# Patient Record
Sex: Female | Born: 1935 | Race: White | Hispanic: No | Marital: Married | Smoking: Never smoker
Health system: Southern US, Community
[De-identification: ages and names within clinical notes are randomized; demographics above are authoritative.]

## PROBLEM LIST (undated history)

## (undated) DIAGNOSIS — T7840XA Allergy, unspecified, initial encounter: Secondary | ICD-10-CM

## (undated) DIAGNOSIS — D649 Anemia, unspecified: Secondary | ICD-10-CM

## (undated) DIAGNOSIS — C50919 Malignant neoplasm of unspecified site of unspecified female breast: Secondary | ICD-10-CM

## (undated) DIAGNOSIS — Z923 Personal history of irradiation: Secondary | ICD-10-CM

## (undated) DIAGNOSIS — I10 Essential (primary) hypertension: Secondary | ICD-10-CM

## (undated) DIAGNOSIS — M8580 Other specified disorders of bone density and structure, unspecified site: Secondary | ICD-10-CM

## (undated) DIAGNOSIS — C801 Malignant (primary) neoplasm, unspecified: Secondary | ICD-10-CM

## (undated) DIAGNOSIS — K219 Gastro-esophageal reflux disease without esophagitis: Secondary | ICD-10-CM

## (undated) DIAGNOSIS — I5181 Takotsubo syndrome: Secondary | ICD-10-CM

## (undated) DIAGNOSIS — H3530 Unspecified macular degeneration: Secondary | ICD-10-CM

## (undated) DIAGNOSIS — E785 Hyperlipidemia, unspecified: Secondary | ICD-10-CM

## (undated) DIAGNOSIS — I219 Acute myocardial infarction, unspecified: Secondary | ICD-10-CM

## (undated) DIAGNOSIS — Z9289 Personal history of other medical treatment: Secondary | ICD-10-CM

## (undated) HISTORY — PX: APPENDECTOMY: SHX54

## (undated) HISTORY — DX: Allergy, unspecified, initial encounter: T78.40XA

## (undated) HISTORY — DX: Unspecified macular degeneration: H35.30

## (undated) HISTORY — DX: Essential (primary) hypertension: I10

## (undated) HISTORY — DX: Other specified disorders of bone density and structure, unspecified site: M85.80

## (undated) HISTORY — PX: CATARACT EXTRACTION: SUR2

## (undated) HISTORY — PX: EYE SURGERY: SHX253

## (undated) HISTORY — DX: Hyperlipidemia, unspecified: E78.5

## (undated) HISTORY — PX: RETINAL LASER PROCEDURE: SHX2339

## (undated) HISTORY — DX: Malignant (primary) neoplasm, unspecified: C80.1

## (undated) HISTORY — DX: Gastro-esophageal reflux disease without esophagitis: K21.9

---

## 1982-03-31 DIAGNOSIS — Z9289 Personal history of other medical treatment: Secondary | ICD-10-CM

## 1982-03-31 DIAGNOSIS — D649 Anemia, unspecified: Secondary | ICD-10-CM

## 1982-03-31 HISTORY — PX: ABDOMINAL HYSTERECTOMY: SHX81

## 1982-03-31 HISTORY — DX: Personal history of other medical treatment: Z92.89

## 1982-03-31 HISTORY — DX: Anemia, unspecified: D64.9

## 2000-03-04 ENCOUNTER — Encounter: Admission: RE | Admit: 2000-03-04 | Discharge: 2000-03-04 | Payer: Self-pay | Admitting: Family Medicine

## 2000-03-04 ENCOUNTER — Encounter: Payer: Self-pay | Admitting: Family Medicine

## 2001-03-05 ENCOUNTER — Encounter: Admission: RE | Admit: 2001-03-05 | Discharge: 2001-03-05 | Payer: Self-pay | Admitting: Family Medicine

## 2001-03-05 ENCOUNTER — Encounter: Payer: Self-pay | Admitting: Family Medicine

## 2002-03-07 ENCOUNTER — Encounter: Admission: RE | Admit: 2002-03-07 | Discharge: 2002-03-07 | Payer: Self-pay | Admitting: Family Medicine

## 2002-03-07 ENCOUNTER — Encounter: Payer: Self-pay | Admitting: Family Medicine

## 2003-03-10 ENCOUNTER — Encounter: Admission: RE | Admit: 2003-03-10 | Discharge: 2003-03-10 | Payer: Self-pay | Admitting: Family Medicine

## 2003-10-07 ENCOUNTER — Emergency Department (HOSPITAL_COMMUNITY): Admission: EM | Admit: 2003-10-07 | Discharge: 2003-10-07 | Payer: Self-pay | Admitting: Emergency Medicine

## 2003-10-09 ENCOUNTER — Emergency Department (HOSPITAL_COMMUNITY): Admission: EM | Admit: 2003-10-09 | Discharge: 2003-10-09 | Payer: Self-pay | Admitting: Emergency Medicine

## 2004-03-15 ENCOUNTER — Encounter: Admission: RE | Admit: 2004-03-15 | Discharge: 2004-03-15 | Payer: Self-pay | Admitting: Family Medicine

## 2004-03-27 ENCOUNTER — Encounter: Admission: RE | Admit: 2004-03-27 | Discharge: 2004-03-27 | Payer: Self-pay | Admitting: Family Medicine

## 2004-03-27 ENCOUNTER — Encounter (INDEPENDENT_AMBULATORY_CARE_PROVIDER_SITE_OTHER): Payer: Self-pay | Admitting: *Deleted

## 2004-03-31 HISTORY — PX: BREAST BIOPSY: SHX20

## 2004-04-02 ENCOUNTER — Encounter: Admission: RE | Admit: 2004-04-02 | Discharge: 2004-04-02 | Payer: Self-pay | Admitting: General Surgery

## 2004-04-17 ENCOUNTER — Encounter: Admission: RE | Admit: 2004-04-17 | Discharge: 2004-04-17 | Payer: Self-pay | Admitting: General Surgery

## 2004-04-19 ENCOUNTER — Ambulatory Visit (HOSPITAL_BASED_OUTPATIENT_CLINIC_OR_DEPARTMENT_OTHER): Admission: RE | Admit: 2004-04-19 | Discharge: 2004-04-19 | Payer: Self-pay | Admitting: General Surgery

## 2004-04-19 ENCOUNTER — Ambulatory Visit (HOSPITAL_COMMUNITY): Admission: RE | Admit: 2004-04-19 | Discharge: 2004-04-19 | Payer: Self-pay | Admitting: General Surgery

## 2004-04-19 ENCOUNTER — Encounter: Admission: RE | Admit: 2004-04-19 | Discharge: 2004-04-19 | Payer: Self-pay | Admitting: General Surgery

## 2004-04-19 ENCOUNTER — Encounter (INDEPENDENT_AMBULATORY_CARE_PROVIDER_SITE_OTHER): Payer: Self-pay | Admitting: Specialist

## 2004-04-19 HISTORY — PX: BREAST LUMPECTOMY: SHX2

## 2004-04-22 ENCOUNTER — Ambulatory Visit: Payer: Self-pay | Admitting: Oncology

## 2004-05-01 ENCOUNTER — Ambulatory Visit: Admission: RE | Admit: 2004-05-01 | Discharge: 2004-07-11 | Payer: Self-pay | Admitting: *Deleted

## 2004-06-25 ENCOUNTER — Ambulatory Visit: Payer: Self-pay | Admitting: Oncology

## 2004-07-19 ENCOUNTER — Other Ambulatory Visit: Admission: RE | Admit: 2004-07-19 | Discharge: 2004-07-19 | Payer: Self-pay | Admitting: Family Medicine

## 2004-07-24 ENCOUNTER — Ambulatory Visit: Admission: RE | Admit: 2004-07-24 | Discharge: 2004-07-24 | Payer: Self-pay | Admitting: *Deleted

## 2004-08-27 ENCOUNTER — Ambulatory Visit: Payer: Self-pay | Admitting: Oncology

## 2004-09-24 ENCOUNTER — Encounter: Admission: RE | Admit: 2004-09-24 | Discharge: 2004-09-24 | Payer: Self-pay | Admitting: Oncology

## 2004-12-30 ENCOUNTER — Ambulatory Visit: Payer: Self-pay | Admitting: Oncology

## 2005-03-31 HISTORY — PX: BREAST LUMPECTOMY: SHX2

## 2005-04-02 ENCOUNTER — Encounter: Admission: RE | Admit: 2005-04-02 | Discharge: 2005-04-02 | Payer: Self-pay | Admitting: Family Medicine

## 2005-04-02 ENCOUNTER — Encounter (INDEPENDENT_AMBULATORY_CARE_PROVIDER_SITE_OTHER): Payer: Self-pay | Admitting: *Deleted

## 2005-04-15 ENCOUNTER — Encounter: Admission: RE | Admit: 2005-04-15 | Discharge: 2005-04-15 | Payer: Self-pay | Admitting: General Surgery

## 2005-04-29 ENCOUNTER — Encounter: Admission: RE | Admit: 2005-04-29 | Discharge: 2005-04-29 | Payer: Self-pay | Admitting: Family Medicine

## 2005-04-29 ENCOUNTER — Encounter (INDEPENDENT_AMBULATORY_CARE_PROVIDER_SITE_OTHER): Payer: Self-pay | Admitting: Specialist

## 2005-04-29 ENCOUNTER — Ambulatory Visit (HOSPITAL_BASED_OUTPATIENT_CLINIC_OR_DEPARTMENT_OTHER): Admission: RE | Admit: 2005-04-29 | Discharge: 2005-04-29 | Payer: Self-pay | Admitting: General Surgery

## 2005-04-29 ENCOUNTER — Encounter (INDEPENDENT_AMBULATORY_CARE_PROVIDER_SITE_OTHER): Payer: Self-pay | Admitting: General Surgery

## 2005-04-29 HISTORY — PX: BREAST BIOPSY: SHX20

## 2005-05-13 ENCOUNTER — Ambulatory Visit: Admission: RE | Admit: 2005-05-13 | Discharge: 2005-08-01 | Payer: Self-pay | Admitting: *Deleted

## 2005-05-30 ENCOUNTER — Encounter: Admission: RE | Admit: 2005-05-30 | Discharge: 2005-05-30 | Payer: Self-pay | Admitting: *Deleted

## 2005-06-24 ENCOUNTER — Ambulatory Visit: Payer: Self-pay | Admitting: Oncology

## 2005-07-08 ENCOUNTER — Encounter: Admission: RE | Admit: 2005-07-08 | Discharge: 2005-07-08 | Payer: Self-pay | Admitting: Family Medicine

## 2005-08-24 ENCOUNTER — Ambulatory Visit: Payer: Self-pay | Admitting: Oncology

## 2005-12-19 ENCOUNTER — Ambulatory Visit: Payer: Self-pay | Admitting: Oncology

## 2005-12-23 LAB — CBC WITH DIFFERENTIAL/PLATELET
Basophils Absolute: 0 10*3/uL (ref 0.0–0.1)
Eosinophils Absolute: 0 10*3/uL (ref 0.0–0.5)
HCT: 36.7 % (ref 34.8–46.6)
HGB: 12.9 g/dL (ref 11.6–15.9)
MCH: 33.6 pg (ref 26.0–34.0)
MONO#: 0.3 10*3/uL (ref 0.1–0.9)
NEUT%: 71.9 % (ref 39.6–76.8)
WBC: 5.7 10*3/uL (ref 3.9–10.0)
lymph#: 1.2 10*3/uL (ref 0.9–3.3)

## 2005-12-23 LAB — COMPREHENSIVE METABOLIC PANEL
BUN: 11 mg/dL (ref 6–23)
CO2: 28 mEq/L (ref 19–32)
Calcium: 9.4 mg/dL (ref 8.4–10.5)
Chloride: 97 mEq/L (ref 96–112)
Creatinine, Ser: 0.68 mg/dL (ref 0.40–1.20)
Glucose, Bld: 127 mg/dL — ABNORMAL HIGH (ref 70–99)

## 2005-12-23 LAB — CANCER ANTIGEN 27.29: CA 27.29: 29 U/mL (ref 0–39)

## 2006-03-18 ENCOUNTER — Encounter: Admission: RE | Admit: 2006-03-18 | Discharge: 2006-03-18 | Payer: Self-pay | Admitting: Family Medicine

## 2006-06-30 ENCOUNTER — Ambulatory Visit (HOSPITAL_COMMUNITY): Admission: RE | Admit: 2006-06-30 | Discharge: 2006-06-30 | Payer: Self-pay | Admitting: Oncology

## 2006-07-31 ENCOUNTER — Ambulatory Visit: Payer: Self-pay | Admitting: Oncology

## 2006-08-04 LAB — COMPREHENSIVE METABOLIC PANEL
AST: 15 U/L (ref 0–37)
Albumin: 4.4 g/dL (ref 3.5–5.2)
BUN: 16 mg/dL (ref 6–23)
Calcium: 9.6 mg/dL (ref 8.4–10.5)
Chloride: 98 mEq/L (ref 96–112)
Potassium: 4.1 mEq/L (ref 3.5–5.3)
Sodium: 137 mEq/L (ref 135–145)
Total Protein: 7.3 g/dL (ref 6.0–8.3)

## 2006-08-04 LAB — CBC WITH DIFFERENTIAL/PLATELET
Basophils Absolute: 0 10*3/uL (ref 0.0–0.1)
EOS%: 0.9 % (ref 0.0–7.0)
Eosinophils Absolute: 0.1 10*3/uL (ref 0.0–0.5)
HGB: 13.2 g/dL (ref 11.6–15.9)
MCH: 34.1 pg — ABNORMAL HIGH (ref 26.0–34.0)
NEUT#: 3.5 10*3/uL (ref 1.5–6.5)
RBC: 3.87 10*6/uL (ref 3.70–5.32)
RDW: 12.6 % (ref 11.3–14.5)
lymph#: 1.5 10*3/uL (ref 0.9–3.3)

## 2007-03-29 ENCOUNTER — Encounter: Admission: RE | Admit: 2007-03-29 | Discharge: 2007-03-29 | Payer: Self-pay | Admitting: Family Medicine

## 2007-07-30 ENCOUNTER — Ambulatory Visit: Payer: Self-pay | Admitting: Oncology

## 2007-08-03 LAB — COMPREHENSIVE METABOLIC PANEL
AST: 17 U/L (ref 0–37)
BUN: 21 mg/dL (ref 6–23)
Calcium: 9.9 mg/dL (ref 8.4–10.5)
Chloride: 94 mEq/L — ABNORMAL LOW (ref 96–112)
Creatinine, Ser: 0.8 mg/dL (ref 0.40–1.20)

## 2007-08-03 LAB — CBC WITH DIFFERENTIAL/PLATELET
BASO%: 0.3 % (ref 0.0–2.0)
HCT: 40.3 % (ref 34.8–46.6)
LYMPH%: 13.3 % — ABNORMAL LOW (ref 14.0–48.0)
MCH: 33.3 pg (ref 26.0–34.0)
MCHC: 33.9 g/dL (ref 32.0–36.0)
MCV: 98.2 fL (ref 81.0–101.0)
MONO#: 0.6 10*3/uL (ref 0.1–0.9)
NEUT%: 81.4 % — ABNORMAL HIGH (ref 39.6–76.8)
Platelets: 339 10*3/uL (ref 145–400)
WBC: 13.4 10*3/uL — ABNORMAL HIGH (ref 3.9–10.0)

## 2008-03-29 ENCOUNTER — Encounter: Admission: RE | Admit: 2008-03-29 | Discharge: 2008-03-29 | Payer: Self-pay | Admitting: Oncology

## 2008-08-04 ENCOUNTER — Ambulatory Visit: Payer: Self-pay | Admitting: Oncology

## 2008-08-08 LAB — CBC WITH DIFFERENTIAL/PLATELET
EOS%: 0.8 % (ref 0.0–7.0)
MCH: 33.5 pg (ref 25.1–34.0)
MCV: 96.8 fL (ref 79.5–101.0)
MONO%: 6.1 % (ref 0.0–14.0)
NEUT#: 4.7 10*3/uL (ref 1.5–6.5)
RBC: 4.05 10*6/uL (ref 3.70–5.45)
RDW: 12.6 % (ref 11.2–14.5)
lymph#: 1.6 10*3/uL (ref 0.9–3.3)

## 2008-08-09 LAB — COMPREHENSIVE METABOLIC PANEL
ALT: 22 U/L (ref 0–35)
AST: 20 U/L (ref 0–37)
Albumin: 4.3 g/dL (ref 3.5–5.2)
Alkaline Phosphatase: 77 U/L (ref 39–117)
Potassium: 3.8 mEq/L (ref 3.5–5.3)
Sodium: 139 mEq/L (ref 135–145)
Total Protein: 6.9 g/dL (ref 6.0–8.3)

## 2009-04-02 ENCOUNTER — Encounter: Admission: RE | Admit: 2009-04-02 | Discharge: 2009-04-02 | Payer: Self-pay | Admitting: Oncology

## 2009-08-07 ENCOUNTER — Ambulatory Visit: Payer: Self-pay | Admitting: Oncology

## 2009-08-08 LAB — COMPREHENSIVE METABOLIC PANEL
ALT: 19 U/L (ref 0–35)
CO2: 25 mEq/L (ref 19–32)
Calcium: 9.6 mg/dL (ref 8.4–10.5)
Chloride: 96 mEq/L (ref 96–112)
Potassium: 3.5 mEq/L (ref 3.5–5.3)
Sodium: 136 mEq/L (ref 135–145)
Total Protein: 7.1 g/dL (ref 6.0–8.3)

## 2009-08-08 LAB — CBC WITH DIFFERENTIAL/PLATELET
BASO%: 0.4 % (ref 0.0–2.0)
MCHC: 34.4 g/dL (ref 31.5–36.0)
MONO#: 0.5 10*3/uL (ref 0.1–0.9)
RBC: 3.84 10*6/uL (ref 3.70–5.45)
WBC: 9.4 10*3/uL (ref 3.9–10.3)
lymph#: 1.8 10*3/uL (ref 0.9–3.3)

## 2009-08-08 LAB — CANCER ANTIGEN 27.29: CA 27.29: 29 U/mL (ref 0–39)

## 2009-10-22 ENCOUNTER — Emergency Department (HOSPITAL_COMMUNITY): Admission: EM | Admit: 2009-10-22 | Discharge: 2009-10-23 | Payer: Self-pay | Admitting: Emergency Medicine

## 2009-11-22 ENCOUNTER — Encounter: Admission: RE | Admit: 2009-11-22 | Discharge: 2009-11-22 | Payer: Self-pay | Admitting: Family Medicine

## 2010-04-08 ENCOUNTER — Encounter
Admission: RE | Admit: 2010-04-08 | Discharge: 2010-04-08 | Payer: No Typology Code available for payment source | Source: Home / Self Care | Attending: Family Medicine | Admitting: Family Medicine

## 2010-06-15 LAB — DIFFERENTIAL
Eosinophils Absolute: 0.1 10*3/uL (ref 0.0–0.7)
Lymphocytes Relative: 28 % (ref 12–46)
Lymphs Abs: 1.9 10*3/uL (ref 0.7–4.0)
Monocytes Relative: 7 % (ref 3–12)
Neutro Abs: 4.4 10*3/uL (ref 1.7–7.7)
Neutrophils Relative %: 64 % (ref 43–77)

## 2010-06-15 LAB — COMPREHENSIVE METABOLIC PANEL
ALT: 17 U/L (ref 0–35)
Calcium: 9.4 mg/dL (ref 8.4–10.5)
GFR calc Af Amer: >60 mL/min (ref >60)
Glucose, Bld: 99 mg/dL (ref 70–99)
Sodium: 138 mEq/L (ref 135–145)
Total Protein: 6.7 g/dL (ref 6.0–8.3)

## 2010-06-15 LAB — CBC
HCT: 35.5 % — ABNORMAL LOW (ref 36.0–46.0)
MCHC: 35 g/dL (ref 30.0–36.0)
RDW: 12.7 % (ref 11.5–15.5)

## 2010-06-15 LAB — MAGNESIUM: Magnesium: 1.8 mg/dL (ref 1.5–2.5)

## 2010-08-06 ENCOUNTER — Other Ambulatory Visit: Payer: Self-pay | Admitting: Oncology

## 2010-08-06 ENCOUNTER — Encounter (HOSPITAL_BASED_OUTPATIENT_CLINIC_OR_DEPARTMENT_OTHER): Payer: Medicare Other | Admitting: Oncology

## 2010-08-06 DIAGNOSIS — Z170 Estrogen receptor positive status [ER+]: Secondary | ICD-10-CM

## 2010-08-06 DIAGNOSIS — M949 Disorder of cartilage, unspecified: Secondary | ICD-10-CM

## 2010-08-06 DIAGNOSIS — C50219 Malignant neoplasm of upper-inner quadrant of unspecified female breast: Secondary | ICD-10-CM

## 2010-08-06 LAB — COMPREHENSIVE METABOLIC PANEL
ALT: 17 U/L (ref 0–35)
CO2: 32 mEq/L (ref 19–32)
Calcium: 10.1 mg/dL (ref 8.4–10.5)
Chloride: 98 mEq/L (ref 96–112)
Creatinine, Ser: 0.84 mg/dL (ref 0.40–1.20)
Glucose, Bld: 111 mg/dL — ABNORMAL HIGH (ref 70–99)
Total Protein: 7.1 g/dL (ref 6.0–8.3)

## 2010-08-06 LAB — CBC WITH DIFFERENTIAL/PLATELET
BASO%: 0.5 % (ref 0.0–2.0)
Eosinophils Absolute: 0 10*3/uL (ref 0.0–0.5)
HCT: 36.8 % (ref 34.8–46.6)
HGB: 12.5 g/dL (ref 11.6–15.9)
LYMPH%: 24.3 % (ref 14.0–49.7)
MCHC: 34 g/dL (ref 31.5–36.0)
MONO#: 0.4 10*3/uL (ref 0.1–0.9)
NEUT#: 4.4 10*3/uL (ref 1.5–6.5)
NEUT%: 67.7 % (ref 38.4–76.8)
Platelets: 249 10*3/uL (ref 145–400)
WBC: 6.5 10*3/uL (ref 3.9–10.3)
lymph#: 1.6 10*3/uL (ref 0.9–3.3)

## 2010-08-07 LAB — CANCER ANTIGEN 27.29: CA 27.29: 23 U/mL (ref 0–39)

## 2010-08-07 LAB — VITAMIN D 25 HYDROXY (VIT D DEFICIENCY, FRACTURES): Vit D, 25-Hydroxy: 54 ng/mL (ref 30–89)

## 2010-08-13 ENCOUNTER — Encounter (HOSPITAL_BASED_OUTPATIENT_CLINIC_OR_DEPARTMENT_OTHER): Payer: Medicare Other | Admitting: Oncology

## 2010-08-13 DIAGNOSIS — C50219 Malignant neoplasm of upper-inner quadrant of unspecified female breast: Secondary | ICD-10-CM

## 2010-08-13 DIAGNOSIS — Z170 Estrogen receptor positive status [ER+]: Secondary | ICD-10-CM

## 2010-08-13 DIAGNOSIS — D0590 Unspecified type of carcinoma in situ of unspecified breast: Secondary | ICD-10-CM

## 2010-08-16 NOTE — Op Note (Signed)
[name], [name]           ACCOUNT NO.:  [id number]   MEDICAL RECORD NO.:  [id number]          PATIENT TYPE:  AMB   LOCATION:  DSC                          FACILITY:  [facility]   PHYSICIAN:  Rose Phi. Young, M.D.   DATE OF BIRTH:  01-09-1935   DATE OF PROCEDURE:  04/29/2005  DATE OF DISCHARGE:                                 OPERATIVE REPORT   PREOPERATIVE DIAGNOSIS:  Atypical ductal hyperplasia of the right breast and  a previous history of left breast cancer.   POSTOPERATIVE DIAGNOSIS:  Atypical ductal hyperplasia of the right breast  and a previous history of left breast cancer.   OPERATION:  Right breast biopsy with needle localization and specimen  mammogram.   SURGEON:  Rose Phi. Maple Hudson, M.D.   ANESTHESIA:  MAC.   OPERATIVE PROCEDURE:  Prior to coming to the operating room, a localizing  wire had been placed in the upper outer quadrant of the right breast.  She  was then placed on the operating table with her arms extended on the arm  board and the right breast prepped and draped in the usual fashion.  A  curvilinear incision was outlined with the marking pencil and the area  thoroughly infiltrated with the local anesthetic mixture.  The incision was  made with the wire delivered into the incision and then the wire and  surrounding tissue were excised.  Specimen mammogram confirmed the removal  of the lesion.  Hemostasis was obtained with the cautery.  Subcuticular  closure of 4-0 Monocryl and Steri-Strips carried out.  Dressing applied.  The patient transferred to the recovery room in good condition.      Rose Phi. Maple Hudson, M.D.  Electronically Signed     PRY/MEDQ  D:  04/29/2005  T:  04/29/2005  Job:  [id number]

## 2010-08-16 NOTE — Op Note (Signed)
Melinda [name], Melinda [name]           ACCOUNT NO.:  [id number]   MEDICAL RECORD NO.:  [id number]          PATIENT TYPE:  AMB   LOCATION:  DSC                          FACILITY:  [facility]   PHYSICIAN:  Rose Phi. Maple Hudson, M.D.   DATE OF BIRTH:  1934/04/15   DATE OF PROCEDURE:  04/19/2004  DATE OF DISCHARGE:                                 OPERATIVE REPORT   PREOPERATIVE DIAGNOSIS:  Stage 1 carcinoma of the left breast.   POSTOPERATIVE DIAGNOSIS:  Stage 1 carcinoma of the left breast.   OPERATION:  1.  Blue dye injection.  2.  Left partial mastectomy with needle localization and specimen mammogram.  3.  Left axillary sentinel node biopsy.   SURGEON:  Rose Phi. Maple Hudson, M.D.   ANESTHESIA:  General.   OPERATIVE PROCEDURE:  Prior to coming to the operating room a localizing  wire was placed to identify the known malignancy throughout blocked position  of the left breast.  In addition, 1 mc of technetium sulfur colloid was  injected intradermally.   After suitable general anesthesia was induced, the patient was placed in a  supine position with the arms extended on the arm board.  Then 5 cc of a  mixture of 2 cc methylene blue and 3 cc of saline was injected in the  subareolar tissue, and the breast gently massaged for three minutes.  We  then prepped and draped the breast and the axilla.   A curved incision in the upper part of the breast, using the previously  placed wire as a guide, was then made.  A wide excision of the wire and  surrounding tissue was carried out.  Hemostasis was obtained with  electrocautery.  Specimen was oriented for the pathologist and then  submitted for a specimen mammogram, to be followed by evaluation for  margins.   While that was being done, a transverse left axillary incision was made,  with dissection through the subcutaneous tissue to the clavipectoral fascia.  Deep to the fascia was a blue and hot lymph node, which was very high  counts; which we removed  as the sentinel node.  There were no other blue,  hot or palpable nodes.  That node was also submitted to the pathologist for  evaluation for metastatic disease.   The specimen mammogram confirmed the removal of the lesion and touch prep on  the margins of the specimen were negative.  In addition, touch prep for the  sentinel node was also negative.   With that information, both incisions were injected with 0.25% Marcaine, and  then closed in two layers with 3-0 Vicryl and subcuticular 4-0 Monocryl and  Steri-Strips.  Dressings were then applied.   The patient transferred to the recovery room in satisfactory condition,  having tolerated the procedure well.      Pete   PRY/MEDQ  D:  04/19/2004  T:  04/20/2004  Job:  [id number]

## 2010-08-23 ENCOUNTER — Encounter (INDEPENDENT_AMBULATORY_CARE_PROVIDER_SITE_OTHER): Payer: Self-pay | Admitting: Surgery

## 2011-01-27 ENCOUNTER — Encounter (INDEPENDENT_AMBULATORY_CARE_PROVIDER_SITE_OTHER): Payer: Self-pay | Admitting: Surgery

## 2011-02-19 ENCOUNTER — Encounter (INDEPENDENT_AMBULATORY_CARE_PROVIDER_SITE_OTHER): Payer: Self-pay | Admitting: General Surgery

## 2011-02-19 DIAGNOSIS — Z853 Personal history of malignant neoplasm of breast: Secondary | ICD-10-CM | POA: Insufficient documentation

## 2011-02-27 ENCOUNTER — Ambulatory Visit (INDEPENDENT_AMBULATORY_CARE_PROVIDER_SITE_OTHER): Payer: Medicare Other | Admitting: Surgery

## 2011-02-27 ENCOUNTER — Encounter (INDEPENDENT_AMBULATORY_CARE_PROVIDER_SITE_OTHER): Payer: Self-pay | Admitting: Surgery

## 2011-02-27 VITALS — BP 128/74 | HR 100 | Temp 97.2°F | Resp 16 | Ht 62.0 in | Wt 192.2 lb

## 2011-02-27 DIAGNOSIS — Z853 Personal history of malignant neoplasm of breast: Secondary | ICD-10-CM

## 2011-02-27 NOTE — Patient Instructions (Signed)
Come back if you have any problems with your breasts. Continue annual mammograms

## 2011-02-27 NOTE — Progress Notes (Signed)
NAME: Melinda [name]       DOB: Feb 21, 1935           DATE: 02/27/2011       MRN: [id number]   Melinda [name] is a 76 y.o.Marland Kitchenfemale who presents for routine followup of her Bilateral breast cancers diagnosed in 2006  and treated with Lumpectomies, radiation and antiestrogen. She has no problems or concerns on either side.  PFSH: She has had no significant changes since the last visit here.  ROS: There have been no significant changes since the last visit here  EXAM: General: The patient is alert, oriented, generally healty appearing, NAD. Mood and affect are normal.  Breasts:  S/P bilateral lumpectomies with some loss of tissue at the lumpectomy sites, but no areas of suspicion  Lymphatics: She has no axillary or supraclavicular adenopathy on either side.  Extremities: Full ROM of the surgical side with no lymphedema noted.  Data Reviewed: Mammogram in Jan 2012 was negative  Impression: Doing well, with no evidence of recurrent cancer or new cancer  Plan: RTC PRN

## 2011-03-13 ENCOUNTER — Other Ambulatory Visit: Payer: Self-pay | Admitting: Family Medicine

## 2011-03-13 DIAGNOSIS — C50919 Malignant neoplasm of unspecified site of unspecified female breast: Secondary | ICD-10-CM

## 2011-03-13 DIAGNOSIS — Z9889 Other specified postprocedural states: Secondary | ICD-10-CM

## 2011-04-02 DIAGNOSIS — IMO0002 Reserved for concepts with insufficient information to code with codable children: Secondary | ICD-10-CM | POA: Diagnosis not present

## 2011-04-08 DIAGNOSIS — IMO0002 Reserved for concepts with insufficient information to code with codable children: Secondary | ICD-10-CM | POA: Diagnosis not present

## 2011-04-10 ENCOUNTER — Ambulatory Visit
Admission: RE | Admit: 2011-04-10 | Discharge: 2011-04-10 | Disposition: A | Discharge status: Home / Self Care | Payer: Medicare Other | Source: Ambulatory Visit | Attending: Family Medicine | Admitting: Family Medicine

## 2011-04-10 DIAGNOSIS — C50919 Malignant neoplasm of unspecified site of unspecified female breast: Secondary | ICD-10-CM

## 2011-04-10 DIAGNOSIS — Z853 Personal history of malignant neoplasm of breast: Secondary | ICD-10-CM | POA: Diagnosis not present

## 2011-04-10 DIAGNOSIS — Z9889 Other specified postprocedural states: Secondary | ICD-10-CM

## 2011-04-15 DIAGNOSIS — IMO0002 Reserved for concepts with insufficient information to code with codable children: Secondary | ICD-10-CM | POA: Diagnosis not present

## 2011-04-21 DIAGNOSIS — IMO0002 Reserved for concepts with insufficient information to code with codable children: Secondary | ICD-10-CM | POA: Diagnosis not present

## 2011-04-22 DIAGNOSIS — M25569 Pain in unspecified knee: Secondary | ICD-10-CM | POA: Diagnosis not present

## 2011-04-22 DIAGNOSIS — M1710 Unilateral primary osteoarthritis, unspecified knee: Secondary | ICD-10-CM | POA: Diagnosis not present

## 2011-04-28 DIAGNOSIS — IMO0002 Reserved for concepts with insufficient information to code with codable children: Secondary | ICD-10-CM | POA: Diagnosis not present

## 2011-04-30 DIAGNOSIS — H353290 Exudative age-related macular degeneration, unspecified eye, stage unspecified: Secondary | ICD-10-CM | POA: Diagnosis not present

## 2011-04-30 DIAGNOSIS — H35059 Retinal neovascularization, unspecified, unspecified eye: Secondary | ICD-10-CM | POA: Diagnosis not present

## 2011-05-20 DIAGNOSIS — J209 Acute bronchitis, unspecified: Secondary | ICD-10-CM | POA: Diagnosis not present

## 2011-06-05 DIAGNOSIS — H353290 Exudative age-related macular degeneration, unspecified eye, stage unspecified: Secondary | ICD-10-CM | POA: Diagnosis not present

## 2011-06-05 DIAGNOSIS — H35059 Retinal neovascularization, unspecified, unspecified eye: Secondary | ICD-10-CM | POA: Diagnosis not present

## 2011-07-08 DIAGNOSIS — H353290 Exudative age-related macular degeneration, unspecified eye, stage unspecified: Secondary | ICD-10-CM | POA: Diagnosis not present

## 2011-07-08 DIAGNOSIS — H35059 Retinal neovascularization, unspecified, unspecified eye: Secondary | ICD-10-CM | POA: Diagnosis not present

## 2011-08-13 DIAGNOSIS — H353290 Exudative age-related macular degeneration, unspecified eye, stage unspecified: Secondary | ICD-10-CM | POA: Diagnosis not present

## 2011-08-13 DIAGNOSIS — H35059 Retinal neovascularization, unspecified, unspecified eye: Secondary | ICD-10-CM | POA: Diagnosis not present

## 2011-08-22 DIAGNOSIS — M5430 Sciatica, unspecified side: Secondary | ICD-10-CM | POA: Diagnosis not present

## 2011-08-22 DIAGNOSIS — D2330 Other benign neoplasm of skin of unspecified part of face: Secondary | ICD-10-CM | POA: Diagnosis not present

## 2011-08-22 DIAGNOSIS — I10 Essential (primary) hypertension: Secondary | ICD-10-CM | POA: Diagnosis not present

## 2011-08-22 DIAGNOSIS — E7800 Pure hypercholesterolemia, unspecified: Secondary | ICD-10-CM | POA: Diagnosis not present

## 2011-09-17 DIAGNOSIS — H353290 Exudative age-related macular degeneration, unspecified eye, stage unspecified: Secondary | ICD-10-CM | POA: Diagnosis not present

## 2011-09-17 DIAGNOSIS — H35059 Retinal neovascularization, unspecified, unspecified eye: Secondary | ICD-10-CM | POA: Diagnosis not present

## 2011-10-06 DIAGNOSIS — H353190 Nonexudative age-related macular degeneration, unspecified eye, stage unspecified: Secondary | ICD-10-CM | POA: Diagnosis not present

## 2011-10-06 DIAGNOSIS — H35359 Cystoid macular degeneration, unspecified eye: Secondary | ICD-10-CM | POA: Diagnosis not present

## 2011-10-06 DIAGNOSIS — H35039 Hypertensive retinopathy, unspecified eye: Secondary | ICD-10-CM | POA: Diagnosis not present

## 2011-10-06 DIAGNOSIS — H4011X0 Primary open-angle glaucoma, stage unspecified: Secondary | ICD-10-CM | POA: Diagnosis not present

## 2011-10-06 DIAGNOSIS — H409 Unspecified glaucoma: Secondary | ICD-10-CM | POA: Diagnosis not present

## 2011-10-23 DIAGNOSIS — H353290 Exudative age-related macular degeneration, unspecified eye, stage unspecified: Secondary | ICD-10-CM | POA: Diagnosis not present

## 2011-10-23 DIAGNOSIS — H35059 Retinal neovascularization, unspecified, unspecified eye: Secondary | ICD-10-CM | POA: Diagnosis not present

## 2011-11-12 DIAGNOSIS — H01119 Allergic dermatitis of unspecified eye, unspecified eyelid: Secondary | ICD-10-CM | POA: Diagnosis not present

## 2011-11-26 DIAGNOSIS — H01119 Allergic dermatitis of unspecified eye, unspecified eyelid: Secondary | ICD-10-CM | POA: Diagnosis not present

## 2011-12-04 DIAGNOSIS — H353290 Exudative age-related macular degeneration, unspecified eye, stage unspecified: Secondary | ICD-10-CM | POA: Diagnosis not present

## 2011-12-04 DIAGNOSIS — H35059 Retinal neovascularization, unspecified, unspecified eye: Secondary | ICD-10-CM | POA: Diagnosis not present

## 2011-12-25 DIAGNOSIS — M1710 Unilateral primary osteoarthritis, unspecified knee: Secondary | ICD-10-CM | POA: Diagnosis not present

## 2011-12-25 DIAGNOSIS — M25559 Pain in unspecified hip: Secondary | ICD-10-CM | POA: Diagnosis not present

## 2011-12-25 DIAGNOSIS — M25569 Pain in unspecified knee: Secondary | ICD-10-CM | POA: Diagnosis not present

## 2012-01-14 DIAGNOSIS — H35059 Retinal neovascularization, unspecified, unspecified eye: Secondary | ICD-10-CM | POA: Diagnosis not present

## 2012-01-14 DIAGNOSIS — H353290 Exudative age-related macular degeneration, unspecified eye, stage unspecified: Secondary | ICD-10-CM | POA: Diagnosis not present

## 2012-01-15 DIAGNOSIS — Z23 Encounter for immunization: Secondary | ICD-10-CM | POA: Diagnosis not present

## 2012-02-10 ENCOUNTER — Other Ambulatory Visit: Payer: Self-pay

## 2012-02-10 DIAGNOSIS — D485 Neoplasm of uncertain behavior of skin: Secondary | ICD-10-CM | POA: Diagnosis not present

## 2012-02-10 DIAGNOSIS — D239 Other benign neoplasm of skin, unspecified: Secondary | ICD-10-CM | POA: Diagnosis not present

## 2012-02-10 DIAGNOSIS — D1801 Hemangioma of skin and subcutaneous tissue: Secondary | ICD-10-CM | POA: Diagnosis not present

## 2012-02-10 DIAGNOSIS — L719 Rosacea, unspecified: Secondary | ICD-10-CM | POA: Diagnosis not present

## 2012-02-10 DIAGNOSIS — D1739 Benign lipomatous neoplasm of skin and subcutaneous tissue of other sites: Secondary | ICD-10-CM | POA: Diagnosis not present

## 2012-02-10 DIAGNOSIS — L821 Other seborrheic keratosis: Secondary | ICD-10-CM | POA: Diagnosis not present

## 2012-02-10 DIAGNOSIS — B353 Tinea pedis: Secondary | ICD-10-CM | POA: Diagnosis not present

## 2012-02-10 DIAGNOSIS — B351 Tinea unguium: Secondary | ICD-10-CM | POA: Diagnosis not present

## 2012-02-18 DIAGNOSIS — H353290 Exudative age-related macular degeneration, unspecified eye, stage unspecified: Secondary | ICD-10-CM | POA: Diagnosis not present

## 2012-02-18 DIAGNOSIS — H3570 Unspecified separation of retinal layers: Secondary | ICD-10-CM | POA: Diagnosis not present

## 2012-02-18 DIAGNOSIS — H35059 Retinal neovascularization, unspecified, unspecified eye: Secondary | ICD-10-CM | POA: Diagnosis not present

## 2012-02-23 DIAGNOSIS — M949 Disorder of cartilage, unspecified: Secondary | ICD-10-CM | POA: Diagnosis not present

## 2012-02-23 DIAGNOSIS — J301 Allergic rhinitis due to pollen: Secondary | ICD-10-CM | POA: Diagnosis not present

## 2012-02-23 DIAGNOSIS — H409 Unspecified glaucoma: Secondary | ICD-10-CM | POA: Diagnosis not present

## 2012-02-23 DIAGNOSIS — Z0000 Encounter for general adult medical examination without abnormal findings: Secondary | ICD-10-CM | POA: Diagnosis not present

## 2012-02-23 DIAGNOSIS — Z853 Personal history of malignant neoplasm of breast: Secondary | ICD-10-CM | POA: Diagnosis not present

## 2012-02-23 DIAGNOSIS — E7800 Pure hypercholesterolemia, unspecified: Secondary | ICD-10-CM | POA: Diagnosis not present

## 2012-02-23 DIAGNOSIS — K219 Gastro-esophageal reflux disease without esophagitis: Secondary | ICD-10-CM | POA: Diagnosis not present

## 2012-02-23 DIAGNOSIS — I10 Essential (primary) hypertension: Secondary | ICD-10-CM | POA: Diagnosis not present

## 2012-02-23 DIAGNOSIS — M899 Disorder of bone, unspecified: Secondary | ICD-10-CM | POA: Diagnosis not present

## 2012-02-25 DIAGNOSIS — M949 Disorder of cartilage, unspecified: Secondary | ICD-10-CM | POA: Diagnosis not present

## 2012-02-25 DIAGNOSIS — M899 Disorder of bone, unspecified: Secondary | ICD-10-CM | POA: Diagnosis not present

## 2012-03-11 ENCOUNTER — Other Ambulatory Visit: Payer: Self-pay | Admitting: Family Medicine

## 2012-03-11 DIAGNOSIS — Z9889 Other specified postprocedural states: Secondary | ICD-10-CM

## 2012-03-11 DIAGNOSIS — Z853 Personal history of malignant neoplasm of breast: Secondary | ICD-10-CM

## 2012-03-30 DIAGNOSIS — H353290 Exudative age-related macular degeneration, unspecified eye, stage unspecified: Secondary | ICD-10-CM | POA: Diagnosis not present

## 2012-03-30 DIAGNOSIS — H35059 Retinal neovascularization, unspecified, unspecified eye: Secondary | ICD-10-CM | POA: Diagnosis not present

## 2012-04-12 ENCOUNTER — Ambulatory Visit
Admission: RE | Admit: 2012-04-12 | Discharge: 2012-04-12 | Disposition: A | Discharge status: Home / Self Care | Payer: Medicare Other | Source: Ambulatory Visit | Attending: Family Medicine | Admitting: Family Medicine

## 2012-04-12 DIAGNOSIS — Z9889 Other specified postprocedural states: Secondary | ICD-10-CM

## 2012-04-12 DIAGNOSIS — Z853 Personal history of malignant neoplasm of breast: Secondary | ICD-10-CM | POA: Diagnosis not present

## 2012-04-20 DIAGNOSIS — H43829 Vitreomacular adhesion, unspecified eye: Secondary | ICD-10-CM | POA: Diagnosis not present

## 2012-05-11 DIAGNOSIS — H35059 Retinal neovascularization, unspecified, unspecified eye: Secondary | ICD-10-CM | POA: Diagnosis not present

## 2012-05-11 DIAGNOSIS — H353290 Exudative age-related macular degeneration, unspecified eye, stage unspecified: Secondary | ICD-10-CM | POA: Diagnosis not present

## 2012-05-11 DIAGNOSIS — H3570 Unspecified separation of retinal layers: Secondary | ICD-10-CM | POA: Diagnosis not present

## 2012-06-22 DIAGNOSIS — H353290 Exudative age-related macular degeneration, unspecified eye, stage unspecified: Secondary | ICD-10-CM | POA: Diagnosis not present

## 2012-06-22 DIAGNOSIS — H3570 Unspecified separation of retinal layers: Secondary | ICD-10-CM | POA: Diagnosis not present

## 2012-06-22 DIAGNOSIS — H35059 Retinal neovascularization, unspecified, unspecified eye: Secondary | ICD-10-CM | POA: Diagnosis not present

## 2012-06-22 DIAGNOSIS — H35359 Cystoid macular degeneration, unspecified eye: Secondary | ICD-10-CM | POA: Diagnosis not present

## 2012-08-16 DIAGNOSIS — H35059 Retinal neovascularization, unspecified, unspecified eye: Secondary | ICD-10-CM | POA: Diagnosis not present

## 2012-08-16 DIAGNOSIS — H353290 Exudative age-related macular degeneration, unspecified eye, stage unspecified: Secondary | ICD-10-CM | POA: Diagnosis not present

## 2012-08-16 DIAGNOSIS — H35359 Cystoid macular degeneration, unspecified eye: Secondary | ICD-10-CM | POA: Diagnosis not present

## 2012-08-24 DIAGNOSIS — I10 Essential (primary) hypertension: Secondary | ICD-10-CM | POA: Diagnosis not present

## 2012-08-24 DIAGNOSIS — R079 Chest pain, unspecified: Secondary | ICD-10-CM | POA: Diagnosis not present

## 2012-08-24 DIAGNOSIS — E7800 Pure hypercholesterolemia, unspecified: Secondary | ICD-10-CM | POA: Diagnosis not present

## 2012-09-06 ENCOUNTER — Ambulatory Visit: Payer: Medicare Other | Admitting: Cardiology

## 2012-09-08 ENCOUNTER — Ambulatory Visit (INDEPENDENT_AMBULATORY_CARE_PROVIDER_SITE_OTHER): Payer: Medicare Other | Admitting: Cardiovascular Disease

## 2012-09-08 ENCOUNTER — Encounter: Payer: Self-pay | Admitting: Cardiovascular Disease

## 2012-09-08 ENCOUNTER — Encounter (HOSPITAL_COMMUNITY): Payer: Self-pay | Admitting: Cardiovascular Disease

## 2012-09-08 VITALS — BP 142/80 | HR 66 | Resp 16 | Ht 63.0 in | Wt 191.9 lb

## 2012-09-08 DIAGNOSIS — Z853 Personal history of malignant neoplasm of breast: Secondary | ICD-10-CM

## 2012-09-08 DIAGNOSIS — E785 Hyperlipidemia, unspecified: Secondary | ICD-10-CM

## 2012-09-08 DIAGNOSIS — R079 Chest pain, unspecified: Secondary | ICD-10-CM

## 2012-09-08 DIAGNOSIS — K219 Gastro-esophageal reflux disease without esophagitis: Secondary | ICD-10-CM

## 2012-09-08 DIAGNOSIS — I209 Angina pectoris, unspecified: Secondary | ICD-10-CM | POA: Diagnosis not present

## 2012-09-08 DIAGNOSIS — I10 Essential (primary) hypertension: Secondary | ICD-10-CM

## 2012-09-08 NOTE — Patient Instructions (Addendum)
Marland KitchenLexiscan Stress Electrocardiography Your caregiver has ordered a Lexiscan Stress Test with nuclear imaging. The purpose of this test is to evaluate the blood supply to your heart muscle. This procedure is referred to as a "Non-Invasive Stress Test." This is because other than having an IV started in your vein, nothing is inserted or "invades" your body. Cardiac stress tests are done to find areas of poor blood flow to the heart by determining the extent of coronary artery disease (CAD). Some patients exercise on a treadmill, which naturally increases the blood flow to your heart. However, almost half of the patients undergoing a treadmill stress test each year are unable to exercise adequately. This is due to various medical conditions. For these patients, a pharmacologic/chemical stress agent called Eugenie Birks is used on patients without a history of asthma. This medicine will mimic walking on a treadmill by temporarily increasing your coronary blood flow. The side effects of this medicine include:  Headache.  Dizziness.  Shortness of breath.  Flushing.  Chest pain/pressure.  Feeling sick to your stomach (nauseous).  Increased heart rate.  Abdominal discomfort.  Low blood pressure. BEFORE THE PROCEDURE   Avoid all forms of caffeine 24 hours before your test. This includes coffee, tea (even decaffeinated brands), caffeinated sodas, chocolate, cocoa and certain pain medications that contain caffeine.  Do not eat anything 6 hours before the test. In hospitalized patients, this means nothing by mouth after midnight.  Patients with diabetes that are not hospitalized (outpatients) should talk to their caregiver about how much, if any, insulin or oral diabetic medications they should take the day of the test. You should also talk about the type of light snack that you should eat the morning of the test.  Patients with diabetes that are hospitalized (inpatients) will follow the cardiologist's  written instructions that will be given to you.  Outpatients should bring a snack. Use the hospital vending machines so that you may eat right after the stress phase.  Wear comfortable shoes. There is at least an hour wait before the stress phase of nuclear scanning is done. The total procedure time is approximately 3 hours.  The following medications should be stopped 24 hours before your stress test: Beta-blockers and nitroglycerine (patches or paste should be removed 4 hours before the test).  Women, tell your caregiver if there is any possibility that you may be pregnant or if you are currently breastfeeding. PROCEDURE  There are two separate nuclear images taken using a nuclear camera. These images require a small dose of a radiotracer called an isotope. Most diagnostic nuclear medicine procedures result in low radiation exposure. Allergic reactions to the isotope may include slight redness going up the arm and pain during the injection. However, these reactions are extremely rare and usually mild. Eugenie Birks is given very rapidly over 10-15 seconds in the vein (intravenously) followed by a nuclear isotope. The medication causes the arteries in your heart to widen (dilate), and the nuclear isotope lights up those arteries so that the nuclear images are clear. Together, this shows whether the coronary blood flow is normal or abnormal. During this stress phase, you will be connected to an EKG machine. Your blood pressure and oxygen levels will be monitored by the cardiologist and cardiac nurse. This part usually lasts 5-10 minutes. For inpatients, the procedure is done in the patient's room. For outpatients, the procedure is done in the stress lab.  The "Resting Phase" is done before the Lexiscan injection and shows how your heart  functions at rest. The "Stress Phase" is done about 1 hour after the Lexiscan injection and determines how your heart functions under stress.  LEXISCAN STRESS TEST IS USEFUL  FOR DETERMINING:  The adequacy of blood flow to your heart during increased levels of activity in order to clear you for discharge home.  The extent of coronary artery blockage caused by CAD.  Your prognosis if you have suffered a heart attack.  The effectiveness of cardiac procedures done, such as an angioplasty, which can increase the circulation in your coronary arteries.  Cause(s) of chest pain/pressure. Finding out the results of your test  Ask when your test results will be ready. Make sure you get your test results. Document Released: 08/03/2008 Document Revised: 06/09/2011 Document Reviewed: 08/03/2008 [facility] Patient Information 2014 [place], [place].   Your physician recommends that you schedule a follow-up appointment in: AS NEEDED

## 2012-09-09 DIAGNOSIS — K219 Gastro-esophageal reflux disease without esophagitis: Secondary | ICD-10-CM | POA: Insufficient documentation

## 2012-09-09 DIAGNOSIS — R079 Chest pain, unspecified: Secondary | ICD-10-CM | POA: Insufficient documentation

## 2012-09-09 DIAGNOSIS — I10 Essential (primary) hypertension: Secondary | ICD-10-CM | POA: Insufficient documentation

## 2012-09-09 DIAGNOSIS — E7800 Pure hypercholesterolemia, unspecified: Secondary | ICD-10-CM | POA: Insufficient documentation

## 2012-09-09 NOTE — Assessment & Plan Note (Signed)
Her symptoms could then fully represent angina pectoris and at least one episode happened with greater than usual physical activity. On the other hand she has previously attributed similar symptoms to gastroesophageal reflux disease. She has intermediate coronary risk factor profile. I have recommended that she undergo a stress test. Unfortunately she states that her "bad knees" will not let her exercise treadmill. She'll be scheduled for a [employer]. If the study is normal I would focus my attention on possible gas esophageal source of her symptoms. If the study shows ischemia she should undergo coronary angiography, especially since she has had similar symptoms at night.

## 2012-09-09 NOTE — Assessment & Plan Note (Signed)
No history of chest radiation therapy.

## 2012-09-09 NOTE — Assessment & Plan Note (Addendum)
She is on a reasonable dose of a highly active statin. Very high HDL level should be protected. LDL and total cholesterol are also within the desirable range.

## 2012-09-09 NOTE — Progress Notes (Signed)
Patient ID: ARRIONNA SERENA, female   DOB: 04-08-1934, 78 y.o.   MRN: [id number]     Reason for office visit Chest pain  Mrs. Dastrup's husband is also in our patients. She does not have known cardiac disease in the past, but presents today for evaluation of chest pain. Her first episode occurred while she was walking up hill at a faster than usual pace, into place which she considers to be high-altitude ([place], [place]). The discomfort was described as mid retrosternal heaviness associated with discomfort in her lower jaw. She states that she had attributed similar complaints in the past to esophageal reflux. The symptoms subsided when she rested for about 5 minutes and did not recur when she started walking again at a slower pace. However a similar event occurred later in the middle of the night and woke her from sleep, again lasting about 5 minutes. Since that time no new occurrences.  She is self described as being very sedentary. She blames this on "bad knees". She has systemic hypertension and hyperlipidemia both of which are reportedly well controlled with pharmacological means. She does not have diabetes mellitus and has never smoked. There does not appear to be a strong family history of coronary artery disease except advanced ages.  Her electrocardiogram is normal.    Allergies  Allergen Reactions  . Nsaids Other (See Comments)    Extreme hives and difficulty breathing. "Has had to go to the ER."  . Salicylates Other (See Comments)    Extreme hives and difficulty breathing. "Has had to go to the ER."    Current Outpatient Prescriptions  Medication Sig Dispense Refill  . Acetaminophen (TYLENOL EXTRA STRENGTH PO) Take 2 tablets by mouth daily.      Marland Kitchen atorvastatin (LIPITOR) 20 MG tablet Take 20 mg by mouth daily.        . Brimonidine Tartrate (ALPHAGAN P OP) Apply to eye 2 (two) times daily.        . fexofenadine (ALLEGRA) 180 MG tablet Take 180 mg by mouth daily.         Marland Kitchen HYDROcodone-acetaminophen (NORCO) 5-325 MG per tablet Take 1 tablet by mouth daily.       Marland Kitchen latanoprost (XALATAN) 0.005 % ophthalmic solution Place 1 drop into both eyes daily.       . Multiple Vitamins-Minerals (ICAPS) TABS Take 1 tablet by mouth daily.      Marland Kitchen olmesartan-hydrochlorothiazide (BENICAR HCT) 20-12.5 MG per tablet Take 1 tablet by mouth daily.      Marland Kitchen omeprazole (PRILOSEC) 20 MG capsule Take 20 mg by mouth daily.         No current facility-administered medications for this visit.    Past Medical History  Diagnosis Date  . Hyperlipidemia   . Allergy   . Hypertension   . Cancer     bilateral breast  . Osteopenia   . GERD (gastroesophageal reflux disease)   . Macular degeneration     right eye    Past Surgical History  Procedure Laterality Date  . Breast lumpectomy  04/19/04    left with sentinel node  . Abdominal hysterectomy  1984  . Breast biopsy  04/29/05    right   . Cataract extraction      bilateral  . Retinal laser procedure      right    Family History  Problem Relation Age of Onset  . Dementia Mother   . Stroke Mother   . Heart disease Mother 59  .  Cancer Father     Prostate  . Heart disease Father 77  . Cancer Maternal Aunt     Breast  . Cancer Maternal Grandmother   . Heart attack Maternal Grandfather   . Heart attack Paternal Grandfather     History   Social History  . Marital Status: Married    Spouse Name: N/A    Number of Children: N/A  . Years of Education: N/A   Occupational History  . Not on file.   Social History Main Topics  . Smoking status: Former Games developer  . Smokeless tobacco: Never Used  . Alcohol Use: Yes     Comment: 2-3 drinks per night  . Drug Use: No  . Sexually Active: Not on file   Other Topics Concern  . Not on file   Social History Narrative  . No narrative on file    Review of systems: Except for the events described in the history of present illness the patient specifically denies any chest  pain at rest or with exertion, dyspnea at rest or with exertion, orthopnea, paroxysmal nocturnal dyspnea, syncope, palpitations, focal neurological deficits, intermittent claudication, lower extremity edema, unexplained weight gain, cough, hemoptysis or wheezing.  The patient also denies abdominal pain, nausea, vomiting, dysphagia, diarrhea, constipation, polyuria, polydipsia, dysuria, hematuria, frequency, urgency, abnormal bleeding or bruising, fever, chills, unexpected weight changes, mood swings, change in skin or hair texture, change in voice quality, auditory or visual problems, allergic reactions or rashes, new musculoskeletal complaints other than usual "aches and pains".   PHYSICAL EXAM BP 142/80  Pulse 66  Resp 16  Ht 5\' 3"  (1.6 m)  Wt 191 lb 14.4 oz (87.045 kg)  BMI 34 kg/m2  General: Alert, oriented x3, no distress Head: no evidence of trauma, PERRL, EOMI, no exophtalmos or lid lag, no myxedema, no xanthelasma; normal ears, nose and oropharynx Neck: normal jugular venous pulsations and no hepatojugular reflux; brisk carotid pulses without delay and no carotid bruits Chest: clear to auscultation, no signs of consolidation by percussion or palpation, normal fremitus, symmetrical and full respiratory excursions Cardiovascular: normal position and quality of the apical impulse, regular rhythm, normal first and second heart sounds, no murmurs, rubs or gallops Abdomen: no tenderness or distention, no masses by palpation, no abnormal pulsatility or arterial bruits, normal bowel sounds, no hepatosplenomegaly Extremities: no clubbing, cyanosis or edema; 2+ radial, ulnar and brachial pulses bilaterally; 2+ right femoral, posterior tibial and dorsalis pedis pulses; 2+ left femoral, posterior tibial and dorsalis pedis pulses; no subclavian or femoral bruits Neurological: grossly nonfocal   EKG: Normal sinus rhythm normal tracing  Lipid Panel  Per Dr. Jone Baseman notes total cholesterol 170,  triglycerides 91, HDL 76, LDL 76.  BMET    Component Value Date/Time   NA 138 08/06/2010 1554   K 3.1* 08/06/2010 1554   CL 98 08/06/2010 1554   CO2 32 08/06/2010 1554   GLUCOSE 111* 08/06/2010 1554   BUN 13 08/06/2010 1554   CREATININE 0.84 08/06/2010 1554   CALCIUM 10.1 08/06/2010 1554   GFRNONAA >60 10/23/2009 0002   GFRAA  Value: >60        The eGFR has been calculated using the MDRD equation. This calculation has not been validated in all clinical situations. eGFR's persistently <60 mL/min signify possible Chronic Kidney Disease. 10/23/2009 0002     ASSESSMENT AND PLAN Chest pain Her symptoms could then fully represent angina pectoris and at least one episode happened with greater than usual physical activity. On the  other hand she has previously attributed similar symptoms to gastroesophageal reflux disease. She has intermediate coronary risk factor profile. I have recommended that she undergo a stress test. Unfortunately she states that her "bad knees" will not let her exercise treadmill. She'll be scheduled for a [employer]. If the study is normal I would focus my attention on possible gas esophageal source of her symptoms. If the study shows ischemia she should undergo coronary angiography, especially since she has had similar symptoms at night.  Hyperlipidemia She is on a reasonable dose of a highly active statin. We will try to obtain her most recent lipid profile for review.  GERD (gastroesophageal reflux disease) This has historically been very well controlled on her current proton pump inhibitor regimen. Nevertheless, if her stress test is negative this is where I would focus further diagnostic evaluation.  hx: breast cancer, left, invasive ductal carcinoma, right, DCIS No history of chest radiation therapy.  Orders Placed This Encounter  Procedures  . Myocardial Perfusion Imaging  . EKG 12-Lead   Meds ordered this encounter  Medications  . olmesartan-hydrochlorothiazide  (BENICAR HCT) 20-12.5 MG per tablet    Sig: Take 1 tablet by mouth daily.  . Acetaminophen (TYLENOL EXTRA STRENGTH PO)    Sig: Take 2 tablets by mouth daily.  . Multiple Vitamins-Minerals (ICAPS) TABS    Sig: Take 1 tablet by mouth daily.    Junious Silk, MD, [facility] [phone] office [phone] pager

## 2012-09-09 NOTE — Assessment & Plan Note (Signed)
This has historically been very well controlled on her current proton pump inhibitor regimen. Nevertheless, if her stress test is negative this is where I would focus further diagnostic evaluation.

## 2012-09-13 ENCOUNTER — Encounter: Payer: Self-pay | Admitting: Cardiovascular Disease

## 2012-09-17 ENCOUNTER — Ambulatory Visit (HOSPITAL_COMMUNITY)
Admission: RE | Admit: 2012-09-17 | Discharge: 2012-09-17 | Disposition: A | Discharge status: Home / Self Care | Payer: Medicare Other | Source: Ambulatory Visit | Attending: Internal Medicine | Admitting: Internal Medicine

## 2012-09-17 DIAGNOSIS — R079 Chest pain, unspecified: Secondary | ICD-10-CM | POA: Insufficient documentation

## 2012-09-17 DIAGNOSIS — E669 Obesity, unspecified: Secondary | ICD-10-CM | POA: Insufficient documentation

## 2012-09-17 DIAGNOSIS — I209 Angina pectoris, unspecified: Secondary | ICD-10-CM

## 2012-09-17 DIAGNOSIS — R5383 Other fatigue: Secondary | ICD-10-CM | POA: Insufficient documentation

## 2012-09-17 DIAGNOSIS — R0602 Shortness of breath: Secondary | ICD-10-CM | POA: Insufficient documentation

## 2012-09-17 DIAGNOSIS — I10 Essential (primary) hypertension: Secondary | ICD-10-CM | POA: Insufficient documentation

## 2012-09-17 DIAGNOSIS — C50919 Malignant neoplasm of unspecified site of unspecified female breast: Secondary | ICD-10-CM | POA: Insufficient documentation

## 2012-09-17 DIAGNOSIS — R002 Palpitations: Secondary | ICD-10-CM | POA: Diagnosis not present

## 2012-09-17 DIAGNOSIS — R5381 Other malaise: Secondary | ICD-10-CM | POA: Insufficient documentation

## 2012-09-17 DIAGNOSIS — Z87891 Personal history of nicotine dependence: Secondary | ICD-10-CM | POA: Insufficient documentation

## 2012-09-17 DIAGNOSIS — R42 Dizziness and giddiness: Secondary | ICD-10-CM | POA: Insufficient documentation

## 2012-09-17 MED ORDER — REGADENOSON 0.4 MG/5ML IV SOLN
0.4000 mg | Freq: Once | INTRAVENOUS | Status: AC
  Administered 2012-09-17: 0.4 mg via INTRAVENOUS

## 2012-09-17 MED ORDER — TECHNETIUM TC 99M SESTAMIBI GENERIC - CARDIOLITE
30.0000 | Freq: Once | INTRAVENOUS | Status: AC | PRN
  Administered 2012-09-17: 30 via INTRAVENOUS

## 2012-09-17 MED ORDER — TECHNETIUM TC 99M SESTAMIBI GENERIC - CARDIOLITE
10.0000 | Freq: Once | INTRAVENOUS | Status: AC | PRN
  Administered 2012-09-17: 10 via INTRAVENOUS

## 2012-09-17 NOTE — Procedures (Addendum)
[facility] [address] [place] [address] [phone]  Cardiology Nuclear Med Study  Melinda [name] is a 78 y.o. female     MRN : [id number]     DOB: [birth year]  Procedure Date: 09/17/2012  Nuclear Med Background Indication for Stress Test:  Evaluation for Ischemia  History:  BIL BREAST CA Cardiac Risk Factors: History of Smoking, Hypertension, Lipids and Obesity  Symptoms:  Chest Pain, Dizziness, Fatigue, Light-Headedness, Palpitations and SOB   Nuclear Pre-Procedure Caffeine/Decaff Intake:  10:00pm NPO After: 8:00am   IV Site: R Forearm  IV 0.9% NS with Angio Cath:  22g  Chest Size (in):  N/A IV Started by: Emmit Pomfret, RN  Height: 5\' 3"  (1.6 m)  Cup Size: DD  BMI:  Body mass index is 33.84 kg/(m^2). Weight:  191 lb (86.637 kg)   Tech Comments:  N/A    Nuclear Med Study 1 or 2 day study: 1 day  Stress Test Type:  Lexiscan  Order Authorizing Provider:  Thurmon Fair, MD   Resting Radionuclide: Technetium 57m Sestamibi  Resting Radionuclide Dose: 10.8 mCi   Stress Radionuclide:  Technetium 53m Sestamibi  Stress Radionuclide Dose: 32.0 mCi           Stress Protocol Rest HR: 65 Stress HR: 96  Rest BP: 143/76 Stress BP: 159/69  Exercise Time (min): n/a METS: n/a          Dose of Adenosine (mg):  n/a Dose of Lexiscan: 0.4 mg  Dose of Atropine (mg): n/a Dose of Dobutamine: n/a mcg/kg/min (at max HR)  Stress Test Technologist: Ernestene Mention, CCT Nuclear Technologist: Koren Shiver, CNMT   Rest Procedure:  Myocardial perfusion imaging was performed at rest 45 minutes following the intravenous administration of Technetium 60m Sestamibi. Stress Procedure:  The patient received IV Lexiscan 0.4 mg over 15-seconds.  Technetium 24m Sestamibi injected at 30-seconds.  There were no significant changes with Lexiscan.  Quantitative spect images were obtained after a 45 minute delay.  Transient Ischemic  Dilatation (Normal <1.22):  1.05 Lung/Heart Ratio (Normal <0.45):  0.27 QGS EDV:  77 ml QGS ESV:  30 ml LV Ejection Fraction: 61%  Rest ECG: NSR - Normal EKG  Stress ECG: No significant change from baseline ECG  QPS Raw Data Images:  Normal; no motion artifact; normal heart/lung ratio. Stress Images:  Normal homogeneous uptake in all areas of the myocardium. Rest Images:  Normal homogeneous uptake in all areas of the myocardium. Subtraction (SDS):  No evidence of ischemia.  Impression Exercise Capacity:  Lexiscan with no exercise. BP Response:  Normal blood pressure response. Clinical Symptoms:  There is dyspnea. ECG Impression:  No significant ECG changes with Lexiscan. Comparison with Prior Nuclear Study: No previous nuclear study performed  Overall Impression:  Normal stress nuclear study.  LV Wall Motion:  NL LV Function; NL Wall Motion; EF 61%  Chrystie Nose, MD, [facility] Board Certified in Nuclear Cardiology Attending Cardiologist [facility]  Chrystie Nose, MD  09/17/2012 6:51 PM

## 2012-10-11 DIAGNOSIS — H409 Unspecified glaucoma: Secondary | ICD-10-CM | POA: Diagnosis not present

## 2012-10-11 DIAGNOSIS — H40059 Ocular hypertension, unspecified eye: Secondary | ICD-10-CM | POA: Diagnosis not present

## 2012-10-11 DIAGNOSIS — H4011X0 Primary open-angle glaucoma, stage unspecified: Secondary | ICD-10-CM | POA: Diagnosis not present

## 2012-10-13 DIAGNOSIS — M1710 Unilateral primary osteoarthritis, unspecified knee: Secondary | ICD-10-CM | POA: Diagnosis not present

## 2012-10-15 DIAGNOSIS — H4011X0 Primary open-angle glaucoma, stage unspecified: Secondary | ICD-10-CM | POA: Diagnosis not present

## 2012-10-15 DIAGNOSIS — H409 Unspecified glaucoma: Secondary | ICD-10-CM | POA: Diagnosis not present

## 2012-11-22 DIAGNOSIS — H35379 Puckering of macula, unspecified eye: Secondary | ICD-10-CM | POA: Diagnosis not present

## 2012-11-22 DIAGNOSIS — H35359 Cystoid macular degeneration, unspecified eye: Secondary | ICD-10-CM | POA: Diagnosis not present

## 2012-11-22 DIAGNOSIS — H35369 Drusen (degenerative) of macula, unspecified eye: Secondary | ICD-10-CM | POA: Diagnosis not present

## 2013-01-17 DIAGNOSIS — H409 Unspecified glaucoma: Secondary | ICD-10-CM | POA: Diagnosis not present

## 2013-01-17 DIAGNOSIS — H4011X0 Primary open-angle glaucoma, stage unspecified: Secondary | ICD-10-CM | POA: Diagnosis not present

## 2013-01-17 DIAGNOSIS — H40059 Ocular hypertension, unspecified eye: Secondary | ICD-10-CM | POA: Diagnosis not present

## 2013-01-31 DIAGNOSIS — H409 Unspecified glaucoma: Secondary | ICD-10-CM | POA: Diagnosis not present

## 2013-01-31 DIAGNOSIS — H4011X0 Primary open-angle glaucoma, stage unspecified: Secondary | ICD-10-CM | POA: Diagnosis not present

## 2013-03-03 DIAGNOSIS — H4011X0 Primary open-angle glaucoma, stage unspecified: Secondary | ICD-10-CM | POA: Diagnosis not present

## 2013-03-03 DIAGNOSIS — H409 Unspecified glaucoma: Secondary | ICD-10-CM | POA: Diagnosis not present

## 2013-03-08 DIAGNOSIS — M899 Disorder of bone, unspecified: Secondary | ICD-10-CM | POA: Diagnosis not present

## 2013-03-08 DIAGNOSIS — Z23 Encounter for immunization: Secondary | ICD-10-CM | POA: Diagnosis not present

## 2013-03-08 DIAGNOSIS — E7800 Pure hypercholesterolemia, unspecified: Secondary | ICD-10-CM | POA: Diagnosis not present

## 2013-03-08 DIAGNOSIS — Z0000 Encounter for general adult medical examination without abnormal findings: Secondary | ICD-10-CM | POA: Diagnosis not present

## 2013-03-08 DIAGNOSIS — K219 Gastro-esophageal reflux disease without esophagitis: Secondary | ICD-10-CM | POA: Diagnosis not present

## 2013-03-08 DIAGNOSIS — I10 Essential (primary) hypertension: Secondary | ICD-10-CM | POA: Diagnosis not present

## 2013-03-08 DIAGNOSIS — H409 Unspecified glaucoma: Secondary | ICD-10-CM | POA: Diagnosis not present

## 2013-03-08 DIAGNOSIS — J301 Allergic rhinitis due to pollen: Secondary | ICD-10-CM | POA: Diagnosis not present

## 2013-03-08 DIAGNOSIS — Z853 Personal history of malignant neoplasm of breast: Secondary | ICD-10-CM | POA: Diagnosis not present

## 2013-03-10 DIAGNOSIS — M25569 Pain in unspecified knee: Secondary | ICD-10-CM | POA: Diagnosis not present

## 2013-03-16 ENCOUNTER — Other Ambulatory Visit: Payer: Self-pay

## 2013-03-16 DIAGNOSIS — Z9889 Other specified postprocedural states: Secondary | ICD-10-CM

## 2013-03-16 DIAGNOSIS — Z1231 Encounter for screening mammogram for malignant neoplasm of breast: Secondary | ICD-10-CM

## 2013-03-16 DIAGNOSIS — Z853 Personal history of malignant neoplasm of breast: Secondary | ICD-10-CM

## 2013-04-20 ENCOUNTER — Ambulatory Visit
Admission: RE | Admit: 2013-04-20 | Discharge: 2013-04-20 | Disposition: A | Discharge status: Home / Self Care | Payer: Medicare Other | Source: Ambulatory Visit

## 2013-04-20 DIAGNOSIS — Z1231 Encounter for screening mammogram for malignant neoplasm of breast: Secondary | ICD-10-CM | POA: Diagnosis not present

## 2013-04-20 DIAGNOSIS — Z9889 Other specified postprocedural states: Secondary | ICD-10-CM

## 2013-04-20 DIAGNOSIS — Z853 Personal history of malignant neoplasm of breast: Secondary | ICD-10-CM

## 2013-05-23 DIAGNOSIS — H43829 Vitreomacular adhesion, unspecified eye: Secondary | ICD-10-CM | POA: Diagnosis not present

## 2013-05-23 DIAGNOSIS — H353190 Nonexudative age-related macular degeneration, unspecified eye, stage unspecified: Secondary | ICD-10-CM | POA: Diagnosis not present

## 2013-05-23 DIAGNOSIS — H43819 Vitreous degeneration, unspecified eye: Secondary | ICD-10-CM | POA: Diagnosis not present

## 2013-07-25 ENCOUNTER — Inpatient Hospital Stay (HOSPITAL_COMMUNITY)
Admission: EM | Admit: 2013-07-25 | Discharge: 2013-07-27 | DRG: 281 | Disposition: A | Discharge status: Home / Self Care | Payer: Medicare Other | Attending: Cardiology | Admitting: Cardiology

## 2013-07-25 ENCOUNTER — Encounter (HOSPITAL_COMMUNITY): Payer: Self-pay | Admitting: Emergency Medicine

## 2013-07-25 ENCOUNTER — Emergency Department (HOSPITAL_COMMUNITY): Payer: Medicare Other

## 2013-07-25 DIAGNOSIS — E876 Hypokalemia: Secondary | ICD-10-CM | POA: Diagnosis present

## 2013-07-25 DIAGNOSIS — H3530 Unspecified macular degeneration: Secondary | ICD-10-CM | POA: Diagnosis present

## 2013-07-25 DIAGNOSIS — E669 Obesity, unspecified: Secondary | ICD-10-CM | POA: Diagnosis present

## 2013-07-25 DIAGNOSIS — I5181 Takotsubo syndrome: Secondary | ICD-10-CM | POA: Diagnosis not present

## 2013-07-25 DIAGNOSIS — I10 Essential (primary) hypertension: Secondary | ICD-10-CM | POA: Diagnosis present

## 2013-07-25 DIAGNOSIS — I2584 Coronary atherosclerosis due to calcified coronary lesion: Secondary | ICD-10-CM | POA: Diagnosis present

## 2013-07-25 DIAGNOSIS — K219 Gastro-esophageal reflux disease without esophagitis: Secondary | ICD-10-CM | POA: Diagnosis present

## 2013-07-25 DIAGNOSIS — I214 Non-ST elevation (NSTEMI) myocardial infarction: Secondary | ICD-10-CM

## 2013-07-25 DIAGNOSIS — Z8249 Family history of ischemic heart disease and other diseases of the circulatory system: Secondary | ICD-10-CM

## 2013-07-25 DIAGNOSIS — I2510 Atherosclerotic heart disease of native coronary artery without angina pectoris: Secondary | ICD-10-CM | POA: Diagnosis present

## 2013-07-25 DIAGNOSIS — R079 Chest pain, unspecified: Secondary | ICD-10-CM | POA: Diagnosis not present

## 2013-07-25 DIAGNOSIS — Z87891 Personal history of nicotine dependence: Secondary | ICD-10-CM

## 2013-07-25 DIAGNOSIS — Z9849 Cataract extraction status, unspecified eye: Secondary | ICD-10-CM | POA: Diagnosis not present

## 2013-07-25 DIAGNOSIS — Z823 Family history of stroke: Secondary | ICD-10-CM | POA: Diagnosis not present

## 2013-07-25 DIAGNOSIS — R000 Tachycardia, unspecified: Secondary | ICD-10-CM | POA: Diagnosis present

## 2013-07-25 DIAGNOSIS — Z853 Personal history of malignant neoplasm of breast: Secondary | ICD-10-CM | POA: Diagnosis not present

## 2013-07-25 DIAGNOSIS — R0602 Shortness of breath: Secondary | ICD-10-CM | POA: Diagnosis not present

## 2013-07-25 DIAGNOSIS — E785 Hyperlipidemia, unspecified: Secondary | ICD-10-CM | POA: Diagnosis not present

## 2013-07-25 DIAGNOSIS — E871 Hypo-osmolality and hyponatremia: Secondary | ICD-10-CM | POA: Diagnosis not present

## 2013-07-25 DIAGNOSIS — J811 Chronic pulmonary edema: Secondary | ICD-10-CM | POA: Diagnosis not present

## 2013-07-25 DIAGNOSIS — E7800 Pure hypercholesterolemia, unspecified: Secondary | ICD-10-CM | POA: Diagnosis present

## 2013-07-25 HISTORY — DX: Anemia, unspecified: D64.9

## 2013-07-25 HISTORY — DX: Takotsubo syndrome: I51.81

## 2013-07-25 HISTORY — DX: Acute myocardial infarction, unspecified: I21.9

## 2013-07-25 HISTORY — DX: Personal history of other medical treatment: Z92.89

## 2013-07-25 LAB — CBC WITH DIFFERENTIAL/PLATELET
BASOS PCT: 0 % (ref 0–1)
Basophils Absolute: 0 10*3/uL (ref 0.0–0.1)
EOS PCT: 1 % (ref 0–5)
Eosinophils Absolute: 0.1 10*3/uL (ref 0.0–0.7)
HEMATOCRIT: 42.5 % (ref 36.0–46.0)
HEMOGLOBIN: 14.4 g/dL (ref 12.0–15.0)
Lymphocytes Relative: 16 % (ref 12–46)
Lymphs Abs: 1.8 10*3/uL (ref 0.7–4.0)
MCH: 33.3 pg (ref 26.0–34.0)
MCHC: 33.9 g/dL (ref 30.0–36.0)
MCV: 98.4 fL (ref 78.0–100.0)
MONO ABS: 0.6 10*3/uL (ref 0.1–1.0)
Monocytes Relative: 5 % (ref 3–12)
Neutro Abs: 9.2 10*3/uL — ABNORMAL HIGH (ref 1.7–7.7)
Neutrophils Relative %: 78 % — ABNORMAL HIGH (ref 43–77)
Platelets: 321 10*3/uL (ref 150–400)
RBC: 4.32 MIL/uL (ref 3.87–5.11)
RDW: 12.6 % (ref 11.5–15.5)
WBC: 11.7 10*3/uL — ABNORMAL HIGH (ref 4.0–10.5)

## 2013-07-25 LAB — COMPREHENSIVE METABOLIC PANEL
ALBUMIN: 3.7 g/dL (ref 3.5–5.2)
ALT: 18 U/L (ref 0–35)
AST: 25 U/L (ref 0–37)
Alkaline Phosphatase: 89 U/L (ref 39–117)
BUN: 10 mg/dL (ref 6–23)
CALCIUM: 9.4 mg/dL (ref 8.4–10.5)
CHLORIDE: 90 meq/L — AB (ref 96–112)
CO2: 22 mEq/L (ref 19–32)
CREATININE: 0.47 mg/dL — AB (ref 0.50–1.10)
GFR calc Af Amer: >90 mL/min (ref >90)
GFR calc non Af Amer: >90 mL/min (ref >90)
Glucose, Bld: 149 mg/dL — ABNORMAL HIGH (ref 70–99)
Potassium: 3.4 mEq/L — ABNORMAL LOW (ref 3.7–5.3)
Sodium: 130 mEq/L — ABNORMAL LOW (ref 137–147)
Total Bilirubin: 0.4 mg/dL (ref 0.3–1.2)
Total Protein: 7.6 g/dL (ref 6.0–8.3)

## 2013-07-25 LAB — HEPARIN LEVEL (UNFRACTIONATED): HEPARIN UNFRACTIONATED: 0.3 [IU]/mL (ref 0.30–0.70)

## 2013-07-25 LAB — TROPONIN I
TROPONIN I: 1.3 ng/mL — AB (ref <0.30)
Troponin I: 2.44 ng/mL (ref <0.30)

## 2013-07-25 MED ORDER — FUROSEMIDE 10 MG/ML IJ SOLN
40.0000 mg | Freq: Once | INTRAMUSCULAR | Status: AC
  Administered 2013-07-25: 40 mg via INTRAVENOUS

## 2013-07-25 MED ORDER — FUROSEMIDE 10 MG/ML IJ SOLN
INTRAMUSCULAR | Status: AC
  Administered 2013-07-25: 40 mg via INTRAVENOUS
  Filled 2013-07-25: qty 4

## 2013-07-25 MED ORDER — SODIUM CHLORIDE 0.9 % IV SOLN
250.0000 mL | INTRAVENOUS | Status: DC | PRN
Start: 2013-07-25 — End: 2013-07-26

## 2013-07-25 MED ORDER — SODIUM CHLORIDE 0.9 % IJ SOLN
3.0000 mL | INTRAMUSCULAR | Status: DC | PRN
  Administered 2013-07-25: 3 mL via INTRAVENOUS

## 2013-07-25 MED ORDER — HEPARIN (PORCINE) IN NACL 100-0.45 UNIT/ML-% IJ SOLN
1100.0000 [IU]/h | INTRAMUSCULAR | Status: DC
  Administered 2013-07-25: 1000 [IU]/h via INTRAVENOUS
  Filled 2013-07-25 (×2): qty 250

## 2013-07-25 MED ORDER — NITROGLYCERIN IN D5W 200-5 MCG/ML-% IV SOLN
2.0000 ug/min | Freq: Once | INTRAVENOUS | Status: AC
  Administered 2013-07-25: 5 ug/min via INTRAVENOUS
  Filled 2013-07-25: qty 250

## 2013-07-25 MED ORDER — SODIUM CHLORIDE 0.9 % IV SOLN: INTRAVENOUS | Status: DC

## 2013-07-25 MED ORDER — ATORVASTATIN CALCIUM 20 MG PO TABS
20.0000 mg | ORAL_TABLET | Freq: Every day | ORAL | Status: DC
  Administered 2013-07-25 – 2013-07-26 (×2): 20 mg via ORAL
  Filled 2013-07-25 (×3): qty 1

## 2013-07-25 MED ORDER — HEPARIN BOLUS VIA INFUSION
4000.0000 [IU] | Freq: Once | INTRAVENOUS | Status: AC
  Administered 2013-07-25: 4000 [IU] via INTRAVENOUS
  Filled 2013-07-25: qty 4000

## 2013-07-25 MED ORDER — NITROGLYCERIN IN D5W 200-5 MCG/ML-% IV SOLN
2.0000 ug/min | INTRAVENOUS | Status: DC
  Administered 2013-07-25: 21:00:00 10 ug/min via INTRAVENOUS
  Administered 2013-07-26: 5 ug/min via INTRAVENOUS

## 2013-07-25 MED ORDER — METOPROLOL TARTRATE 12.5 MG HALF TABLET
12.5000 mg | ORAL_TABLET | Freq: Two times a day (BID) | ORAL | Status: DC
  Administered 2013-07-25: 12.5 mg via ORAL
  Filled 2013-07-25 (×3): qty 1

## 2013-07-25 MED ORDER — NITROGLYCERIN 0.4 MG SL SUBL: 0.4000 mg | SUBLINGUAL_TABLET | Freq: Once | SUBLINGUAL | Status: DC

## 2013-07-25 MED ORDER — GUAIFENESIN-DM 100-10 MG/5ML PO SYRP
15.0000 mL | ORAL_SOLUTION | ORAL | Status: DC | PRN
  Administered 2013-07-25: 15 mL via ORAL
  Filled 2013-07-25: qty 15

## 2013-07-25 MED ORDER — POTASSIUM CHLORIDE CRYS ER 20 MEQ PO TBCR
40.0000 meq | EXTENDED_RELEASE_TABLET | Freq: Once | ORAL | Status: AC
  Administered 2013-07-25: 23:00:00 40 meq via ORAL
  Filled 2013-07-25: qty 2

## 2013-07-25 MED ORDER — SODIUM CHLORIDE 0.9 % IJ SOLN
3.0000 mL | Freq: Two times a day (BID) | INTRAMUSCULAR | Status: DC
  Administered 2013-07-25: 23:00:00 3 mL via INTRAVENOUS

## 2013-07-25 MED ORDER — ALPRAZOLAM 0.25 MG PO TABS
0.2500 mg | ORAL_TABLET | Freq: Three times a day (TID) | ORAL | Status: DC | PRN
  Administered 2013-07-25 – 2013-07-26 (×2): 0.25 mg via ORAL
  Filled 2013-07-25 (×3): qty 1

## 2013-07-25 NOTE — Significant Event (Addendum)
Rapid Response Event Note  Overview: Time Called: 2250 Arrival Time: 2255 Event Type: Respiratory  Initial Focused Assessment: Called by [facility], RN with concern for pt's increasing SOB and cough, and tachycardia 130's and tachypnea 30's over the past hour.                                                  Upon arrival to room pt lying in bed, alert, very anxious,  looking sickly, pale,                                                           diaphoretic and c/o increasing SOB,  Has non productive, irritating cough which                                                 patient states started in ED.   At 2300 VS 175/111    130 ST   RR 32   O2 sats 97% on                                              4l [place].  Bilateral BS diminished in right base and with EW  Left  upper                                                     Lobe.  Admitted for NSTEMI, denies CP at this time but does c/o vague back pain                                                              which she is unable to describe or rate.  Receiving heparin at 1000units                                                                               and NTG gtt at 10 mcg/min.   Interventions:                            Per Dr Orpah Greek given Lasix  40 mg IV, foley catheter inserted, increased HOB  and repositioned, Increased NTG gtt to 20 mcg,, Robitussin                                                                                        given for cough, offered  Xanax but pt declined med.  Hand off to Almyra Free, Therapist, sports.  Will                                                      continue to follow pt   At 2340 pt agreed to take Xanax.  165/84   128ST   32RR  95% on 6 l [place]  Foley output 200 cc  Event Summary: Name of Physician Notified: Dr Loralie Champagne at 2305       Outcome: Stayed in room and stabalized     Melinda [name] Melinda [name]  0030 Hrs F/U   Called by [facility] RN that pt had a near syncopal  episode when she had a bowel movement.  BP 77/48 HR 89 SR    RR 28 with 96% O2 Sats on 6l [place].  ON arrival to room pt flat in bed, alert, much calmer, states breathing much improved.  NTG off and pt was given 200 cc NS with BP 20B systolic. Still c/o vague pain under bilateral breast radiating to mid back, rating it 5/10, constant and dull.    Dr Deatra Robinson made aware of pt status.  Will resume NTG when BP allows.   Hand off to Almyra Free, Therapist, sports.  Will continue to follow

## 2013-07-25 NOTE — Progress Notes (Signed)
Pt decided to take xanax because she is "just so miserable."  Looks less distressed.

## 2013-07-25 NOTE — ED Notes (Signed)
Cardiology at bedside.

## 2013-07-25 NOTE — ED Notes (Signed)
Patient transported to X-ray 

## 2013-07-25 NOTE — Progress Notes (Signed)
ANTICOAGULATION CONSULT NOTE - Initial Consult  Pharmacy Consult for heparin Indication: chest pain/ACS  Allergies  Allergen Reactions  . Nsaids Other (See Comments)    Extreme hives and difficulty breathing. "Has had to go to the ER."  . Salicylates Other (See Comments)    Extreme hives and difficulty breathing. "Has had to go to the ER."    Patient Measurements: Height: 5\' 3"  (160 cm) Weight: 192 lb (87.091 kg) IBW/kg (Calculated) : 52.4 Heparin Dosing Weight: 71 kg  Vital Signs: Temp: 98.3 F (36.8 C) (04/27 1541) Temp src: Oral (04/27 1541) BP: 120/78 mmHg (04/27 1615) Pulse Rate: 115 (04/27 1615)  Labs:  Recent Labs  07/25/13 1555  HGB 14.4  HCT 42.5  PLT 321  CREATININE 0.47*  TROPONINI 1.30*    Estimated Creatinine Clearance: 62.6 ml/min (by C-G formula based on Cr of 0.47).   Medical History: Past Medical History  Diagnosis Date  . Hyperlipidemia   . Allergy   . Hypertension   . Cancer     bilateral breast  . Osteopenia   . GERD (gastroesophageal reflux disease)   . Macular degeneration     right eye    Medications:  See home med list  Assessment: 79 year old woman with NSTEMI to start on IV heparin. Goal of Therapy:  Heparin level 0.3-0.7 Monitor platelets by anticoagulation protocol: Yes   Plan:  Give 4000 units bolus x 1 Start heparin infusion at 1000 units/hr Check anti-Xa level in 6 hours and daily while on heparin Continue to monitor H&H and platelets   Gearldine Bienenstock Dream Nodal 07/25/2013,5:02 PM

## 2013-07-25 NOTE — Progress Notes (Signed)
Pt arrived to unit per stretcher at 2100, denies CP but c/o vague discomfort in back and abd.  Amb to bathroom, back to bed, H&P questions answered w/ mild sob on 2L.  NS infusing at 75 cc/hr, turned down to 10 cc/hr, NTG at 10 mcg/min and Heparin 1000 units/hr.  Watery stool on BSC, states that is 3rd BM today, usually goes once/day. BP 136/86.  Tele shows ST 120 at rest, 140 w/ coughing. Lungs very diminished, faint wheezing on left, freq dry hacking cough.  Over next hour and half, pt became much more sob, coughing very frequently,  RR 40/min, restless, denying cp but c/o low back pain.  O2 sat on 4L 95%.  Dr. Aundra Dubin notified, orders received.  Too SOB to use bedpan/BSC, foley inserted by Gevena Barre RN, CYU returned.  IV lasix given.  Pt becoming much more anxious, did not tolerate lying down for foley well, BP 175/111.  Dr Aundra Dubin called again, more orders received.  Robitussin and KCl given, pt refused xanax.  BP down to 164/84 with increase of NTG to 20 mcg/min, SL NTG not given.  Pt calming some, RR 32/min, 95% on 6L per [place].  Foley 200 cc out.

## 2013-07-25 NOTE — ED Notes (Signed)
Diet tray ordered 

## 2013-07-25 NOTE — ED Notes (Signed)
Pt to ED via [facility] for evaluation of shortness of breath onset today after exercising- tightness in chest associated with symptoms.  Pt reports she has had pain like this before and was dx with reflux, pt took reflux medication today without relief.  EMS noted lungs to be diminished- neb treatment started en route.  Pt reports to be short of breath upon arrival to ED, speaking in full sentences without difficulty at present.

## 2013-07-25 NOTE — H&P (Addendum)
 Cardiologist: Croitoru  Melinda [name] is an 79 y.o. female.   Chief Complaint:  Chest pain HPI:   The patient is a 79-year-old female with a history of hyperlipidemia, hypertension, gastroesophageal reflux disease, macular degeneration, bilateral breast cancer.  The patient had a nuclear stress test 09/17/2012 was normal with an ejection fraction of 61%.   She presents today with chest pain.  Troponin is elevated.  She reports developing Cp while at [employer] before 1100hrs today.  The pain felt like a squeezing, band around her chest.  It radiated up to her jaw and she was "clammy" with SOB, dizziness.  She has an allergy to ASA.  She tried taking an antacid without relief.  The patient currently denies nausea, vomiting, fever, orthopnea, PND, congestion, abdominal pain, hematochezia, melena, lower extremity edema, claudication.     Past Medical History  Diagnosis Date  . Hyperlipidemia   . Allergy   . Hypertension   . Cancer     bilateral breast  . Osteopenia   . GERD (gastroesophageal reflux disease)   . Macular degeneration     right eye    Past Surgical History  Procedure Laterality Date  . Breast lumpectomy  04/19/04    left with sentinel node  . Abdominal hysterectomy  1984  . Breast biopsy  04/29/05    right   . Cataract extraction      bilateral  . Retinal laser procedure      right    Family History  Problem Relation Age of Onset  . Dementia Mother   . Stroke Mother   . Heart disease Mother 75  . Cancer Father     Prostate  . Heart disease Father 32  . Cancer Maternal Aunt     Breast  . Cancer Maternal Grandmother   . Heart attack Maternal Grandfather   . Heart attack Paternal Grandfather    Social History:  reports that she has quit smoking. She has never used smokeless tobacco. She reports that she drinks alcohol. She reports that she does not use illicit drugs.  Allergies:  Allergies  Allergen Reactions  . Nsaids Other (See Comments)   Extreme hives and difficulty breathing. "Has had to go to the ER."  . Salicylates Other (See Comments)    Extreme hives and difficulty breathing. "Has had to go to the ER."     (Not in a hospital admission)  Results for orders placed during the hospital encounter of 07/25/13 (from the past 48 hour(s))  CBC WITH DIFFERENTIAL     Status: Abnormal   Collection Time    07/25/13  3:55 PM      Result Value Ref Range   WBC 11.7 (*) 4.0 - 10.5 K/uL   RBC 4.32  3.87 - 5.11 MIL/uL   Hemoglobin 14.4  12.0 - 15.0 g/dL   HCT 42.5  36.0 - 46.0 %   MCV 98.4  78.0 - 100.0 fL   MCH 33.3  26.0 - 34.0 pg   MCHC 33.9  30.0 - 36.0 g/dL   RDW 12.6  11.5 - 15.5 %   Platelets 321  150 - 400 K/uL   Neutrophils Relative % 78 (*) 43 - 77 %   Neutro Abs 9.2 (*) 1.7 - 7.7 K/uL   Lymphocytes Relative 16  12 - 46 %   Lymphs Abs 1.8  0.7 - 4.0 K/uL   Monocytes Relative 5  3 - 12 %   Monocytes Absolute 0.6    0.1 - 1.0 K/uL   Eosinophils Relative 1  0 - 5 %   Eosinophils Absolute 0.1  0.0 - 0.7 K/uL   Basophils Relative 0  0 - 1 %   Basophils Absolute 0.0  0.0 - 0.1 K/uL  COMPREHENSIVE METABOLIC PANEL     Status: Abnormal   Collection Time    07/25/13  3:55 PM      Result Value Ref Range   Sodium 130 (*) 137 - 147 mEq/L   Potassium 3.4 (*) 3.7 - 5.3 mEq/L   Chloride 90 (*) 96 - 112 mEq/L   CO2 22  19 - 32 mEq/L   Glucose, Bld 149 (*) 70 - 99 mg/dL   BUN 10  6 - 23 mg/dL   Creatinine, Ser 0.47 (*) 0.50 - 1.10 mg/dL   Calcium 9.4  8.4 - 10.5 mg/dL   Total Protein 7.6  6.0 - 8.3 g/dL   Albumin 3.7  3.5 - 5.2 g/dL   AST 25  0 - 37 U/L   ALT 18  0 - 35 U/L   Alkaline Phosphatase 89  39 - 117 U/L   Total Bilirubin 0.4  0.3 - 1.2 mg/dL   GFR calc non Af Amer >90  >90 mL/min   GFR calc Af Amer >90  >90 mL/min   Comment: (NOTE)     The eGFR has been calculated using the CKD EPI equation.     This calculation has not been validated in all clinical situations.     eGFR's persistently <90 mL/min signify  possible Chronic Kidney     Disease.  TROPONIN I     Status: Abnormal   Collection Time    07/25/13  3:55 PM      Result Value Ref Range   Troponin I 1.30 (*) <0.30 ng/mL   Comment:            Due to the release kinetics of cTnI,     a negative result within the first hours     of the onset of symptoms does not rule out     myocardial infarction with certainty.     If myocardial infarction is still suspected,     repeat the test at appropriate intervals.     CRITICAL RESULT CALLED TO, READ BACK BY AND VERIFIED WITH:     Donnie Mesa 1639 07/25/13 D BRADLEY   No results found.  Review of Systems  Constitutional: Positive for diaphoresis ("Clammy"). Negative for fever.  Respiratory: Positive for cough and shortness of breath. Negative for sputum production.   Cardiovascular: Positive for chest pain (With radiation to her jaw). Negative for orthopnea, leg swelling and PND.  Gastrointestinal: Negative for nausea, vomiting, abdominal pain, blood in stool and melena.  Genitourinary: Negative for hematuria.  Neurological: Negative for dizziness.  All other systems reviewed and are negative.   Blood pressure 120/78, pulse 115, temperature 98.3 F (36.8 C), temperature source Oral, resp. rate 24, height 5' 3" (1.6 m), weight 192 lb (87.091 kg), SpO2 93.00%. Physical Exam  Nursing note and vitals reviewed. Constitutional: She is oriented to person, place, and time. She appears well-developed. No distress.  Obese  HENT:  Head: Normocephalic and atraumatic.  Mouth/Throat: Oropharynx is clear and moist. No oropharyngeal exudate.  Eyes: EOM are normal. Pupils are equal, round, and reactive to light. No scleral icterus.  Neck: Normal range of motion. Neck supple.  Cardiovascular: Regular rhythm, S1 normal and S2 normal.  Tachycardia present.  Pulses:      Radial pulses are 2+ on the right side, and 2+ on the left side.       Dorsalis pedis pulses are 2+ on the right side, and 2+ on the left  side.  No carotid bruit  Respiratory: Effort normal and breath sounds normal. She has no wheezes. She has no rales.  GI: Soft. Bowel sounds are normal. She exhibits no distension. There is no tenderness.  Musculoskeletal: She exhibits no edema.  Lymphadenopathy:    She has no cervical adenopathy.  Neurological: She is alert and oriented to person, place, and time. She exhibits normal muscle tone.  Skin: Skin is warm and dry.  Psychiatric: She has a normal mood and affect.     Assessment/Plan Principal Problem:   NSTEMI (non-ST elevated myocardial infarction) Active Problems:   Chest pain   Essential hypertension   Hyperlipidemia   hyponatremia  Plan:  NSTEMI.  EKG with lateral TWI.  The patient was started on IV heparin and NTG.  She has an allergy to ASA.  Continue statin.  Add lopressor 12.5mg BID.  Holding ACE/HCTZ before cath.  LHC tomorrow.    Bryan Hager 07/25/2013, 4:51 PM   I have examined the patient and reviewed assessment and plan and discussed with patient.  Agree with above as stated.  NSTEMI.  Discussed cath with the patient and family.  All questions answered.  Jayadeep S Varanasi  

## 2013-07-25 NOTE — ED Provider Notes (Signed)
CSN: [id number]     Arrival date & time 07/25/13  1528 History   First MD Initiated Contact with Patient 07/25/13 1538     Chief Complaint  Patient presents with  . Shortness of Breath     (Consider location/radiation/quality/duration/timing/severity/associated sxs/prior Treatment) Patient is a 79 y.o. female presenting with shortness of breath. The history is provided by the patient and the spouse. No language interpreter was used.  Shortness of Breath Severity:  Moderate Duration:  5 hours Associated symptoms: chest pain   Associated symptoms: no abdominal pain, no cough, no fever and no vomiting   Associated symptoms comment:  She started having chest pressure this morning while at an exercise class (seated exercise). She has a history of GERD and attributed her symptoms to this. She went home and has been using TUMs all afternoon. She became short of breath this afternoon prompting her visit to the ED for evaluation. No nausea or vomiting. She has been her usual state of health lately without recent illness, cough or fever. She reports having an evaluation by Dr. Sallyanne Kuster last year for chest discomfort, evaluated by stress testing in 6/14, which she reports as negative.    Past Medical History  Diagnosis Date  . Hyperlipidemia   . Allergy   . Hypertension   . Cancer     bilateral breast  . Osteopenia   . GERD (gastroesophageal reflux disease)   . Macular degeneration     right eye   Past Surgical History  Procedure Laterality Date  . Breast lumpectomy  04/19/04    left with sentinel node  . Abdominal hysterectomy  1984  . Breast biopsy  04/29/05    right   . Cataract extraction      bilateral  . Retinal laser procedure      right   Family History  Problem Relation Age of Onset  . Dementia Mother   . Stroke Mother   . Heart disease Mother 70  . Cancer Father     Prostate  . Heart disease Father 89  . Cancer Maternal Aunt     Breast  . Cancer Maternal Grandmother    . Heart attack Maternal Grandfather   . Heart attack Paternal Grandfather    History  Substance Use Topics  . Smoking status: Former Research scientist (life sciences)  . Smokeless tobacco: Never Used  . Alcohol Use: Yes     Comment: 2-3 drinks per night   OB History   Grav Para Term Preterm Abortions TAB SAB Ect Mult Living                 Review of Systems  Constitutional: Negative for fever.  Respiratory: Positive for shortness of breath. Negative for cough.   Cardiovascular: Positive for chest pain. Negative for palpitations and leg swelling.  Gastrointestinal: Negative for nausea, vomiting and abdominal pain.  Genitourinary: Negative.   Neurological: Negative.       Allergies  Nsaids and Salicylates  Home Medications   Prior to Admission medications   Medication Sig Start Date End Date Taking? Authorizing Provider  Acetaminophen (TYLENOL EXTRA STRENGTH PO) Take 2 tablets by mouth daily.   Yes Historical Provider, MD  atorvastatin (LIPITOR) 20 MG tablet Take 20 mg by mouth daily.     Yes Historical Provider, MD  Brimonidine Tartrate (ALPHAGAN P OP) Apply to eye 2 (two) times daily.     Yes Historical Provider, MD  fexofenadine (ALLEGRA) 180 MG tablet Take 180 mg by mouth daily.  Yes Historical Provider, MD  HYDROcodone-acetaminophen (NORCO) 5-325 MG per tablet Take 1 tablet by mouth daily.  01/09/11  Yes Historical Provider, MD  Multiple Vitamins-Minerals (ICAPS) TABS Take 1 tablet by mouth daily.   Yes Historical Provider, MD  olmesartan-hydrochlorothiazide (BENICAR HCT) 20-12.5 MG per tablet Take 1 tablet by mouth daily.   Yes Historical Provider, MD   BP 125/95  Pulse 122  Temp(Src) 98.3 F (36.8 C) (Oral)  Resp 24  Ht 5\' 3"  (1.6 m)  Wt 192 lb (87.091 kg)  BMI 34.02 kg/m2  SpO2 99% Physical Exam  Constitutional: She is oriented to person, place, and time. She appears well-developed and well-nourished. No distress.  HENT:  Head: Normocephalic.  Eyes: Conjunctivae are normal.  No  conjunctival pallor.  Neck: Normal range of motion. Neck supple.  Cardiovascular: Regular rhythm.  Tachycardia present.   Pulmonary/Chest: Effort normal and breath sounds normal.  Abdominal: Soft. Bowel sounds are normal. There is no tenderness. There is no rebound and no guarding.  Musculoskeletal: Normal range of motion. She exhibits no edema.  Neurological: She is alert and oriented to person, place, and time.  Skin: Skin is warm and dry. No rash noted.  Psychiatric: She has a normal mood and affect.    ED Course  Procedures (including critical care time) Labs Review Labs Reviewed  CBC WITH DIFFERENTIAL - Abnormal; Notable for the following:    WBC 11.7 (*)    Neutrophils Relative % 78 (*)    Neutro Abs 9.2 (*)    All other components within normal limits  COMPREHENSIVE METABOLIC PANEL - Abnormal; Notable for the following:    Sodium 130 (*)    Potassium 3.4 (*)    Chloride 90 (*)    Glucose, Bld 149 (*)    Creatinine, Ser 0.47 (*)    All other components within normal limits  TROPONIN I - Abnormal; Notable for the following:    Troponin I 1.30 (*)    All other components within normal limits   Results for orders placed during the hospital encounter of 07/25/13  CBC WITH DIFFERENTIAL      Result Value Ref Range   WBC 11.7 (*) 4.0 - 10.5 K/uL   RBC 4.32  3.87 - 5.11 MIL/uL   Hemoglobin 14.4  12.0 - 15.0 g/dL   HCT 42.5  36.0 - 46.0 %   MCV 98.4  78.0 - 100.0 fL   MCH 33.3  26.0 - 34.0 pg   MCHC 33.9  30.0 - 36.0 g/dL   RDW 12.6  11.5 - 15.5 %   Platelets 321  150 - 400 K/uL   Neutrophils Relative % 78 (*) 43 - 77 %   Neutro Abs 9.2 (*) 1.7 - 7.7 K/uL   Lymphocytes Relative 16  12 - 46 %   Lymphs Abs 1.8  0.7 - 4.0 K/uL   Monocytes Relative 5  3 - 12 %   Monocytes Absolute 0.6  0.1 - 1.0 K/uL   Eosinophils Relative 1  0 - 5 %   Eosinophils Absolute 0.1  0.0 - 0.7 K/uL   Basophils Relative 0  0 - 1 %   Basophils Absolute 0.0  0.0 - 0.1 K/uL  COMPREHENSIVE  METABOLIC PANEL      Result Value Ref Range   Sodium 130 (*) 137 - 147 mEq/L   Potassium 3.4 (*) 3.7 - 5.3 mEq/L   Chloride 90 (*) 96 - 112 mEq/L   CO2 22  19 -  32 mEq/L   Glucose, Bld 149 (*) 70 - 99 mg/dL   BUN 10  6 - 23 mg/dL   Creatinine, Ser 0.47 (*) 0.50 - 1.10 mg/dL   Calcium 9.4  8.4 - 10.5 mg/dL   Total Protein 7.6  6.0 - 8.3 g/dL   Albumin 3.7  3.5 - 5.2 g/dL   AST 25  0 - 37 U/L   ALT 18  0 - 35 U/L   Alkaline Phosphatase 89  39 - 117 U/L   Total Bilirubin 0.4  0.3 - 1.2 mg/dL   GFR calc non Af Amer >90  >90 mL/min   GFR calc Af Amer >90  >90 mL/min  TROPONIN I      Result Value Ref Range   Troponin I 1.30 (*) <0.30 ng/mL    Imaging Review No results found.   EKG Interpretation None      MDM   Final diagnoses:  None    1. Chest pain 2. Tachycardia 3. Elevated troponin  Discussed with Tennis Must for cardiology, who will see in ED. The patient appears comfortable, remains tachycardic on the monitor. Patient care discussed with Dr. Canary Brim. EKG reviewed and there is no determinate sign of ischemia.   Troponin elevated - cardiology consulted. Heparin and NTG ordered.     Dewaine Oats, PA-C 07/25/13 1657

## 2013-07-25 NOTE — ED Provider Notes (Addendum)
Medical screening examination/treatment/procedure(s) were conducted as a shared visit with non-physician practitioner(s) and myself.  I personally evaluated the patient during the encounter.  Pt with chest pain while exercising today then devloped shortness of breath.  Seh continues to have mild chest pain in the ED. Troponin elevated.  EKG with sinus tachy but no acute ST/T wave changes.  Nitro drip and heparin ordered.  Cardiology consulted and is planning to take patient for catheterization.     EKG Interpretation None      Date: 07/25/2013  Rate: 126  Rhythm: sinus tachycardia  QRS Axis: normal  Intervals: normal  ST/T Wave abnormalities: nonspecific T wave changes  Conduction Disutrbances:none  Narrative Interpretation:   Old EKG Reviewed: rate faster, nonspecific t wave changes appear new  CRITICAL CARE Performed by: Threasa Beards Total critical care time: 35 Critical care time was exclusive of separately billable procedures and treating other patients. Critical care was necessary to treat or prevent imminent or life-threatening deterioration. Critical care was time spent personally by me on the following activities: development of treatment plan with patient and/or surrogate as well as nursing, discussions with consultants, evaluation of patient's response to treatment, examination of patient, obtaining history from patient or surrogate, ordering and performing treatments and interventions, ordering and review of laboratory studies, ordering and review of radiographic studies, pulse oximetry and re-evaluation of patient's condition.  Threasa Beards, MD 07/25/13 [place], MD 07/25/13 [phone]

## 2013-07-26 ENCOUNTER — Encounter (HOSPITAL_COMMUNITY): Payer: Self-pay | Admitting: Interventional Cardiology

## 2013-07-26 ENCOUNTER — Encounter (HOSPITAL_COMMUNITY)
Admission: EM | Disposition: A | Discharge status: Home / Self Care | Payer: Self-pay | Source: Home / Self Care | Attending: Cardiology

## 2013-07-26 DIAGNOSIS — I5181 Takotsubo syndrome: Secondary | ICD-10-CM | POA: Diagnosis not present

## 2013-07-26 DIAGNOSIS — I2510 Atherosclerotic heart disease of native coronary artery without angina pectoris: Secondary | ICD-10-CM | POA: Diagnosis not present

## 2013-07-26 DIAGNOSIS — E871 Hypo-osmolality and hyponatremia: Secondary | ICD-10-CM | POA: Diagnosis not present

## 2013-07-26 DIAGNOSIS — E785 Hyperlipidemia, unspecified: Secondary | ICD-10-CM | POA: Diagnosis not present

## 2013-07-26 DIAGNOSIS — I214 Non-ST elevation (NSTEMI) myocardial infarction: Secondary | ICD-10-CM | POA: Diagnosis not present

## 2013-07-26 HISTORY — PX: LEFT HEART CATHETERIZATION WITH CORONARY ANGIOGRAM: SHX5451

## 2013-07-26 LAB — LIPID PANEL
Cholesterol: 159 mg/dL (ref 0–200)
HDL: 89 mg/dL (ref >39)
LDL CALC: 53 mg/dL (ref 0–99)
TRIGLYCERIDES: 86 mg/dL (ref <150)
Total CHOL/HDL Ratio: 1.8 RATIO
VLDL: 17 mg/dL (ref 0–40)

## 2013-07-26 LAB — CBC
HCT: 36.6 % (ref 36.0–46.0)
HEMOGLOBIN: 12.3 g/dL (ref 12.0–15.0)
MCH: 32.7 pg (ref 26.0–34.0)
MCHC: 33.6 g/dL (ref 30.0–36.0)
MCV: 97.3 fL (ref 78.0–100.0)
Platelets: 283 10*3/uL (ref 150–400)
RBC: 3.76 MIL/uL — ABNORMAL LOW (ref 3.87–5.11)
RDW: 12.7 % (ref 11.5–15.5)
WBC: 10.6 10*3/uL — ABNORMAL HIGH (ref 4.0–10.5)

## 2013-07-26 LAB — BASIC METABOLIC PANEL
BUN: 7 mg/dL (ref 6–23)
CALCIUM: 8.7 mg/dL (ref 8.4–10.5)
CO2: 26 meq/L (ref 19–32)
Chloride: 94 mEq/L — ABNORMAL LOW (ref 96–112)
Creatinine, Ser: 0.52 mg/dL (ref 0.50–1.10)
GFR calc Af Amer: >90 mL/min (ref >90)
Glucose, Bld: 125 mg/dL — ABNORMAL HIGH (ref 70–99)
Potassium: 3.6 mEq/L — ABNORMAL LOW (ref 3.7–5.3)
SODIUM: 133 meq/L — AB (ref 137–147)

## 2013-07-26 LAB — TROPONIN I
TROPONIN I: 1.83 ng/mL — AB (ref <0.30)
Troponin I: 2.15 ng/mL (ref <0.30)

## 2013-07-26 LAB — PROTIME-INR
INR: 0.99 (ref 0.00–1.49)
Prothrombin Time: 12.9 seconds (ref 11.6–15.2)

## 2013-07-26 LAB — HEPARIN LEVEL (UNFRACTIONATED): Heparin Unfractionated: 0.3 IU/mL (ref 0.30–0.70)

## 2013-07-26 LAB — MRSA PCR SCREENING: MRSA BY PCR: NEGATIVE

## 2013-07-26 LAB — HEMOGLOBIN A1C
Hgb A1c MFr Bld: 5.7 % — ABNORMAL HIGH (ref <5.7)
Mean Plasma Glucose: 117 mg/dL — ABNORMAL HIGH (ref <117)

## 2013-07-26 LAB — TSH: TSH: 4.72 u[IU]/mL — AB (ref 0.350–4.500)

## 2013-07-26 SURGERY — LEFT HEART CATHETERIZATION WITH CORONARY ANGIOGRAM
Anesthesia: LOCAL

## 2013-07-26 MED ORDER — HEPARIN (PORCINE) IN NACL 2-0.9 UNIT/ML-% IJ SOLN
INTRAMUSCULAR | Status: AC
  Filled 2013-07-26: qty 1000

## 2013-07-26 MED ORDER — SODIUM CHLORIDE 0.9 % IV SOLN
500.0000 mL | Freq: Once | INTRAVENOUS | Status: AC
  Administered 2013-07-26: 200 mL via INTRAVENOUS

## 2013-07-26 MED ORDER — HEPARIN (PORCINE) IN NACL 100-0.45 UNIT/ML-% IJ SOLN
1200.0000 [IU]/h | INTRAMUSCULAR | Status: DC
  Filled 2013-07-26: qty 250

## 2013-07-26 MED ORDER — HEPARIN SODIUM (PORCINE) 1000 UNIT/ML IJ SOLN
INTRAMUSCULAR | Status: AC
Start: 2013-07-26 — End: 2013-07-26
  Filled 2013-07-26: qty 1

## 2013-07-26 MED ORDER — METOPROLOL TARTRATE 25 MG PO TABS
25.0000 mg | ORAL_TABLET | Freq: Two times a day (BID) | ORAL | Status: DC
  Administered 2013-07-26 – 2013-07-27 (×3): 25 mg via ORAL
  Filled 2013-07-26 (×4): qty 1

## 2013-07-26 MED ORDER — HEART ATTACK BOUNCING BOOK
Freq: Once | Status: AC
  Administered 2013-07-26: 07:00:00
  Filled 2013-07-26: qty 1

## 2013-07-26 MED ORDER — ACETAMINOPHEN 325 MG PO TABS
650.0000 mg | ORAL_TABLET | Freq: Four times a day (QID) | ORAL | Status: DC | PRN
  Administered 2013-07-26: 650 mg via ORAL
  Filled 2013-07-26: qty 2

## 2013-07-26 MED ORDER — LIDOCAINE HCL (PF) 1 % IJ SOLN
INTRAMUSCULAR | Status: AC
  Filled 2013-07-26: qty 30

## 2013-07-26 MED ORDER — BIOTENE DRY MOUTH MT LIQD
15.0000 mL | Freq: Two times a day (BID) | OROMUCOSAL | Status: DC
  Administered 2013-07-26 (×2): 15 mL via OROMUCOSAL

## 2013-07-26 MED ORDER — NITROGLYCERIN 0.2 MG/ML ON CALL CATH LAB
INTRAVENOUS | Status: AC
Start: 2013-07-26 — End: 2013-07-26
  Filled 2013-07-26: qty 1

## 2013-07-26 MED ORDER — SODIUM CHLORIDE 0.9 % IV SOLN: INTRAVENOUS | Status: DC

## 2013-07-26 MED ORDER — VERAPAMIL HCL 2.5 MG/ML IV SOLN
INTRAVENOUS | Status: AC
Start: 2013-07-26 — End: 2013-07-26
  Filled 2013-07-26: qty 2

## 2013-07-26 MED ORDER — FENTANYL CITRATE 0.05 MG/ML IJ SOLN
INTRAMUSCULAR | Status: AC
  Filled 2013-07-26: qty 2

## 2013-07-26 MED ORDER — MIDAZOLAM HCL 2 MG/2ML IJ SOLN
INTRAMUSCULAR | Status: AC
  Filled 2013-07-26: qty 2

## 2013-07-26 MED ORDER — HYDROCODONE-ACETAMINOPHEN 5-325 MG PO TABS: 1.0000 | ORAL_TABLET | Freq: Every day | ORAL | Status: DC

## 2013-07-26 NOTE — Progress Notes (Signed)
ANTICOAGULATION CONSULT NOTE - Follow Up Consult  Pharmacy Consult for heparin Indication: NSTEMI  Labs:  Recent Labs  07/25/13 1555 07/25/13 1912 07/25/13 2309  HGB 14.4  --   --   HCT 42.5  --   --   PLT 321  --   --   HEPARINUNFRC  --   --  0.30  CREATININE 0.47*  --   --   TROPONINI 1.30* 2.44*  --     Assessment: 79yo female therapeutic on heparin with initial dosing for NSTEMI though at very low end of goal.  Goal of Therapy:  Heparin level 0.3-0.7 units/ml   Plan:  Will increase heparin gtt slightly to 1100 units/hr and check level with next troponin.  Wynona Neat, PharmD, BCPS  07/26/2013,12:46 AM

## 2013-07-26 NOTE — H&P (Signed)
Patient's chart reviewed and patient seen, interviewed and examined.  SHe has a history of HTN and dyslipidemia and had a nuclear stress test a year ago for atypical CP and jaw pain which was normal.  SHe developed CP at [employer] but went home so had CP for several hours prior to presenting to ER.  Currently CP free.  Cardiac enzymes elevated c/w NSTEMI and EKG with nonspecific ST abnormality.  Will admit to step down unit and start IV Heparin.  She is allergic to ASA with hives and difficulty breathing so will not give ASA.  Add low dose beta blocker and plan cardiac cath in am.

## 2013-07-26 NOTE — Progress Notes (Signed)
UR completed Kalyani Maeda K. Tayla Panozzo, RN, BSN, MSHL, CCM  07/26/2013 11:35 AM

## 2013-07-26 NOTE — CV Procedure (Signed)
       PROCEDURE:  Left heart catheterization with selective coronary angiography, left ventriculogram.  INDICATIONS:  NSTEMI  The risks, benefits, and details of the procedure were explained to the patient.  The patient verbalized understanding and wanted to proceed.  Informed written consent was obtained.  PROCEDURE TECHNIQUE:  After Xylocaine anesthesia a 31F slender sheath was placed in the right radial artery with a single anterior needle wall stick.  IV heparin was given. Right coronary angiography was done using a Judkins R4 guide catheter.  Left coronary angiography was done using a Judkins L3.5 guide catheter.  At this point, we thought intervention on the obtuse marginal may be indicated. Additional heparin was given.  Left ventriculography was done using a pigtail catheter.  After seeing the findings of the ventriculogram, we decided that intervention was not warranted. A TR band was used for hemostasis.   CONTRAST:  Total of 75 cc.  COMPLICATIONS:  None.    HEMODYNAMICS:  Aortic pressure was 105/56; LV pressure was 108/15; LVEDP 22.  There was no gradient between the left ventricle and aorta.    ANGIOGRAPHIC DATA:   The left main coronary artery is widely patent.  The left anterior descending artery is a large vessel which wraps around the apex. In the proximal vessel, there is a 30%, eccentric stenosis. There is mild calcification. There are several small diagonals which are patent..  The left circumflex artery is a large vessel. There is a medium size ramus which is patent. There are mild irregularities in the circumflex. The first significant obtuse marginal has an ostial 75% stenosis. The next obtuse marginal is patent.  The right coronary artery is a medium size vessel. The posterior lateral artery is large and widely patent. The posterior descending artery a small but patent.  There does not appear to be any significant atherosclerosis in the RCA system.  LEFT  VENTRICULOGRAM:  Left ventricular angiogram was done in the 30 RAO projection and revealed severely decreased left ventricular systolic function with an estimated ejection fraction of 25 %.  There is severe hypokinesis of the mid to distal anterior wall. There is severe hypokinesis of the mid to distal inferior wall. The base contracts vigorously. There is some contraction at the apex. LVEDP was 22 mmHg.  IMPRESSIONS:  1. Normal left main coronary artery. 2. Mild disease in the proximal left anterior descending artery. 3. Patent  left circumflex artery and its branches.. 75%, ostial stenosis of the OM1. The degree of LV dysfunction was well out of proportion to this stenosis. 4. Widely patent right coronary artery. 5. Severely decreased left ventricular systolic function in a pattern of Takatsubo cardiomyopathy.  LVEDP 22 mmHg.  Ejection fraction 25 %.  RECOMMENDATION:  Medical therapy for the left ventricular dysfunction. This appears to be a stress-induced cardiomyopathy. The wall motion abnormalities cannot be explained by her coronary disease. I suspect that after a few weeks of medical therapy, her LV function will be back to normal. Of note, her only stressful event was exercising yesterday. She had been sick about a week ago with a GI illness. She does not recall any stressful life events that happened.  Followup with Dr. Sallyanne Kuster.

## 2013-07-26 NOTE — Progress Notes (Signed)
ANTICOAGULATION CONSULT NOTE - Follow Up Consult  Pharmacy Consult for heparin Indication: NSTEMI  Labs:  Recent Labs  07/25/13 1555 07/25/13 1912 07/25/13 2309 07/26/13 0100 07/26/13 0718  HGB 14.4  --   --   --  12.3  HCT 42.5  --   --   --  36.6  PLT 321  --   --   --  283  LABPROT  --   --   --   --  12.9  INR  --   --   --   --  0.99  HEPARINUNFRC  --   --  0.30  --  0.30  CREATININE 0.47*  --   --   --  0.52  TROPONINI 1.30* 2.44*  --  2.15* 1.83*    Assessment: 79yo female on heparin infusion for NSTEMI. This morning the heparin level remains at 0.3, therapeutic though at low end of goal. Hgb 12.3, PLTC 283K. No bleeding noted.  Goal of Therapy:  Heparin level 0.3-0.7 units/ml   Plan:  Will increase heparin gtt slightly to 1200 units/hr.  Daily heparin level and CBC.   Nicole Cella, RPh Clinical Pharmacist Pager: [phone] 07/26/2013,8:31 AM

## 2013-07-26 NOTE — Care Management Note (Addendum)
  Page 2 of 2   07/27/2013     6:04:06 PM CARE MANAGEMENT NOTE 07/27/2013  Patient:  Melinda [name], Melinda [name]   Account Number:  [id number]  Date Initiated:  07/26/2013  Documentation initiated by:  Mariann Laster  Subjective/Objective Assessment:   Admitted with CP and NSTEMI     Action/Plan:   CM to follow for disposition needs   Anticipated DC Date:  07/26/2013   Anticipated DC Plan:  [place]  CM consult      Choice offered to / List presented to:     DME arranged  [facility]      DME agency  Melinda [name].        Status of service:  Completed, signed off Medicare Important Message given?  YES (If response is "NO", the following Medicare IM given date fields will be blank) Date Medicare IM given:  07/25/2013 Date Additional Medicare IM given:    Discharge Disposition:  HOME/SELF CARE  Per UR Regulation:  Reviewed for med. necessity/level of care/duration of stay  If discussed at Melinda [name] of Stay Meetings, dates discussed:    Comments:  07/27/2013 Oxygen not approved under MCR guidelines d/t no supporting dx even though sats qualify. [facility] / Brenton Grills has discussed cost with patient.  Patient elects to self pay for home oxygen and protable tank delivered to room to prepare for discharge. Disposition Plan:  Home / [place] RN, BSN, [place], CCM  Nurse - Case Manager, [phone] [phone]  07/27/2013  07/27/13 [place], RN, BSN, [place] [phone] DME oxygen ordered through [facility].  Melinda [name] notified to delive oxygen to pt room prior to d/c home today. SATURATION QUALIFICATIONS: Patient Saturations on Room Air at Rest = 89% Patient Saturations on [employer] while Ambulating = 87% Patient Saturations on 2 Liters of oxygen while Ambulating = 95% Please briefly explain why patient needs home oxygen: desat with walking  Admitted with CP and NSTEMI Med Review:  No med indicating CM  assistance at this time. Per hand off report "---07/26/2013 0937 by Louie Bun--- Cat[place]/ Medical therapy for the left ventricular dysfunction. This appears to be a stress-induced cardiomyopathy/ inpt documented" PCP:  Dr. Osborne Casco Initial IM:  07/25/2013 / 3:55:45 PM Melinda Sacca RN, BSN, [place], CCM 07/26/2013

## 2013-07-26 NOTE — Progress Notes (Signed)
Pt feeling fine at first assessment tonight (1930) but now patient SOB and c/o "squeezing again around here" pointing to breasts and lower ribs, rates 5/10.  Tele shows ST 100's.  Lungs diminished w/ faint crackles left base, more air movement than last night.  Pt mildly anxious, refused vicodin, offered xanax and patient did want that.  Xanax and metoprolol given.  O2 sat 96% on 2L. Tolerated use of bsc fair w/o significant increase in SOB.  Recheck after xanax pt states discomfort is down to 2 and feels like she can rest now.  HR down to 80's.

## 2013-07-26 NOTE — Progress Notes (Signed)
Subjective: Feeling better this morning.  Objective: Vital signs in last 24 hours: Temp:  [97.9 F (36.6 C)-98.3 F (36.8 C)] 98.3 F (36.8 C) (04/28 0429) Pulse Rate:  [69-130] 93 (04/28 0500) Resp:  [16-40] 24 (04/28 0429) BP: (56-175)/(29-111) 116/67 mmHg (04/28 0500) SpO2:  [93 %-100 %] 100 % (04/28 0500) Weight:  [192 lb (87.091 kg)-197 lb 12 oz (89.7 kg)] 197 lb 12 oz (89.7 kg) (04/28 0100) Last BM Date: 07/25/13  Intake/Output from previous day: 04/27 0701 - 04/28 0700 In: 771 [P.O.:220; I.V.:551] Out: 3662 [Urine:1375] Intake/Output this shift: Total I/O In: 711 [P.O.:220; I.V.:491] Out: 1075 [Urine:1075]  Medications Current Facility-Administered Medications  Medication Dose Route Frequency Provider Last Rate Last Dose  . 0.9 %  sodium chloride infusion  250 mL Intravenous PRN Tarri Fuller, PA-C      . 0.9 %  sodium chloride infusion   Intravenous Continuous Tarri Fuller, PA-C 10 mL/hr at 07/25/13 2116    . ALPRAZolam Duanne Moron) tablet 0.25 mg  0.25 mg Oral TID PRN Larey Dresser, MD   0.25 mg at 07/25/13 2339  . antiseptic oral rinse (BIOTENE) solution 15 mL  15 mL Mouth Rinse BID Sueanne Margarita, MD   15 mL at 07/26/13 0508  . atorvastatin (LIPITOR) tablet 20 mg  20 mg Oral q1800 Tarri Fuller, PA-C   20 mg at 07/25/13 2223  . guaiFENesin-dextromethorphan (ROBITUSSIN DM) 100-10 MG/5ML syrup 15 mL  15 mL Oral Q4H PRN Larey Dresser, MD   15 mL at 07/25/13 2336  . heart attack bouncing book   Does not apply Once Sueanne Margarita, MD      . heparin ADULT infusion 100 units/mL (25000 units/250 mL)  1,100 Units/hr Intravenous Continuous Rogue Bussing, RPH 11 mL/hr at 07/26/13 0200 1,100 Units/hr at 07/26/13 0200  . metoprolol tartrate (LOPRESSOR) tablet 12.5 mg  12.5 mg Oral BID Tarri Fuller, PA-C   12.5 mg at 07/25/13 2223  . nitroGLYCERIN (NITROSTAT) SL tablet 0.4 mg  0.4 mg Sublingual Once Larey Dresser, MD      . nitroGLYCERIN 0.2 mg/mL in dextrose 5 % infusion   2-200 mcg/min Intravenous Titrated Larey Dresser, MD 1.5 mL/hr at 07/26/13 0303 5 mcg/min at 07/26/13 0303  . sodium chloride 0.9 % injection 3 mL  3 mL Intravenous Q12H Tarri Fuller, PA-C   3 mL at 07/25/13 2254  . sodium chloride 0.9 % injection 3 mL  3 mL Intravenous PRN Tarri Fuller, PA-C   3 mL at 07/25/13 2306    PE: General appearance: alert, cooperative and no distress Lungs: Rales on the left Heart: regular rate and rhythm, S1, S2 normal, no murmur, click, rub or gallop Extremities: No LEE Pulses: 2+ and symmetric Skin: Warm and dry Neurologic: Grossly normal  Lab Results:   Recent Labs  07/25/13 1555  WBC 11.7*  HGB 14.4  HCT 42.5  PLT 321   BMET  Recent Labs  07/25/13 1555  NA 130*  K 3.4*  CL 90*  CO2 22  GLUCOSE 149*  BUN 10  CREATININE 0.47*  CALCIUM 9.4    Assessment/Plan  Principal Problem:   NSTEMI (non-ST elevated myocardial infarction) Active Problems:   Chest pain   Essential hypertension   Hyperlipidemia   Hypokalemia  Plan:   Feeling better this morning.  Troponin peaked at 2.44.  According to the RN pt became SOB.  She did not tolerate lying down for foley insertion.  Lasix was  given and NTG was increased.  Net fluids: -0.6L.  Potassium was replaced.  BP much better.  Will order 2D echo.  May need more lasix but she appears comfortable now and cath is at 0800hrs.   Labs pending.     LOS: 1 day    Tarri Fuller PA-C 07/26/2013 6:13 AM  I have examined the patient and reviewed assessment and plan and discussed with patient.  Agree with above as stated.  Patient had cath and was found to have Takatsubo Cardiomyopathy.  Cardiomyopathy out of proportion to degree of CAD.  Continue metoprolol.  If BP allows, start low dose ACE-I.  Will need diuretic at the time of d/c.  Jettie Booze

## 2013-07-26 NOTE — Progress Notes (Signed)
Pt feeling so much better, sat up in bed to brush teeth, states feels rested now.  RR 24, shallow.  Auscultated more air movement in lungs, fine crackles bil bases.  100% on 4L.  Tele SR 90's, BP 116/67, pain free on 5 mcg/min NTG. IVF remaining at [facility] until MD reassesses.  Discussed cardiac catheterization, pt refused education video.  Discussed angina and use of NTG SL use after d/c.  Pt describing more clearly her sensation last night as "squeezing" under breasts with radiation to jaw.  Call light in reach.  NPO.

## 2013-07-26 NOTE — Progress Notes (Signed)
Pt breathing so much better, calm & cooperative.  375 cc urine out per foley.  C/O abd "cramping", sat on BSC x 10 minutes before finally having soft bm. As getting back to bed pt began getting quiet, distant, c/o "woozy".  Boosted in bed, BP checked, 63/29.  NTG gtt turned off, bed into trendelenberg.  Tele showed HR down to 60's, EKG shows SR.  Pt never became unresponsive but did c/o feeling "hot and cold."  Skin pale & clammy.  200 cc NS bolus given, Rapid Response RN called.  BP up to 89/59. Pt still c/o very vague back discomfort, occas calls it "squeezing under the breasts".  Will resume NTG when BP allows.

## 2013-07-27 DIAGNOSIS — I5181 Takotsubo syndrome: Secondary | ICD-10-CM

## 2013-07-27 DIAGNOSIS — R079 Chest pain, unspecified: Secondary | ICD-10-CM

## 2013-07-27 DIAGNOSIS — I214 Non-ST elevation (NSTEMI) myocardial infarction: Secondary | ICD-10-CM | POA: Diagnosis not present

## 2013-07-27 MED ORDER — FUROSEMIDE 20 MG PO TABS
20.0000 mg | ORAL_TABLET | Freq: Every day | ORAL | Status: DC | PRN
  Filled 2013-07-27: qty 1

## 2013-07-27 MED ORDER — NITROGLYCERIN 0.4 MG SL SUBL: 0.4000 mg | SUBLINGUAL_TABLET | Freq: Once | SUBLINGUAL | Status: DC

## 2013-07-27 MED ORDER — POTASSIUM CHLORIDE CRYS ER 20 MEQ PO TBCR
40.0000 meq | EXTENDED_RELEASE_TABLET | Freq: Once | ORAL | Status: AC
  Administered 2013-07-27: 40 meq via ORAL
  Filled 2013-07-27: qty 2

## 2013-07-27 MED ORDER — FUROSEMIDE 20 MG PO TABS: 20.0000 mg | ORAL_TABLET | Freq: Every day | ORAL | Status: DC | PRN

## 2013-07-27 MED ORDER — ALPRAZOLAM 0.25 MG PO TABS: 0.2500 mg | ORAL_TABLET | Freq: Two times a day (BID) | ORAL | Status: DC | PRN

## 2013-07-27 MED ORDER — METOPROLOL TARTRATE 25 MG PO TABS: 25.0000 mg | ORAL_TABLET | Freq: Two times a day (BID) | ORAL | Status: DC

## 2013-07-27 NOTE — Progress Notes (Signed)
Dozing off and on, states "I feel great."  O2 sat 96% on 2L, resp unlabored.

## 2013-07-27 NOTE — Progress Notes (Signed)
Subjective: She reports some tightness below her breasts when coughing  Objective: Vital signs in last 24 hours: Temp:  [98 F (36.7 C)-99 F (37.2 C)] 98.3 F (36.8 C) (04/29 0326) Pulse Rate:  [76-114] 97 (04/29 0326) Resp:  [16-20] 20 (04/29 0326) BP: (91-125)/(43-75) 103/49 mmHg (04/29 0326) SpO2:  [86 %-100 %] 94 % (04/29 0326) Weight:  [198 lb 10.2 oz (90.1 kg)] 198 lb 10.2 oz (90.1 kg) (04/29 0001) Last BM Date: 07/25/13  Intake/Output from previous day: 04/28 0701 - 04/29 0700 In: 1050 [P.O.:800; I.V.:250] Out: 79 [Urine:1900] Intake/Output this shift:    Medications Current Facility-Administered Medications  Medication Dose Route Frequency Provider Last Rate Last Dose  . 0.9 %  sodium chloride infusion   Intravenous Continuous Jettie Booze, MD      . acetaminophen (TYLENOL) tablet 650 mg  650 mg Oral Q6H PRN Tarri Fuller, PA-C   650 mg at 07/26/13 1340  . ALPRAZolam Duanne Moron) tablet 0.25 mg  0.25 mg Oral TID PRN Larey Dresser, MD   0.25 mg at 07/26/13 2117  . antiseptic oral rinse (BIOTENE) solution 15 mL  15 mL Mouth Rinse BID Sueanne Margarita, MD   15 mL at 07/26/13 2119  . atorvastatin (LIPITOR) tablet 20 mg  20 mg Oral q1800 Tarri Fuller, PA-C   20 mg at 07/26/13 1930  . guaiFENesin-dextromethorphan (ROBITUSSIN DM) 100-10 MG/5ML syrup 15 mL  15 mL Oral Q4H PRN Larey Dresser, MD   15 mL at 07/25/13 2336  . HYDROcodone-acetaminophen (NORCO/VICODIN) 5-325 MG per tablet 1 tablet  1 tablet Oral Daily Tarri Fuller, PA-C      . metoprolol tartrate (LOPRESSOR) tablet 25 mg  25 mg Oral BID Tarri Fuller, PA-C   25 mg at 07/26/13 2119  . nitroGLYCERIN (NITROSTAT) SL tablet 0.4 mg  0.4 mg Sublingual Once Larey Dresser, MD      . nitroGLYCERIN 0.2 mg/mL in dextrose 5 % infusion  2-200 mcg/min Intravenous Titrated Larey Dresser, MD 1.5 mL/hr at 07/26/13 0700 5 mcg/min at 07/26/13 0700    PE: General appearance: alert, cooperative and no distress Lungs: Mild  rales Heart: regular rate and rhythm, S1, S2 normal, no murmur, click, rub or gallop Extremities: No LEE Pulses: 2+ and symmetric Skin: Warm and dry.  right wrist withour errythema Neurologic: Grossly normal  Lab Results:   Recent Labs  07/25/13 1555 07/26/13 0718  WBC 11.7* 10.6*  HGB 14.4 12.3  HCT 42.5 36.6  PLT 321 283   BMET  Recent Labs  07/25/13 1555 07/26/13 0718  NA 130* 133*  K 3.4* 3.6*  CL 90* 94*  CO2 22 26  GLUCOSE 149* 125*  BUN 10 7  CREATININE 0.47* 0.52  CALCIUM 9.4 8.7   PT/INR  Recent Labs  07/26/13 0718  LABPROT 12.9  INR 0.99   Cholesterol  Recent Labs  07/26/13 0718  CHOL 159   Lipid Panel     Component Value Date/Time   CHOL 159 07/26/2013 0718   TRIG 86 07/26/2013 0718   HDL 89 07/26/2013 0718   CHOLHDL 1.8 07/26/2013 0718   VLDL 17 07/26/2013 0718   LDLCALC 53 07/26/2013 0718   Cardiac Panel (last 3 results)  Recent Labs  07/25/13 1912 07/26/13 0100 07/26/13 0718  TROPONINI 2.44* 2.15* 1.83*        Assessment/Plan  Principal Problem:   NSTEMI (non-ST elevated myocardial infarction) Active Problems:   Chest pain   Essential hypertension  Hyperlipidemia   Takotsubo cardiomyopathy  Plan:   SP left heart cath revealing 75% ostial stenosis in the OM1 and severely decreased left ventricular systolic function in a pattern of Takatsubo cardiomyopathy. LVEDP 22 mmHg. Ejection fraction 25%.  Beta blocker was increased to 25mg  BID but I do not think her BP will tolerate a further increase.   She will need a follow up echo in three months.   We discussed daily weight monitoring.  Will send home with PRN lasix.     LOS: 2 days    Tarri Fuller PA-C 07/27/2013 7:08 AM    Patient seen and examined. Agree with assessment and plan. Feels well. No chest pain or dyspnea. BP low. Currently on BB; add ACE-I or resume ARB as BP allows. Radial site stable; ok for dc today.   Troy Sine, MD, [facility] 07/27/2013 8:12 AM

## 2013-07-27 NOTE — Discharge Summary (Addendum)
Physician Discharge Summary     Patient ID: Melinda [name] MRN: [id number] DOB/AGE: [birth year] 79 y.o.  Admit date: 07/25/2013 Discharge date: 07/27/2013  Admission Diagnoses:  NSTEMI  Discharge Diagnoses:  Principal Problem:   NSTEMI (non-ST elevated myocardial infarction) Active Problems:   Chest pain   Essential hypertension   Hyperlipidemia   Takotsubo cardiomyopathy   Discharged Condition: stable  Hospital Course:   The patient is a 79 year old female with a history of hyperlipidemia, hypertension, gastroesophageal reflux disease, macular degeneration, bilateral breast cancer. The patient had a nuclear stress test 09/17/2012 was normal with an ejection fraction of 61%. She presents today with chest pain. Troponin is elevated. She reports developing Cp while at [employer] before 1100hrs today. The pain felt like a squeezing, band around her chest. It radiated up to her jaw and she was "clammy" with SOB, dizziness. She has an allergy to ASA. She tried taking an antacid without relief. The patient currently denies nausea, vomiting, fever, orthopnea, PND, congestion, abdominal pain, hematochezia, melena, lower extremity edema, claudication.  She was admitted.  Troponin peaked at 2.44.  She was started on lopressor 12.5mg  bid and it was increased to 25mg .  She had a left heart cath which revealed 75% ostial stenosis in the OM1 and severely decreased left ventricular systolic function in a pattern of Takatsubo cardiomyopathy. LVEDP 22 mmHg. Ejection fraction 25%.  Her BP will not tolerate an ACE or ARB at this time.  We discussed daily weight monitoring. Will send home with PRN lasix. The patient was seen by Dr. Claiborne Billings who felt she was stable for DC home.  Follow up arranged.    The patient ambulated with cardiac rehab and sats were 87%.  She will be sent home with oxygen via [place] at 2L.  Will arrange OP PFTs May need pulmonary referral   Consults: None  Significant Diagnostic  Studies:  Left heart cath CONTRAST: Total of 75 cc.  COMPLICATIONS: None.  HEMODYNAMICS: Aortic pressure was 105/56; LV pressure was 108/15; LVEDP 22. There was no gradient between the left ventricle and aorta.  ANGIOGRAPHIC DATA: The left main coronary artery is widely patent.  The left anterior descending artery is a large vessel which wraps around the apex. In the proximal vessel, there is a 30%, eccentric stenosis. There is mild calcification. There are several small diagonals which are patent..  The left circumflex artery is a large vessel. There is a medium size ramus which is patent. There are mild irregularities in the circumflex. The first significant obtuse marginal has an ostial 75% stenosis. The next obtuse marginal is patent.  The right coronary artery is a medium size vessel. The posterior lateral artery is large and widely patent. The posterior descending artery a small but patent. There does not appear to be any significant atherosclerosis in the RCA system.  LEFT VENTRICULOGRAM: Left ventricular angiogram was done in the 30 RAO projection and revealed severely decreased left ventricular systolic function with an estimated ejection fraction of 25 %. There is severe hypokinesis of the mid to distal anterior wall. There is severe hypokinesis of the mid to distal inferior wall. The base contracts vigorously. There is some contraction at the apex. LVEDP was 22 mmHg.  IMPRESSIONS:  1. Normal left main coronary artery. 2. Mild disease in the proximal left anterior descending artery. 3. Patent left circumflex artery and its branches.. 75%, ostial stenosis of the OM1. The degree of LV dysfunction was well out of proportion to this stenosis. 4.  Widely patent right coronary artery. 5. Severely decreased left ventricular systolic function in a pattern of Takatsubo cardiomyopathy. LVEDP 22 mmHg. Ejection fraction 25 %.  RECOMMENDATION: Medical therapy for the left ventricular dysfunction. This  appears to be a stress-induced cardiomyopathy. The wall motion abnormalities cannot be explained by her coronary disease. I suspect that after a few weeks of medical therapy, her LV function will be back to normal. Of note, her only stressful event was exercising yesterday. She had been sick about a week ago with a GI illness. She does not recall any stressful life events that happened. Followup with Dr. Sallyanne Kuster.   Treatments: See above  Discharge Exam: Blood pressure 106/63, pulse 96, temperature 98.3 F (36.8 C), temperature source Oral, resp. rate 18, height 5\' 3"  (1.6 m), weight 198 lb 10.2 oz (90.1 kg), SpO2 95.00%.   Disposition: Final discharge disposition not confirmed      Discharge Orders   Future Appointments Provider Department Dept Phone   08/08/2013 11:30 AM Tarri Fuller, PA-C [facility] [phone]   Future Orders Complete By Expires   Diet - low sodium heart healthy  As directed    Discharge instructions  As directed    Increase activity slowly  As directed        Medication List    STOP taking these medications       olmesartan-hydrochlorothiazide 20-12.5 MG per tablet  Commonly known as:  BENICAR HCT      TAKE these medications       ALPHAGAN P OP  Apply to eye 2 (two) times daily.     ALPRAZolam 0.25 MG tablet  Commonly known as:  XANAX  Take 1 tablet (0.25 mg total) by mouth 2 (two) times daily as needed for anxiety.     atorvastatin 20 MG tablet  Commonly known as:  LIPITOR  Take 20 mg by mouth daily.     fexofenadine 180 MG tablet  Commonly known as:  ALLEGRA  Take 180 mg by mouth daily.     furosemide 20 MG tablet  Commonly known as:  LASIX  Take 1 tablet (20 mg total) by mouth daily as needed for fluid.     HYDROcodone-acetaminophen 5-325 MG per tablet  Commonly known as:  NORCO/VICODIN  Take 1 tablet by mouth daily.     ICAPS Tabs  Take 1 tablet by mouth daily.     metoprolol tartrate 25 MG tablet  Commonly known as:   LOPRESSOR  Take 1 tablet (25 mg total) by mouth 2 (two) times daily.     nitroGLYCERIN 0.4 MG SL tablet  Commonly known as:  NITROSTAT  Place 1 tablet (0.4 mg total) under the tongue once.     TYLENOL EXTRA STRENGTH PO  Take 2 tablets by mouth daily.       Follow-up Information   Follow up with Tarri Fuller, PA-C On 08/08/2013. (11:30AM)    Specialty:  Physician Assistant   Contact information:   [address] [place] [address] [phone]       Signed: Tarri Fuller, [facility] 07/27/2013, 8:31 AM

## 2013-07-27 NOTE — Progress Notes (Signed)
CARDIAC REHAB PHASE I   PRE:  Rate/Rhythm: 94 SR  BP:  Supine: 101/54  Sitting:   Standing:    SaO2: 89-90%RA  MODE:  Ambulation: 300 ft   POST:  Rate/Rhythm: 118 ST  BP:  Supine:   Sitting: 126/60  Standing:    SaO2: 87%RA, 95%2L  SATURATION QUALIFICATIONS: (This note is used to comply with regulatory documentation for home oxygen)  Patient Saturations on Room Air at Rest = 89%  Patient Saturations on Room Air while Ambulating = 87%  Patient Saturations on 2 Liters of oxygen while Ambulating = 95%  Please briefly explain why patient needs home oxygen: desat with walking  [phone] Pt walked on RA and then to 2L to keep sats up. C/o knees stiff. Walked 300 ft with asst x 1. No CP. Will need home oxygen as documented. Education completed with pt and husband. Reviewed CHF packet and zones. Discussed importance of daily weights and low sodium diet. Reviewed MI restrictions. Discussed CRP 2 and pt gave permission to refer to [place] program.    Graylon Good, RN BSN  07/27/2013 9:46 AM

## 2013-07-27 NOTE — Plan of Care (Signed)
Problem: Phase III Progression Outcomes Goal: ACE I or ARB if EF < 40% Outcome: Not Applicable Date Met:  04/47/15 Not tolerated patients blood pressure low with betablocker

## 2013-08-01 ENCOUNTER — Telehealth: Payer: Self-pay | Admitting: *Deleted

## 2013-08-01 NOTE — Telephone Encounter (Signed)
Cardiac Rehab Phase II order faxed to [facility].

## 2013-08-08 ENCOUNTER — Ambulatory Visit (INDEPENDENT_AMBULATORY_CARE_PROVIDER_SITE_OTHER): Payer: Medicare Other | Admitting: Physician Assistant

## 2013-08-08 ENCOUNTER — Encounter: Payer: Self-pay | Admitting: Physician Assistant

## 2013-08-08 VITALS — BP 140/70 | HR 70 | Ht 63.0 in | Wt 196.0 lb

## 2013-08-08 DIAGNOSIS — I5181 Takotsubo syndrome: Secondary | ICD-10-CM | POA: Diagnosis not present

## 2013-08-08 DIAGNOSIS — I10 Essential (primary) hypertension: Secondary | ICD-10-CM | POA: Diagnosis not present

## 2013-08-08 DIAGNOSIS — E785 Hyperlipidemia, unspecified: Secondary | ICD-10-CM

## 2013-08-08 DIAGNOSIS — I200 Unstable angina: Secondary | ICD-10-CM

## 2013-08-08 DIAGNOSIS — I249 Acute ischemic heart disease, unspecified: Secondary | ICD-10-CM

## 2013-08-08 MED ORDER — LISINOPRIL 2.5 MG PO TABS: 2.5000 mg | ORAL_TABLET | Freq: Every day | ORAL | Status: DC

## 2013-08-08 MED ORDER — METOPROLOL SUCCINATE ER 25 MG PO TB24: 25.0000 mg | ORAL_TABLET | Freq: Every day | ORAL | Status: DC

## 2013-08-08 NOTE — Assessment & Plan Note (Signed)
Blood pressures are just mildly elevated this morning I am adding an ACE inhibitor secondary to her cardiomyopathy.

## 2013-08-08 NOTE — Assessment & Plan Note (Addendum)
The patient's EF was 25% by LV gram.  Will need to recheck in a few months.  She has not been having any chest pain but has been more tired for the last 3 days. Apparently she's had grandchildren around lately as well.  Her weight has been stable.  Go to switch her from Toprol tartrate to succinate.  Her blood pressure has improved adequately so I think we can start an ACE inhibitor.  I refer to cardiac rehabilitation.  She'll continue low-sodium diet and daily weight management. She has Lasix 20 mg to use on a when necessary basis and will add potassium when she takes it.  We checked an ambulatory O2 saturation she was at 92% without supplementation. I think she can probably stop the extra O2 that she was discharged on.

## 2013-08-08 NOTE — Patient Instructions (Signed)
1.. Stop twice metoprolol tartrate and start metoprolol succinate 25mg  daily 2.  Start lisinopril 2.5 mg daily. 3.  Continue low sodium diet. 4.  Cardiac rehab referral. 5.  Follow up with Dr. Loletha Grayer in 1-2 months.

## 2013-08-08 NOTE — Assessment & Plan Note (Signed)
On Lipitor 

## 2013-08-08 NOTE — Progress Notes (Signed)
Date:  08/08/2013   ID:  Melinda [name], DOB 07-16-1934, MRN [id number]  PCP:  Osborne Casco, MD  Primary Cardiologist:  Croitor[facility]     History of Present Illness: Melinda [name] is a 79 y.o. female The patient is a 79 year old female with a history of hyperlipidemia, hypertension, gastroesophageal reflux disease, macular degeneration, bilateral breast cancer. The patient had a nuclear stress test 09/17/2012 was normal with an ejection fraction of 61%. She presents today with chest pain. Troponin is elevated. She reports developing Cp while at [employer] before 1100hrs today. The pain felt like a squeezing, band around her chest. It radiated up to her jaw and she was "clammy" with SOB, dizziness. She has an allergy to ASA. She tried taking an antacid without relief. The patient currently denies nausea, vomiting, fever, orthopnea, PND, congestion, abdominal pain, hematochezia, melena, lower extremity edema, claudication.   She was admitted. Troponin peaked at 2.44. She was started on lopressor 12.5mg  bid and it was increased to 25mg . She had a left heart cath which revealed 75% ostial stenosis in the OM1 and severely decreased left ventricular systolic function in a pattern of Takatsubo cardiomyopathy. LVEDP 22 mmHg. Ejection fraction 25%. Her BP will not tolerate an ACE or ARB at this time. We discussed daily weight monitoring.  The patient ambulated with cardiac rehab and sats were 87%. She was be sent home with oxygen via [place] at 2L. Will arrange OP PFTs.  May need pulmonary referral  Patient presents today for followup post hospital evaluation.  She has not been having any chest pain but has been more tired for the last 3 days. Apparently she's had grandchildren around lately as well it sounds as though she may have been more active than she needs to be.  Her weight has been stable. She is only taking Lasix once since being discharged. Her weight is actually 2 pounds less than  it was at discharge.  She has had intermittent cough which sounds like postnasal drip. She's been taking cough suppressant.    The patient currently denies nausea, vomiting, fever, chest pain, shortness of breath, orthopnea, dizziness, PND,  congestion, abdominal pain, hematochezia, melena, lower extremity edema, claudication.  Wt Readings from Last 3 Encounters:  08/08/13 196 lb (88.905 kg)  07/27/13 198 lb 10.2 oz (90.1 kg)  07/27/13 198 lb 10.2 oz (90.1 kg)     Past Medical History  Diagnosis Date  . Hyperlipidemia   . Allergy   . Hypertension   . Cancer     bilateral breast  . Osteopenia   . GERD (gastroesophageal reflux disease)   . Macular degeneration     right eye  . Myocardial infarction   . Anemia 1984    before hysterectomy  . History of blood product transfusion 1984  . Takotsubo cardiomyopathy     Current Outpatient Prescriptions  Medication Sig Dispense Refill  . Acetaminophen (TYLENOL EXTRA STRENGTH PO) Take 2 tablets by mouth daily.      Marland Kitchen ALPRAZolam (XANAX) 0.25 MG tablet Take 1 tablet (0.25 mg total) by mouth 2 (two) times daily as needed for anxiety.  30 tablet  0  . atorvastatin (LIPITOR) 20 MG tablet Take 20 mg by mouth daily.        . Brimonidine Tartrate (ALPHAGAN P OP) Apply to eye 2 (two) times daily.        . dorzolamide-timolol (COSOPT) 22.3-6.8 MG/ML ophthalmic solution       . fexofenadine (ALLEGRA) 180  MG tablet Take 180 mg by mouth daily.        . furosemide (LASIX) 20 MG tablet Take 1 tablet (20 mg total) by mouth daily as needed for fluid.  30 tablet  2  . HYDROcodone-acetaminophen (NORCO) 5-325 MG per tablet Take 1 tablet by mouth daily.       Marland Kitchen latanoprost (XALATAN) 0.005 % ophthalmic solution       . Multiple Vitamins-Minerals (ICAPS) TABS Take 1 tablet by mouth daily.      . nitroGLYCERIN (NITROSTAT) 0.4 MG SL tablet Place 1 tablet (0.4 mg total) under the tongue once.  25 tablet  12  . lisinopril (PRINIVIL,ZESTRIL) 2.5 MG tablet Take 1  tablet (2.5 mg total) by mouth daily.  30 tablet  3  . metoprolol succinate (TOPROL-XL) 25 MG 24 hr tablet Take 1 tablet (25 mg total) by mouth daily. Take with or immediately following a meal.  30 tablet  3   No current facility-administered medications for this visit.    Allergies:    Allergies  Allergen Reactions  . Nsaids Other (See Comments)    Extreme hives and difficulty breathing. "Has had to go to the ER."  . Salicylates Other (See Comments)    Extreme hives and difficulty breathing. "Has had to go to the ER."    Social History:  The patient  reports that she has quit smoking. She has never used smokeless tobacco. She reports that she drinks about 1.8 ounces of alcohol per week. She reports that she does not use illicit drugs.   Family history:   Family History  Problem Relation Age of Onset  . Dementia Mother   . Stroke Mother   . Heart disease Mother 41  . Cancer Father     Prostate  . Heart disease Father 75  . Cancer Maternal Aunt     Breast  . Cancer Maternal Grandmother   . Heart attack Maternal Grandfather   . Heart attack Paternal Grandfather     ROS:  Please see the history of present illness.  All other systems reviewed and negative.   PHYSICAL EXAM: VS:  BP 140/70  Pulse 70  Ht 5\' 3"  (1.6 m)  Wt 196 lb (88.905 kg)  BMI 34.73 kg/m2 obesity, well developed, in no acute distress HEENT: Pupils are equal round react to light accommodation extraocular movements are intact.  Neck: no JVDNo cervical lymphadenopathy. Cardiac: Regular rate and rhythm without murmurs rubs or gallops. Lungs:  clear to auscultation bilaterally, no wheezing, rhonchi or rales Abd: soft, nontender, positive bowel sounds all quadrants, Ext: no lower extremity edema.  2+ radial and dorsalis pedis pulses. Skin: warm and dry Neuro:  Grossly normal  EKG:  NSR 70.  Diffuse TWI.   ASSESSMENT AND PLAN:  Problem List Items Addressed This Visit   Essential hypertension     Blood  pressures are just mildly elevated this morning I am adding an ACE inhibitor secondary to her cardiomyopathy.    Relevant Medications      metoprolol succinate (TOPROL-XL) 24 hr tablet      lisinopril (PRINIVIL,ZESTRIL) tablet   Hyperlipidemia     On Lipitor    Relevant Medications      metoprolol succinate (TOPROL-XL) 24 hr tablet      lisinopril (PRINIVIL,ZESTRIL) tablet   Takotsubo cardiomyopathy     The patient's EF was 25% by LV gram.  Will need to recheck in a few months.  She has not been having any chest  pain but has been more tired for the last 3 days. Apparently she's had grandchildren around lately as well.  Her weight has been stable.  Go to switch her from Toprol tartrate to succinate.  Her blood pressure has improved adequately so I think we can start an ACE inhibitor.  I refer to cardiac rehabilitation.  She'll continue low-sodium diet and daily weight management. She has Lasix 20 mg to use on a when necessary basis and will add potassium when she takes it.  We checked an ambulatory O2 saturation she was at 92% without supplementation. I think she can probably stop the extra O2 that she was discharged on.      Relevant Medications      metoprolol succinate (TOPROL-XL) 24 hr tablet      lisinopril (PRINIVIL,ZESTRIL) tablet    Other Visit Diagnoses   Acute coronary syndrome    -  Primary    Relevant Medications       metoprolol succinate (TOPROL-XL) 24 hr tablet       lisinopril (PRINIVIL,ZESTRIL) tablet    Other Relevant Orders       EKG 12-Lead       AMB referral to cardiac rehabilitation

## 2013-08-09 ENCOUNTER — Telehealth: Payer: Self-pay | Admitting: Physician Assistant

## 2013-08-09 MED ORDER — LISINOPRIL 2.5 MG PO TABS: 2.5000 mg | ORAL_TABLET | Freq: Every day | ORAL | Status: DC

## 2013-08-09 MED ORDER — METOPROLOL SUCCINATE ER 25 MG PO TB24: 25.0000 mg | ORAL_TABLET | Freq: Every day | ORAL | Status: DC

## 2013-08-09 NOTE — Telephone Encounter (Signed)
There is no connection between takotsubo sd. and cognitive function that I am aware of.

## 2013-08-09 NOTE — Telephone Encounter (Signed)
Saw Melinda [name] yesterday for Metoprolol and Lisinopril. Pt wants them through [facility]  For 90 days supply. Also have a question he need to ask,but did not want to ask in front of his wife yesterday.

## 2013-08-09 NOTE — Telephone Encounter (Signed)
Patient's husband called and asked that toprol and lisinopril be sent to [facility]. Rx was sent to pharmacy electronically. Husband had question he was unable to ask Gaspar Bidding, PA at his wife's OV yesterday - he was wondering if there is any associated in Takotsubo cardiomyopathy and mental status changes. He states that he and his daughter have noticed mental/demeanor changes and that sometimes patient's actions are "almost childlike"   Will defer this question to Dr. Sallyanne Kuster (patient's primary cardiologist) and Gaspar Bidding, [place] Husband would like to be contacted regarding this, but is not wanting to have a conversation regarding this question/issue around his wife.

## 2013-08-10 NOTE — Telephone Encounter (Signed)
Called patient's husband with this information.

## 2013-08-12 ENCOUNTER — Telehealth: Payer: Self-pay | Admitting: Physician Assistant

## 2013-08-12 NOTE — Telephone Encounter (Signed)
Mr. Matsumura states that Tarri Fuller, PA discontinued Mrs. Melinda [name]'s oxygen when she was here for her last visit.  Please fax an order to [place] stating that the patient's O2 was stopped.

## 2013-08-12 NOTE — Telephone Encounter (Signed)
Need order to discontinue o2

## 2013-08-15 ENCOUNTER — Telehealth: Payer: Self-pay | Admitting: Physician Assistant

## 2013-08-15 ENCOUNTER — Telehealth: Payer: Self-pay | Admitting: Cardiology

## 2013-08-15 NOTE — Telephone Encounter (Signed)
Letter sent to [place] to D/C the home O2.

## 2013-08-15 NOTE — Telephone Encounter (Signed)
Wanted t let you know that she does get her O@ from [place].Marland Kitchen

## 2013-08-15 NOTE — Telephone Encounter (Signed)
He called [facility] they still have not received the order from here.

## 2013-08-15 NOTE — Telephone Encounter (Signed)
New problem   Returning your call concerning documentation. She stated she didn't received them for this patient. Please call.

## 2013-08-15 NOTE — Telephone Encounter (Signed)
Faxed to [facility]  

## 2013-08-15 NOTE — Telephone Encounter (Signed)
Info filled out

## 2013-08-15 NOTE — Telephone Encounter (Signed)
Please call-still did not get a call on Friday,waiting to hear something.

## 2013-08-15 NOTE — Telephone Encounter (Signed)
Message to [place] and Lavella Hammock.

## 2013-08-15 NOTE — Telephone Encounter (Signed)
Per Mrs. Albertsen her home O2 was set up by [facility].  [facility]ified [facility] and all they need is a letter stating the oxygen has been discontinue and needs to be taken out of the home.  Message sent to Tarri Fuller, [place].

## 2013-08-15 NOTE — Telephone Encounter (Signed)
Letter sent to [place] to D/C home O2.

## 2013-08-15 NOTE — Telephone Encounter (Signed)
TO Dr Radford Pax [facility] states they never received o2 request from our office.   This is in your to be signed folder

## 2013-08-18 ENCOUNTER — Encounter (HOSPITAL_COMMUNITY)
Admission: RE | Admit: 2013-08-18 | Discharge: 2013-08-18 | Disposition: A | Discharge status: Home / Self Care | Payer: Medicare Other | Source: Ambulatory Visit | Attending: Cardiovascular Disease | Admitting: Cardiovascular Disease

## 2013-08-18 DIAGNOSIS — I5181 Takotsubo syndrome: Secondary | ICD-10-CM | POA: Insufficient documentation

## 2013-08-18 DIAGNOSIS — I214 Non-ST elevation (NSTEMI) myocardial infarction: Secondary | ICD-10-CM | POA: Insufficient documentation

## 2013-08-18 DIAGNOSIS — E785 Hyperlipidemia, unspecified: Secondary | ICD-10-CM | POA: Insufficient documentation

## 2013-08-18 DIAGNOSIS — R079 Chest pain, unspecified: Secondary | ICD-10-CM | POA: Insufficient documentation

## 2013-08-18 DIAGNOSIS — I2510 Atherosclerotic heart disease of native coronary artery without angina pectoris: Secondary | ICD-10-CM | POA: Insufficient documentation

## 2013-08-18 DIAGNOSIS — I4891 Unspecified atrial fibrillation: Secondary | ICD-10-CM | POA: Insufficient documentation

## 2013-08-18 NOTE — Progress Notes (Signed)
Cardiac Rehab Medication Review by a Pharmacist  Does the patient  feel that his/her medications are working for him/her?  yes  Has the patient been experiencing any side effects to the medications prescribed?  no  Does the patient measure his/her own blood pressure or blood glucose at home?  no   Does the patient have any problems obtaining medications due to transportation or finances?   no  Understanding of regimen: good Understanding of indications: good Potential of compliance: good    Pharmacist comments: Patient is doing well overall and has a good grasp on her medications.  She states she does not always take her metoprolol with a meal.  We discussed the reasons that this medication should be taken with food.  She states she understands and will start taking it with at least a snack.  Otherwise, she seems to be doing well.    Thank you, Vivia Ewing, PharmD Clinical Pharmacist - Resident Pager: [phone] Pharmacy: [phone] 08/18/2013 9:29 AM

## 2013-08-24 ENCOUNTER — Encounter (HOSPITAL_COMMUNITY)
Admission: RE | Admit: 2013-08-24 | Discharge: 2013-08-24 | Disposition: A | Discharge status: Home / Self Care | Payer: Medicare Other | Source: Ambulatory Visit | Attending: Cardiovascular Disease | Admitting: Cardiovascular Disease

## 2013-08-24 DIAGNOSIS — Z5189 Encounter for other specified aftercare: Secondary | ICD-10-CM | POA: Diagnosis present

## 2013-08-24 DIAGNOSIS — R079 Chest pain, unspecified: Secondary | ICD-10-CM | POA: Diagnosis not present

## 2013-08-24 DIAGNOSIS — I214 Non-ST elevation (NSTEMI) myocardial infarction: Secondary | ICD-10-CM | POA: Diagnosis not present

## 2013-08-24 DIAGNOSIS — I4891 Unspecified atrial fibrillation: Secondary | ICD-10-CM | POA: Diagnosis not present

## 2013-08-24 DIAGNOSIS — I2510 Atherosclerotic heart disease of native coronary artery without angina pectoris: Secondary | ICD-10-CM | POA: Diagnosis not present

## 2013-08-24 DIAGNOSIS — I5181 Takotsubo syndrome: Secondary | ICD-10-CM | POA: Diagnosis not present

## 2013-08-24 DIAGNOSIS — E785 Hyperlipidemia, unspecified: Secondary | ICD-10-CM | POA: Diagnosis not present

## 2013-08-24 NOTE — Progress Notes (Signed)
Pt in today for her first day of exercise.  Pt tolerated light exercise but did note some fatigue toward the end of exercise.  Pt with elevated bp today pre exercise that pt attributed to sinus headache.Pt also drank 2 cups of coffee this morning prior to exercise.  Pt takes allegra for her allergies and reported having the ha start last evening.  Pt did take tylenol with some relief.  Pt able to continue with exercise bp did decrease some after exercise.  Will continue to monitor.  PHQ2 score 1.  Pt is hopeful that coming to exercise will improve her energy level which will help her mentally.  Short term goal is to improve balance.  Will make sure pt attends core strengthening classes to help her achieve this goal. Pt long term goal is to lose weight.  Will monitor pt progress toward achieving this goal with daily weights on exercise sessions.  Monitor shows Sr with inverted T wave . Cherre Huger, BSN

## 2013-08-26 ENCOUNTER — Telehealth: Payer: Self-pay | Admitting: Cardiology

## 2013-08-26 ENCOUNTER — Encounter (HOSPITAL_COMMUNITY)
Admission: RE | Admit: 2013-08-26 | Discharge: 2013-08-26 | Disposition: A | Discharge status: Home / Self Care | Payer: Medicare Other | Source: Ambulatory Visit | Attending: Cardiovascular Disease | Admitting: Cardiovascular Disease

## 2013-08-26 DIAGNOSIS — R079 Chest pain, unspecified: Secondary | ICD-10-CM | POA: Diagnosis not present

## 2013-08-26 NOTE — Telephone Encounter (Signed)
Spoke with [place] and she will refax forms over to be signed.  

## 2013-08-26 NOTE — Telephone Encounter (Signed)
Following up ° ° ° °Melinda [name] with [facility] called to find out the status of the billing paper work on this pt. May 8th they were fax to us.   Please give her a call with any question or fax [phone] °

## 2013-08-29 ENCOUNTER — Encounter (HOSPITAL_COMMUNITY)
Admission: RE | Admit: 2013-08-29 | Discharge: 2013-08-29 | Disposition: A | Discharge status: Home / Self Care | Payer: Medicare Other | Source: Ambulatory Visit | Attending: Cardiovascular Disease | Admitting: Cardiovascular Disease

## 2013-08-29 ENCOUNTER — Other Ambulatory Visit: Payer: Self-pay | Admitting: Cardiovascular Disease

## 2013-08-29 ENCOUNTER — Telehealth (HOSPITAL_COMMUNITY): Payer: Self-pay | Admitting: *Deleted

## 2013-08-29 DIAGNOSIS — I252 Old myocardial infarction: Secondary | ICD-10-CM | POA: Diagnosis not present

## 2013-08-29 DIAGNOSIS — Z5189 Encounter for other specified aftercare: Secondary | ICD-10-CM | POA: Insufficient documentation

## 2013-08-29 DIAGNOSIS — Z853 Personal history of malignant neoplasm of breast: Secondary | ICD-10-CM | POA: Diagnosis not present

## 2013-08-29 DIAGNOSIS — E785 Hyperlipidemia, unspecified: Secondary | ICD-10-CM | POA: Insufficient documentation

## 2013-08-29 DIAGNOSIS — I10 Essential (primary) hypertension: Secondary | ICD-10-CM | POA: Insufficient documentation

## 2013-08-29 MED ORDER — OLMESARTAN MEDOXOMIL-HCTZ 20-12.5 MG PO TABS: 1.0000 | ORAL_TABLET | Freq: Every day | ORAL | Status: DC

## 2013-08-29 NOTE — Telephone Encounter (Signed)
Please contact cardiologist office for medication change due to elevation in bp.  Contact phone number provided.

## 2013-08-29 NOTE — Progress Notes (Signed)
Elevation in BP noted resting and exertional. Seen in office by Aaron Edelman added Lisinopril. Pt notes that her BP have been elevated since discharged from hospital. Pt reports that prior to her cardiac event her bp was well controlled on Benicar and wonders should she start back on this. Rehab report faxed to office  For Dr. Sallyanne Kuster to review.   Cherre Huger, BSN

## 2013-08-29 NOTE — Telephone Encounter (Signed)
Message copied by Rowe Pavy on Mon Aug 29, 2013  4:54 PM ------      Message from: Sanda Klein      Created: Mon Aug 29, 2013  4:27 PM      Regarding: RE: elevation in bp       I agree with her.      Will stop lisinopril and restart Benicar HCT 20/12.5            Sanda Klein, MD, [facility]      [phone] office      [phone] pager            ----- Message -----         From: Rowe Pavy, RN         Sent: 08/29/2013   2:31 PM           To: Sanda Klein, MD, Tressa Busman, CMA      Subject: elevation in bp                                                Pt new to cardiac rehab started last week s/p NSTEMi 4/27.  Elevation in BP noted resting and exertional.  Seen in office by Aaron Edelman added Lisinopril.  Pt notes that her BP have been elevated since discharged from hospital.  Pt reports that prior to her cardiac event her bp was well controlled on Benicar and wonders should she start back on this. Rehab report faxed to office for your review.            Thanks       [employer] RN             ------

## 2013-08-31 ENCOUNTER — Telehealth (HOSPITAL_COMMUNITY): Payer: Self-pay | Admitting: *Deleted

## 2013-08-31 ENCOUNTER — Encounter (HOSPITAL_COMMUNITY): Payer: Medicare Other

## 2013-08-31 NOTE — Telephone Encounter (Signed)
Return call to clarify pt's medication change.  Pt is to switch Lisinopril to Benicar.  Script called into her preferred pharmacy.  Pt is to continue the Metoprolol XL as prescribed.  Pt thanked me for calling and clarifying what she was to do.  Pt informed rehab staff that she will be absent today due to attending a luncheon.  Plan to see pt on Friday.

## 2013-09-02 ENCOUNTER — Encounter (HOSPITAL_COMMUNITY)
Admission: RE | Admit: 2013-09-02 | Discharge: 2013-09-02 | Disposition: A | Discharge status: Home / Self Care | Payer: Medicare Other | Source: Ambulatory Visit | Attending: Cardiovascular Disease | Admitting: Cardiovascular Disease

## 2013-09-05 ENCOUNTER — Encounter (HOSPITAL_COMMUNITY)
Admission: RE | Admit: 2013-09-05 | Discharge: 2013-09-05 | Disposition: A | Discharge status: Home / Self Care | Payer: Medicare Other | Source: Ambulatory Visit | Attending: Cardiovascular Disease | Admitting: Cardiovascular Disease

## 2013-09-05 NOTE — Progress Notes (Signed)
Reviewed home exercise with pt today.  Pt plans to walk at home for exercise.  She will work her way up to 30 mins  Reviewed THR, pulse, RPE, sign and symptoms, NTG use, and when to call 911 or MD.  Pt voiced understanding. Alberteen Sam, MA, ACSM RCEP

## 2013-09-06 DIAGNOSIS — I10 Essential (primary) hypertension: Secondary | ICD-10-CM | POA: Diagnosis not present

## 2013-09-06 DIAGNOSIS — I2510 Atherosclerotic heart disease of native coronary artery without angina pectoris: Secondary | ICD-10-CM | POA: Diagnosis not present

## 2013-09-06 DIAGNOSIS — I428 Other cardiomyopathies: Secondary | ICD-10-CM | POA: Diagnosis not present

## 2013-09-06 DIAGNOSIS — E7800 Pure hypercholesterolemia, unspecified: Secondary | ICD-10-CM | POA: Diagnosis not present

## 2013-09-07 ENCOUNTER — Encounter (HOSPITAL_COMMUNITY)
Admission: RE | Admit: 2013-09-07 | Discharge: 2013-09-07 | Disposition: A | Discharge status: Home / Self Care | Payer: Medicare Other | Source: Ambulatory Visit | Attending: Cardiovascular Disease | Admitting: Cardiovascular Disease

## 2013-09-09 ENCOUNTER — Encounter (HOSPITAL_COMMUNITY): Payer: Medicare Other

## 2013-09-12 ENCOUNTER — Encounter (HOSPITAL_COMMUNITY)
Admission: RE | Admit: 2013-09-12 | Discharge: 2013-09-12 | Disposition: A | Discharge status: Home / Self Care | Payer: Medicare Other | Source: Ambulatory Visit | Attending: Cardiovascular Disease | Admitting: Cardiovascular Disease

## 2013-09-14 ENCOUNTER — Encounter (HOSPITAL_COMMUNITY)
Admission: RE | Admit: 2013-09-14 | Discharge: 2013-09-14 | Disposition: A | Discharge status: Home / Self Care | Payer: Medicare Other | Source: Ambulatory Visit | Attending: Cardiovascular Disease | Admitting: Cardiovascular Disease

## 2013-09-16 ENCOUNTER — Encounter (HOSPITAL_COMMUNITY)
Admission: RE | Admit: 2013-09-16 | Discharge: 2013-09-16 | Disposition: A | Discharge status: Home / Self Care | Payer: Medicare Other | Source: Ambulatory Visit | Attending: Cardiovascular Disease | Admitting: Cardiovascular Disease

## 2013-09-19 ENCOUNTER — Encounter (HOSPITAL_COMMUNITY)
Admission: RE | Admit: 2013-09-19 | Discharge: 2013-09-19 | Disposition: A | Discharge status: Home / Self Care | Payer: Medicare Other | Source: Ambulatory Visit | Attending: Cardiovascular Disease | Admitting: Cardiovascular Disease

## 2013-09-21 ENCOUNTER — Encounter (HOSPITAL_COMMUNITY)
Admission: RE | Admit: 2013-09-21 | Discharge: 2013-09-21 | Disposition: A | Discharge status: Home / Self Care | Payer: Medicare Other | Source: Ambulatory Visit | Attending: Cardiovascular Disease | Admitting: Cardiovascular Disease

## 2013-09-23 ENCOUNTER — Encounter (HOSPITAL_COMMUNITY)
Admission: RE | Admit: 2013-09-23 | Discharge: 2013-09-23 | Disposition: A | Discharge status: Home / Self Care | Payer: Medicare Other | Source: Ambulatory Visit | Attending: Cardiovascular Disease | Admitting: Cardiovascular Disease

## 2013-09-23 DIAGNOSIS — M1710 Unilateral primary osteoarthritis, unspecified knee: Secondary | ICD-10-CM | POA: Diagnosis not present

## 2013-09-23 DIAGNOSIS — M25569 Pain in unspecified knee: Secondary | ICD-10-CM | POA: Diagnosis not present

## 2013-09-26 ENCOUNTER — Encounter (HOSPITAL_COMMUNITY)
Admission: RE | Admit: 2013-09-26 | Discharge: 2013-09-26 | Disposition: A | Discharge status: Home / Self Care | Payer: Medicare Other | Source: Ambulatory Visit | Attending: Cardiovascular Disease | Admitting: Cardiovascular Disease

## 2013-09-28 ENCOUNTER — Encounter (HOSPITAL_COMMUNITY)
Admission: RE | Admit: 2013-09-28 | Discharge: 2013-09-28 | Disposition: A | Discharge status: Home / Self Care | Payer: Medicare Other | Source: Ambulatory Visit | Attending: Cardiovascular Disease | Admitting: Cardiovascular Disease

## 2013-09-28 DIAGNOSIS — E785 Hyperlipidemia, unspecified: Secondary | ICD-10-CM | POA: Insufficient documentation

## 2013-09-28 DIAGNOSIS — Z5189 Encounter for other specified aftercare: Secondary | ICD-10-CM | POA: Insufficient documentation

## 2013-09-28 DIAGNOSIS — I10 Essential (primary) hypertension: Secondary | ICD-10-CM | POA: Diagnosis not present

## 2013-09-28 DIAGNOSIS — Z853 Personal history of malignant neoplasm of breast: Secondary | ICD-10-CM | POA: Insufficient documentation

## 2013-09-28 DIAGNOSIS — I252 Old myocardial infarction: Secondary | ICD-10-CM | POA: Diagnosis not present

## 2013-10-03 ENCOUNTER — Encounter (HOSPITAL_COMMUNITY)
Admission: RE | Admit: 2013-10-03 | Discharge: 2013-10-03 | Disposition: A | Discharge status: Home / Self Care | Payer: Medicare Other | Source: Ambulatory Visit | Attending: Cardiovascular Disease | Admitting: Cardiovascular Disease

## 2013-10-03 DIAGNOSIS — Z5189 Encounter for other specified aftercare: Secondary | ICD-10-CM | POA: Diagnosis not present

## 2013-10-05 ENCOUNTER — Encounter (HOSPITAL_COMMUNITY)
Admission: RE | Admit: 2013-10-05 | Discharge: 2013-10-05 | Disposition: A | Discharge status: Home / Self Care | Payer: Medicare Other | Source: Ambulatory Visit | Attending: Cardiovascular Disease | Admitting: Cardiovascular Disease

## 2013-10-05 DIAGNOSIS — Z5189 Encounter for other specified aftercare: Secondary | ICD-10-CM | POA: Diagnosis not present

## 2013-10-07 ENCOUNTER — Encounter (HOSPITAL_COMMUNITY): Payer: Medicare Other

## 2013-10-10 ENCOUNTER — Encounter (HOSPITAL_COMMUNITY)
Admission: RE | Admit: 2013-10-10 | Discharge: 2013-10-10 | Disposition: A | Discharge status: Home / Self Care | Payer: Medicare Other | Source: Ambulatory Visit | Attending: Cardiovascular Disease | Admitting: Cardiovascular Disease

## 2013-10-10 DIAGNOSIS — H4011X0 Primary open-angle glaucoma, stage unspecified: Secondary | ICD-10-CM | POA: Diagnosis not present

## 2013-10-10 DIAGNOSIS — H409 Unspecified glaucoma: Secondary | ICD-10-CM | POA: Diagnosis not present

## 2013-10-10 DIAGNOSIS — H02409 Unspecified ptosis of unspecified eyelid: Secondary | ICD-10-CM | POA: Diagnosis not present

## 2013-10-10 DIAGNOSIS — Z5189 Encounter for other specified aftercare: Secondary | ICD-10-CM | POA: Diagnosis not present

## 2013-10-12 ENCOUNTER — Encounter (HOSPITAL_COMMUNITY)
Admission: RE | Admit: 2013-10-12 | Discharge: 2013-10-12 | Disposition: A | Discharge status: Home / Self Care | Payer: Medicare Other | Source: Ambulatory Visit | Attending: Cardiovascular Disease | Admitting: Cardiovascular Disease

## 2013-10-12 DIAGNOSIS — Z5189 Encounter for other specified aftercare: Secondary | ICD-10-CM | POA: Diagnosis not present

## 2013-10-14 ENCOUNTER — Encounter (HOSPITAL_COMMUNITY)
Admission: RE | Admit: 2013-10-14 | Discharge: 2013-10-14 | Disposition: A | Discharge status: Home / Self Care | Payer: Medicare Other | Source: Ambulatory Visit | Attending: Cardiovascular Disease | Admitting: Cardiovascular Disease

## 2013-10-14 DIAGNOSIS — Z5189 Encounter for other specified aftercare: Secondary | ICD-10-CM | POA: Diagnosis not present

## 2013-10-17 ENCOUNTER — Encounter (HOSPITAL_COMMUNITY)
Admission: RE | Admit: 2013-10-17 | Discharge: 2013-10-17 | Disposition: A | Discharge status: Home / Self Care | Payer: Medicare Other | Source: Ambulatory Visit | Attending: Cardiovascular Disease | Admitting: Cardiovascular Disease

## 2013-10-17 DIAGNOSIS — Z5189 Encounter for other specified aftercare: Secondary | ICD-10-CM | POA: Diagnosis not present

## 2013-10-19 ENCOUNTER — Encounter (HOSPITAL_COMMUNITY)
Admission: RE | Admit: 2013-10-19 | Discharge: 2013-10-19 | Disposition: A | Discharge status: Home / Self Care | Payer: Medicare Other | Source: Ambulatory Visit | Attending: Cardiovascular Disease | Admitting: Cardiovascular Disease

## 2013-10-19 DIAGNOSIS — Z5189 Encounter for other specified aftercare: Secondary | ICD-10-CM | POA: Diagnosis not present

## 2013-10-21 ENCOUNTER — Ambulatory Visit (INDEPENDENT_AMBULATORY_CARE_PROVIDER_SITE_OTHER): Payer: Medicare Other | Admitting: Cardiovascular Disease

## 2013-10-21 ENCOUNTER — Encounter: Payer: Self-pay | Admitting: Cardiovascular Disease

## 2013-10-21 ENCOUNTER — Encounter (HOSPITAL_COMMUNITY)
Admission: RE | Admit: 2013-10-21 | Discharge: 2013-10-21 | Disposition: A | Discharge status: Home / Self Care | Payer: Medicare Other | Source: Ambulatory Visit | Attending: Cardiovascular Disease | Admitting: Cardiovascular Disease

## 2013-10-21 VITALS — BP 124/68 | HR 79 | Resp 20 | Ht 63.0 in | Wt 191.0 lb

## 2013-10-21 DIAGNOSIS — I5181 Takotsubo syndrome: Secondary | ICD-10-CM

## 2013-10-21 DIAGNOSIS — I509 Heart failure, unspecified: Secondary | ICD-10-CM | POA: Diagnosis not present

## 2013-10-21 DIAGNOSIS — I200 Unstable angina: Secondary | ICD-10-CM

## 2013-10-21 DIAGNOSIS — Z5189 Encounter for other specified aftercare: Secondary | ICD-10-CM | POA: Diagnosis not present

## 2013-10-21 DIAGNOSIS — I2510 Atherosclerotic heart disease of native coronary artery without angina pectoris: Secondary | ICD-10-CM | POA: Insufficient documentation

## 2013-10-21 DIAGNOSIS — I10 Essential (primary) hypertension: Secondary | ICD-10-CM | POA: Diagnosis not present

## 2013-10-21 DIAGNOSIS — E785 Hyperlipidemia, unspecified: Secondary | ICD-10-CM | POA: Diagnosis not present

## 2013-10-21 NOTE — Patient Instructions (Signed)
Your physician has recommended you make the following change in your medication: decrease the metoprolol to 1/2 tablet daily for 1 week then STOP.  Your physician has requested that you have an echocardiogram. Echocardiography is a painless test that uses sound waves to create images of your heart. It provides your doctor with information about the size and shape of your heart and how well your heart's chambers and valves are working. This procedure takes approximately one hour. There are no restrictions for this procedure.   Your physician recommends that you schedule a follow-up appointment as needed.

## 2013-10-21 NOTE — Assessment & Plan Note (Signed)
Asymptomatic. Revascularization I indicated in the absence of angina. Excellent levels of total and LDL cholesterol and outstanding HDL cholesterol. She no longer smokes and has good blood pressure control.

## 2013-10-21 NOTE — Assessment & Plan Note (Signed)
From a clinical point of view she appears to have had complete resolution of stress cardiomyopathy. She has some residual exertional dyspnea, but I suspect this is related to her obesity and is a longer-term complaint. We'll check an echocardiogram to confirm normalization of left ventricular wall motion and ejection fraction. Wean off the metoprolol since she complains of some fatigue. Her diuretic is prescribed only "as needed".

## 2013-10-21 NOTE — Progress Notes (Signed)
Patient ID: Melinda [name], female   DOB: May 11, 1934, 79 y.o.   MRN: [id number]      Reason for office visit Followup stress cardiomyopathy  The patient is a 79 year old female with a history of hyperlipidemia, hypertension, gastroesophageal reflux disease, macular degeneration, bilateral breast cancer. The patient had a nuclear stress test 09/17/2012 was normal with an ejection fraction of 61%. In May of 2015 she presented with chest pain and elevated cardiac troponin peaking at 2.44. Left heart catheterization did show a 75% stenosis of the first oblique marginal artery, but the dominant abnormality with severely depressed left ventricular systolic function in a pattern consistent with Takotsubo syndrome. She had transient problems with acute systolic heart failure but improved quickly. She has not had further chest pain. 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 She has mild exertional dyspnea. She is obese.  Allergies  Allergen Reactions  . Nsaids Other (See Comments)    Extreme hives and difficulty breathing. "Has had to go to the ER."  . Salicylates Other (See Comments)    Extreme hives and difficulty breathing. "Has had to go to the ER."    Current Outpatient Prescriptions  Medication Sig Dispense Refill  . Acetaminophen (TYLENOL EXTRA STRENGTH PO) Take 2 tablets by mouth daily.      Marland Kitchen ALPRAZolam (XANAX) 0.25 MG tablet Take 0.25 mg by mouth 2 (two) times daily as needed for anxiety.      Marland Kitchen atorvastatin (LIPITOR) 20 MG tablet Take 20 mg by mouth at bedtime.       . dorzolamide-timolol (COSOPT) 22.3-6.8 MG/ML ophthalmic solution Place 1 drop into both eyes 2 (two) times daily.       . fexofenadine (ALLEGRA) 180 MG tablet Take 180 mg by mouth daily.       . furosemide (LASIX) 20 MG tablet Take 20 mg by mouth daily as needed for fluid.      Marland Kitchen HYDROcodone-acetaminophen (NORCO) 5-325 MG per tablet Take 1 tablet by mouth daily.       Marland Kitchen latanoprost  (XALATAN) 0.005 % ophthalmic solution Place 1 drop into both eyes at bedtime.       . metoprolol succinate (TOPROL-XL) 25 MG 24 hr tablet Take 25 mg by mouth daily. Take with or immediately following a meal.      . Multiple Vitamins-Minerals (ICAPS) TABS Take 1 tablet by mouth daily.      . nitroGLYCERIN (NITROSTAT) 0.4 MG SL tablet Place 0.4 mg under the tongue every 5 (five) minutes as needed for chest pain.      Marland Kitchen olmesartan-hydrochlorothiazide (BENICAR HCT) 20-12.5 MG per tablet Take 1 tablet by mouth daily.  90 tablet  3   No current facility-administered medications for this visit.    Past Medical History  Diagnosis Date  . Hyperlipidemia   . Allergy   . Hypertension   . Cancer     bilateral breast  . Osteopenia   . GERD (gastroesophageal reflux disease)   . Macular degeneration     right eye  . Myocardial infarction   . Anemia 1984    before hysterectomy  . History of blood product transfusion 1984  . Takotsubo cardiomyopathy     Past Surgical History  Procedure Laterality Date  . Breast lumpectomy  04/19/04    left with sentinel node  . Abdominal hysterectomy  1984  . Breast biopsy  04/29/05    right   . Cataract extraction      bilateral  .  Retinal laser procedure      right  . Appendectomy    . Eye surgery      Family History  Problem Relation Age of Onset  . Dementia Mother   . Stroke Mother   . Heart disease Mother 43  . Cancer Father     Prostate  . Heart disease Father 48  . Cancer Maternal Aunt     Breast  . Cancer Maternal Grandmother   . Heart attack Maternal Grandfather   . Heart attack Paternal Grandfather     History   Social History  . Marital Status: Married    Spouse Name: N/A    Number of Children: N/A  . Years of Education: N/A   Occupational History  . Not on file.   Social History Main Topics  . Smoking status: Former Research scientist (life sciences)  . Smokeless tobacco: Never Used  . Alcohol Use: 1.8 oz/week    3 Shots of liquor per week      Comment: 2-3 drinks containing a shot each of whiskey per night  . Drug Use: No  . Sexual Activity: Not on file   Other Topics Concern  . Not on file   Social History Narrative  . No narrative on file    Review of systems: As always, she is quite high strung and anxious. Mild exertional dyspnea similar to her chronic pattern. The patient specifically denies any chest pain at rest or with exertion, dyspnea at rest, orthopnea, paroxysmal nocturnal dyspnea, syncope, palpitations, focal neurological deficits, intermittent claudication, lower extremity edema, unexplained weight gain, cough, hemoptysis or wheezing.  The patient also denies abdominal pain, nausea, vomiting, dysphagia, diarrhea, constipation, polyuria, polydipsia, dysuria, hematuria, frequency, urgency, abnormal bleeding or bruising, fever, chills, unexpected weight changes, mood swings, change in skin or hair texture, change in voice quality, auditory or visual problems, allergic reactions or rashes, new musculoskeletal complaints other than usual "aches and pains".   PHYSICAL EXAM BP 124/68  Pulse 79  Resp 20  Ht 5\' 3"  (1.6 m)  Wt 191 lb (86.637 kg)  BMI 33.84 kg/m2  General: Alert, oriented x3, no distress, moderately obese Head: no evidence of trauma, PERRL, EOMI, no exophtalmos or lid lag, no myxedema, no xanthelasma; normal ears, nose and oropharynx Neck: normal jugular venous pulsations and no hepatojugular reflux; brisk carotid pulses without delay and no carotid bruits Chest: clear to auscultation, no signs of consolidation by percussion or palpation, normal fremitus, symmetrical and full respiratory excursions Cardiovascular: normal position and quality of the apical impulse, regular rhythm, normal first and second heart sounds, no murmurs, rubs or gallops Abdomen: no tenderness or distention, no masses by palpation, no abnormal pulsatility or arterial bruits, normal bowel sounds, no hepatosplenomegaly Extremities:  no clubbing, cyanosis or edema; 2+ radial, ulnar and brachial pulses bilaterally; 2+ right femoral, posterior tibial and dorsalis pedis pulses; 2+ left femoral, posterior tibial and dorsalis pedis pulses; no subclavian or femoral bruits Neurological: grossly nonfocal   Lipid Panel     Component Value Date/Time   CHOL 159 07/26/2013 0718   TRIG 86 07/26/2013 0718   HDL 89 07/26/2013 0718   CHOLHDL 1.8 07/26/2013 0718   VLDL 17 07/26/2013 0718   LDLCALC 53 07/26/2013 0718    BMET    Component Value Date/Time   NA 133* 07/26/2013 0718   K 3.6* 07/26/2013 0718   CL 94* 07/26/2013 0718   CO2 26 07/26/2013 0718   GLUCOSE 125* 07/26/2013 0718   BUN 7 07/26/2013 7782  CREATININE 0.52 07/26/2013 0718   CALCIUM 8.7 07/26/2013 0718   GFRNONAA >90 07/26/2013 0718   GFRAA >90 07/26/2013 0718     ASSESSMENT AND PLAN Takotsubo cardiomyopathy From a clinical point of view she appears to have had complete resolution of stress cardiomyopathy. She has some residual exertional dyspnea, but I suspect this is related to her obesity and is a longer-term complaint. We'll check an echocardiogram to confirm normalization of left ventricular wall motion and ejection fraction. Wean off the metoprolol since she complains of some fatigue. Her diuretic is prescribed only "as needed".  CAD (coronary artery disease) - 75% ostial OM1 Asymptomatic. Revascularization I indicated in the absence of angina. Excellent levels of total and LDL cholesterol and outstanding HDL cholesterol. She no longer smokes and has good blood pressure control.  Essential hypertension     Orders Placed This Encounter  Procedures  . 2D Echocardiogram without contrast   Meds ordered this encounter  Medications  . DISCONTD: metoprolol tartrate (LOPRESSOR) 25 MG tablet    Sig: Take 25 mg by mouth daily.    Holli Humbles, MD, [place] [phone] office [phone] pager

## 2013-10-24 ENCOUNTER — Encounter (HOSPITAL_COMMUNITY)
Admission: RE | Admit: 2013-10-24 | Discharge: 2013-10-24 | Disposition: A | Discharge status: Home / Self Care | Payer: Medicare Other | Source: Ambulatory Visit | Attending: Cardiovascular Disease | Admitting: Cardiovascular Disease

## 2013-10-24 DIAGNOSIS — Z5189 Encounter for other specified aftercare: Secondary | ICD-10-CM | POA: Diagnosis not present

## 2013-10-26 ENCOUNTER — Encounter (HOSPITAL_COMMUNITY)
Admission: RE | Admit: 2013-10-26 | Discharge: 2013-10-26 | Disposition: A | Discharge status: Home / Self Care | Payer: Medicare Other | Source: Ambulatory Visit | Attending: Cardiovascular Disease | Admitting: Cardiovascular Disease

## 2013-10-26 DIAGNOSIS — Z5189 Encounter for other specified aftercare: Secondary | ICD-10-CM | POA: Diagnosis not present

## 2013-10-26 NOTE — Progress Notes (Signed)
[name] 79 y.o. female Nutrition Note Spoke with pt.  Nutrition Survey reviewed with pt. Pt is following Step 2 of the Therapeutic Lifestyle Changes diet. Pt wants to lose wt. Pt reports she has lost 5 lb. Pt has been trying to lose wt by "exercising more and eating better." Wt loss tips reviewed. Pt expressed understanding of the information reviewed. Pt aware of nutrition education classes offered.  Nutrition Diagnosis   Food-and nutrition-related knowledge deficit related to lack of exposure to information as related to diagnosis of: ? CVD ? Pre-DM (A1c 5.7)    Obesity related to excessive energy intake as evidenced by a BMI of 35.3  Nutrition Intervention   Benefits of adopting Therapeutic Lifestyle Changes discussed when Medficts reviewed.   Pt to attend the Portion Distortion class   Pt to attend the  ? Nutrition I class - met 09/20/13                    ? Nutrition II class - met 09/27/13   Pt given handouts for: ? 5 day, 1500 kcal menu ideas   Continue client-centered nutrition education by RD, as part of interdisciplinary care.  Goal(s)   Pt to identify and limit food sources of saturated fat, trans fat, and cholesterol   Pt to identify food quantities necessary to achieve: ? wt loss to a goal wt of 168-186 lb (76.6-84.8 kg) at graduation from cardiac rehab.   Monitor and Evaluate progress toward nutrition goal with team. Nutrition Risk:  Low   Derek Mound, M.Ed, RD, LDN, CDE 10/26/2013 12:35 PM

## 2013-10-28 ENCOUNTER — Encounter (HOSPITAL_COMMUNITY)
Admission: RE | Admit: 2013-10-28 | Discharge: 2013-10-28 | Disposition: A | Discharge status: Home / Self Care | Payer: Medicare Other | Source: Ambulatory Visit | Attending: Cardiovascular Disease | Admitting: Cardiovascular Disease

## 2013-10-28 DIAGNOSIS — Z5189 Encounter for other specified aftercare: Secondary | ICD-10-CM | POA: Diagnosis not present

## 2013-10-31 ENCOUNTER — Ambulatory Visit (HOSPITAL_COMMUNITY): Payer: Medicare Other

## 2013-10-31 ENCOUNTER — Encounter (HOSPITAL_COMMUNITY)
Admission: RE | Admit: 2013-10-31 | Discharge: 2013-10-31 | Disposition: A | Discharge status: Home / Self Care | Payer: Medicare Other | Source: Ambulatory Visit | Attending: Cardiovascular Disease | Admitting: Cardiovascular Disease

## 2013-10-31 DIAGNOSIS — I252 Old myocardial infarction: Secondary | ICD-10-CM | POA: Insufficient documentation

## 2013-10-31 DIAGNOSIS — Z853 Personal history of malignant neoplasm of breast: Secondary | ICD-10-CM | POA: Diagnosis not present

## 2013-10-31 DIAGNOSIS — I10 Essential (primary) hypertension: Secondary | ICD-10-CM | POA: Diagnosis not present

## 2013-10-31 DIAGNOSIS — Z5189 Encounter for other specified aftercare: Secondary | ICD-10-CM | POA: Diagnosis not present

## 2013-10-31 DIAGNOSIS — E785 Hyperlipidemia, unspecified: Secondary | ICD-10-CM | POA: Diagnosis not present

## 2013-11-02 ENCOUNTER — Encounter (HOSPITAL_COMMUNITY)
Admission: RE | Admit: 2013-11-02 | Discharge: 2013-11-02 | Disposition: A | Discharge status: Home / Self Care | Payer: Medicare Other | Source: Ambulatory Visit | Attending: Cardiovascular Disease | Admitting: Cardiovascular Disease

## 2013-11-02 DIAGNOSIS — Z5189 Encounter for other specified aftercare: Secondary | ICD-10-CM | POA: Diagnosis not present

## 2013-11-04 ENCOUNTER — Encounter (HOSPITAL_COMMUNITY)
Admission: RE | Admit: 2013-11-04 | Discharge: 2013-11-04 | Disposition: A | Discharge status: Home / Self Care | Payer: Medicare Other | Source: Ambulatory Visit | Attending: Cardiovascular Disease | Admitting: Cardiovascular Disease

## 2013-11-04 ENCOUNTER — Ambulatory Visit (HOSPITAL_COMMUNITY)
Admission: RE | Admit: 2013-11-04 | Discharge: 2013-11-04 | Disposition: A | Discharge status: Home / Self Care | Payer: Medicare Other | Source: Ambulatory Visit | Attending: Cardiology | Admitting: Cardiology

## 2013-11-04 DIAGNOSIS — I519 Heart disease, unspecified: Secondary | ICD-10-CM | POA: Diagnosis not present

## 2013-11-04 DIAGNOSIS — I509 Heart failure, unspecified: Secondary | ICD-10-CM | POA: Insufficient documentation

## 2013-11-04 DIAGNOSIS — I5181 Takotsubo syndrome: Secondary | ICD-10-CM

## 2013-11-04 DIAGNOSIS — Z5189 Encounter for other specified aftercare: Secondary | ICD-10-CM | POA: Diagnosis not present

## 2013-11-04 NOTE — Progress Notes (Signed)
2D Limited Echocardiogram Complete to evaluate for Left Ventricular Function and Wall Motion Abnormalities.  11/04/2013   Rekha Hobbins, RDCS

## 2013-11-07 ENCOUNTER — Encounter (HOSPITAL_COMMUNITY)
Admission: RE | Admit: 2013-11-07 | Discharge: 2013-11-07 | Disposition: A | Discharge status: Home / Self Care | Payer: Medicare Other | Source: Ambulatory Visit | Attending: Cardiovascular Disease | Admitting: Cardiovascular Disease

## 2013-11-07 DIAGNOSIS — Z5189 Encounter for other specified aftercare: Secondary | ICD-10-CM | POA: Diagnosis not present

## 2013-11-09 ENCOUNTER — Encounter (HOSPITAL_COMMUNITY)
Admission: RE | Admit: 2013-11-09 | Discharge: 2013-11-09 | Disposition: A | Discharge status: Home / Self Care | Payer: Medicare Other | Source: Ambulatory Visit | Attending: Cardiovascular Disease | Admitting: Cardiovascular Disease

## 2013-11-09 ENCOUNTER — Encounter (HOSPITAL_COMMUNITY): Payer: Medicare Other

## 2013-11-09 DIAGNOSIS — Z5189 Encounter for other specified aftercare: Secondary | ICD-10-CM | POA: Diagnosis not present

## 2013-11-11 ENCOUNTER — Encounter (HOSPITAL_COMMUNITY)
Admission: RE | Admit: 2013-11-11 | Discharge: 2013-11-11 | Disposition: A | Discharge status: Home / Self Care | Payer: Medicare Other | Source: Ambulatory Visit | Attending: Cardiovascular Disease | Admitting: Cardiovascular Disease

## 2013-11-11 DIAGNOSIS — Z5189 Encounter for other specified aftercare: Secondary | ICD-10-CM | POA: Diagnosis not present

## 2013-11-14 ENCOUNTER — Encounter (HOSPITAL_COMMUNITY)
Admission: RE | Admit: 2013-11-14 | Discharge: 2013-11-14 | Disposition: A | Discharge status: Home / Self Care | Payer: Medicare Other | Source: Ambulatory Visit | Attending: Cardiovascular Disease | Admitting: Cardiovascular Disease

## 2013-11-14 DIAGNOSIS — Z5189 Encounter for other specified aftercare: Secondary | ICD-10-CM | POA: Diagnosis not present

## 2013-11-16 ENCOUNTER — Encounter (HOSPITAL_COMMUNITY)
Admission: RE | Admit: 2013-11-16 | Discharge: 2013-11-16 | Disposition: A | Discharge status: Home / Self Care | Payer: Medicare Other | Source: Ambulatory Visit | Attending: Cardiovascular Disease | Admitting: Cardiovascular Disease

## 2013-11-16 DIAGNOSIS — Z5189 Encounter for other specified aftercare: Secondary | ICD-10-CM | POA: Diagnosis not present

## 2013-11-18 ENCOUNTER — Encounter (HOSPITAL_COMMUNITY)
Admission: RE | Admit: 2013-11-18 | Discharge: 2013-11-18 | Disposition: A | Discharge status: Home / Self Care | Payer: Medicare Other | Source: Ambulatory Visit | Attending: Cardiovascular Disease | Admitting: Cardiovascular Disease

## 2013-11-18 DIAGNOSIS — Z5189 Encounter for other specified aftercare: Secondary | ICD-10-CM | POA: Diagnosis not present

## 2013-11-21 ENCOUNTER — Encounter (HOSPITAL_COMMUNITY)
Admission: RE | Admit: 2013-11-21 | Discharge: 2013-11-21 | Disposition: A | Discharge status: Home / Self Care | Payer: Medicare Other | Source: Ambulatory Visit | Attending: Cardiovascular Disease | Admitting: Cardiovascular Disease

## 2013-11-21 DIAGNOSIS — Z5189 Encounter for other specified aftercare: Secondary | ICD-10-CM | POA: Diagnosis not present

## 2013-11-23 ENCOUNTER — Encounter (HOSPITAL_COMMUNITY)
Admission: RE | Admit: 2013-11-23 | Discharge: 2013-11-23 | Disposition: A | Discharge status: Home / Self Care | Payer: Medicare Other | Source: Ambulatory Visit | Attending: Cardiovascular Disease | Admitting: Cardiovascular Disease

## 2013-11-23 DIAGNOSIS — Z5189 Encounter for other specified aftercare: Secondary | ICD-10-CM | POA: Diagnosis not present

## 2013-11-23 NOTE — Progress Notes (Signed)
Pt graduates today from the cardiac rehab Phase II exercise program with the completion of 36 sessions. Pt made good progress both physical and socially.  Pt plans to continue home exercise during the interim before she starts the cardiac rehab maintenance program in October.  Continued exercise in a group setting will be very beneficial for pt.  Pt admits to some depression and feels she may benefit from antidepressant therapy along with counseling support.  Pt admits she has some weepy days and uses xanax during "sressful times"  Pt plans to talk with her primary physican more about this during her next check up. Medication list reconciled. Repeat PHQ2 1.  Pt feels she met her goals and is pleased with the progress she has made and is excited to return to the maintenance program after her trip to [place].  It was indeed a great pleasure to have this pleasant pt in our cardiac rehab program.  Maurice Small RN, BSN

## 2013-11-24 ENCOUNTER — Telehealth: Payer: Self-pay | Admitting: *Deleted

## 2013-11-24 NOTE — Telephone Encounter (Signed)
Signed order for cardiac rehab maintenance program faxed to [facility].

## 2013-11-25 ENCOUNTER — Encounter (HOSPITAL_COMMUNITY): Payer: Medicare Other

## 2013-11-28 ENCOUNTER — Emergency Department (HOSPITAL_COMMUNITY): Payer: Medicare Other

## 2013-11-28 ENCOUNTER — Encounter (HOSPITAL_COMMUNITY): Payer: Medicare Other

## 2013-11-28 ENCOUNTER — Encounter (HOSPITAL_COMMUNITY): Payer: Self-pay | Admitting: Emergency Medicine

## 2013-11-28 ENCOUNTER — Emergency Department (HOSPITAL_COMMUNITY)
Admission: EM | Admit: 2013-11-28 | Discharge: 2013-11-28 | Disposition: A | Discharge status: Home / Self Care | Payer: Medicare Other | Attending: Emergency Medicine | Admitting: Emergency Medicine

## 2013-11-28 DIAGNOSIS — Z79899 Other long term (current) drug therapy: Secondary | ICD-10-CM | POA: Insufficient documentation

## 2013-11-28 DIAGNOSIS — Z8739 Personal history of other diseases of the musculoskeletal system and connective tissue: Secondary | ICD-10-CM | POA: Insufficient documentation

## 2013-11-28 DIAGNOSIS — S0180XA Unspecified open wound of other part of head, initial encounter: Secondary | ICD-10-CM | POA: Insufficient documentation

## 2013-11-28 DIAGNOSIS — Y9389 Activity, other specified: Secondary | ICD-10-CM | POA: Diagnosis not present

## 2013-11-28 DIAGNOSIS — Z853 Personal history of malignant neoplasm of breast: Secondary | ICD-10-CM | POA: Insufficient documentation

## 2013-11-28 DIAGNOSIS — Z8719 Personal history of other diseases of the digestive system: Secondary | ICD-10-CM | POA: Insufficient documentation

## 2013-11-28 DIAGNOSIS — S0990XA Unspecified injury of head, initial encounter: Secondary | ICD-10-CM | POA: Diagnosis not present

## 2013-11-28 DIAGNOSIS — S0181XA Laceration without foreign body of other part of head, initial encounter: Secondary | ICD-10-CM

## 2013-11-28 DIAGNOSIS — I10 Essential (primary) hypertension: Secondary | ICD-10-CM | POA: Diagnosis not present

## 2013-11-28 DIAGNOSIS — Z87891 Personal history of nicotine dependence: Secondary | ICD-10-CM | POA: Diagnosis not present

## 2013-11-28 DIAGNOSIS — Y9229 Other specified public building as the place of occurrence of the external cause: Secondary | ICD-10-CM | POA: Diagnosis not present

## 2013-11-28 DIAGNOSIS — D649 Anemia, unspecified: Secondary | ICD-10-CM | POA: Diagnosis not present

## 2013-11-28 DIAGNOSIS — Z8669 Personal history of other diseases of the nervous system and sense organs: Secondary | ICD-10-CM | POA: Diagnosis not present

## 2013-11-28 DIAGNOSIS — IMO0002 Reserved for concepts with insufficient information to code with codable children: Secondary | ICD-10-CM | POA: Diagnosis not present

## 2013-11-28 DIAGNOSIS — I252 Old myocardial infarction: Secondary | ICD-10-CM | POA: Insufficient documentation

## 2013-11-28 DIAGNOSIS — E785 Hyperlipidemia, unspecified: Secondary | ICD-10-CM | POA: Insufficient documentation

## 2013-11-28 DIAGNOSIS — T1490XA Injury, unspecified, initial encounter: Secondary | ICD-10-CM | POA: Diagnosis not present

## 2013-11-28 DIAGNOSIS — R55 Syncope and collapse: Secondary | ICD-10-CM | POA: Diagnosis not present

## 2013-11-28 MED ORDER — ACETAMINOPHEN 325 MG PO TABS
650.0000 mg | ORAL_TABLET | Freq: Once | ORAL | Status: AC
  Administered 2013-11-28: 650 mg via ORAL
  Filled 2013-11-28: qty 2
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
## 2013-11-28 NOTE — Discharge Instructions (Signed)
If you were given medicines take as directed.  If you are on coumadin or contraceptives realize their levels and effectiveness is altered by many different medicines.  If you have any reaction (rash, tongues swelling, other) to the medicines stop taking and see a physician.   Please follow up as directed and return to the ER or see a physician for new or worsening symptoms.  Thank you. Filed Vitals:   11/28/13 2029  BP: 144/75  Pulse: 78  Temp: 98.1 F (36.7 C)  TempSrc: Oral  Resp: 23  SpO2: 98%

## 2013-11-28 NOTE — ED Notes (Signed)
Urine sample at bedside

## 2013-11-28 NOTE — ED Notes (Signed)
Patient transported to CT 

## 2013-11-28 NOTE — ED Notes (Signed)
Pt ambulated to bathroom with no assistance.  
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
## 2013-12-07 DIAGNOSIS — Z23 Encounter for immunization: Secondary | ICD-10-CM | POA: Diagnosis not present

## 2013-12-07 DIAGNOSIS — T148XXA Other injury of unspecified body region, initial encounter: Secondary | ICD-10-CM | POA: Diagnosis not present

## 2013-12-07 DIAGNOSIS — F411 Generalized anxiety disorder: Secondary | ICD-10-CM | POA: Diagnosis not present

## 2013-12-08 ENCOUNTER — Encounter (HOSPITAL_COMMUNITY): Payer: Self-pay | Admitting: Emergency Medicine

## 2013-12-08 ENCOUNTER — Inpatient Hospital Stay (HOSPITAL_COMMUNITY)
Admission: EM | Admit: 2013-12-08 | Discharge: 2013-12-08 | DRG: 309 | Disposition: A | Discharge status: Home / Self Care | Payer: Medicare Other | Attending: Cardiovascular Disease | Admitting: Cardiovascular Disease

## 2013-12-08 ENCOUNTER — Emergency Department (HOSPITAL_COMMUNITY): Payer: Medicare Other

## 2013-12-08 ENCOUNTER — Telehealth: Payer: Self-pay | Admitting: Cardiovascular Disease

## 2013-12-08 ENCOUNTER — Other Ambulatory Visit: Payer: Self-pay | Admitting: Physician Assistant

## 2013-12-08 DIAGNOSIS — E7800 Pure hypercholesterolemia, unspecified: Secondary | ICD-10-CM | POA: Diagnosis present

## 2013-12-08 DIAGNOSIS — I499 Cardiac arrhythmia, unspecified: Secondary | ICD-10-CM | POA: Diagnosis not present

## 2013-12-08 DIAGNOSIS — I2510 Atherosclerotic heart disease of native coronary artery without angina pectoris: Secondary | ICD-10-CM

## 2013-12-08 DIAGNOSIS — E876 Hypokalemia: Secondary | ICD-10-CM | POA: Diagnosis present

## 2013-12-08 DIAGNOSIS — Z9849 Cataract extraction status, unspecified eye: Secondary | ICD-10-CM | POA: Diagnosis not present

## 2013-12-08 DIAGNOSIS — R6884 Jaw pain: Secondary | ICD-10-CM | POA: Diagnosis not present

## 2013-12-08 DIAGNOSIS — I10 Essential (primary) hypertension: Secondary | ICD-10-CM

## 2013-12-08 DIAGNOSIS — Z853 Personal history of malignant neoplasm of breast: Secondary | ICD-10-CM

## 2013-12-08 DIAGNOSIS — I5181 Takotsubo syndrome: Secondary | ICD-10-CM | POA: Diagnosis present

## 2013-12-08 DIAGNOSIS — Z87891 Personal history of nicotine dependence: Secondary | ICD-10-CM | POA: Diagnosis not present

## 2013-12-08 DIAGNOSIS — Z886 Allergy status to analgesic agent status: Secondary | ICD-10-CM | POA: Diagnosis not present

## 2013-12-08 DIAGNOSIS — H3530 Unspecified macular degeneration: Secondary | ICD-10-CM | POA: Diagnosis present

## 2013-12-08 DIAGNOSIS — F1010 Alcohol abuse, uncomplicated: Secondary | ICD-10-CM | POA: Diagnosis present

## 2013-12-08 DIAGNOSIS — E785 Hyperlipidemia, unspecified: Secondary | ICD-10-CM

## 2013-12-08 DIAGNOSIS — I252 Old myocardial infarction: Secondary | ICD-10-CM | POA: Diagnosis not present

## 2013-12-08 DIAGNOSIS — Z8249 Family history of ischemic heart disease and other diseases of the circulatory system: Secondary | ICD-10-CM | POA: Diagnosis not present

## 2013-12-08 DIAGNOSIS — R0602 Shortness of breath: Secondary | ICD-10-CM | POA: Diagnosis not present

## 2013-12-08 DIAGNOSIS — Z90710 Acquired absence of both cervix and uterus: Secondary | ICD-10-CM

## 2013-12-08 DIAGNOSIS — I4891 Unspecified atrial fibrillation: Secondary | ICD-10-CM | POA: Diagnosis not present

## 2013-12-08 DIAGNOSIS — I480 Paroxysmal atrial fibrillation: Secondary | ICD-10-CM

## 2013-12-08 LAB — CBC WITH DIFFERENTIAL/PLATELET
BASOS ABS: 0 10*3/uL (ref 0.0–0.1)
Basophils Relative: 0 % (ref 0–1)
Eosinophils Absolute: 0.1 10*3/uL (ref 0.0–0.7)
Eosinophils Relative: 2 % (ref 0–5)
HCT: 41.3 % (ref 36.0–46.0)
HEMOGLOBIN: 14.3 g/dL (ref 12.0–15.0)
LYMPHS ABS: 1.9 10*3/uL (ref 0.7–4.0)
Lymphocytes Relative: 26 % (ref 12–46)
MCH: 32.9 pg (ref 26.0–34.0)
MCHC: 34.6 g/dL (ref 30.0–36.0)
MCV: 94.9 fL (ref 78.0–100.0)
MONO ABS: 0.6 10*3/uL (ref 0.1–1.0)
MONOS PCT: 9 % (ref 3–12)
NEUTROS ABS: 4.6 10*3/uL (ref 1.7–7.7)
Neutrophils Relative %: 63 % (ref 43–77)
Platelets: 286 10*3/uL (ref 150–400)
RBC: 4.35 MIL/uL (ref 3.87–5.11)
RDW: 13.3 % (ref 11.5–15.5)
WBC: 7.3 10*3/uL (ref 4.0–10.5)

## 2013-12-08 LAB — BASIC METABOLIC PANEL
Anion gap: 16 — ABNORMAL HIGH (ref 5–15)
BUN: 11 mg/dL (ref 6–23)
CALCIUM: 9.4 mg/dL (ref 8.4–10.5)
CHLORIDE: 91 meq/L — AB (ref 96–112)
CO2: 25 meq/L (ref 19–32)
Creatinine, Ser: 0.47 mg/dL — ABNORMAL LOW (ref 0.50–1.10)
GFR calc Af Amer: >90 mL/min (ref >90)
GFR calc non Af Amer: >90 mL/min (ref >90)
GLUCOSE: 99 mg/dL (ref 70–99)
Potassium: 3.5 mEq/L — ABNORMAL LOW (ref 3.7–5.3)
Sodium: 132 mEq/L — ABNORMAL LOW (ref 137–147)

## 2013-12-08 LAB — I-STAT TROPONIN, ED: Troponin i, poc: 0.01 ng/mL (ref 0.00–0.08)

## 2013-12-08 LAB — MRSA PCR SCREENING: MRSA by PCR: NEGATIVE

## 2013-12-08 LAB — TSH: TSH: 4.04 u[IU]/mL (ref 0.350–4.500)

## 2013-12-08 MED ORDER — APIXABAN 5 MG PO TABS: 5.0000 mg | ORAL_TABLET | Freq: Two times a day (BID) | ORAL | Status: DC

## 2013-12-08 MED ORDER — METOPROLOL SUCCINATE ER 25 MG PO TB24
25.0000 mg | ORAL_TABLET | Freq: Every day | ORAL | Status: DC
  Administered 2013-12-08: 25 mg via ORAL
  Filled 2013-12-08: qty 1

## 2013-12-08 MED ORDER — POTASSIUM CHLORIDE CRYS ER 20 MEQ PO TBCR
40.0000 meq | EXTENDED_RELEASE_TABLET | Freq: Once | ORAL | Status: AC
  Administered 2013-12-08: 40 meq via ORAL
  Filled 2013-12-08: qty 2

## 2013-12-08 MED ORDER — DILTIAZEM LOAD VIA INFUSION
20.0000 mg | Freq: Once | INTRAVENOUS | Status: AC
  Administered 2013-12-08: 20 mg via INTRAVENOUS
  Filled 2013-12-08: qty 20

## 2013-12-08 MED ORDER — POTASSIUM CHLORIDE CRYS ER 20 MEQ PO TBCR: 40.0000 meq | EXTENDED_RELEASE_TABLET | Freq: Once | ORAL | Status: DC

## 2013-12-08 MED ORDER — METOPROLOL SUCCINATE ER 25 MG PO TB24: 25.0000 mg | ORAL_TABLET | Freq: Every day | ORAL | Status: DC

## 2013-12-08 MED ORDER — DILTIAZEM HCL 100 MG IV SOLR
5.0000 mg/h | INTRAVENOUS | Status: DC
  Administered 2013-12-08: 20 mg/h via INTRAVENOUS

## 2013-12-08 MED ORDER — HYDROCHLOROTHIAZIDE 12.5 MG PO CAPS
12.5000 mg | ORAL_CAPSULE | Freq: Every day | ORAL | Status: DC
  Administered 2013-12-08: 12.5 mg via ORAL
  Filled 2013-12-08: qty 1

## 2013-12-08 MED ORDER — ACETAMINOPHEN 500 MG PO TABS: 1000.0000 mg | ORAL_TABLET | Freq: Every evening | ORAL | Status: DC | PRN

## 2013-12-08 MED ORDER — DORZOLAMIDE HCL-TIMOLOL MAL 2-0.5 % OP SOLN
1.0000 [drp] | Freq: Two times a day (BID) | OPHTHALMIC | Status: DC
  Filled 2013-12-08: qty 10

## 2013-12-08 MED ORDER — METOPROLOL TARTRATE 25 MG PO TABS
25.0000 mg | ORAL_TABLET | Freq: Four times a day (QID) | ORAL | Status: DC
  Administered 2013-12-08: 25 mg via ORAL
  Filled 2013-12-08 (×2): qty 1

## 2013-12-08 MED ORDER — APIXABAN 5 MG PO TABS
5.0000 mg | ORAL_TABLET | Freq: Two times a day (BID) | ORAL | Status: DC
  Administered 2013-12-08: 5 mg via ORAL
  Filled 2013-12-08 (×2): qty 1

## 2013-12-08 MED ORDER — LORATADINE 10 MG PO TABS
10.0000 mg | ORAL_TABLET | Freq: Every day | ORAL | Status: DC
  Filled 2013-12-08: qty 1

## 2013-12-08 MED ORDER — HYDROCODONE-ACETAMINOPHEN 5-325 MG PO TABS
1.0000 | ORAL_TABLET | Freq: Every day | ORAL | Status: DC
Start: 2013-12-08 — End: 2013-12-08

## 2013-12-08 MED ORDER — OLMESARTAN MEDOXOMIL-HCTZ 20-12.5 MG PO TABS: 1.0000 | ORAL_TABLET | Freq: Every day | ORAL | Status: DC

## 2013-12-08 MED ORDER — ATORVASTATIN CALCIUM 20 MG PO TABS
20.0000 mg | ORAL_TABLET | Freq: Every day | ORAL | Status: DC
  Filled 2013-12-08: qty 1

## 2013-12-08 MED ORDER — IRBESARTAN 150 MG PO TABS
150.0000 mg | ORAL_TABLET | Freq: Every day | ORAL | Status: DC
  Administered 2013-12-08: 150 mg via ORAL
  Filled 2013-12-08: qty 1

## 2013-12-08 MED ORDER — LATANOPROST 0.005 % OP SOLN
1.0000 [drp] | Freq: Every day | OPHTHALMIC | Status: DC
  Filled 2013-12-08: qty 2.5

## 2013-12-08 NOTE — ED Notes (Signed)
MD at bedside. 

## 2013-12-08 NOTE — ED Notes (Signed)
Per EMS pt woke up from her sleep this morning and felt her heart racing, pt reports she called EMS. Pt denies hx of afib, EMS reports the pt's HR was initially 180, pt received a total of 20 mg of cardizem from EMS. Pt A&O X4.HR 150 upon arrival to department. EDP at [facility] upon arrival.

## 2013-12-08 NOTE — Discharge Summary (Signed)
Discharge Summary   Patient ID: Melinda [name] MRN: [id number], DOB/AGE: [birth year] 77 y.o. Admit date: 12/08/2013 D/C date:     12/08/2013  Primary Cardiologist: Dr. Sallyanne Kuster  Principal Problem:   Atrial fibrillation with RVR Active Problems:   Essential hypertension   Hyperlipidemia   Takotsubo cardiomyopathy   CAD (coronary artery disease) - 75% ostial OM1   Paroxysmal a-fib   Admission Dates: 12/08/13- 12/08/13 Discharge Diagnosis: Afib with RVR- spontaneously converted into NSR  HPI: Melinda [name] is a pleasant 79 her old female with history of stress-induced cardiomyopathy (30%--> 60%), hypertension, and dyslipidemia who presented to the [facility] ED on 12/08/13 with a 4 hour history of sudden onset palpitations, jaw pain and mild diaphoresis and was found to be in atrial fibrillation with RVR.   Patient states that she was in her usual state of health until 12:40 AM when she woke up with sudden onset palpitations. She endorses fluttering sensation in her chest with associated mild jaw pain. She initially tried to change positions and waited for the symptoms to improve; however, due to persistent symptoms, she contacted EMS. Patient was noted to be in A. fib with rapid ventricular response with a rate of 180 beats per minute. Patient was then brought to the emergency department for further evaluation. Patient denies any similar episodes in the past. Of note, she endorses having 3-4 "heavy cocktails" tonight.  Patient was seen by Dr. Sallyanne Kuster in July 2015 after being admitted to hospital in May with non-ST elevation myocardial infarction thought to be due to stress-induced cardiomyopathy. At that point, a limited transthoracic echocardiogram showed left ventricular ejection fraction to 60%  Hospital Course  Atrial fibrillation with rapid ventricular response:  Her initial EKG suggested atrial fibrillation with rapid ventricular response. In the ED, she was started on diltiazem drip. Due  to her known history of stress-induced cardiomyopathy, she was switched to beta blockers to help with a history of stress-induced cardiomyopathy. From stroke risk prophylaxis standpoint, patient has elevated CHADS2Vasc score >2. Excessive alcohol intake could have been a trigger for her atrial fibrillation as well.  -- Converted to NSR early this morning. Feels well and wants to go home. Apparently taken off of Toprol when EF normalized about 1 month ago. She seemed to respond to b-blockers very well and has a history of cardiomyopathy, possibly Takatsubo's. Branch CAD by cath in 4/15. Reccommended to restart Toprol XL 25 mg daily. Eliquis has been added and should be continued. She is currently in NSR  History of Takotsubo cardiomyopathy now with recurrent LVEF: patient was noted to have stress-induced cardiomyopathy in May 2015 when she was treated with evidence based heart failure regimen. Her left ventricular ejection fraction is now recovered to 55-60% in 10/2013. She is currently endorsing NYHA class I symptoms.  -- Continue on omlesartan-HCTZ  -- Toprol XL 25mg  qd added on this admission   Hypertension:  -- Continue omlesartan-HCTZ  -- Starting metoprolol as mentioned above for rate control   Dyslipidemia  -- Continue atorvastatin 20 mg daily based on ACC AHA guidelines   Macular degeneration:  -- Continue Cosopt eyedrops  Hypokalemia- mild. K 3.5. given one dose of Kdur 43mEq before discharge.  -- Bmet in 1 week    The patient has had an uncomplicated hospital course and is recovering well.  She has been seen by Dr. Debara Pickett today and deemed ready for discharge home. All follow-up appointments have been scheduled. Discharge medications are listed below. She was resumed  on Toprol XL 25mg  and placed on Eliquis on this admission. She has a BMET scheduled in 1 week.    Discharge Vitals: Blood pressure 95/38, pulse 55, temperature 97.9 F (36.6 C), temperature source Oral, resp. rate 25,  height 5\' 2"  (1.575 m), weight 191 lb 9.3 oz (86.9 kg), SpO2 96.00%.  Labs: Lab Results  Component Value Date   WBC 7.3 12/08/2013   HGB 14.3 12/08/2013   HCT 41.3 12/08/2013   MCV 94.9 12/08/2013   PLT 286 12/08/2013     Recent Labs Lab 12/08/13 0224  NA 132*  K 3.5*  CL 91*  CO2 25  BUN 11  CREATININE 0.47*  CALCIUM 9.4  GLUCOSE 99    Lab Results  Component Value Date   CHOL 159 07/26/2013   HDL 89 07/26/2013   LDLCALC 53 07/26/2013   TRIG 86 07/26/2013     Diagnostic Studies/Procedures   Ct Head Wo Contrast  11/28/2013   CLINICAL DATA:  syncope, head injury  EXAM: CT HEAD WITHOUT CONTRAST  TECHNIQUE: Contiguous axial images were obtained from the base of the skull through the vertex without intravenous contrast.  COMPARISON:  None.  FINDINGS: Atherosclerotic and physiologic intracranial calcifications. Small falcine lipoma. Bilateral basal ganglia mineralization. Mild diffuse atrophy. There is no evidence of acute intracranial hemorrhage, brain edema, mass lesion, acute infarction, mass effect, or midline shift. Acute infarct may be inapparent on noncontrast CT. No other intra-axial abnormalities are seen, and the ventricles and sulci are within normal limits in size and symmetry. No abnormal extra-axial fluid collections or masses are identified. No significant calvarial abnormality.  IMPRESSION: 1. Negative for bleed or other acute intracranial process.   Electronically Signed   By: Arne Cleveland M.D.   On: 11/28/2013 21:33   Dg Chest Port 1 View  12/08/2013   CLINICAL DATA:  Jaw pain. Slight shortness of breath. Atrial fibrillation.  EXAM: PORTABLE CHEST - 1 VIEW  COMPARISON:  07/25/2013  FINDINGS: The heart size and mediastinal contours are within normal limits. Both lungs are clear. The visualized skeletal structures are unremarkable. Improved interstitial changes since previous study.  IMPRESSION: No active disease.   Electronically Signed   By: Lucienne Capers M.D.   On:  12/08/2013 02:37    Discharge Medications     Medication List         acetaminophen 500 MG tablet  Commonly known as:  TYLENOL  Take 1,000 mg by mouth at bedtime as needed for mild pain.     apixaban 5 MG Tabs tablet  Commonly known as:  ELIQUIS  Take 1 tablet (5 mg total) by mouth 2 (two) times daily.     atorvastatin 20 MG tablet  Commonly known as:  LIPITOR  Take 20 mg by mouth at bedtime.     dorzolamide-timolol 22.3-6.8 MG/ML ophthalmic solution  Commonly known as:  COSOPT  Place 1 drop into both eyes 2 (two) times daily.     fexofenadine 180 MG tablet  Commonly known as:  ALLEGRA  Take 180 mg by mouth daily.     HYDROcodone-acetaminophen 5-325 MG per tablet  Commonly known as:  NORCO/VICODIN  Take 1 tablet by mouth daily.     latanoprost 0.005 % ophthalmic solution  Commonly known as:  XALATAN  Place 1 drop into both eyes at bedtime.     metoprolol succinate 25 MG 24 hr tablet  Commonly known as:  TOPROL-XL  Take 1 tablet (25 mg total) by mouth daily.  nitroGLYCERIN 0.4 MG SL tablet  Commonly known as:  NITROSTAT  Place 0.4 mg under the tongue every 5 (five) minutes as needed for chest pain.     olmesartan-hydrochlorothiazide 20-12.5 MG per tablet  Commonly known as:  BENICAR HCT  Take 1 tablet by mouth daily.        Disposition   The patient will be discharged in stable condition to home.  Follow-up Information   Follow up with CROITORU,MIHAI, MD. (The office will contact you to make an appointment in the next 1-2 weeks. Please call them if you do not hear by tomorrow afternoon )    Specialty:  Cardiology   Contact information:   [address] [place] [address] [phone]       Follow up with [facility] On 12/15/2013. (Please go for lab work any time between 8am and 4pm.)    Contact information:   [place] [place] [address] [phone]        Duration of Discharge Encounter: Greater  than 30 minutes including physician and PA time.  Audrie Gallus, Chailyn Racette R PA-C 12/08/2013, 1:08 PM

## 2013-12-08 NOTE — Care Management Note (Signed)
    Page 1 of 1   12/08/2013     11:32:01 AM CARE MANAGEMENT NOTE 12/08/2013  Patient:  Melinda [name], Melinda [name]   Account Number:  [id number]  Date Initiated:  12/08/2013  Documentation initiated by:  Elissa Hefty  Subjective/Objective Assessment:   adm w at fib     Action/Plan:   lives w husband, pcp dr Margaretha Sheffield griffin   Anticipated DC Date:     Anticipated DC Plan:           Choice offered to / List presented to:             Status of service:   Medicare Important Message given?   (If response is "NO", the following Medicare IM given date fields will be blank) Date Medicare IM given:   Medicare IM given by:   Date Additional Medicare IM given:   Additional Medicare IM given by:    Discharge Disposition:    Per UR Regulation:  Reviewed for med. necessity/level of care/duration of stay  If discussed at [place] of Stay Meetings, dates discussed:    Comments:  9/10 1130 debbie Joyell Emami rn,bsn pt has to pay 50% of her meds. copay for eliquis would be 480.34 per month and copay for xarelto would be 483.96 per month if she goes home on one of these.

## 2013-12-08 NOTE — Progress Notes (Signed)
Converted to NSR early this morning. Feels well and wants to go home. Apparently taken off of Toprol when EF normalized about 1 month ago. She seemed to respond to b-blockers very well and has a history of cardiomyopathy, possibly Takatsubo's. Branch CAD by cath in 4/15. I would recommend restarting Toprol XL 25 mg daily. Eliquis has been added and should be continued. She is currently in NSR. Exam RRR, no murmur, lungs clear, abd s/nt, no edema, 2+ pulses, no focal neurologic findings. Troponin is negative. Will need potassium 40 MEQ x 1 today. Calvin for d/c home. Repeat BMP in 1 week. Follow-up with Dr. Loletha Grayer in the [facility] office.  Pixie Casino, MD, [facility] Attending Cardiologist [place]

## 2013-12-08 NOTE — Progress Notes (Signed)
STopped cardizem drip per Dr Serita Grit order. Given PO metoprolol. At [phone] patient converted to NSR. HR now 70-80s.

## 2013-12-08 NOTE — ED Notes (Signed)
Confirmed with Cardiology to keep cardizem rate at current rate. Cardiology states he will look at chart and change orders as necessary.

## 2013-12-08 NOTE — ED Notes (Signed)
Cardiology at BS

## 2013-12-08 NOTE — ED Provider Notes (Signed)
CSN: [id number]     Arrival date & time 12/08/13  0203 History   First MD Initiated Contact with Patient 12/08/13 [phone]     Chief Complaint  Patient presents with  . Atrial Fibrillation     (Consider location/radiation/quality/duration/timing/severity/associated sxs/prior Treatment) HPI 79 year old female presents to emergency department from home via EMS with complaint of palpitations.  EMS reports in route her heart rate initially 180, she was given 20 mg of diltiazem and rate came down to 130s.  Patient denies any chest pain.  She reports palpitations and feelings of anxiousness.  Past medical history significant for Takotsubo cardiomyopathy in April of this year.  Ejection fraction at that time 25%, improved to 60%, at most recent echo in August.  No prior history of A. fib.  Patient is on no blood thinners.  She has history of allergy to aspirin and NSAIDs. Past Medical History  Diagnosis Date  . Hyperlipidemia   . Allergy   . Hypertension   . Cancer     bilateral breast  . Osteopenia   . GERD (gastroesophageal reflux disease)   . Macular degeneration     right eye  . Myocardial infarction   . Anemia 1984    before hysterectomy  . History of blood product transfusion 1984  . Takotsubo cardiomyopathy    Past Surgical History  Procedure Laterality Date  . Breast lumpectomy  04/19/04    left with sentinel node  . Abdominal hysterectomy  1984  . Breast biopsy  04/29/05    right   . Cataract extraction      bilateral  . Retinal laser procedure      right  . Appendectomy    . Eye surgery     Family History  Problem Relation Age of Onset  . Dementia Mother   . Stroke Mother   . Heart disease Mother 31  . Cancer Father     Prostate  . Heart disease Father 16  . Cancer Maternal Aunt     Breast  . Cancer Maternal Grandmother   . Heart attack Maternal Grandfather   . Heart attack Paternal Grandfather    History  Substance Use Topics  . Smoking status: Former Research scientist (life sciences)   . Smokeless tobacco: Never Used  . Alcohol Use: 1.8 oz/week    3 Shots of liquor per week     Comment: 2-3 drinks containing a shot each of whiskey per night   OB History   Grav Para Term Preterm Abortions TAB SAB Ect Mult Living                 Review of Systems  See History of Present Illness; otherwise all other systems are reviewed and negative   Allergies  Nsaids and Salicylates  Home Medications   Prior to Admission medications   Medication Sig Start Date End Date Taking? Authorizing Provider  acetaminophen (TYLENOL) 500 MG tablet Take 1,000 mg by mouth at bedtime as needed for mild pain.   Yes Historical Provider, MD  atorvastatin (LIPITOR) 20 MG tablet Take 20 mg by mouth at bedtime.    Yes Historical Provider, MD  dorzolamide-timolol (COSOPT) 22.3-6.8 MG/ML ophthalmic solution Place 1 drop into both eyes 2 (two) times daily.  08/04/13  Yes Historical Provider, MD  fexofenadine (ALLEGRA) 180 MG tablet Take 180 mg by mouth daily.    Yes Historical Provider, MD  HYDROcodone-acetaminophen (NORCO) 5-325 MG per tablet Take 1 tablet by mouth daily.  01/09/11  Yes Historical Provider,  MD  latanoprost (XALATAN) 0.005 % ophthalmic solution Place 1 drop into both eyes at bedtime.  06/17/13  Yes Historical Provider, MD  olmesartan-hydrochlorothiazide (BENICAR HCT) 20-12.5 MG per tablet Take 1 tablet by mouth daily.   Yes Historical Provider, MD  nitroGLYCERIN (NITROSTAT) 0.4 MG SL tablet Place 0.4 mg under the tongue every 5 (five) minutes as needed for chest pain. 07/27/13   Tarri Fuller, PA-C   BP 109/71  Pulse 107  Temp(Src) 97.1 F (36.2 C) (Oral)  Resp 23  Ht 5\' 3"  (1.6 m)  Wt 190 lb (86.183 kg)  BMI 33.67 kg/m2  SpO2 93% Physical Exam  Nursing note and vitals reviewed. Constitutional: She is oriented to person, place, and time. She appears well-developed and well-nourished. She appears distressed (anxious appearing).  HENT:  Head: Normocephalic and atraumatic.  Nose: Nose  normal.  Mouth/Throat: Oropharynx is clear and moist.  Eyes: Conjunctivae and EOM are normal. Pupils are equal, round, and reactive to light.  Neck: Normal range of motion. Neck supple. No JVD present. No tracheal deviation present. No thyromegaly present.  Cardiovascular: Normal heart sounds and intact distal pulses.  Exam reveals no gallop and no friction rub.   No murmur heard. Tachycardia with irregular rate and rhythm  Pulmonary/Chest: Effort normal and breath sounds normal. No stridor. No respiratory distress. She has no wheezes. She has no rales. She exhibits no tenderness.  Abdominal: Soft. Bowel sounds are normal. She exhibits no distension and no mass. There is no tenderness. There is no rebound and no guarding.  Musculoskeletal: Normal range of motion. She exhibits no edema and no tenderness.  Lymphadenopathy:    She has no cervical adenopathy.  Neurological: She is alert and oriented to person, place, and time. She displays normal reflexes. She exhibits normal muscle tone. Coordination normal.  Skin: Skin is warm and dry. No rash noted. No erythema. No pallor.  Psychiatric: She has a normal mood and affect. Her behavior is normal. Judgment and thought content normal.    ED Course  Procedures (including critical care time) Labs Review Labs Reviewed  BASIC METABOLIC PANEL - Abnormal; Notable for the following:    Sodium 132 (*)    Potassium 3.5 (*)    Chloride 91 (*)    Creatinine, Ser 0.47 (*)    Anion gap 16 (*)    All other components within normal limits  CBC WITH DIFFERENTIAL  Randolm Idol, ED    Imaging Review Dg Chest Port 1 View  12/08/2013   CLINICAL DATA:  Jaw pain. Slight shortness of breath. Atrial fibrillation.  EXAM: PORTABLE CHEST - 1 VIEW  COMPARISON:  07/25/2013  FINDINGS: The heart size and mediastinal contours are within normal limits. Both lungs are clear. The visualized skeletal structures are unremarkable. Improved interstitial changes since  previous study.  IMPRESSION: No active disease.   Electronically Signed   By: Lucienne Capers M.D.   On: 12/08/2013 02:37     EKG Interpretation   Date/Time:  Thursday December 08 2013 02:09:50 EDT Ventricular Rate:  157 PR Interval:  84 QRS Duration: 85 QT Interval:  287 QTC Calculation: 464 R Axis:   53 Text Interpretation:  Atrial fibrillation with rapid V-rate Repolarization  abnormality, prob rate related Confirmed by Aadvika Konen  MD, Jonavan Vanhorn ([id number]) on  12/08/2013 3:06:22 AM     CRITICAL CARE Performed by: Kalman Drape Total critical care time: 60 min Critical care time was exclusive of separately billable procedures and treating other patients. Critical care was  necessary to treat or prevent imminent or life-threatening deterioration. Critical care was time spent personally by me on the following activities: development of treatment plan with patient and/or surrogate as well as nursing, discussions with consultants, evaluation of patient's response to treatment, examination of patient, obtaining history from patient or surrogate, ordering and performing treatments and interventions, ordering and review of laboratory studies, ordering and review of radiographic studies, pulse oximetry and re-evaluation of patient's condition.  MDM   Final diagnoses:  Atrial fibrillation with rapid ventricular response   79 year old female with atrial fibrillation with RVR.  Patient's rate has been controlled with diltiazem.  Patient feeling better with rate control.  Case discussed with on-call cardiology who will admit.   Kalman Drape, MD 12/08/13 [phone]

## 2013-12-08 NOTE — H&P (Signed)
Cardiology Admission H&P Note   Assessment and Plan:  *Atrial fibrillation with rapid ventricular response: Melinda [name] is a pleasant 54 her old female with history of stress-induced cardiomyopathy, hypertension, dyslipidemia comes to the emergency department with acute onset palpitations with associated jaw pain. Her initial EKG suggested atrial fibrillation with rapid ventricular response. In the ED, she was started on diltiazem drip. Due to her known history of stress-induced cardiomyopathy, I will instead switch her over to beta blockers to help with a history of stress-induced cardiomyopathy. From stroke risk prophylaxis standpoint, patient has elevated CHADS2Vasc score >2. Excessive alcohol intake could have been a trigger for her atrial fibrillation as well. -- Start metoprolol tartrate 25 mg by mouth every 6 hours. -- Start Eliquis 5 mg by mouth twice a day -- N.p.o. now for cardioversion as it is her first episode of non-A. fib. Patient's symptoms started less than 48 hours ago therefore she will not need a TEE.  *History of Takotsubo cardiomyopathy now with recurrent LVEF: patient was noted to have stress-induced cardiomyopathy in May 2015 when she was treated with evidence based heart failure regimen. Her left ventricular ejection fraction is now recovered to 55-60% in 10/2013. She is currently endorsing NYHA class I symptoms. -- We will start metoprolol tartrate 25 mg by mouth every 6 hours -- Continue on omlesartan-HCTZ  *Hypertension: -- Continue omlesartan-HCTZ -- Starting metoprolol as mentioned above for rate control  *Dyslipidemia -- Continue atorvastatin 20 mg daily based on ACC AHA guidelines  *Macular degeneration: -- Continue Cosopt eyedrops  *FEN GI: -- N.p.o. Now for DCCV.  *Prophylaxis:  -- No indication for GI prophylaxis -- Start Eliquis   Full Code  Chief complaint: palpations Primary cardiologist: Dr. Sanda Klein  HPI:  Melinda [name] is a pleasant  78 her old female with history of stress-induced cardiomyopathy (30%--> 60%), hypertension, dyslipidemia who comes today emergency department with 4 hour history of sudden onset palpitations, jaw pain and mild diaphoresis. Patient states that she was in her usual state of health until 12:40 AM when she woke up with sudden onset palpitations. She endorses fluttering sensation in her chest with associated mild jaw pain. She initially tried to change positions and waited for the symptoms to improve; however, due to persistent symptoms, she contacted EMS. Patient was noted to be in A. fib with rapid ventricular response with a rate of 180 beats per minute. Patient was then brought to the emergency department for further evaluation. In the ED, patient was started on diltiazem drip with heart rate on my evaluation ranging from the low 100s to 120s. Patient denies any similar episodes in the past. Of note, she endorses having 3-4 "heavy cocktails" tonight.  Patient was  seen by Dr. Sallyanne Kuster in July 2015 after being admitted to hospital in May with non-ST elevation myocardial infarction thought to be due to stress-induced cardiomyopathy. At that point, a limited transthoracic echocardiogram showed left ventricular ejection fraction to 60%  Cardiac history:  Stress-induced cardiomyopathy  Hypertension  Dyslipidemia  New onset A. fib 12/08/2013  CHAD2Vasc score >2  Previous cardiac imaging  EKG 12/08/2013: atrial fibrillation with rapid ventricular response  EKG 11/27/2013: Normal sinus rhythm  TTE 11/04/2013: LVEF 32-95%, grade 1 diastolic dysfunction, aortic sclerosis, trivial regurgitation, normal left atrium  NM Stress test 09/17/2012: Normal stress nuclear study  Prior cath:  Left heart catheterization 07/26/2013: 75% ostial OM1 stenosis. LV gram showed EF 25% with stress and his cardio myopathy pattern. Elevated left ventricular end diastolic pressure  of 22 mmHg.  Past Medical History Past  Medical History  Diagnosis Date  . Hyperlipidemia   . Allergy   . Hypertension   . Cancer     bilateral breast  . Osteopenia   . GERD (gastroesophageal reflux disease)   . Macular degeneration     right eye  . Myocardial infarction   . Anemia 1984    before hysterectomy  . History of blood product transfusion 1984  . Takotsubo cardiomyopathy     Allergies: Allergies  Allergen Reactions  . Nsaids Other (See Comments)    Extreme hives and difficulty breathing. "Has had to go to the ER."  . Salicylates Other (See Comments)    Extreme hives and difficulty breathing. "Has had to go to the ER."   Medications: Current Facility-Administered Medications  Medication Dose Route Frequency Provider Last Rate Last Dose  . diltiazem (CARDIZEM) 100 mg in dextrose 5 % 100 mL (1 mg/mL) infusion  5-15 mg/hr Intravenous Continuous Kalman Drape, MD 15 mL/hr at 12/08/13 0218 15 mg/hr at 12/08/13 0218   Current Outpatient Prescriptions  Medication Sig Dispense Refill  . acetaminophen (TYLENOL) 500 MG tablet Take 1,000 mg by mouth at bedtime as needed for mild pain.      Marland Kitchen atorvastatin (LIPITOR) 20 MG tablet Take 20 mg by mouth at bedtime.       . dorzolamide-timolol (COSOPT) 22.3-6.8 MG/ML ophthalmic solution Place 1 drop into both eyes 2 (two) times daily.       . fexofenadine (ALLEGRA) 180 MG tablet Take 180 mg by mouth daily.       Marland Kitchen HYDROcodone-acetaminophen (NORCO) 5-325 MG per tablet Take 1 tablet by mouth daily.       Marland Kitchen latanoprost (XALATAN) 0.005 % ophthalmic solution Place 1 drop into both eyes at bedtime.       Marland Kitchen olmesartan-hydrochlorothiazide (BENICAR HCT) 20-12.5 MG per tablet Take 1 tablet by mouth daily.      . nitroGLYCERIN (NITROSTAT) 0.4 MG SL tablet Place 0.4 mg under the tongue every 5 (five) minutes as needed for chest pain.       Social History History   Social History  . Marital Status: Married    Spouse Name: N/A    Number of Children: N/A  . Years of Education: N/A    Occupational History  . Not on file.   Social History Main Topics  . Smoking status: Former Research scientist (life sciences)  . Smokeless tobacco: Never Used  . Alcohol Use: 1.8 oz/week    3 Shots of liquor per week     Comment: 2-3 drinks containing a shot each of whiskey per night  . Drug Use: No  . Sexual Activity: Not on file   Other Topics Concern  . Not on file   Social History Narrative  . No narrative on file    Family History Family History  Problem Relation Age of Onset  . Dementia Mother   . Stroke Mother   . Heart disease Mother 64  . Cancer Father     Prostate  . Heart disease Father 2  . Cancer Maternal Aunt     Breast  . Cancer Maternal Grandmother   . Heart attack Maternal Grandfather   . Heart attack Paternal Grandfather     Physical Exam Filed Vitals:   12/08/13 0330  BP: 115/57  Pulse: 54  Temp:   Resp: 26    Physical Exam  Labs: General: Comfortable appearing in no acute distress HEENT: Normocephalic, moist  mucous membranes Cardiovascular: Irregularly irregular, normal S2, variable S1, nauseous murmurs, +2 pulses in bilateral upper extremities, normal JVP Pulmonary: Clear to auscultation bilaterally anteriorly Abdomen: Soft, nontender, nondistended, obese Extremities: No cyanosis clubbing or edema Neurological: No focal gross neurological deficits

## 2013-12-08 NOTE — Plan of Care (Signed)
Problem: Phase I Progression Outcomes Goal: Heart rate or rhythm control medication Outcome: Progressing Stopped Cardizem and started on PO metoprolol. Patient converted to NSR shortly thereafter. Cardiology NP notified and aware of changes.

## 2013-12-08 NOTE — Discharge Instructions (Signed)
Atrial Fibrillation °Atrial fibrillation is a type of irregular heart rhythm (arrhythmia). During atrial fibrillation, the upper chambers of the heart (atria) quiver continuously in a chaotic pattern. This causes an irregular and often rapid heart rate.  °Atrial fibrillation is the result of the heart becoming overloaded with disorganized signals that tell it to beat. These signals are normally released one at a time by a part of the right atrium called the sinoatrial node. They then travel from the atria to the lower chambers of the heart (ventricles), causing the atria and ventricles to contract and pump blood as they pass. In atrial fibrillation, parts of the atria outside of the sinoatrial node also release these signals. This results in two problems. First, the atria receive so many signals that they do not have time to fully contract. Second, the ventricles, which can only receive one signal at a time, beat irregularly and out of rhythm with the atria.  °There are three types of atrial fibrillation:  °· Paroxysmal. Paroxysmal atrial fibrillation starts suddenly and stops on its own within a week. °· Persistent. Persistent atrial fibrillation lasts for more than a week. It may stop on its own or with treatment. °· Permanent. Permanent atrial fibrillation does not go away. Episodes of atrial fibrillation may lead to permanent atrial fibrillation. °Atrial fibrillation can prevent your heart from pumping blood normally. It increases your risk of stroke and can lead to heart failure.  °CAUSES  °· Heart conditions, including a heart attack, heart failure, coronary artery disease, and heart valve conditions.   °· Inflammation of the sac that surrounds the heart (pericarditis). °· Blockage of an artery in the lungs (pulmonary embolism). °· Pneumonia or other infections. °· Chronic lung disease. °· Thyroid problems, especially if the thyroid is overactive (hyperthyroidism). °· Caffeine, excessive alcohol use, and use  of some illegal drugs.   °· Use of some medicines, including certain decongestants and diet pills. °· Heart surgery.   °· Birth defects.   °Sometimes, no cause can be found. When this happens, the atrial fibrillation is called lone atrial fibrillation. The risk of complications from atrial fibrillation increases if you have lone atrial fibrillation and you are age 60 years or older. °RISK FACTORS °· Heart failure. °· Coronary artery disease. °· Diabetes mellitus.   °· High blood pressure (hypertension).   °· Obesity.   °· Other arrhythmias.   °· Increased age. °SIGNS AND SYMPTOMS  °· A feeling that your heart is beating rapidly or irregularly.   °· A feeling of discomfort or pain in your chest.   °· Shortness of breath.   °· Sudden light-headedness or weakness.   °· Getting tired easily when exercising.   °· Urinating more often than normal (mainly when atrial fibrillation first begins).   °In paroxysmal atrial fibrillation, symptoms may start and suddenly stop. °DIAGNOSIS  °Your health care provider may be able to detect atrial fibrillation when taking your pulse. Your health care provider may have you take a test called an ambulatory electrocardiogram (ECG). An ECG records your heartbeat patterns over a 24-hour period. You may also have other tests, such as: °· Transthoracic echocardiogram (TTE). During echocardiography, sound waves are used to evaluate how blood flows through your heart. °· Transesophageal echocardiogram (TEE). °· Stress test. There is more than one type of stress test. If a stress test is needed, ask your health care provider about which type is best for you. °· Chest X-ray exam. °· Blood tests. °· Computed tomography (CT). °TREATMENT  °Treatment may include: °· Treating any underlying conditions. For example, if you   have an overactive thyroid, treating the condition may correct atrial fibrillation.  Taking medicine. Medicines may be given to control a rapid heart rate or to prevent blood  clots, heart failure, or a stroke.  Having a procedure to correct the rhythm of the heart:  Electrical cardioversion. During electrical cardioversion, a controlled, low-energy shock is delivered to the heart through your skin. If you have chest pain, very low blood pressure, or sudden heart failure, this procedure may need to be done as an emergency.  Catheter ablation. During this procedure, heart tissues that send the signals that cause atrial fibrillation are destroyed.  Surgical ablation. During this surgery, thin lines of heart tissue that carry the abnormal signals are destroyed. This procedure can either be an open-heart surgery or a minimally invasive surgery. With the minimally invasive surgery, small cuts are made to access the heart instead of a large opening.  Pulmonary venous isolation. During this surgery, tissue around the veins that carry blood from the lungs (pulmonary veins) is destroyed. This tissue is thought to carry the abnormal signals. HOME CARE INSTRUCTIONS   Take medicines only as directed by your health care provider. Some medicines can make atrial fibrillation worse or recur.  If blood thinners were prescribed by your health care provider, take them exactly as directed. Too much blood-thinning medicine can cause bleeding. If you take too little, you will not have the needed protection against stroke and other problems.  Perform blood tests at home if directed by your health care provider. Perform blood tests exactly as directed.  Quit smoking if you smoke.  Do not drink alcohol.  Do not drink caffeinated beverages such as coffee, soda, and some teas. You may drink decaffeinated coffee, soda, or tea.   Maintain a healthy weight.Do not use diet pills unless your health care provider approves. They may make heart problems worse.   Follow diet instructions as directed by your health care provider.  Exercise regularly as directed by your health care  provider.  Keep all follow-up visits as directed by your health care provider. This is important. PREVENTION  The following substances can cause atrial fibrillation to recur:   Caffeinated beverages.  Alcohol.  Certain medicines, especially those used for breathing problems.  Certain herbs and herbal medicines, such as those containing ephedra or ginseng.  Illegal drugs, such as cocaine and amphetamines. Sometimes medicines are given to prevent atrial fibrillation from recurring. Proper treatment of any underlying condition is also important in helping prevent recurrence.  SEEK MEDICAL CARE IF:  You notice a change in the rate, rhythm, or strength of your heartbeat.  You suddenly begin urinating more frequently.  You tire more easily when exerting yourself or exercising. SEEK IMMEDIATE MEDICAL CARE IF:   You have chest pain, abdominal pain, sweating, or weakness.  You feel nauseous.  You have shortness of breath.  You suddenly have swollen feet and ankles.  You feel dizzy.  Your face or limbs feel numb or weak.  You have a change in your vision or speech. MAKE SURE YOU:   Understand these instructions.  Will watch your condition.  Will get help right away if you are not doing well or get worse. Document Released: 03/17/2005 Document Revised: 08/01/2013 Document Reviewed: 04/27/2012 [facility] Patient Information 2015 [place], [place]. This information is not intended to replace advice given to you by your health care provider. Make sure you discuss any questions you have with your health care provider.   Information on my medicine -  ELIQUIS (apixaban)  This medication education was reviewed with me or my healthcare representative as part of my discharge preparation.  The pharmacist that spoke with me during my hospital stay was:  Georgina Peer, [facility]  Why was Eliquis prescribed for you? Eliquis was prescribed for you to reduce the risk of a blood clot forming  that can cause a stroke if you have a medical condition called atrial fibrillation (a type of irregular heartbeat).  What do You need to know about Eliquis ? Take your Eliquis TWICE DAILY - one tablet in the morning and one tablet in the evening with or without food. If you have difficulty swallowing the tablet whole please discuss with your pharmacist how to take the medication safely.  Take Eliquis exactly as prescribed by your doctor and DO NOT stop taking Eliquis without talking to the doctor who prescribed the medication.  Stopping may increase your risk of developing a stroke.  Refill your prescription before you run out.  After discharge, you should have regular check-up appointments with your healthcare provider that is prescribing your Eliquis.  In the future your dose may need to be changed if your kidney function or weight changes by a significant amount or as you get older.  What do you do if you miss a dose? If you miss a dose, take it as soon as you remember on the same day and resume taking twice daily.  Do not take more than one dose of ELIQUIS at the same time to make up a missed dose.  Important Safety Information A possible side effect of Eliquis is bleeding. You should call your healthcare provider right away if you experience any of the following:   Bleeding from an injury or your nose that does not stop.   Unusual colored urine (red or dark brown) or unusual colored stools (red or black).   Unusual bruising for unknown reasons.   A serious fall or if you hit your head (even if there is no bleeding).  Some medicines may interact with Eliquis and might increase your risk of bleeding or clotting while on Eliquis. To help avoid this, consult your healthcare provider or pharmacist prior to using any new prescription or non-prescription medications, including herbals, vitamins, non-steroidal anti-inflammatory drugs (NSAIDs) and supplements.  This website has more  information on Eliquis (apixaban): http://www.eliquis.com/eliquis/home

## 2013-12-08 NOTE — ED Notes (Signed)
Per EDP, cardizem drip rate set to 20.

## 2013-12-09 ENCOUNTER — Telehealth: Payer: Self-pay | Admitting: *Deleted

## 2013-12-09 ENCOUNTER — Telehealth: Payer: Self-pay | Admitting: Cardiovascular Disease

## 2013-12-09 NOTE — Telephone Encounter (Signed)
Eliquis 5 mg 3 boxes (14  sample pills each)  place at the front desk. Samples requested by Raiford Simmonds RN at [email or web address].

## 2013-12-09 NOTE — Telephone Encounter (Signed)
Close encounter 

## 2013-12-09 NOTE — Telephone Encounter (Signed)
Spoke to patient Per Heath PA, it is okay to go [place]. Patient verbalized understanding.

## 2013-12-09 NOTE — Telephone Encounter (Signed)
Order for BMP placed by PA on discharge.  LM with this info for patient.

## 2013-12-09 NOTE — Telephone Encounter (Signed)
Pt was just release from the hospital and she was instructed to have some labs done to check her potassium levels. She needs some orders put in. She said she would be coming in 9/14 to have those done.  Thanks

## 2013-12-09 NOTE — Telephone Encounter (Signed)
Patient discharge yesterday on ELIQUIS  husband walk in for samples No samples available [place] -spoke to triage--they has some- husband is aware Will go a pick up 3 boxes of samples  He also wants to know about if patient can go to [place] next week RN informed of the answer. husband verbalized understanding

## 2013-12-12 ENCOUNTER — Telehealth: Payer: Self-pay | Admitting: Cardiovascular Disease

## 2013-12-12 ENCOUNTER — Other Ambulatory Visit: Payer: Self-pay | Admitting: *Deleted

## 2013-12-12 ENCOUNTER — Other Ambulatory Visit: Payer: Self-pay

## 2013-12-12 ENCOUNTER — Telehealth: Payer: Self-pay

## 2013-12-12 DIAGNOSIS — I10 Essential (primary) hypertension: Secondary | ICD-10-CM | POA: Diagnosis not present

## 2013-12-12 DIAGNOSIS — I214 Non-ST elevation (NSTEMI) myocardial infarction: Secondary | ICD-10-CM

## 2013-12-12 DIAGNOSIS — I480 Paroxysmal atrial fibrillation: Secondary | ICD-10-CM

## 2013-12-12 LAB — BASIC METABOLIC PANEL
BUN: 11 mg/dL (ref 6–23)
CO2: 30 meq/L (ref 19–32)
Calcium: 9.5 mg/dL (ref 8.4–10.5)
Chloride: 94 mEq/L — ABNORMAL LOW (ref 96–112)
Creat: 0.56 mg/dL (ref 0.50–1.10)
GLUCOSE: 83 mg/dL (ref 70–99)
Potassium: 4.2 mEq/L (ref 3.5–5.3)
SODIUM: 133 meq/L — AB (ref 135–145)

## 2013-12-12 NOTE — Telephone Encounter (Signed)
Received call from Alexander at [facility]. Bmet order faxed to her at fax # [phone].

## 2013-12-12 NOTE — Telephone Encounter (Signed)
Received call from [facility] lab needing a lab order for patient.Bmet order placed.

## 2013-12-15 ENCOUNTER — Encounter: Payer: Self-pay | Admitting: Physician Assistant

## 2013-12-15 ENCOUNTER — Telehealth: Payer: Self-pay | Admitting: Physician Assistant

## 2013-12-16 NOTE — Telephone Encounter (Signed)
Close encounter 

## 2013-12-30 ENCOUNTER — Ambulatory Visit: Payer: No Typology Code available for payment source | Admitting: Physician Assistant

## 2014-01-02 ENCOUNTER — Encounter (HOSPITAL_COMMUNITY)
Admission: RE | Admit: 2014-01-02 | Discharge: 2014-01-02 | Disposition: A | Discharge status: Home / Self Care | Payer: Self-pay | Source: Ambulatory Visit | Attending: Cardiovascular Disease | Admitting: Cardiovascular Disease

## 2014-01-02 DIAGNOSIS — I480 Paroxysmal atrial fibrillation: Secondary | ICD-10-CM | POA: Insufficient documentation

## 2014-01-02 DIAGNOSIS — Z79891 Long term (current) use of opiate analgesic: Secondary | ICD-10-CM | POA: Insufficient documentation

## 2014-01-02 DIAGNOSIS — Z888 Allergy status to other drugs, medicaments and biological substances status: Secondary | ICD-10-CM | POA: Insufficient documentation

## 2014-01-02 DIAGNOSIS — Z79899 Other long term (current) drug therapy: Secondary | ICD-10-CM | POA: Insufficient documentation

## 2014-01-02 DIAGNOSIS — Z7901 Long term (current) use of anticoagulants: Secondary | ICD-10-CM | POA: Insufficient documentation

## 2014-01-02 DIAGNOSIS — I5181 Takotsubo syndrome: Secondary | ICD-10-CM | POA: Insufficient documentation

## 2014-01-02 DIAGNOSIS — Z87891 Personal history of nicotine dependence: Secondary | ICD-10-CM | POA: Insufficient documentation

## 2014-01-04 ENCOUNTER — Encounter (HOSPITAL_COMMUNITY): Payer: Self-pay

## 2014-01-04 ENCOUNTER — Ambulatory Visit: Payer: No Typology Code available for payment source | Admitting: Physician Assistant

## 2014-01-06 ENCOUNTER — Encounter (HOSPITAL_COMMUNITY)
Admission: RE | Admit: 2014-01-06 | Discharge: 2014-01-06 | Disposition: A | Discharge status: Home / Self Care | Payer: Self-pay | Source: Ambulatory Visit | Attending: Cardiovascular Disease | Admitting: Cardiovascular Disease

## 2014-01-09 ENCOUNTER — Encounter (HOSPITAL_COMMUNITY)
Admission: RE | Admit: 2014-01-09 | Discharge: 2014-01-09 | Disposition: A | Discharge status: Home / Self Care | Payer: Self-pay | Source: Ambulatory Visit | Attending: Cardiovascular Disease | Admitting: Cardiovascular Disease

## 2014-01-10 ENCOUNTER — Ambulatory Visit (INDEPENDENT_AMBULATORY_CARE_PROVIDER_SITE_OTHER): Payer: Medicare Other | Admitting: Cardiology

## 2014-01-10 ENCOUNTER — Encounter: Payer: Self-pay | Admitting: Cardiology

## 2014-01-10 VITALS — BP 144/76 | HR 68 | Ht 63.0 in | Wt 190.7 lb

## 2014-01-10 DIAGNOSIS — I480 Paroxysmal atrial fibrillation: Secondary | ICD-10-CM

## 2014-01-10 DIAGNOSIS — I249 Acute ischemic heart disease, unspecified: Secondary | ICD-10-CM | POA: Diagnosis not present

## 2014-01-10 NOTE — Progress Notes (Signed)
Patient ID: BLIMA JAIMES, female   DOB: Jun 06, 1934, 79 y.o.   MRN: [id number]    01/10/2014 ALEXICA SCHLOSSBERG   1934-12-29  [id number]  Primary Physicia Osborne Casco, MD Primary Cardiologist: Dr.  Delrae Alfred  HPI:  The patient is a 79 year old female, followed By Dr. Sallyanne Kuster, who presents to clinic today for post-hospital followup. She has a history significant for stress-induced cardiomyopathy in May of this year. EF at that time was 30% and normalize back to 60% after treatment with evidence-based heart failure regimen. She was recently admitted to [facility] 12/08/2013 for new onset atrial fibrillation with RVR. TSH was normal as were cardiac enzymes. She spontaneously converted to normal sinus rhythm. She was discharged home on Toprol XL 25 mg daily. She was also placed on Eliquis for stroke prophylaxis given an elevated  CHADS VASC score of greater than 2.  She presents back to clinic today for posthospital followup. Since discharge, she denies any recurrent symptoms. She denies palpiations, dyspnea, chest pain, dizziness, syncope/near syncope. Her EKG demonstrates normal sinus rhythm. Ventricular rate is 60 beats per minute. She reports full medication compliance. She is tolerating Eliquis okay. No abnormal bleeding or falls. Her main concern regarding Eliquis is the high monthly co-pay.   Current Outpatient Prescriptions  Medication Sig Dispense Refill  . acetaminophen (TYLENOL) 500 MG tablet Take 1,000 mg by mouth at bedtime as needed for mild pain.      Marland Kitchen apixaban (ELIQUIS) 5 MG TABS tablet Take 1 tablet (5 mg total) by mouth 2 (two) times daily.  60 tablet  1  . atorvastatin (LIPITOR) 20 MG tablet Take 20 mg by mouth at bedtime.       . dorzolamide-timolol (COSOPT) 22.3-6.8 MG/ML ophthalmic solution Place 1 drop into both eyes 2 (two) times daily.       . fexofenadine (ALLEGRA) 180 MG tablet Take 180 mg by mouth daily.       Marland Kitchen HYDROcodone-acetaminophen (NORCO) 5-325 MG per  tablet Take 1 tablet by mouth daily.       Marland Kitchen latanoprost (XALATAN) 0.005 % ophthalmic solution Place 1 drop into both eyes at bedtime.       . metoprolol succinate (TOPROL-XL) 25 MG 24 hr tablet Take 1 tablet (25 mg total) by mouth daily.  30 tablet  2  . nitroGLYCERIN (NITROSTAT) 0.4 MG SL tablet Place 0.4 mg under the tongue every 5 (five) minutes as needed for chest pain.      Marland Kitchen olmesartan-hydrochlorothiazide (BENICAR HCT) 20-12.5 MG per tablet Take 1 tablet by mouth daily.      . sertraline (ZOLOFT) 25 MG tablet        No current facility-administered medications for this visit.    Allergies  Allergen Reactions  . Nsaids Other (See Comments)    Extreme hives and difficulty breathing. "Has had to go to the ER."  . Salicylates Other (See Comments)    Extreme hives and difficulty breathing. "Has had to go to the ER."    History   Social History  . Marital Status: Married    Spouse Name: N/A    Number of Children: N/A  . Years of Education: N/A   Occupational History  . Not on file.   Social History Main Topics  . Smoking status: Former Research scientist (life sciences)  . Smokeless tobacco: Never Used  . Alcohol Use: 1.8 oz/week    3 Shots of liquor per week     Comment: 2-3 drinks containing a shot each of whiskey per  night  . Drug Use: No  . Sexual Activity: Not on file   Other Topics Concern  . Not on file   Social History Narrative  . No narrative on file     Review of Systems: General: negative for chills, fever, night sweats or weight changes.  Cardiovascular: negative for chest pain, dyspnea on exertion, edema, orthopnea, palpitations, paroxysmal nocturnal dyspnea or shortness of breath Dermatological: negative for rash Respiratory: negative for cough or wheezing Urologic: negative for hematuria Abdominal: negative for nausea, vomiting, diarrhea, bright red blood per rectum, melena, or hematemesis Neurologic: negative for visual changes, syncope, or dizziness All other systems  reviewed and are otherwise negative except as noted above.    Blood pressure 144/76, pulse 68, height 5\' 3"  (1.6 m), weight 190 lb 11.2 oz (86.501 kg).  General appearance: alert, cooperative and no distress Neck: no carotid bruit and no JVD Lungs: clear to auscultation bilaterally Heart: regular rate and rhythm, S1, S2 normal, no murmur, click, rub or gallop Extremities: no LEE Pulses: 2+ and symmetric Skin: warm and dry Neurologic: Grossly normal  EKG NSR, 60 bpm  ASSESSMENT AND PLAN:   1. PAF: in NSR. No further recurrence. Continue 25 mg of Toprol -XL. We also discussed avoidance of triggers: excessive caffeine and alcohol intake.   2. Chronic Anticoagulation: on Eliquis. Patient notes high monthly co-pay but is not 100% sure about switching to Warfarin. She did not receive free 30 day card at discharge. Will give today. Pt also instructed to call 1-800 number to see if she will qualify for assistance/lower co-pay. If she chooses to switch to Warfarin, she will call to set up an appointment with our office pharmacist to help make the transition.   PLAN  No change in medical therapy. F/U with Dr. Sallyanne Kuster in 6 months.   Tayli Buch, BRITTAINYPA-C 01/10/2014 1:47 PM

## 2014-01-10 NOTE — Patient Instructions (Signed)
No changes have been made in your current medications or treatment plan.  Lyda Jester, PA-C, recommends that you schedule a follow-up appointment in 8 months (May 2016) with Dr Sallyanne Kuster.

## 2014-01-11 ENCOUNTER — Encounter (HOSPITAL_COMMUNITY): Payer: Self-pay

## 2014-01-13 ENCOUNTER — Encounter (HOSPITAL_COMMUNITY)
Admission: RE | Admit: 2014-01-13 | Discharge: 2014-01-13 | Disposition: A | Discharge status: Home / Self Care | Payer: Self-pay | Source: Ambulatory Visit | Attending: Cardiovascular Disease | Admitting: Cardiovascular Disease

## 2014-01-16 ENCOUNTER — Encounter (HOSPITAL_COMMUNITY)
Admission: RE | Admit: 2014-01-16 | Discharge: 2014-01-16 | Disposition: A | Discharge status: Home / Self Care | Payer: Self-pay | Source: Ambulatory Visit | Attending: Cardiovascular Disease | Admitting: Cardiovascular Disease

## 2014-01-18 ENCOUNTER — Encounter (HOSPITAL_COMMUNITY)
Admission: RE | Admit: 2014-01-18 | Discharge: 2014-01-18 | Disposition: A | Discharge status: Home / Self Care | Payer: Self-pay | Source: Ambulatory Visit | Attending: Cardiovascular Disease | Admitting: Cardiovascular Disease

## 2014-01-20 ENCOUNTER — Encounter (HOSPITAL_COMMUNITY): Payer: Self-pay

## 2014-01-23 ENCOUNTER — Encounter (HOSPITAL_COMMUNITY)
Admission: RE | Admit: 2014-01-23 | Discharge: 2014-01-23 | Disposition: A | Discharge status: Home / Self Care | Payer: Self-pay | Source: Ambulatory Visit | Attending: Cardiovascular Disease | Admitting: Cardiovascular Disease

## 2014-01-24 DIAGNOSIS — I2510 Atherosclerotic heart disease of native coronary artery without angina pectoris: Secondary | ICD-10-CM | POA: Diagnosis not present

## 2014-01-24 DIAGNOSIS — I10 Essential (primary) hypertension: Secondary | ICD-10-CM | POA: Diagnosis not present

## 2014-01-24 DIAGNOSIS — I4891 Unspecified atrial fibrillation: Secondary | ICD-10-CM | POA: Diagnosis not present

## 2014-01-24 DIAGNOSIS — F39 Unspecified mood [affective] disorder: Secondary | ICD-10-CM | POA: Diagnosis not present

## 2014-01-24 DIAGNOSIS — I429 Cardiomyopathy, unspecified: Secondary | ICD-10-CM | POA: Diagnosis not present

## 2014-01-24 DIAGNOSIS — E780 Pure hypercholesterolemia: Secondary | ICD-10-CM | POA: Diagnosis not present

## 2014-01-25 ENCOUNTER — Encounter (HOSPITAL_COMMUNITY)
Admission: RE | Admit: 2014-01-25 | Discharge: 2014-01-25 | Disposition: A | Discharge status: Home / Self Care | Payer: Self-pay | Source: Ambulatory Visit | Attending: Cardiovascular Disease | Admitting: Cardiovascular Disease

## 2014-01-27 ENCOUNTER — Encounter (HOSPITAL_COMMUNITY)
Admission: RE | Admit: 2014-01-27 | Discharge: 2014-01-27 | Disposition: A | Discharge status: Home / Self Care | Payer: Self-pay | Source: Ambulatory Visit | Attending: Cardiovascular Disease | Admitting: Cardiovascular Disease

## 2014-01-30 ENCOUNTER — Encounter (HOSPITAL_COMMUNITY)
Admission: RE | Admit: 2014-01-30 | Discharge: 2014-01-30 | Disposition: A | Discharge status: Home / Self Care | Payer: Self-pay | Source: Ambulatory Visit | Attending: Cardiovascular Disease | Admitting: Cardiovascular Disease

## 2014-01-30 DIAGNOSIS — Z79899 Other long term (current) drug therapy: Secondary | ICD-10-CM | POA: Insufficient documentation

## 2014-01-30 DIAGNOSIS — Z888 Allergy status to other drugs, medicaments and biological substances status: Secondary | ICD-10-CM | POA: Insufficient documentation

## 2014-01-30 DIAGNOSIS — Z7901 Long term (current) use of anticoagulants: Secondary | ICD-10-CM | POA: Insufficient documentation

## 2014-01-30 DIAGNOSIS — Z87891 Personal history of nicotine dependence: Secondary | ICD-10-CM | POA: Insufficient documentation

## 2014-01-30 DIAGNOSIS — I5181 Takotsubo syndrome: Secondary | ICD-10-CM | POA: Insufficient documentation

## 2014-01-30 DIAGNOSIS — Z79891 Long term (current) use of opiate analgesic: Secondary | ICD-10-CM | POA: Insufficient documentation

## 2014-01-30 DIAGNOSIS — I480 Paroxysmal atrial fibrillation: Secondary | ICD-10-CM | POA: Insufficient documentation

## 2014-02-01 ENCOUNTER — Encounter (HOSPITAL_COMMUNITY): Payer: Self-pay

## 2014-02-03 ENCOUNTER — Encounter (HOSPITAL_COMMUNITY)
Admission: RE | Admit: 2014-02-03 | Discharge: 2014-02-03 | Disposition: A | Discharge status: Home / Self Care | Payer: Self-pay | Source: Ambulatory Visit | Attending: Cardiovascular Disease | Admitting: Cardiovascular Disease

## 2014-02-06 ENCOUNTER — Encounter (HOSPITAL_COMMUNITY)
Admission: RE | Admit: 2014-02-06 | Discharge: 2014-02-06 | Disposition: A | Discharge status: Home / Self Care | Payer: Self-pay | Source: Ambulatory Visit | Attending: Cardiovascular Disease | Admitting: Cardiovascular Disease

## 2014-02-08 ENCOUNTER — Encounter (HOSPITAL_COMMUNITY): Payer: Self-pay

## 2014-02-10 ENCOUNTER — Encounter (HOSPITAL_COMMUNITY)
Admission: RE | Admit: 2014-02-10 | Discharge: 2014-02-10 | Disposition: A | Discharge status: Home / Self Care | Payer: Self-pay | Source: Ambulatory Visit | Attending: Cardiovascular Disease | Admitting: Cardiovascular Disease

## 2014-02-13 ENCOUNTER — Telehealth: Payer: Self-pay | Admitting: Cardiovascular Disease

## 2014-02-13 ENCOUNTER — Encounter (HOSPITAL_COMMUNITY)
Admission: RE | Admit: 2014-02-13 | Discharge: 2014-02-13 | Disposition: A | Discharge status: Home / Self Care | Payer: Self-pay | Source: Ambulatory Visit | Attending: Cardiovascular Disease | Admitting: Cardiovascular Disease

## 2014-02-13 MED ORDER — APIXABAN 5 MG PO TABS: 5.0000 mg | ORAL_TABLET | Freq: Two times a day (BID) | ORAL | Status: DC

## 2014-02-13 NOTE — Telephone Encounter (Signed)
Pt need a new prescription for Eliquis #30. Please call to [facility] [phone]

## 2014-02-15 ENCOUNTER — Encounter (HOSPITAL_COMMUNITY)
Admission: RE | Admit: 2014-02-15 | Discharge: 2014-02-15 | Disposition: A | Discharge status: Home / Self Care | Payer: Self-pay | Source: Ambulatory Visit | Attending: Cardiovascular Disease | Admitting: Cardiovascular Disease

## 2014-02-17 ENCOUNTER — Encounter (HOSPITAL_COMMUNITY)
Admission: RE | Admit: 2014-02-17 | Discharge: 2014-02-17 | Disposition: A | Discharge status: Home / Self Care | Payer: Self-pay | Source: Ambulatory Visit | Attending: Cardiovascular Disease | Admitting: Cardiovascular Disease

## 2014-02-20 ENCOUNTER — Encounter (HOSPITAL_COMMUNITY)
Admission: RE | Admit: 2014-02-20 | Discharge: 2014-02-20 | Disposition: A | Discharge status: Home / Self Care | Payer: Self-pay | Source: Ambulatory Visit | Attending: Cardiovascular Disease | Admitting: Cardiovascular Disease

## 2014-02-22 ENCOUNTER — Encounter (HOSPITAL_COMMUNITY): Payer: Self-pay

## 2014-02-27 ENCOUNTER — Encounter (HOSPITAL_COMMUNITY)
Admission: RE | Admit: 2014-02-27 | Discharge: 2014-02-27 | Disposition: A | Discharge status: Home / Self Care | Payer: Self-pay | Source: Ambulatory Visit | Attending: Cardiovascular Disease | Admitting: Cardiovascular Disease

## 2014-03-01 ENCOUNTER — Encounter (HOSPITAL_COMMUNITY): Payer: Medicare Other

## 2014-03-01 DIAGNOSIS — Z87891 Personal history of nicotine dependence: Secondary | ICD-10-CM | POA: Insufficient documentation

## 2014-03-01 DIAGNOSIS — Z79899 Other long term (current) drug therapy: Secondary | ICD-10-CM | POA: Insufficient documentation

## 2014-03-01 DIAGNOSIS — I5181 Takotsubo syndrome: Secondary | ICD-10-CM | POA: Insufficient documentation

## 2014-03-01 DIAGNOSIS — Z79891 Long term (current) use of opiate analgesic: Secondary | ICD-10-CM | POA: Insufficient documentation

## 2014-03-01 DIAGNOSIS — Z888 Allergy status to other drugs, medicaments and biological substances status: Secondary | ICD-10-CM | POA: Insufficient documentation

## 2014-03-01 DIAGNOSIS — Z7901 Long term (current) use of anticoagulants: Secondary | ICD-10-CM | POA: Insufficient documentation

## 2014-03-01 DIAGNOSIS — I480 Paroxysmal atrial fibrillation: Secondary | ICD-10-CM | POA: Insufficient documentation

## 2014-03-03 ENCOUNTER — Encounter (HOSPITAL_COMMUNITY)
Admission: RE | Admit: 2014-03-03 | Discharge: 2014-03-03 | Disposition: A | Discharge status: Home / Self Care | Payer: Self-pay | Source: Ambulatory Visit | Attending: Cardiovascular Disease | Admitting: Cardiovascular Disease

## 2014-03-06 ENCOUNTER — Encounter (HOSPITAL_COMMUNITY)
Admission: RE | Admit: 2014-03-06 | Discharge: 2014-03-06 | Disposition: A | Discharge status: Home / Self Care | Payer: Self-pay | Source: Ambulatory Visit | Attending: Cardiovascular Disease | Admitting: Cardiovascular Disease

## 2014-03-08 ENCOUNTER — Encounter (HOSPITAL_COMMUNITY)
Admission: RE | Admit: 2014-03-08 | Discharge: 2014-03-08 | Disposition: A | Discharge status: Home / Self Care | Payer: Self-pay | Source: Ambulatory Visit | Attending: Cardiovascular Disease | Admitting: Cardiovascular Disease

## 2014-03-09 ENCOUNTER — Encounter (HOSPITAL_COMMUNITY): Payer: Self-pay | Admitting: Interventional Cardiology

## 2014-03-10 ENCOUNTER — Encounter (HOSPITAL_COMMUNITY)
Admission: RE | Admit: 2014-03-10 | Discharge: 2014-03-10 | Disposition: A | Discharge status: Home / Self Care | Payer: Self-pay | Source: Ambulatory Visit | Attending: Cardiovascular Disease | Admitting: Cardiovascular Disease

## 2014-03-13 ENCOUNTER — Encounter (HOSPITAL_COMMUNITY)
Admission: RE | Admit: 2014-03-13 | Discharge: 2014-03-13 | Disposition: A | Discharge status: Home / Self Care | Payer: Self-pay | Source: Ambulatory Visit | Attending: Cardiovascular Disease | Admitting: Cardiovascular Disease

## 2014-03-15 ENCOUNTER — Encounter (HOSPITAL_COMMUNITY)
Admission: RE | Admit: 2014-03-15 | Discharge: 2014-03-15 | Disposition: A | Discharge status: Home / Self Care | Payer: Self-pay | Source: Ambulatory Visit | Attending: Cardiovascular Disease | Admitting: Cardiovascular Disease

## 2014-03-15 DIAGNOSIS — M1712 Unilateral primary osteoarthritis, left knee: Secondary | ICD-10-CM | POA: Diagnosis not present

## 2014-03-15 DIAGNOSIS — M1711 Unilateral primary osteoarthritis, right knee: Secondary | ICD-10-CM | POA: Diagnosis not present

## 2014-03-17 ENCOUNTER — Encounter (HOSPITAL_COMMUNITY)
Admission: RE | Admit: 2014-03-17 | Discharge: 2014-03-17 | Disposition: A | Discharge status: Home / Self Care | Payer: Self-pay | Source: Ambulatory Visit | Attending: Cardiovascular Disease | Admitting: Cardiovascular Disease

## 2014-03-20 ENCOUNTER — Encounter (HOSPITAL_COMMUNITY): Payer: Self-pay

## 2014-03-22 ENCOUNTER — Encounter (HOSPITAL_COMMUNITY): Payer: Self-pay

## 2014-03-27 ENCOUNTER — Encounter (HOSPITAL_COMMUNITY): Payer: Self-pay

## 2014-03-27 ENCOUNTER — Other Ambulatory Visit: Payer: Self-pay

## 2014-03-27 DIAGNOSIS — Z1231 Encounter for screening mammogram for malignant neoplasm of breast: Secondary | ICD-10-CM

## 2014-03-29 ENCOUNTER — Encounter (HOSPITAL_COMMUNITY)
Admission: RE | Admit: 2014-03-29 | Discharge: 2014-03-29 | Disposition: A | Discharge status: Home / Self Care | Payer: Self-pay | Source: Ambulatory Visit | Attending: Cardiovascular Disease | Admitting: Cardiovascular Disease

## 2014-04-03 ENCOUNTER — Encounter (HOSPITAL_COMMUNITY): Payer: Medicare Other

## 2014-04-03 DIAGNOSIS — Z87891 Personal history of nicotine dependence: Secondary | ICD-10-CM | POA: Insufficient documentation

## 2014-04-03 DIAGNOSIS — I480 Paroxysmal atrial fibrillation: Secondary | ICD-10-CM | POA: Insufficient documentation

## 2014-04-03 DIAGNOSIS — I5181 Takotsubo syndrome: Secondary | ICD-10-CM | POA: Insufficient documentation

## 2014-04-03 DIAGNOSIS — Z79899 Other long term (current) drug therapy: Secondary | ICD-10-CM | POA: Insufficient documentation

## 2014-04-03 DIAGNOSIS — Z888 Allergy status to other drugs, medicaments and biological substances status: Secondary | ICD-10-CM | POA: Insufficient documentation

## 2014-04-03 DIAGNOSIS — Z79891 Long term (current) use of opiate analgesic: Secondary | ICD-10-CM | POA: Insufficient documentation

## 2014-04-03 DIAGNOSIS — Z7901 Long term (current) use of anticoagulants: Secondary | ICD-10-CM | POA: Insufficient documentation

## 2014-04-04 ENCOUNTER — Emergency Department (HOSPITAL_COMMUNITY)
Admission: EM | Admit: 2014-04-04 | Discharge: 2014-04-04 | Disposition: A | Discharge status: Home / Self Care | Payer: Medicare Other | Attending: Emergency Medicine | Admitting: Emergency Medicine

## 2014-04-04 ENCOUNTER — Emergency Department (HOSPITAL_COMMUNITY): Payer: Medicare Other

## 2014-04-04 ENCOUNTER — Encounter (HOSPITAL_COMMUNITY): Payer: Self-pay | Admitting: *Deleted

## 2014-04-04 DIAGNOSIS — E860 Dehydration: Secondary | ICD-10-CM | POA: Diagnosis not present

## 2014-04-04 DIAGNOSIS — E785 Hyperlipidemia, unspecified: Secondary | ICD-10-CM | POA: Insufficient documentation

## 2014-04-04 DIAGNOSIS — I10 Essential (primary) hypertension: Secondary | ICD-10-CM | POA: Diagnosis not present

## 2014-04-04 DIAGNOSIS — R55 Syncope and collapse: Secondary | ICD-10-CM | POA: Diagnosis not present

## 2014-04-04 DIAGNOSIS — Z8719 Personal history of other diseases of the digestive system: Secondary | ICD-10-CM | POA: Insufficient documentation

## 2014-04-04 DIAGNOSIS — J42 Unspecified chronic bronchitis: Secondary | ICD-10-CM | POA: Diagnosis not present

## 2014-04-04 DIAGNOSIS — Z79899 Other long term (current) drug therapy: Secondary | ICD-10-CM | POA: Diagnosis not present

## 2014-04-04 DIAGNOSIS — Z7902 Long term (current) use of antithrombotics/antiplatelets: Secondary | ICD-10-CM | POA: Diagnosis not present

## 2014-04-04 DIAGNOSIS — E876 Hypokalemia: Secondary | ICD-10-CM | POA: Diagnosis not present

## 2014-04-04 DIAGNOSIS — R404 Transient alteration of awareness: Secondary | ICD-10-CM | POA: Diagnosis not present

## 2014-04-04 DIAGNOSIS — Z87891 Personal history of nicotine dependence: Secondary | ICD-10-CM | POA: Insufficient documentation

## 2014-04-04 DIAGNOSIS — R531 Weakness: Secondary | ICD-10-CM | POA: Diagnosis not present

## 2014-04-04 DIAGNOSIS — E871 Hypo-osmolality and hyponatremia: Secondary | ICD-10-CM | POA: Diagnosis not present

## 2014-04-04 DIAGNOSIS — I252 Old myocardial infarction: Secondary | ICD-10-CM | POA: Insufficient documentation

## 2014-04-04 DIAGNOSIS — Z862 Personal history of diseases of the blood and blood-forming organs and certain disorders involving the immune mechanism: Secondary | ICD-10-CM | POA: Insufficient documentation

## 2014-04-04 DIAGNOSIS — Z853 Personal history of malignant neoplasm of breast: Secondary | ICD-10-CM | POA: Insufficient documentation

## 2014-04-04 DIAGNOSIS — I517 Cardiomegaly: Secondary | ICD-10-CM | POA: Diagnosis not present

## 2014-04-04 LAB — CBC WITH DIFFERENTIAL/PLATELET
BASOS PCT: 0 % (ref 0–1)
Basophils Absolute: 0 10*3/uL (ref 0.0–0.1)
EOS ABS: 0 10*3/uL (ref 0.0–0.7)
Eosinophils Relative: 0 % (ref 0–5)
HCT: 36.7 % (ref 36.0–46.0)
Hemoglobin: 12.2 g/dL (ref 12.0–15.0)
LYMPHS PCT: 9 % — AB (ref 12–46)
Lymphs Abs: 1 10*3/uL (ref 0.7–4.0)
MCH: 32.1 pg (ref 26.0–34.0)
MCHC: 33.2 g/dL (ref 30.0–36.0)
MCV: 96.6 fL (ref 78.0–100.0)
Monocytes Absolute: 0.7 10*3/uL (ref 0.1–1.0)
Monocytes Relative: 6 % (ref 3–12)
Neutro Abs: 9.7 10*3/uL — ABNORMAL HIGH (ref 1.7–7.7)
Neutrophils Relative %: 85 % — ABNORMAL HIGH (ref 43–77)
PLATELETS: 256 10*3/uL (ref 150–400)
RBC: 3.8 MIL/uL — ABNORMAL LOW (ref 3.87–5.11)
RDW: 12.7 % (ref 11.5–15.5)
WBC: 11.4 10*3/uL — AB (ref 4.0–10.5)

## 2014-04-04 LAB — COMPREHENSIVE METABOLIC PANEL
ALBUMIN: 3.5 g/dL (ref 3.5–5.2)
ALK PHOS: 73 U/L (ref 39–117)
ALT: 20 U/L (ref 0–35)
AST: 21 U/L (ref 0–37)
Anion gap: 11 (ref 5–15)
BUN: 7 mg/dL (ref 6–23)
CO2: 29 mmol/L (ref 19–32)
Calcium: 8.8 mg/dL (ref 8.4–10.5)
Chloride: 88 mEq/L — ABNORMAL LOW (ref 96–112)
Creatinine, Ser: 0.74 mg/dL (ref 0.50–1.10)
GFR calc Af Amer: >90 mL/min (ref >90)
GFR calc non Af Amer: 80 mL/min — ABNORMAL LOW (ref >90)
Glucose, Bld: 160 mg/dL — ABNORMAL HIGH (ref 70–99)
POTASSIUM: 2.8 mmol/L — AB (ref 3.5–5.1)
Sodium: 128 mmol/L — ABNORMAL LOW (ref 135–145)
TOTAL PROTEIN: 6.6 g/dL (ref 6.0–8.3)
Total Bilirubin: 0.6 mg/dL (ref 0.3–1.2)

## 2014-04-04 LAB — URINALYSIS, DIPSTICK ONLY
Bilirubin Urine: NEGATIVE
Glucose, UA: NEGATIVE mg/dL
KETONES UR: NEGATIVE mg/dL
Nitrite: NEGATIVE
PH: 7 (ref 5.0–8.0)
Protein, ur: NEGATIVE mg/dL
Specific Gravity, Urine: 1.007 (ref 1.005–1.030)
Urobilinogen, UA: 0.2 mg/dL (ref 0.0–1.0)

## 2014-04-04 LAB — TROPONIN I

## 2014-04-04 MED ORDER — POTASSIUM CHLORIDE CRYS ER 20 MEQ PO TBCR: 20.0000 meq | EXTENDED_RELEASE_TABLET | Freq: Every day | ORAL | Status: DC

## 2014-04-04 MED ORDER — SODIUM CHLORIDE 0.9 % IV BOLUS (SEPSIS)
1000.0000 mL | Freq: Once | INTRAVENOUS | Status: AC
  Administered 2014-04-04: 1000 mL via INTRAVENOUS

## 2014-04-04 MED ORDER — POTASSIUM CHLORIDE CRYS ER 20 MEQ PO TBCR
40.0000 meq | EXTENDED_RELEASE_TABLET | Freq: Once | ORAL | Status: AC
  Administered 2014-04-04: 40 meq via ORAL
  Filled 2014-04-04: qty 2

## 2014-04-04 NOTE — ED Notes (Signed)
Pt states she had a syncopal episode, thinks what she ate for lunch did not agree with her. States she has had syncopal episodes before, reports that she is "sensitive to things" and that she can have a syncopal episode if her stomach is not feeling well. Pt alert, oriented, NAD, respirations equal and unlabored, skin color and temp appropriate.

## 2014-04-04 NOTE — Discharge Instructions (Signed)
Follow up with your md in 2-3 days.   Drink plenty of fluids.

## 2014-04-04 NOTE — ED Notes (Signed)
Per EMS- pt reports feeling weak today. Pt recently had URI and stomach virus. Pt had an episode of diarhea today. Pt the had a syncopal episode lasting approx 2 minutes. Pt did not fall and was helped to ground by husband. Pt alert and oriented at this time. Pt states "i feel much better now"

## 2014-04-04 NOTE — ED Provider Notes (Signed)
CSN: [id number]     Arrival date & time 04/04/14  1524 History   First MD Initiated Contact with Patient 04/04/14 1531     Chief Complaint  Patient presents with  . Loss of Consciousness     (Consider location/radiation/quality/duration/timing/severity/associated sxs/prior Treatment) Patient is a 80 y.o. female presenting with syncope. The history is provided by the patient (the pt states she became dizzy and almost passed out.  she has been sick with cough and diarhea).  Loss of Consciousness Episode history:  Single Most recent episode:  Today Duration: never completely lost conciousness. Timing:  Rare Progression:  Resolved Chronicity:  New Context: not blood draw   Associated symptoms: no chest pain, no headaches and no seizures     Past Medical History  Diagnosis Date  . Hyperlipidemia   . Allergy   . Hypertension   . Cancer     bilateral breast  . Osteopenia   . GERD (gastroesophageal reflux disease)   . Macular degeneration     right eye  . Myocardial infarction   . Anemia 1984    before hysterectomy  . History of blood product transfusion 1984  . Takotsubo cardiomyopathy    Past Surgical History  Procedure Laterality Date  . Breast lumpectomy  04/19/04    left with sentinel node  . Abdominal hysterectomy  1984  . Breast biopsy  04/29/05    right   . Cataract extraction      bilateral  . Retinal laser procedure      right  . Appendectomy    . Eye surgery    . Left heart catheterization with coronary angiogram N/A 07/26/2013    Procedure: LEFT HEART CATHETERIZATION WITH CORONARY ANGIOGRAM;  Surgeon: Jettie Booze, MD;  Location: [facility] CATH LAB;  Service: Cardiovascular;  Laterality: N/A;   Family History  Problem Relation Age of Onset  . Dementia Mother   . Stroke Mother   . Heart disease Mother 53  . Cancer Father     Prostate  . Heart disease Father 52  . Cancer Maternal Aunt     Breast  . Cancer Maternal Grandmother   . Heart attack Maternal  Grandfather   . Heart attack Paternal Grandfather    History  Substance Use Topics  . Smoking status: Former Research scientist (life sciences)  . Smokeless tobacco: Never Used  . Alcohol Use: 1.8 oz/week    3 Shots of liquor per week     Comment: 2-3 drinks containing a shot each of whiskey per night   OB History    No data available     Review of Systems  Constitutional: Negative for appetite change and fatigue.  HENT: Negative for congestion, ear discharge and sinus pressure.   Eyes: Negative for discharge.  Respiratory: Negative for cough.   Cardiovascular: Positive for syncope. Negative for chest pain.  Gastrointestinal: Negative for abdominal pain and diarrhea.  Genitourinary: Negative for frequency and hematuria.  Musculoskeletal: Negative for back pain.  Skin: Negative for rash.  Neurological: Negative for seizures and headaches.  Psychiatric/Behavioral: Negative for hallucinations.      Allergies  Nsaids and Salicylates  Home Medications   Prior to Admission medications   Medication Sig Start Date End Date Taking? Authorizing Provider  acetaminophen (TYLENOL) 500 MG tablet Take 1,000 mg by mouth at bedtime as needed for mild pain.   Yes Historical Provider, MD  apixaban (ELIQUIS) 5 MG TABS tablet Take 1 tablet (5 mg total) by mouth 2 (two) times daily.  02/13/14  Yes Mihai Croitoru, MD  atorvastatin (LIPITOR) 20 MG tablet Take 20 mg by mouth at bedtime.    Yes Historical Provider, MD  dextromethorphan-guaiFENesin (MUCINEX DM) 30-600 MG per 12 hr tablet Take 1 tablet by mouth 2 (two) times daily.   Yes Historical Provider, MD  dorzolamide-timolol (COSOPT) 22.3-6.8 MG/ML ophthalmic solution Place 1 drop into both eyes 2 (two) times daily.  08/04/13  Yes Historical Provider, MD  fexofenadine (ALLEGRA) 180 MG tablet Take 180 mg by mouth daily.    Yes Historical Provider, MD  HYDROcodone-acetaminophen (NORCO) 5-325 MG per tablet Take 1 tablet by mouth daily.  01/09/11  Yes Historical Provider, MD   latanoprost (XALATAN) 0.005 % ophthalmic solution Place 1 drop into both eyes at bedtime.  06/17/13  Yes Historical Provider, MD  metoprolol succinate (TOPROL-XL) 25 MG 24 hr tablet Take 1 tablet (25 mg total) by mouth daily. 12/08/13  Yes Eileen Stanford, PA-C  olmesartan-hydrochlorothiazide (BENICAR HCT) 20-12.5 MG per tablet Take 1 tablet by mouth daily.   Yes Historical Provider, MD  sertraline (ZOLOFT) 25 MG tablet  12/07/13  Yes Historical Provider, MD  nitroGLYCERIN (NITROSTAT) 0.4 MG SL tablet Place 0.4 mg under the tongue every 5 (five) minutes as needed for chest pain. 07/27/13   Brett Canales, PA-C  potassium chloride SA (K-DUR,KLOR-CON) 20 MEQ tablet Take 1 tablet (20 mEq total) by mouth daily. 04/04/14   Maudry Diego, MD   BP 171/68 mmHg  Pulse 91  Temp(Src) 98 F (36.7 C) (Oral)  Resp 16  Ht 5\' 3"  (1.6 m)  Wt 181 lb (82.101 kg)  BMI 32.07 kg/m2  SpO2 95% Physical Exam  Constitutional: She is oriented to person, place, and time. She appears well-developed.  HENT:  Head: Normocephalic.  Eyes: Conjunctivae and EOM are normal. No scleral icterus.  Neck: Neck supple. No thyromegaly present.  Cardiovascular: Normal rate and regular rhythm.  Exam reveals no gallop and no friction rub.   No murmur heard. Pulmonary/Chest: No stridor. She has no wheezes. She has no rales. She exhibits no tenderness.  Abdominal: She exhibits no distension. There is no tenderness. There is no rebound.  Musculoskeletal: Normal range of motion. She exhibits no edema.  Lymphadenopathy:    She has no cervical adenopathy.  Neurological: She is oriented to person, place, and time. She exhibits normal muscle tone. Coordination normal.  Skin: No rash noted. No erythema.  Psychiatric: She has a normal mood and affect. Her behavior is normal.    ED Course  Procedures (including critical care time) Labs Review Labs Reviewed  CBC WITH DIFFERENTIAL - Abnormal; Notable for the following:    WBC 11.4 (*)     RBC 3.80 (*)    Neutrophils Relative % 85 (*)    Neutro Abs 9.7 (*)    Lymphocytes Relative 9 (*)    All other components within normal limits  COMPREHENSIVE METABOLIC PANEL - Abnormal; Notable for the following:    Sodium 128 (*)    Potassium 2.8 (*)    Chloride 88 (*)    Glucose, Bld 160 (*)    GFR calc non Af Amer 80 (*)    All other components within normal limits  URINALYSIS, DIPSTICK ONLY - Abnormal; Notable for the following:    Hgb urine dipstick SMALL (*)    Leukocytes, UA TRACE (*)    All other components within normal limits  TROPONIN I    Imaging Review Dg Chest 2 View  04/04/2014  CLINICAL DATA:  Cough and congestion for 6 days, intermittent diarrhea, syncope today, history MI, atrial fibrillation, hypertension, LEFT breast cancer  EXAM: CHEST  2 VIEW  COMPARISON:  12/08/2013  FINDINGS: Enlargement of cardiac silhouette.  Affect calcification aorta.  Mediastinal contours and pulmonary vascularity normal.  Chronic bronchitic changes without infiltrate, pleural effusion or pneumothorax.  No acute osseous findings.  IMPRESSION: Enlargement of cardiac silhouette.  Chronic bronchitic changes without acute infiltrate.   Electronically Signed   By: Lavonia Dana M.D.   On: 04/04/2014 16:50     EKG Interpretation   Date/Time:  Tuesday April 04 2014 15:33:52 EST Ventricular Rate:  63 PR Interval:  191 QRS Duration: 96 QT Interval:  432 QTC Calculation: 442 R Axis:   41 Text Interpretation:  Sinus rhythm Ventricular premature complex  Nonspecific T abnormalities, lateral leads Confirmed by Ily Denno  MD, Jazyiah Yiu  [phone]) on 04/04/2014 6:44:36 PM      MDM   Final diagnoses:  Weak  Hyponatremia  Hypokalemia  Dehydration    Viral syndrome,   Hyponatremia and hypokalemia.   Near syncope.   Pt given potassium and saline and will follow up in 2-3 days    Maudry Diego, MD 04/04/14 1850

## 2014-04-05 ENCOUNTER — Encounter (HOSPITAL_COMMUNITY): Payer: Self-pay

## 2014-04-07 ENCOUNTER — Encounter (HOSPITAL_COMMUNITY): Payer: Self-pay

## 2014-04-07 DIAGNOSIS — J45909 Unspecified asthma, uncomplicated: Secondary | ICD-10-CM | POA: Diagnosis not present

## 2014-04-07 DIAGNOSIS — R197 Diarrhea, unspecified: Secondary | ICD-10-CM | POA: Diagnosis not present

## 2014-04-10 ENCOUNTER — Encounter (HOSPITAL_COMMUNITY): Payer: Self-pay

## 2014-04-12 ENCOUNTER — Encounter (HOSPITAL_COMMUNITY): Payer: Self-pay

## 2014-04-13 DIAGNOSIS — E871 Hypo-osmolality and hyponatremia: Secondary | ICD-10-CM | POA: Diagnosis not present

## 2014-04-14 ENCOUNTER — Encounter (HOSPITAL_COMMUNITY): Payer: Self-pay

## 2014-04-17 ENCOUNTER — Encounter (HOSPITAL_COMMUNITY): Payer: Self-pay

## 2014-04-17 DIAGNOSIS — H35363 Drusen (degenerative) of macula, bilateral: Secondary | ICD-10-CM | POA: Diagnosis not present

## 2014-04-17 DIAGNOSIS — H3531 Nonexudative age-related macular degeneration: Secondary | ICD-10-CM | POA: Diagnosis not present

## 2014-04-19 ENCOUNTER — Encounter (HOSPITAL_COMMUNITY)
Admission: RE | Admit: 2014-04-19 | Discharge: 2014-04-19 | Disposition: A | Discharge status: Home / Self Care | Payer: Self-pay | Source: Ambulatory Visit | Attending: Cardiovascular Disease | Admitting: Cardiovascular Disease

## 2014-04-19 DIAGNOSIS — E871 Hypo-osmolality and hyponatremia: Secondary | ICD-10-CM | POA: Diagnosis not present

## 2014-04-21 ENCOUNTER — Ambulatory Visit: Payer: Medicare Other

## 2014-04-21 ENCOUNTER — Encounter (HOSPITAL_COMMUNITY): Payer: Self-pay

## 2014-04-24 ENCOUNTER — Encounter (HOSPITAL_COMMUNITY)
Admission: RE | Admit: 2014-04-24 | Discharge: 2014-04-24 | Disposition: A | Discharge status: Home / Self Care | Payer: Self-pay | Source: Ambulatory Visit | Attending: Cardiovascular Disease | Admitting: Cardiovascular Disease

## 2014-04-26 ENCOUNTER — Encounter (HOSPITAL_COMMUNITY)
Admission: RE | Admit: 2014-04-26 | Discharge: 2014-04-26 | Disposition: A | Discharge status: Home / Self Care | Payer: Self-pay | Source: Ambulatory Visit | Attending: Cardiovascular Disease | Admitting: Cardiovascular Disease

## 2014-04-28 ENCOUNTER — Encounter (HOSPITAL_COMMUNITY)
Admission: RE | Admit: 2014-04-28 | Discharge: 2014-04-28 | Disposition: A | Discharge status: Home / Self Care | Payer: Medicare Other | Source: Ambulatory Visit | Attending: Cardiovascular Disease | Admitting: Cardiovascular Disease

## 2014-04-28 ENCOUNTER — Ambulatory Visit
Admission: RE | Admit: 2014-04-28 | Discharge: 2014-04-28 | Disposition: A | Discharge status: Home / Self Care | Payer: Medicare Other | Source: Ambulatory Visit

## 2014-04-28 ENCOUNTER — Other Ambulatory Visit: Payer: Self-pay | Admitting: *Deleted

## 2014-04-28 ENCOUNTER — Other Ambulatory Visit: Payer: Self-pay

## 2014-04-28 DIAGNOSIS — Z1231 Encounter for screening mammogram for malignant neoplasm of breast: Secondary | ICD-10-CM | POA: Diagnosis not present

## 2014-04-28 MED ORDER — METOPROLOL SUCCINATE ER 25 MG PO TB24: 25.0000 mg | ORAL_TABLET | Freq: Every day | ORAL | Status: DC

## 2014-04-28 NOTE — Telephone Encounter (Signed)
Rx refill sent to patient pharmacy   

## 2014-05-01 ENCOUNTER — Encounter (HOSPITAL_COMMUNITY)
Admission: RE | Admit: 2014-05-01 | Discharge: 2014-05-01 | Disposition: A | Discharge status: Home / Self Care | Payer: Self-pay | Source: Ambulatory Visit | Attending: Cardiovascular Disease | Admitting: Cardiovascular Disease

## 2014-05-01 DIAGNOSIS — Z87891 Personal history of nicotine dependence: Secondary | ICD-10-CM | POA: Insufficient documentation

## 2014-05-01 DIAGNOSIS — I5181 Takotsubo syndrome: Secondary | ICD-10-CM | POA: Insufficient documentation

## 2014-05-01 DIAGNOSIS — Z79891 Long term (current) use of opiate analgesic: Secondary | ICD-10-CM | POA: Insufficient documentation

## 2014-05-01 DIAGNOSIS — Z888 Allergy status to other drugs, medicaments and biological substances status: Secondary | ICD-10-CM | POA: Insufficient documentation

## 2014-05-01 DIAGNOSIS — I480 Paroxysmal atrial fibrillation: Secondary | ICD-10-CM | POA: Insufficient documentation

## 2014-05-01 DIAGNOSIS — Z7901 Long term (current) use of anticoagulants: Secondary | ICD-10-CM | POA: Insufficient documentation

## 2014-05-01 DIAGNOSIS — Z79899 Other long term (current) drug therapy: Secondary | ICD-10-CM | POA: Insufficient documentation

## 2014-05-03 ENCOUNTER — Encounter (HOSPITAL_COMMUNITY): Payer: Self-pay

## 2014-05-05 ENCOUNTER — Encounter (HOSPITAL_COMMUNITY): Payer: Self-pay

## 2014-05-08 ENCOUNTER — Encounter (HOSPITAL_COMMUNITY)
Admission: RE | Admit: 2014-05-08 | Discharge: 2014-05-08 | Disposition: A | Discharge status: Home / Self Care | Payer: Self-pay | Source: Ambulatory Visit | Attending: Cardiovascular Disease | Admitting: Cardiovascular Disease

## 2014-05-10 ENCOUNTER — Telehealth: Payer: Self-pay | Admitting: Cardiovascular Disease

## 2014-05-10 ENCOUNTER — Encounter (HOSPITAL_COMMUNITY)
Admission: RE | Admit: 2014-05-10 | Discharge: 2014-05-10 | Disposition: A | Discharge status: Home / Self Care | Payer: Self-pay | Source: Ambulatory Visit | Attending: Cardiovascular Disease | Admitting: Cardiovascular Disease

## 2014-05-10 NOTE — Telephone Encounter (Signed)
Joann from cardiac rehab called stated pt was having intermittent chest pain, before rehab but not while in rehab , pt. Did not take ntg because of short duration , instructed joann to tell pt. To make an appt. And to take her ntg for relief of pain and to let us know if it didn't get better

## 2014-05-10 NOTE — Progress Notes (Signed)
During check out at cardiac rehab maintenance, pt reports episode of chest tightness and jaw discomfort walking into hospital this morning.  Pt reports pain resolved upon arrival to cardiac rehab.  Pt reports the pain resolved with slowing down her pace.  Pt denies pain with exercise at rehab and currently pain free. BP:  130/80, recheck 120/78.  HR-80 sinus rhythm.   Spoke to [facility], Dr. Johnnette Gourd triage nurse, who advised pt make appt for evaluation of pain.  First available appt Monday May 15, 2014 @10 :30 with Ignacia Bayley. Pt instructed on proper use of NTG and when to call 911. Pt verbalized understanding.

## 2014-05-12 ENCOUNTER — Encounter (HOSPITAL_COMMUNITY)
Admission: RE | Admit: 2014-05-12 | Discharge: 2014-05-12 | Disposition: A | Discharge status: Home / Self Care | Payer: Self-pay | Source: Ambulatory Visit | Attending: Cardiovascular Disease | Admitting: Cardiovascular Disease

## 2014-05-15 ENCOUNTER — Ambulatory Visit: Payer: Medicare Other | Admitting: Nurse Practitioner

## 2014-05-15 ENCOUNTER — Encounter (HOSPITAL_COMMUNITY): Payer: Self-pay

## 2014-05-17 ENCOUNTER — Encounter (HOSPITAL_COMMUNITY): Payer: Self-pay

## 2014-05-19 ENCOUNTER — Encounter (HOSPITAL_COMMUNITY)
Admission: RE | Admit: 2014-05-19 | Discharge: 2014-05-19 | Disposition: A | Discharge status: Home / Self Care | Payer: Self-pay | Source: Ambulatory Visit | Attending: Cardiovascular Disease | Admitting: Cardiovascular Disease

## 2014-05-22 ENCOUNTER — Encounter (HOSPITAL_COMMUNITY)
Admission: RE | Admit: 2014-05-22 | Discharge: 2014-05-22 | Disposition: A | Discharge status: Home / Self Care | Payer: Self-pay | Source: Ambulatory Visit | Attending: Cardiovascular Disease | Admitting: Cardiovascular Disease

## 2014-05-24 ENCOUNTER — Encounter (HOSPITAL_COMMUNITY): Payer: Self-pay

## 2014-05-26 ENCOUNTER — Encounter (HOSPITAL_COMMUNITY)
Admission: RE | Admit: 2014-05-26 | Discharge: 2014-05-26 | Disposition: A | Discharge status: Home / Self Care | Payer: Self-pay | Source: Ambulatory Visit | Attending: Cardiovascular Disease | Admitting: Cardiovascular Disease

## 2014-05-29 ENCOUNTER — Encounter (HOSPITAL_COMMUNITY)
Admission: RE | Admit: 2014-05-29 | Discharge: 2014-05-29 | Disposition: A | Discharge status: Home / Self Care | Payer: Self-pay | Source: Ambulatory Visit | Attending: Cardiovascular Disease | Admitting: Cardiovascular Disease

## 2014-05-29 DIAGNOSIS — H02403 Unspecified ptosis of bilateral eyelids: Secondary | ICD-10-CM | POA: Diagnosis not present

## 2014-05-29 DIAGNOSIS — H4011X1 Primary open-angle glaucoma, mild stage: Secondary | ICD-10-CM | POA: Diagnosis not present

## 2014-05-29 DIAGNOSIS — H35033 Hypertensive retinopathy, bilateral: Secondary | ICD-10-CM | POA: Diagnosis not present

## 2014-05-29 DIAGNOSIS — H409 Unspecified glaucoma: Secondary | ICD-10-CM | POA: Diagnosis not present

## 2014-05-31 ENCOUNTER — Encounter (HOSPITAL_COMMUNITY): Payer: Medicare Other

## 2014-05-31 DIAGNOSIS — I480 Paroxysmal atrial fibrillation: Secondary | ICD-10-CM | POA: Insufficient documentation

## 2014-05-31 DIAGNOSIS — Z79891 Long term (current) use of opiate analgesic: Secondary | ICD-10-CM | POA: Insufficient documentation

## 2014-05-31 DIAGNOSIS — I5181 Takotsubo syndrome: Secondary | ICD-10-CM | POA: Insufficient documentation

## 2014-05-31 DIAGNOSIS — Z87891 Personal history of nicotine dependence: Secondary | ICD-10-CM | POA: Insufficient documentation

## 2014-05-31 DIAGNOSIS — Z888 Allergy status to other drugs, medicaments and biological substances status: Secondary | ICD-10-CM | POA: Insufficient documentation

## 2014-05-31 DIAGNOSIS — Z79899 Other long term (current) drug therapy: Secondary | ICD-10-CM | POA: Insufficient documentation

## 2014-05-31 DIAGNOSIS — Z7901 Long term (current) use of anticoagulants: Secondary | ICD-10-CM | POA: Insufficient documentation

## 2014-06-02 ENCOUNTER — Encounter (HOSPITAL_COMMUNITY): Payer: Self-pay

## 2014-06-05 ENCOUNTER — Encounter (HOSPITAL_COMMUNITY)
Admission: RE | Admit: 2014-06-05 | Discharge: 2014-06-05 | Disposition: A | Discharge status: Home / Self Care | Payer: Self-pay | Source: Ambulatory Visit | Attending: Cardiovascular Disease | Admitting: Cardiovascular Disease

## 2014-06-06 ENCOUNTER — Encounter: Payer: Self-pay | Admitting: Cardiology

## 2014-06-06 ENCOUNTER — Ambulatory Visit (INDEPENDENT_AMBULATORY_CARE_PROVIDER_SITE_OTHER): Payer: Medicare Other | Admitting: Cardiology

## 2014-06-06 VITALS — BP 130/74 | HR 71 | Ht 63.0 in | Wt 189.3 lb

## 2014-06-06 DIAGNOSIS — R0789 Other chest pain: Secondary | ICD-10-CM | POA: Diagnosis not present

## 2014-06-06 DIAGNOSIS — I5181 Takotsubo syndrome: Secondary | ICD-10-CM

## 2014-06-06 DIAGNOSIS — I480 Paroxysmal atrial fibrillation: Secondary | ICD-10-CM

## 2014-06-06 NOTE — Assessment & Plan Note (Signed)
Sent from cardiac rehab after an episode of chest pain

## 2014-06-06 NOTE — Assessment & Plan Note (Signed)
EF 25% April 2015- 55-60% August 2015

## 2014-06-06 NOTE — Assessment & Plan Note (Signed)
At cath April 2015, otherwise normal coronaries

## 2014-06-06 NOTE — Patient Instructions (Signed)
Your physician recommends that you schedule a follow-up appointment in: 2 months with Dr. Croitoru. 

## 2014-06-06 NOTE — Assessment & Plan Note (Signed)
Melinda [name] VASC =2 . NSR today

## 2014-06-06 NOTE — Progress Notes (Signed)
06/06/2014 Melinda [name]   1934-05-31  [id number]  Primary Physician Osborne Casco, MD Primary Cardiologist: Dr Sallyanne Kuster  HPI:  80 y/o female with a history of a Takotsubo event April 2015. Cath showed a 70% OM1 narrowing. Her EF then was 25% but improved to 55-60% by August 2015. Last week she was late and rushing to her cardiac rehab session. She told then that she has had some chest discomfort and jaw discomfort. They referred her here for f/u/. In thew office today she now minimizes the event and says she has had no further problems. She has been exercising without problems.    Current Outpatient Prescriptions  Medication Sig Dispense Refill  . acetaminophen (TYLENOL) 500 MG tablet Take 1,000 mg by mouth at bedtime as needed for mild pain.    Marland Kitchen apixaban (ELIQUIS) 5 MG TABS tablet Take 1 tablet (5 mg total) by mouth 2 (two) times daily. 60 tablet 5  . atorvastatin (LIPITOR) 20 MG tablet Take 20 mg by mouth at bedtime.     Marland Kitchen BENICAR 20 MG tablet Take 1 tablet by mouth daily.    . dorzolamide-timolol (COSOPT) 22.3-6.8 MG/ML ophthalmic solution Place 1 drop into both eyes 2 (two) times daily.     . fexofenadine (ALLEGRA) 180 MG tablet Take 180 mg by mouth daily.     Marland Kitchen HYDROcodone-acetaminophen (NORCO) 5-325 MG per tablet Take 1 tablet by mouth daily.     Marland Kitchen latanoprost (XALATAN) 0.005 % ophthalmic solution Place 1 drop into both eyes at bedtime.     . metoprolol succinate (TOPROL-XL) 25 MG 24 hr tablet Take 1 tablet (25 mg total) by mouth daily. 90 tablet 2  . nitroGLYCERIN (NITROSTAT) 0.4 MG SL tablet Place 0.4 mg under the tongue every 5 (five) minutes as needed for chest pain.    Marland Kitchen sertraline (ZOLOFT) 25 MG tablet      No current facility-administered medications for this visit.    Allergies  Allergen Reactions  . Nsaids Other (See Comments)    Extreme hives and difficulty breathing. "Has had to go to the ER."  . Salicylates Other (See Comments)    Extreme hives  and difficulty breathing. "Has had to go to the ER."    History   Social History  . Marital Status: Married    Spouse Name: N/A  . Number of Children: N/A  . Years of Education: N/A   Occupational History  . Not on file.   Social History Main Topics  . Smoking status: Former Research scientist (life sciences)  . Smokeless tobacco: Never Used  . Alcohol Use: 1.8 oz/week    3 Shots of liquor per week     Comment: 2-3 drinks containing a shot each of whiskey per night  . Drug Use: No  . Sexual Activity: Not on file   Other Topics Concern  . Not on file   Social History Narrative     Review of Systems: She does snore but denies daytime fatigue or problems sleeping. General: negative for chills, fever, night sweats or weight changes.  Cardiovascular: negative for chest pain, dyspnea on exertion, edema, orthopnea, palpitations, paroxysmal nocturnal dyspnea or shortness of breath Dermatological: negative for rash Respiratory: negative for cough or wheezing Urologic: negative for hematuria Abdominal: negative for nausea, vomiting, diarrhea, bright red blood per rectum, melena, or hematemesis Neurologic: negative for visual changes, syncope, or dizziness All other systems reviewed and are otherwise negative except as noted above.   Blood pressure 130/74, pulse 71, height  5\' 3"  (1.6 m), weight 189 lb 4.8 oz (85.866 kg).  General appearance: alert, cooperative, no distress and moderately obese Neck: no carotid bruit and no JVD Lungs: clear to auscultation bilaterally Heart: regular rate and rhythm Extremities: @+ distal pulses  EKG NSR, no acute changes  ASSESSMENT AND PLAN:   Chest pain Sent from cardiac rehab after an episode of chest pain   CAD (coronary artery disease) - 75% ostial OM1 At cath April 2015, otherwise normal coronaries   Paroxysmal a-fib [place] VASC =2 . NSR today   Takotsubo cardiomyopathy EF 25% April 2015- 55-60% August 2015   PLAN - I made no changes today and ordered  no new tests. She is to see Dr Sallyanne Kuster in May.  [facility] KPA-C 06/06/2014 4:36 PM

## 2014-06-07 ENCOUNTER — Encounter (HOSPITAL_COMMUNITY)
Admission: RE | Admit: 2014-06-07 | Discharge: 2014-06-07 | Disposition: A | Discharge status: Home / Self Care | Payer: Self-pay | Source: Ambulatory Visit | Attending: Cardiovascular Disease | Admitting: Cardiovascular Disease

## 2014-06-09 ENCOUNTER — Encounter (HOSPITAL_COMMUNITY)
Admission: RE | Admit: 2014-06-09 | Discharge: 2014-06-09 | Disposition: A | Discharge status: Home / Self Care | Payer: Self-pay | Source: Ambulatory Visit | Attending: Cardiovascular Disease | Admitting: Cardiovascular Disease

## 2014-06-12 ENCOUNTER — Encounter (HOSPITAL_COMMUNITY)
Admission: RE | Admit: 2014-06-12 | Discharge: 2014-06-12 | Disposition: A | Discharge status: Home / Self Care | Payer: Self-pay | Source: Ambulatory Visit | Attending: Cardiovascular Disease | Admitting: Cardiovascular Disease

## 2014-06-14 ENCOUNTER — Encounter (HOSPITAL_COMMUNITY): Payer: Self-pay

## 2014-06-15 DIAGNOSIS — E871 Hypo-osmolality and hyponatremia: Secondary | ICD-10-CM | POA: Diagnosis not present

## 2014-06-16 ENCOUNTER — Encounter (HOSPITAL_COMMUNITY)
Admission: RE | Admit: 2014-06-16 | Discharge: 2014-06-16 | Disposition: A | Discharge status: Home / Self Care | Payer: Self-pay | Source: Ambulatory Visit | Attending: Cardiovascular Disease | Admitting: Cardiovascular Disease

## 2014-06-19 ENCOUNTER — Encounter (HOSPITAL_COMMUNITY)
Admission: RE | Admit: 2014-06-19 | Discharge: 2014-06-19 | Disposition: A | Discharge status: Home / Self Care | Payer: Self-pay | Source: Ambulatory Visit | Attending: Cardiovascular Disease | Admitting: Cardiovascular Disease

## 2014-06-21 ENCOUNTER — Encounter (HOSPITAL_COMMUNITY): Payer: Self-pay

## 2014-06-23 ENCOUNTER — Encounter (HOSPITAL_COMMUNITY)
Admission: RE | Admit: 2014-06-23 | Discharge: 2014-06-23 | Disposition: A | Discharge status: Home / Self Care | Payer: Self-pay | Source: Ambulatory Visit | Attending: Cardiovascular Disease | Admitting: Cardiovascular Disease

## 2014-06-26 ENCOUNTER — Encounter (HOSPITAL_COMMUNITY)
Admission: RE | Admit: 2014-06-26 | Discharge: 2014-06-26 | Disposition: A | Discharge status: Home / Self Care | Payer: Self-pay | Source: Ambulatory Visit | Attending: Cardiovascular Disease | Admitting: Cardiovascular Disease

## 2014-06-28 ENCOUNTER — Encounter (HOSPITAL_COMMUNITY)
Admission: RE | Admit: 2014-06-28 | Discharge: 2014-06-28 | Disposition: A | Discharge status: Home / Self Care | Payer: Self-pay | Source: Ambulatory Visit | Attending: Cardiovascular Disease | Admitting: Cardiovascular Disease

## 2014-06-30 ENCOUNTER — Encounter (HOSPITAL_COMMUNITY)
Admission: RE | Admit: 2014-06-30 | Discharge: 2014-06-30 | Disposition: A | Discharge status: Home / Self Care | Payer: Self-pay | Source: Ambulatory Visit | Attending: Cardiovascular Disease | Admitting: Cardiovascular Disease

## 2014-06-30 DIAGNOSIS — Z7901 Long term (current) use of anticoagulants: Secondary | ICD-10-CM | POA: Insufficient documentation

## 2014-06-30 DIAGNOSIS — Z87891 Personal history of nicotine dependence: Secondary | ICD-10-CM | POA: Insufficient documentation

## 2014-06-30 DIAGNOSIS — Z79899 Other long term (current) drug therapy: Secondary | ICD-10-CM | POA: Insufficient documentation

## 2014-06-30 DIAGNOSIS — I5181 Takotsubo syndrome: Secondary | ICD-10-CM | POA: Insufficient documentation

## 2014-06-30 DIAGNOSIS — Z79891 Long term (current) use of opiate analgesic: Secondary | ICD-10-CM | POA: Insufficient documentation

## 2014-06-30 DIAGNOSIS — Z888 Allergy status to other drugs, medicaments and biological substances status: Secondary | ICD-10-CM | POA: Insufficient documentation

## 2014-06-30 DIAGNOSIS — I480 Paroxysmal atrial fibrillation: Secondary | ICD-10-CM | POA: Insufficient documentation

## 2014-07-10 ENCOUNTER — Encounter (HOSPITAL_COMMUNITY)
Admission: RE | Admit: 2014-07-10 | Discharge: 2014-07-10 | Disposition: A | Discharge status: Home / Self Care | Payer: Self-pay | Source: Ambulatory Visit | Attending: Cardiovascular Disease | Admitting: Cardiovascular Disease

## 2014-07-14 ENCOUNTER — Encounter (HOSPITAL_COMMUNITY)
Admission: RE | Admit: 2014-07-14 | Discharge: 2014-07-14 | Disposition: A | Discharge status: Home / Self Care | Payer: Self-pay | Source: Ambulatory Visit | Attending: Cardiovascular Disease | Admitting: Cardiovascular Disease

## 2014-07-17 ENCOUNTER — Encounter (HOSPITAL_COMMUNITY)
Admission: RE | Admit: 2014-07-17 | Discharge: 2014-07-17 | Disposition: A | Discharge status: Home / Self Care | Payer: Self-pay | Source: Ambulatory Visit | Attending: Cardiovascular Disease | Admitting: Cardiovascular Disease

## 2014-07-18 ENCOUNTER — Telehealth: Payer: Self-pay | Admitting: Cardiovascular Disease

## 2014-07-18 DIAGNOSIS — Z79899 Other long term (current) drug therapy: Secondary | ICD-10-CM

## 2014-07-18 NOTE — Telephone Encounter (Signed)
Pt wants to go back on her Benicar.She was told by her family physician to check with Dr C.

## 2014-07-18 NOTE — Telephone Encounter (Signed)
Left message to call back 07/19/14 to speak any triage nurse

## 2014-07-19 ENCOUNTER — Encounter (HOSPITAL_COMMUNITY)
Admission: RE | Admit: 2014-07-19 | Discharge: 2014-07-19 | Disposition: A | Discharge status: Home / Self Care | Payer: Self-pay | Source: Ambulatory Visit | Attending: Cardiovascular Disease | Admitting: Cardiovascular Disease

## 2014-07-19 DIAGNOSIS — Z79899 Other long term (current) drug therapy: Secondary | ICD-10-CM | POA: Diagnosis not present

## 2014-07-19 NOTE — Telephone Encounter (Signed)
BMET ordered, patient informed - advised to present to [facility] for lab draw at her earliest convenience & we will contact when results read.

## 2014-07-19 NOTE — Telephone Encounter (Signed)
Spoke to patient. She reports that she had previously been on Benicar-HCTZ 20-12.5mg  This was discontinued d/t labwork and she was switched to Benicar only in January by PCP She reports for a while she's had some return in leg swelling - this varies from day to day but she states it's noticeable. No other symptoms reported.  Pt reports HR 60s-70s, BP 110-140/60-80  Informed her I would defer to her cardiologist for OK to resume.

## 2014-07-19 NOTE — Telephone Encounter (Signed)
Yes, hse had very low sodium and potassium levels. Can we please repeat a BMET? Then we'll see if we can restart HCTZ at a lower dose or do something else. Pamala Hurry, can you please remind me to review her BP when we get labs back, please.

## 2014-07-20 LAB — BASIC METABOLIC PANEL
BUN: 11 mg/dL (ref 6–23)
CO2: 28 meq/L (ref 19–32)
Calcium: 9.3 mg/dL (ref 8.4–10.5)
Chloride: 99 mEq/L (ref 96–112)
Creat: 0.5 mg/dL (ref 0.50–1.10)
GLUCOSE: 105 mg/dL — AB (ref 70–99)
Potassium: 4 mEq/L (ref 3.5–5.3)
Sodium: 137 mEq/L (ref 135–145)

## 2014-07-21 ENCOUNTER — Encounter (HOSPITAL_COMMUNITY)
Admission: RE | Admit: 2014-07-21 | Discharge: 2014-07-21 | Disposition: A | Discharge status: Home / Self Care | Payer: Self-pay | Source: Ambulatory Visit | Attending: Cardiovascular Disease | Admitting: Cardiovascular Disease

## 2014-07-24 ENCOUNTER — Telehealth: Payer: Self-pay | Admitting: *Deleted

## 2014-07-24 MED ORDER — HYDROCHLOROTHIAZIDE 12.5 MG PO CAPS: 12.5000 mg | ORAL_CAPSULE | Freq: Every day | ORAL | Status: DC

## 2014-07-24 MED ORDER — HYDROCHLOROTHIAZIDE 12.5 MG PO CAPS: 12.5000 mg | ORAL_CAPSULE | ORAL | Status: DC

## 2014-07-24 NOTE — Telephone Encounter (Signed)
-----   Message from Sanda Klein, MD sent at 07/21/2014  9:22 AM EDT ----- K is completely normal. Let's just start with HCTZ 12.5 mg 3 days a week (MWF) and see what happens with BP and swelling. Will take 2 weeks for full effect. Please call us in 2 weeks with BP recordings

## 2014-07-24 NOTE — Telephone Encounter (Signed)
BMET OK from 4/20.  Will defer to Dr. Sallyanne Kuster to advise if OK to resume Benicar/HCTZ 20-12.5mg 

## 2014-07-24 NOTE — Telephone Encounter (Signed)
Pt is calling in to get the results to the labs she had done on 4/21 and she would like to know if/when she can start back taking her Benicar . She says that whenever call back if she does not answer, the results can be given to whoever answers the phone.   Thanks

## 2014-07-24 NOTE — Telephone Encounter (Signed)
I would her to please get a separate Rx for HCTZ 12.5 mg to take only three days a week - say MWF. Will take 2 weeks for full BP impact. Please call back with BP levels in 2 weeks and check a BMET then

## 2014-07-24 NOTE — Telephone Encounter (Signed)
Lab results called and given to husband.  Will start HCTZ 12.5mg   3 times a week.  Will check BP's and call after 2 weeks.  Rx sent to [facility].  Husband voiced understanding.

## 2014-07-24 NOTE — Telephone Encounter (Signed)
Pt informed of instructions.

## 2014-07-26 ENCOUNTER — Encounter (HOSPITAL_COMMUNITY)
Admission: RE | Admit: 2014-07-26 | Discharge: 2014-07-26 | Disposition: A | Discharge status: Home / Self Care | Payer: Self-pay | Source: Ambulatory Visit | Attending: Cardiovascular Disease | Admitting: Cardiovascular Disease

## 2014-07-28 ENCOUNTER — Encounter (HOSPITAL_COMMUNITY)
Admission: RE | Admit: 2014-07-28 | Discharge: 2014-07-28 | Disposition: A | Discharge status: Home / Self Care | Payer: Self-pay | Source: Ambulatory Visit | Attending: Cardiovascular Disease | Admitting: Cardiovascular Disease

## 2014-07-31 ENCOUNTER — Encounter (HOSPITAL_COMMUNITY)
Admission: RE | Admit: 2014-07-31 | Discharge: 2014-07-31 | Disposition: A | Discharge status: Home / Self Care | Payer: Self-pay | Source: Ambulatory Visit | Attending: Cardiovascular Disease | Admitting: Cardiovascular Disease

## 2014-07-31 DIAGNOSIS — I480 Paroxysmal atrial fibrillation: Secondary | ICD-10-CM | POA: Insufficient documentation

## 2014-07-31 DIAGNOSIS — Z888 Allergy status to other drugs, medicaments and biological substances status: Secondary | ICD-10-CM | POA: Insufficient documentation

## 2014-07-31 DIAGNOSIS — I5181 Takotsubo syndrome: Secondary | ICD-10-CM | POA: Insufficient documentation

## 2014-07-31 DIAGNOSIS — Z7901 Long term (current) use of anticoagulants: Secondary | ICD-10-CM | POA: Insufficient documentation

## 2014-07-31 DIAGNOSIS — Z79899 Other long term (current) drug therapy: Secondary | ICD-10-CM | POA: Insufficient documentation

## 2014-07-31 DIAGNOSIS — Z79891 Long term (current) use of opiate analgesic: Secondary | ICD-10-CM | POA: Insufficient documentation

## 2014-07-31 DIAGNOSIS — Z87891 Personal history of nicotine dependence: Secondary | ICD-10-CM | POA: Insufficient documentation

## 2014-08-02 ENCOUNTER — Encounter (HOSPITAL_COMMUNITY): Payer: Self-pay

## 2014-08-03 DIAGNOSIS — Z853 Personal history of malignant neoplasm of breast: Secondary | ICD-10-CM | POA: Diagnosis not present

## 2014-08-03 DIAGNOSIS — I2510 Atherosclerotic heart disease of native coronary artery without angina pectoris: Secondary | ICD-10-CM | POA: Diagnosis not present

## 2014-08-03 DIAGNOSIS — E780 Pure hypercholesterolemia: Secondary | ICD-10-CM | POA: Diagnosis not present

## 2014-08-03 DIAGNOSIS — I4891 Unspecified atrial fibrillation: Secondary | ICD-10-CM | POA: Diagnosis not present

## 2014-08-03 DIAGNOSIS — J301 Allergic rhinitis due to pollen: Secondary | ICD-10-CM | POA: Diagnosis not present

## 2014-08-03 DIAGNOSIS — F39 Unspecified mood [affective] disorder: Secondary | ICD-10-CM | POA: Diagnosis not present

## 2014-08-03 DIAGNOSIS — Z1389 Encounter for screening for other disorder: Secondary | ICD-10-CM | POA: Diagnosis not present

## 2014-08-03 DIAGNOSIS — I10 Essential (primary) hypertension: Secondary | ICD-10-CM | POA: Diagnosis not present

## 2014-08-03 DIAGNOSIS — K219 Gastro-esophageal reflux disease without esophagitis: Secondary | ICD-10-CM | POA: Diagnosis not present

## 2014-08-03 DIAGNOSIS — I429 Cardiomyopathy, unspecified: Secondary | ICD-10-CM | POA: Diagnosis not present

## 2014-08-03 DIAGNOSIS — Z0000 Encounter for general adult medical examination without abnormal findings: Secondary | ICD-10-CM | POA: Diagnosis not present

## 2014-08-03 DIAGNOSIS — J45909 Unspecified asthma, uncomplicated: Secondary | ICD-10-CM | POA: Diagnosis not present

## 2014-08-04 ENCOUNTER — Encounter (HOSPITAL_COMMUNITY): Payer: Self-pay

## 2014-08-07 ENCOUNTER — Encounter (HOSPITAL_COMMUNITY): Payer: Self-pay

## 2014-08-09 ENCOUNTER — Encounter (HOSPITAL_COMMUNITY)
Admission: RE | Admit: 2014-08-09 | Discharge: 2014-08-09 | Disposition: A | Discharge status: Home / Self Care | Payer: Self-pay | Source: Ambulatory Visit | Attending: Cardiovascular Disease | Admitting: Cardiovascular Disease

## 2014-08-10 ENCOUNTER — Telehealth: Payer: Self-pay | Admitting: Cardiovascular Disease

## 2014-08-10 NOTE — Telephone Encounter (Signed)
Staff message sent to Dr. Sallyanne Kuster

## 2014-08-10 NOTE — Telephone Encounter (Signed)
Pt is calling in to report her BP levels for the past couple of weeks. She was started on HCTZ and was instructed to monitor her levels.

## 2014-08-11 ENCOUNTER — Encounter (HOSPITAL_COMMUNITY)
Admission: RE | Admit: 2014-08-11 | Discharge: 2014-08-11 | Disposition: A | Discharge status: Home / Self Care | Payer: Self-pay | Source: Ambulatory Visit | Attending: Cardiovascular Disease | Admitting: Cardiovascular Disease

## 2014-08-14 ENCOUNTER — Encounter (HOSPITAL_COMMUNITY)
Admission: RE | Admit: 2014-08-14 | Discharge: 2014-08-14 | Disposition: A | Discharge status: Home / Self Care | Payer: Self-pay | Source: Ambulatory Visit | Attending: Cardiovascular Disease | Admitting: Cardiovascular Disease

## 2014-08-16 ENCOUNTER — Encounter (HOSPITAL_COMMUNITY)
Admission: RE | Admit: 2014-08-16 | Discharge: 2014-08-16 | Disposition: A | Discharge status: Home / Self Care | Payer: Self-pay | Source: Ambulatory Visit | Attending: Cardiovascular Disease | Admitting: Cardiovascular Disease

## 2014-08-18 ENCOUNTER — Encounter (HOSPITAL_COMMUNITY)
Admission: RE | Admit: 2014-08-18 | Discharge: 2014-08-18 | Disposition: A | Discharge status: Home / Self Care | Payer: Self-pay | Source: Ambulatory Visit | Attending: Cardiovascular Disease | Admitting: Cardiovascular Disease

## 2014-08-21 ENCOUNTER — Encounter (HOSPITAL_COMMUNITY): Admission: RE | Admit: 2014-08-21 | Payer: Self-pay | Source: Ambulatory Visit

## 2014-08-21 DIAGNOSIS — M859 Disorder of bone density and structure, unspecified: Secondary | ICD-10-CM | POA: Diagnosis not present

## 2014-08-21 DIAGNOSIS — M8589 Other specified disorders of bone density and structure, multiple sites: Secondary | ICD-10-CM | POA: Diagnosis not present

## 2014-08-22 ENCOUNTER — Other Ambulatory Visit: Payer: Self-pay | Admitting: Cardiovascular Disease

## 2014-08-22 ENCOUNTER — Telehealth: Payer: Self-pay | Admitting: *Deleted

## 2014-08-22 NOTE — Telephone Encounter (Signed)
SIGNED ORDER TO CONTINUE PHASE II CARDIAC REHAB AT [facility]

## 2014-08-23 ENCOUNTER — Encounter (HOSPITAL_COMMUNITY)
Admission: RE | Admit: 2014-08-23 | Discharge: 2014-08-23 | Disposition: A | Discharge status: Home / Self Care | Payer: Self-pay | Source: Ambulatory Visit | Attending: Cardiovascular Disease | Admitting: Cardiovascular Disease

## 2014-08-23 ENCOUNTER — Other Ambulatory Visit: Payer: Self-pay | Admitting: Cardiovascular Disease

## 2014-08-23 NOTE — Telephone Encounter (Signed)
Rx has been sent to the pharmacy electronically. ° °

## 2014-08-25 ENCOUNTER — Encounter (HOSPITAL_COMMUNITY)
Admission: RE | Admit: 2014-08-25 | Discharge: 2014-08-25 | Disposition: A | Discharge status: Home / Self Care | Payer: Self-pay | Source: Ambulatory Visit | Attending: Cardiovascular Disease | Admitting: Cardiovascular Disease

## 2014-08-30 ENCOUNTER — Encounter (HOSPITAL_COMMUNITY): Payer: Self-pay

## 2014-08-30 DIAGNOSIS — Z79899 Other long term (current) drug therapy: Secondary | ICD-10-CM | POA: Insufficient documentation

## 2014-08-30 DIAGNOSIS — Z7901 Long term (current) use of anticoagulants: Secondary | ICD-10-CM | POA: Insufficient documentation

## 2014-08-30 DIAGNOSIS — I480 Paroxysmal atrial fibrillation: Secondary | ICD-10-CM | POA: Insufficient documentation

## 2014-08-30 DIAGNOSIS — Z87891 Personal history of nicotine dependence: Secondary | ICD-10-CM | POA: Insufficient documentation

## 2014-08-30 DIAGNOSIS — I5181 Takotsubo syndrome: Secondary | ICD-10-CM | POA: Insufficient documentation

## 2014-08-30 DIAGNOSIS — Z888 Allergy status to other drugs, medicaments and biological substances status: Secondary | ICD-10-CM | POA: Insufficient documentation

## 2014-08-30 DIAGNOSIS — Z79891 Long term (current) use of opiate analgesic: Secondary | ICD-10-CM | POA: Insufficient documentation

## 2014-09-01 ENCOUNTER — Encounter: Payer: Self-pay | Admitting: Cardiovascular Disease

## 2014-09-01 ENCOUNTER — Encounter (HOSPITAL_COMMUNITY)
Admission: RE | Admit: 2014-09-01 | Discharge: 2014-09-01 | Disposition: A | Discharge status: Home / Self Care | Payer: Self-pay | Source: Ambulatory Visit | Attending: Cardiovascular Disease | Admitting: Cardiovascular Disease

## 2014-09-01 ENCOUNTER — Ambulatory Visit (INDEPENDENT_AMBULATORY_CARE_PROVIDER_SITE_OTHER): Payer: Medicare Other | Admitting: Cardiovascular Disease

## 2014-09-01 VITALS — BP 152/76 | HR 75 | Resp 16 | Ht 63.0 in | Wt 188.0 lb

## 2014-09-01 DIAGNOSIS — E785 Hyperlipidemia, unspecified: Secondary | ICD-10-CM | POA: Diagnosis not present

## 2014-09-01 DIAGNOSIS — I480 Paroxysmal atrial fibrillation: Secondary | ICD-10-CM

## 2014-09-01 DIAGNOSIS — I2510 Atherosclerotic heart disease of native coronary artery without angina pectoris: Secondary | ICD-10-CM

## 2014-09-01 NOTE — Patient Instructions (Signed)
Dr Croitoru recommends that you schedule a follow-up appointment in 1 year. You will receive a reminder letter in the mail two months in advance. If you don't receive a letter, please call our office to schedule the follow-up appointment. 

## 2014-09-01 NOTE — Progress Notes (Signed)
Patient ID: Melinda [name], female   DOB: 11-09-1934, 80 y.o.   MRN: [id number]     Cardiology Office Note   Date:  09/03/2014   ID:  Melinda [name], Melinda [name] 1934-10-24, MRN [id number]  PCP:  Osborne Casco, MD  Cardiologist:   Sanda Klein, MD   Chief Complaint  Patient presents with  . Follow-up    Patient has no complaints      History of Present Illness: Melinda [name] is a 80 y.o. female who presents for follow-up for coronary artery disease, paroxysmal atrial fibrillation and a history of stress cardiomyopathy.  She had some problems with chest discomfort in March with pain radiating to her jaw, that occurred while she was rushing to avoid being late for an appointment in cardiac rehabilitation. Symptoms have not recurred since. She is known to have a 75% stenosis in the ostium of the first oblique marginal artery there was felt to be best treated medically. Cardiac catheterization (April 2015) did not show any other significant lesions. Cardiac catheterization was performed when she presented with stress cardiomyopathy with an ejection fraction of 25%, shown to be completely recovered by echo in August 2015, EF 55-60 percent.  Her blood pressure is slightly high today but at home when checked is much better. It is consistently normal when she goes to cardiac rehabilitation.    Past Medical History  Diagnosis Date  . Hyperlipidemia   . Allergy   . Hypertension   . Cancer     bilateral breast  . Osteopenia   . GERD (gastroesophageal reflux disease)   . Macular degeneration     right eye  . Myocardial infarction   . Anemia 1984    before hysterectomy  . History of blood product transfusion 1984  . Takotsubo cardiomyopathy     Past Surgical History  Procedure Laterality Date  . Breast lumpectomy  04/19/04    left with sentinel node  . Abdominal hysterectomy  1984  . Breast biopsy  04/29/05    right   . Cataract extraction      bilateral  .  Retinal laser procedure      right  . Appendectomy    . Eye surgery    . Left heart catheterization with coronary angiogram N/A 07/26/2013    Procedure: LEFT HEART CATHETERIZATION WITH CORONARY ANGIOGRAM;  Surgeon: Jettie Booze, MD;  Location: [facility] CATH LAB;  Service: Cardiovascular;  Laterality: N/A;     Current Outpatient Prescriptions  Medication Sig Dispense Refill  . acetaminophen (TYLENOL) 500 MG tablet Take 1,000 mg by mouth at bedtime as needed for mild pain.    Marland Kitchen atorvastatin (LIPITOR) 20 MG tablet Take 20 mg by mouth at bedtime.     . dorzolamide-timolol (COSOPT) 22.3-6.8 MG/ML ophthalmic solution Place 1 drop into both eyes 2 (two) times daily.     Marland Kitchen ELIQUIS 5 MG TABS tablet TAKE 1 TABLET (5 MG TOTAL) BY MOUTH 2 (TWO) TIMES DAILY. 60 tablet 3  . fexofenadine (ALLEGRA) 180 MG tablet Take 180 mg by mouth daily.     . hydrochlorothiazide (MICROZIDE) 12.5 MG capsule Take 1 capsule (12.5 mg total) by mouth daily. 36 capsule 3  . hydrochlorothiazide (MICROZIDE) 12.5 MG capsule Take 1 capsule (12.5 mg total) by mouth 3 (three) times a week. Take 1 pill every Monday, Wednesday, and Friday. 45 capsule 3  . HYDROcodone-acetaminophen (NORCO) 5-325 MG per tablet Take 1 tablet by mouth daily.     Marland Kitchen latanoprost (XALATAN) 0.005 %  ophthalmic solution Place 1 drop into both eyes at bedtime.     . metoprolol succinate (TOPROL-XL) 25 MG 24 hr tablet Take 1 tablet (25 mg total) by mouth daily. 90 tablet 2  . nitroGLYCERIN (NITROSTAT) 0.4 MG SL tablet Place 0.4 mg under the tongue every 5 (five) minutes as needed for chest pain.    Marland Kitchen sertraline (ZOLOFT) 25 MG tablet     . valsartan (DIOVAN) 160 MG tablet Take 160 mg by mouth daily.      No current facility-administered medications for this visit.    Allergies:   Nsaids and Salicylates    Social History:  The patient  reports that she has quit smoking. She has never used smokeless tobacco. She reports that she drinks about 1.8 oz of alcohol  per week. She reports that she does not use illicit drugs.   Family History:  The patient's family history includes Cancer in her father, maternal aunt, and maternal grandmother; Dementia in her mother; Heart attack in her maternal grandfather and paternal grandfather; Heart disease (age of onset: 83) in her father; Heart disease (age of onset: 5) in her mother; Stroke in her mother.    ROS:  Please see the history of present illness.    Otherwise, review of systems positive for none.   All other systems are reviewed and negative.    PHYSICAL EXAM: VS:  BP 152/76 mmHg  Pulse 75  Resp 16  Ht 5\' 3"  (1.6 m)  Wt 85.276 kg (188 lb)  BMI 33.31 kg/m2 , BMI Body mass index is 33.31 kg/(m^2).  General: Alert, oriented x3, no distress Head: no evidence of trauma, PERRL, EOMI, no exophtalmos or lid lag, no myxedema, no xanthelasma; normal ears, nose and oropharynx Neck: normal jugular venous pulsations and no hepatojugular reflux; brisk carotid pulses without delay and no carotid bruits Chest: clear to auscultation, no signs of consolidation by percussion or palpation, normal fremitus, symmetrical and full respiratory excursions Cardiovascular: normal position and quality of the apical impulse, regular rhythm, normal first and second heart sounds, no murmurs, rubs or gallops Abdomen: no tenderness or distention, no masses by palpation, no abnormal pulsatility or arterial bruits, normal bowel sounds, no hepatosplenomegaly Extremities: no clubbing, cyanosis or edema; 2+ radial, ulnar and brachial pulses bilaterally; 2+ right femoral, posterior tibial and dorsalis pedis pulses; 2+ left femoral, posterior tibial and dorsalis pedis pulses; no subclavian or femoral bruits Neurological: grossly nonfocal Psych: euthymic mood, full affect   EKG:  EKG is not ordered today.   Recent Labs: 12/08/2013: TSH 4.040 04/04/2014: ALT 20; Hemoglobin 12.2; Platelets 256 07/19/2014: BUN 11; Creatinine 0.50; Potassium  4.0; Sodium 137    Lipid Panel    Component Value Date/Time   CHOL 159 07/26/2013 0718   TRIG 86 07/26/2013 0718   HDL 89 07/26/2013 0718   CHOLHDL 1.8 07/26/2013 0718   VLDL 17 07/26/2013 0718   LDLCALC 53 07/26/2013 0718      Wt Readings from Last 3 Encounters:  09/01/14 85.276 kg (188 lb)  06/06/14 85.866 kg (189 lb 4.8 oz)  04/04/14 82.101 kg (181 lb)      Other studies Reviewed: Additional studies/ records that were reviewed today include: Notes from her visit with Kerin Ransom in March 2015 and notes from cardiac rehabilitation  ASSESSMENT AND PLAN:  It is possible that Mrs. Garfinkel's episode of chest discomfort in March represented an effort angina event, and this could be explained on the basis of her known CAD: oblique  marginal 1 stenosis. She has not had any recurrent symptoms since despite being physically active in cardiac rehabilitation. There does not appear to be any need to change her medical therapy or perform additional testing at this time. She is counseled regarding the symptoms of unstable angina, for which she should seek immediate medical attention. She is on appropriate statin therapy and her last LDL cholesterol level was excellent. She also has an outstanding HDL level. HTN is usually well controlled. The remaining risk factor that should be addressed his mild obesity and we discussed this.  Has not had neurological/embolic or bleeding complications related to atrial fibrillation and anticoagulation with Eliquis. Reviewed the short acting nature of Eliquis and how to handle this medication in case of bleeding complications or need for surgical procedures. The burden of atrial fibrillation appears to be low or at least her episodes are asymptomatic. CHADSVasc 45 (age, gender, vascular disease).   Current medicines are reviewed at length with the patient today.  The patient does not have concerns regarding medicines.  The following changes have been made:  no  change  Labs/ tests ordered today include:  No orders of the defined types were placed in this encounter.     Patient Instructions  Dr Sallyanne Kuster recommends that you schedule a follow-up appointment in 1 year. You will receive a reminder letter in the mail two months in advance. If you don't receive a letter, please call our office to schedule the follow-up appointment.     Mikael Spray, MD  09/03/2014 11:16 AM    Sanda Klein, MD, [facility] [phone] office [phone] pager

## 2014-09-03 ENCOUNTER — Encounter: Payer: Self-pay | Admitting: Cardiovascular Disease

## 2014-09-04 ENCOUNTER — Encounter (HOSPITAL_COMMUNITY)
Admission: RE | Admit: 2014-09-04 | Discharge: 2014-09-04 | Disposition: A | Discharge status: Home / Self Care | Payer: Self-pay | Source: Ambulatory Visit | Attending: Cardiovascular Disease | Admitting: Cardiovascular Disease

## 2014-09-06 ENCOUNTER — Encounter (HOSPITAL_COMMUNITY)
Admission: RE | Admit: 2014-09-06 | Discharge: 2014-09-06 | Disposition: A | Discharge status: Home / Self Care | Payer: Self-pay | Source: Ambulatory Visit | Attending: Cardiovascular Disease | Admitting: Cardiovascular Disease

## 2014-09-07 DIAGNOSIS — E559 Vitamin D deficiency, unspecified: Secondary | ICD-10-CM | POA: Diagnosis not present

## 2014-09-07 DIAGNOSIS — M8589 Other specified disorders of bone density and structure, multiple sites: Secondary | ICD-10-CM | POA: Diagnosis not present

## 2014-09-07 DIAGNOSIS — I10 Essential (primary) hypertension: Secondary | ICD-10-CM | POA: Diagnosis not present

## 2014-09-08 ENCOUNTER — Encounter (HOSPITAL_COMMUNITY)
Admission: RE | Admit: 2014-09-08 | Discharge: 2014-09-08 | Disposition: A | Discharge status: Home / Self Care | Payer: Self-pay | Source: Ambulatory Visit | Attending: Cardiovascular Disease | Admitting: Cardiovascular Disease

## 2014-09-11 ENCOUNTER — Encounter (HOSPITAL_COMMUNITY)
Admission: RE | Admit: 2014-09-11 | Discharge: 2014-09-11 | Disposition: A | Discharge status: Home / Self Care | Payer: Self-pay | Source: Ambulatory Visit | Attending: Cardiovascular Disease | Admitting: Cardiovascular Disease

## 2014-09-13 ENCOUNTER — Encounter (HOSPITAL_COMMUNITY): Payer: Self-pay

## 2014-09-15 ENCOUNTER — Encounter (HOSPITAL_COMMUNITY)
Admission: RE | Admit: 2014-09-15 | Discharge: 2014-09-15 | Disposition: A | Discharge status: Home / Self Care | Payer: Self-pay | Source: Ambulatory Visit | Attending: Cardiovascular Disease | Admitting: Cardiovascular Disease

## 2014-09-18 ENCOUNTER — Encounter (HOSPITAL_COMMUNITY)
Admission: RE | Admit: 2014-09-18 | Discharge: 2014-09-18 | Disposition: A | Discharge status: Home / Self Care | Payer: Self-pay | Source: Ambulatory Visit | Attending: Cardiovascular Disease | Admitting: Cardiovascular Disease

## 2014-09-19 DIAGNOSIS — M1711 Unilateral primary osteoarthritis, right knee: Secondary | ICD-10-CM | POA: Diagnosis not present

## 2014-09-19 DIAGNOSIS — M1712 Unilateral primary osteoarthritis, left knee: Secondary | ICD-10-CM | POA: Diagnosis not present

## 2014-09-20 ENCOUNTER — Encounter (HOSPITAL_COMMUNITY): Payer: Self-pay

## 2014-09-22 ENCOUNTER — Encounter (HOSPITAL_COMMUNITY)
Admission: RE | Admit: 2014-09-22 | Discharge: 2014-09-22 | Disposition: A | Discharge status: Home / Self Care | Payer: Self-pay | Source: Ambulatory Visit | Attending: Cardiovascular Disease | Admitting: Cardiovascular Disease

## 2014-09-25 ENCOUNTER — Encounter (HOSPITAL_COMMUNITY)
Admission: RE | Admit: 2014-09-25 | Discharge: 2014-09-25 | Disposition: A | Discharge status: Home / Self Care | Payer: Self-pay | Source: Ambulatory Visit | Attending: Cardiovascular Disease | Admitting: Cardiovascular Disease

## 2014-09-27 ENCOUNTER — Encounter (HOSPITAL_COMMUNITY)
Admission: RE | Admit: 2014-09-27 | Discharge: 2014-09-27 | Disposition: A | Discharge status: Home / Self Care | Payer: Self-pay | Source: Ambulatory Visit | Attending: Cardiovascular Disease | Admitting: Cardiovascular Disease

## 2014-09-29 ENCOUNTER — Encounter (HOSPITAL_COMMUNITY): Payer: Medicare Other

## 2014-09-29 DIAGNOSIS — Z87891 Personal history of nicotine dependence: Secondary | ICD-10-CM | POA: Insufficient documentation

## 2014-09-29 DIAGNOSIS — Z79891 Long term (current) use of opiate analgesic: Secondary | ICD-10-CM | POA: Insufficient documentation

## 2014-09-29 DIAGNOSIS — I5181 Takotsubo syndrome: Secondary | ICD-10-CM | POA: Insufficient documentation

## 2014-09-29 DIAGNOSIS — Z7901 Long term (current) use of anticoagulants: Secondary | ICD-10-CM | POA: Insufficient documentation

## 2014-09-29 DIAGNOSIS — I480 Paroxysmal atrial fibrillation: Secondary | ICD-10-CM | POA: Insufficient documentation

## 2014-09-29 DIAGNOSIS — Z79899 Other long term (current) drug therapy: Secondary | ICD-10-CM | POA: Insufficient documentation

## 2014-09-29 DIAGNOSIS — Z888 Allergy status to other drugs, medicaments and biological substances status: Secondary | ICD-10-CM | POA: Insufficient documentation

## 2014-10-04 ENCOUNTER — Encounter (HOSPITAL_COMMUNITY)
Admission: RE | Admit: 2014-10-04 | Discharge: 2014-10-04 | Disposition: A | Discharge status: Home / Self Care | Payer: Self-pay | Source: Ambulatory Visit | Attending: Cardiovascular Disease | Admitting: Cardiovascular Disease

## 2014-10-04 DIAGNOSIS — H4011X1 Primary open-angle glaucoma, mild stage: Secondary | ICD-10-CM | POA: Diagnosis not present

## 2014-10-06 ENCOUNTER — Encounter (HOSPITAL_COMMUNITY)
Admission: RE | Admit: 2014-10-06 | Discharge: 2014-10-06 | Disposition: A | Discharge status: Home / Self Care | Payer: Self-pay | Source: Ambulatory Visit | Attending: Cardiovascular Disease | Admitting: Cardiovascular Disease

## 2014-10-09 ENCOUNTER — Encounter (HOSPITAL_COMMUNITY)
Admission: RE | Admit: 2014-10-09 | Discharge: 2014-10-09 | Disposition: A | Discharge status: Home / Self Care | Payer: Self-pay | Source: Ambulatory Visit | Attending: Cardiovascular Disease | Admitting: Cardiovascular Disease

## 2014-10-11 ENCOUNTER — Encounter (HOSPITAL_COMMUNITY)
Admission: RE | Admit: 2014-10-11 | Discharge: 2014-10-11 | Disposition: A | Discharge status: Home / Self Care | Payer: Self-pay | Source: Ambulatory Visit | Attending: Cardiovascular Disease | Admitting: Cardiovascular Disease

## 2014-10-12 DIAGNOSIS — M1711 Unilateral primary osteoarthritis, right knee: Secondary | ICD-10-CM | POA: Diagnosis not present

## 2014-10-12 DIAGNOSIS — M1712 Unilateral primary osteoarthritis, left knee: Secondary | ICD-10-CM | POA: Diagnosis not present

## 2014-10-13 ENCOUNTER — Encounter (HOSPITAL_COMMUNITY)
Admission: RE | Admit: 2014-10-13 | Discharge: 2014-10-13 | Disposition: A | Discharge status: Home / Self Care | Payer: Self-pay | Source: Ambulatory Visit | Attending: Cardiovascular Disease | Admitting: Cardiovascular Disease

## 2014-10-16 ENCOUNTER — Encounter (HOSPITAL_COMMUNITY)
Admission: RE | Admit: 2014-10-16 | Discharge: 2014-10-16 | Disposition: A | Discharge status: Home / Self Care | Payer: Self-pay | Source: Ambulatory Visit | Attending: Cardiovascular Disease | Admitting: Cardiovascular Disease

## 2014-10-18 ENCOUNTER — Encounter (HOSPITAL_COMMUNITY): Payer: Self-pay

## 2014-10-20 ENCOUNTER — Encounter (HOSPITAL_COMMUNITY): Payer: Self-pay

## 2014-10-23 ENCOUNTER — Encounter (HOSPITAL_COMMUNITY): Payer: Self-pay

## 2014-10-25 ENCOUNTER — Encounter (HOSPITAL_COMMUNITY): Payer: Self-pay

## 2014-10-27 ENCOUNTER — Encounter (HOSPITAL_COMMUNITY): Payer: Self-pay

## 2014-10-30 ENCOUNTER — Encounter (HOSPITAL_COMMUNITY)
Admission: RE | Admit: 2014-10-30 | Discharge: 2014-10-30 | Disposition: A | Discharge status: Home / Self Care | Payer: Self-pay | Source: Ambulatory Visit | Attending: Cardiovascular Disease | Admitting: Cardiovascular Disease

## 2014-10-30 DIAGNOSIS — Z7901 Long term (current) use of anticoagulants: Secondary | ICD-10-CM | POA: Insufficient documentation

## 2014-10-30 DIAGNOSIS — I480 Paroxysmal atrial fibrillation: Secondary | ICD-10-CM | POA: Insufficient documentation

## 2014-10-30 DIAGNOSIS — Z888 Allergy status to other drugs, medicaments and biological substances status: Secondary | ICD-10-CM | POA: Insufficient documentation

## 2014-10-30 DIAGNOSIS — Z87891 Personal history of nicotine dependence: Secondary | ICD-10-CM | POA: Insufficient documentation

## 2014-10-30 DIAGNOSIS — Z79891 Long term (current) use of opiate analgesic: Secondary | ICD-10-CM | POA: Insufficient documentation

## 2014-10-30 DIAGNOSIS — I5181 Takotsubo syndrome: Secondary | ICD-10-CM | POA: Insufficient documentation

## 2014-10-30 DIAGNOSIS — Z79899 Other long term (current) drug therapy: Secondary | ICD-10-CM | POA: Insufficient documentation

## 2014-11-01 ENCOUNTER — Encounter (HOSPITAL_COMMUNITY): Payer: Self-pay

## 2014-11-03 ENCOUNTER — Encounter (HOSPITAL_COMMUNITY): Payer: Self-pay

## 2014-11-06 ENCOUNTER — Encounter (HOSPITAL_COMMUNITY)
Admission: RE | Admit: 2014-11-06 | Discharge: 2014-11-06 | Disposition: A | Discharge status: Home / Self Care | Payer: Self-pay | Source: Ambulatory Visit | Attending: Cardiovascular Disease | Admitting: Cardiovascular Disease

## 2014-11-08 ENCOUNTER — Encounter (HOSPITAL_COMMUNITY): Payer: Self-pay

## 2014-11-10 ENCOUNTER — Encounter (HOSPITAL_COMMUNITY)
Admission: RE | Admit: 2014-11-10 | Discharge: 2014-11-10 | Disposition: A | Discharge status: Home / Self Care | Payer: Self-pay | Source: Ambulatory Visit | Attending: Cardiovascular Disease | Admitting: Cardiovascular Disease

## 2014-11-13 ENCOUNTER — Encounter (HOSPITAL_COMMUNITY)
Admission: RE | Admit: 2014-11-13 | Discharge: 2014-11-13 | Disposition: A | Discharge status: Home / Self Care | Payer: Self-pay | Source: Ambulatory Visit | Attending: Cardiovascular Disease | Admitting: Cardiovascular Disease

## 2014-11-15 ENCOUNTER — Encounter (HOSPITAL_COMMUNITY)
Admission: RE | Admit: 2014-11-15 | Discharge: 2014-11-15 | Disposition: A | Discharge status: Home / Self Care | Payer: Self-pay | Source: Ambulatory Visit | Attending: Cardiovascular Disease | Admitting: Cardiovascular Disease

## 2014-11-17 ENCOUNTER — Encounter (HOSPITAL_COMMUNITY): Payer: Self-pay

## 2014-11-20 ENCOUNTER — Encounter (HOSPITAL_COMMUNITY)
Admission: RE | Admit: 2014-11-20 | Discharge: 2014-11-20 | Disposition: A | Discharge status: Home / Self Care | Payer: Self-pay | Source: Ambulatory Visit | Attending: Cardiovascular Disease | Admitting: Cardiovascular Disease

## 2014-11-22 ENCOUNTER — Encounter (HOSPITAL_COMMUNITY)
Admission: RE | Admit: 2014-11-22 | Discharge: 2014-11-22 | Disposition: A | Discharge status: Home / Self Care | Payer: Self-pay | Source: Ambulatory Visit | Attending: Cardiovascular Disease | Admitting: Cardiovascular Disease

## 2014-11-24 ENCOUNTER — Encounter (HOSPITAL_COMMUNITY): Payer: Self-pay

## 2014-11-27 ENCOUNTER — Encounter (HOSPITAL_COMMUNITY)
Admission: RE | Admit: 2014-11-27 | Discharge: 2014-11-27 | Disposition: A | Discharge status: Home / Self Care | Payer: Self-pay | Source: Ambulatory Visit | Attending: Cardiovascular Disease | Admitting: Cardiovascular Disease

## 2014-11-29 ENCOUNTER — Telehealth: Payer: Self-pay | Admitting: *Deleted

## 2014-11-29 ENCOUNTER — Encounter (HOSPITAL_COMMUNITY)
Admission: RE | Admit: 2014-11-29 | Discharge: 2014-11-29 | Disposition: A | Discharge status: Home / Self Care | Payer: Self-pay | Source: Ambulatory Visit | Attending: Cardiovascular Disease | Admitting: Cardiovascular Disease

## 2014-11-29 NOTE — Telephone Encounter (Signed)
Clearance to hold Eliquis 48 hours prior to colonoscopy and resume immediately if no polyps removed.

## 2014-12-01 ENCOUNTER — Encounter (HOSPITAL_COMMUNITY): Payer: Medicare Other

## 2014-12-01 DIAGNOSIS — I5181 Takotsubo syndrome: Secondary | ICD-10-CM | POA: Insufficient documentation

## 2014-12-01 DIAGNOSIS — Z7901 Long term (current) use of anticoagulants: Secondary | ICD-10-CM | POA: Insufficient documentation

## 2014-12-01 DIAGNOSIS — I480 Paroxysmal atrial fibrillation: Secondary | ICD-10-CM | POA: Insufficient documentation

## 2014-12-01 DIAGNOSIS — Z79891 Long term (current) use of opiate analgesic: Secondary | ICD-10-CM | POA: Insufficient documentation

## 2014-12-01 DIAGNOSIS — Z79899 Other long term (current) drug therapy: Secondary | ICD-10-CM | POA: Insufficient documentation

## 2014-12-01 DIAGNOSIS — Z888 Allergy status to other drugs, medicaments and biological substances status: Secondary | ICD-10-CM | POA: Insufficient documentation

## 2014-12-01 DIAGNOSIS — Z87891 Personal history of nicotine dependence: Secondary | ICD-10-CM | POA: Insufficient documentation

## 2014-12-06 ENCOUNTER — Encounter (HOSPITAL_COMMUNITY): Payer: Self-pay

## 2014-12-08 ENCOUNTER — Encounter (HOSPITAL_COMMUNITY)
Admission: RE | Admit: 2014-12-08 | Discharge: 2014-12-08 | Disposition: A | Discharge status: Home / Self Care | Payer: Self-pay | Source: Ambulatory Visit | Attending: Cardiovascular Disease | Admitting: Cardiovascular Disease

## 2014-12-11 ENCOUNTER — Encounter (HOSPITAL_COMMUNITY)
Admission: RE | Admit: 2014-12-11 | Discharge: 2014-12-11 | Disposition: A | Discharge status: Home / Self Care | Payer: Self-pay | Source: Ambulatory Visit | Attending: Cardiovascular Disease | Admitting: Cardiovascular Disease

## 2014-12-13 ENCOUNTER — Encounter (HOSPITAL_COMMUNITY): Payer: Self-pay

## 2014-12-15 ENCOUNTER — Encounter (HOSPITAL_COMMUNITY)
Admission: RE | Admit: 2014-12-15 | Discharge: 2014-12-15 | Disposition: A | Discharge status: Home / Self Care | Payer: Self-pay | Source: Ambulatory Visit | Attending: Cardiovascular Disease | Admitting: Cardiovascular Disease

## 2014-12-18 ENCOUNTER — Encounter (HOSPITAL_COMMUNITY)
Admission: RE | Admit: 2014-12-18 | Discharge: 2014-12-18 | Disposition: A | Discharge status: Home / Self Care | Payer: Self-pay | Source: Ambulatory Visit | Attending: Cardiovascular Disease | Admitting: Cardiovascular Disease

## 2014-12-20 ENCOUNTER — Encounter (HOSPITAL_COMMUNITY)
Admission: RE | Admit: 2014-12-20 | Discharge: 2014-12-20 | Disposition: A | Discharge status: Home / Self Care | Payer: Self-pay | Source: Ambulatory Visit | Attending: Cardiovascular Disease | Admitting: Cardiovascular Disease

## 2014-12-22 ENCOUNTER — Encounter (HOSPITAL_COMMUNITY)
Admission: RE | Admit: 2014-12-22 | Discharge: 2014-12-22 | Disposition: A | Discharge status: Home / Self Care | Payer: Self-pay | Source: Ambulatory Visit | Attending: Cardiovascular Disease | Admitting: Cardiovascular Disease

## 2014-12-25 ENCOUNTER — Encounter (HOSPITAL_COMMUNITY)
Admission: RE | Admit: 2014-12-25 | Discharge: 2014-12-25 | Disposition: A | Discharge status: Home / Self Care | Payer: Self-pay | Source: Ambulatory Visit | Attending: Cardiovascular Disease | Admitting: Cardiovascular Disease

## 2014-12-27 ENCOUNTER — Encounter (HOSPITAL_COMMUNITY)
Admission: RE | Admit: 2014-12-27 | Discharge: 2014-12-27 | Disposition: A | Discharge status: Home / Self Care | Payer: Self-pay | Source: Ambulatory Visit | Attending: Cardiovascular Disease | Admitting: Cardiovascular Disease

## 2014-12-29 ENCOUNTER — Encounter (HOSPITAL_COMMUNITY)
Admission: RE | Admit: 2014-12-29 | Discharge: 2014-12-29 | Disposition: A | Discharge status: Home / Self Care | Payer: Self-pay | Source: Ambulatory Visit | Attending: Cardiovascular Disease | Admitting: Cardiovascular Disease

## 2015-01-01 ENCOUNTER — Encounter (HOSPITAL_COMMUNITY)
Admission: RE | Admit: 2015-01-01 | Discharge: 2015-01-01 | Disposition: A | Discharge status: Home / Self Care | Payer: Self-pay | Source: Ambulatory Visit | Attending: Cardiovascular Disease | Admitting: Cardiovascular Disease

## 2015-01-01 DIAGNOSIS — Z79891 Long term (current) use of opiate analgesic: Secondary | ICD-10-CM | POA: Insufficient documentation

## 2015-01-01 DIAGNOSIS — Z888 Allergy status to other drugs, medicaments and biological substances status: Secondary | ICD-10-CM | POA: Insufficient documentation

## 2015-01-01 DIAGNOSIS — Z87891 Personal history of nicotine dependence: Secondary | ICD-10-CM | POA: Insufficient documentation

## 2015-01-01 DIAGNOSIS — Z7901 Long term (current) use of anticoagulants: Secondary | ICD-10-CM | POA: Insufficient documentation

## 2015-01-01 DIAGNOSIS — Z79899 Other long term (current) drug therapy: Secondary | ICD-10-CM | POA: Insufficient documentation

## 2015-01-01 DIAGNOSIS — I480 Paroxysmal atrial fibrillation: Secondary | ICD-10-CM | POA: Insufficient documentation

## 2015-01-01 DIAGNOSIS — I5181 Takotsubo syndrome: Secondary | ICD-10-CM | POA: Insufficient documentation

## 2015-01-03 ENCOUNTER — Encounter (HOSPITAL_COMMUNITY): Payer: Self-pay

## 2015-01-05 ENCOUNTER — Encounter (HOSPITAL_COMMUNITY): Payer: Self-pay

## 2015-01-05 ENCOUNTER — Other Ambulatory Visit: Payer: Self-pay | Admitting: Gastroenterology

## 2015-01-05 DIAGNOSIS — D126 Benign neoplasm of colon, unspecified: Secondary | ICD-10-CM | POA: Diagnosis not present

## 2015-01-05 DIAGNOSIS — K6289 Other specified diseases of anus and rectum: Secondary | ICD-10-CM | POA: Diagnosis not present

## 2015-01-05 DIAGNOSIS — K5730 Diverticulosis of large intestine without perforation or abscess without bleeding: Secondary | ICD-10-CM | POA: Diagnosis not present

## 2015-01-05 DIAGNOSIS — Z09 Encounter for follow-up examination after completed treatment for conditions other than malignant neoplasm: Secondary | ICD-10-CM | POA: Diagnosis not present

## 2015-01-05 DIAGNOSIS — D122 Benign neoplasm of ascending colon: Secondary | ICD-10-CM | POA: Diagnosis not present

## 2015-01-05 DIAGNOSIS — Z86010 Personal history of colonic polyps: Secondary | ICD-10-CM | POA: Diagnosis not present

## 2015-01-08 ENCOUNTER — Encounter (HOSPITAL_COMMUNITY)
Admission: RE | Admit: 2015-01-08 | Discharge: 2015-01-08 | Disposition: A | Discharge status: Home / Self Care | Payer: Self-pay | Source: Ambulatory Visit | Attending: Cardiovascular Disease | Admitting: Cardiovascular Disease

## 2015-01-10 ENCOUNTER — Encounter (HOSPITAL_COMMUNITY)
Admission: RE | Admit: 2015-01-10 | Discharge: 2015-01-10 | Disposition: A | Discharge status: Home / Self Care | Payer: Self-pay | Source: Ambulatory Visit | Attending: Cardiovascular Disease | Admitting: Cardiovascular Disease

## 2015-01-11 ENCOUNTER — Telehealth: Payer: Self-pay | Admitting: *Deleted

## 2015-01-11 NOTE — Telephone Encounter (Signed)
Signed order for the maintenance program with Cardiac Rehab faxed to [facility].

## 2015-01-12 ENCOUNTER — Encounter (HOSPITAL_COMMUNITY): Payer: Self-pay

## 2015-01-15 ENCOUNTER — Encounter (HOSPITAL_COMMUNITY)
Admission: RE | Admit: 2015-01-15 | Discharge: 2015-01-15 | Disposition: A | Discharge status: Home / Self Care | Payer: Self-pay | Source: Ambulatory Visit | Attending: Cardiovascular Disease | Admitting: Cardiovascular Disease

## 2015-01-17 ENCOUNTER — Encounter (HOSPITAL_COMMUNITY)
Admission: RE | Admit: 2015-01-17 | Discharge: 2015-01-17 | Disposition: A | Discharge status: Home / Self Care | Payer: Self-pay | Source: Ambulatory Visit | Attending: Cardiovascular Disease | Admitting: Cardiovascular Disease

## 2015-01-19 ENCOUNTER — Encounter (HOSPITAL_COMMUNITY)
Admission: RE | Admit: 2015-01-19 | Discharge: 2015-01-19 | Disposition: A | Discharge status: Home / Self Care | Payer: Self-pay | Source: Ambulatory Visit | Attending: Cardiovascular Disease | Admitting: Cardiovascular Disease

## 2015-01-22 ENCOUNTER — Encounter (HOSPITAL_COMMUNITY)
Admission: RE | Admit: 2015-01-22 | Discharge: 2015-01-22 | Disposition: A | Discharge status: Home / Self Care | Payer: Medicare Other | Source: Ambulatory Visit | Attending: Cardiovascular Disease | Admitting: Cardiovascular Disease

## 2015-01-24 ENCOUNTER — Encounter (HOSPITAL_COMMUNITY): Payer: Self-pay

## 2015-01-26 ENCOUNTER — Encounter (HOSPITAL_COMMUNITY): Payer: Self-pay

## 2015-01-29 ENCOUNTER — Encounter (HOSPITAL_COMMUNITY): Payer: Self-pay

## 2015-01-31 ENCOUNTER — Encounter (HOSPITAL_COMMUNITY): Payer: Self-pay

## 2015-01-31 DIAGNOSIS — I252 Old myocardial infarction: Secondary | ICD-10-CM | POA: Insufficient documentation

## 2015-02-01 ENCOUNTER — Telehealth: Payer: Self-pay | Admitting: Cardiovascular Disease

## 2015-02-01 MED ORDER — METOPROLOL SUCCINATE ER 25 MG PO TB24: 25.0000 mg | ORAL_TABLET | Freq: Every day | ORAL | Status: DC

## 2015-02-01 NOTE — Telephone Encounter (Signed)
*  STAT* If patient is at the pharmacy, call can be transferred to refill team.   1. Which medications need to be refilled? (please list name of each medication and dose if known) Metoprolol Succ 25 mg--1 daily 2. Which pharmacy/location (including street and city if local pharmacy) is medication to be sent to?[facility]*  3. Do they need a 30 day or 90 day supply? 90 day supply with 3 refills

## 2015-02-01 NOTE — Addendum Note (Signed)
Addended by: Derl Barrow on: 02/01/2015 01:12 PM   Modules accepted: Orders

## 2015-02-02 ENCOUNTER — Encounter (HOSPITAL_COMMUNITY): Payer: Self-pay

## 2015-02-05 ENCOUNTER — Encounter (HOSPITAL_COMMUNITY)
Admission: RE | Admit: 2015-02-05 | Discharge: 2015-02-05 | Disposition: A | Discharge status: Home / Self Care | Payer: Self-pay | Source: Ambulatory Visit | Attending: Cardiovascular Disease | Admitting: Cardiovascular Disease

## 2015-02-05 DIAGNOSIS — I2510 Atherosclerotic heart disease of native coronary artery without angina pectoris: Secondary | ICD-10-CM | POA: Diagnosis not present

## 2015-02-05 DIAGNOSIS — I10 Essential (primary) hypertension: Secondary | ICD-10-CM | POA: Diagnosis not present

## 2015-02-05 DIAGNOSIS — I429 Cardiomyopathy, unspecified: Secondary | ICD-10-CM | POA: Diagnosis not present

## 2015-02-05 DIAGNOSIS — Z23 Encounter for immunization: Secondary | ICD-10-CM | POA: Diagnosis not present

## 2015-02-05 DIAGNOSIS — E7800 Pure hypercholesterolemia, unspecified: Secondary | ICD-10-CM | POA: Diagnosis not present

## 2015-02-05 DIAGNOSIS — K2970 Gastritis, unspecified, without bleeding: Secondary | ICD-10-CM | POA: Diagnosis not present

## 2015-02-07 ENCOUNTER — Encounter (HOSPITAL_COMMUNITY): Payer: Self-pay

## 2015-02-09 ENCOUNTER — Encounter (HOSPITAL_COMMUNITY)
Admission: RE | Admit: 2015-02-09 | Discharge: 2015-02-09 | Disposition: A | Discharge status: Home / Self Care | Payer: Self-pay | Source: Ambulatory Visit | Attending: Cardiovascular Disease | Admitting: Cardiovascular Disease

## 2015-02-12 ENCOUNTER — Encounter (HOSPITAL_COMMUNITY)
Admission: RE | Admit: 2015-02-12 | Discharge: 2015-02-12 | Disposition: A | Discharge status: Home / Self Care | Payer: Self-pay | Source: Ambulatory Visit | Attending: Cardiovascular Disease | Admitting: Cardiovascular Disease

## 2015-02-14 ENCOUNTER — Encounter (HOSPITAL_COMMUNITY): Payer: Self-pay

## 2015-02-16 ENCOUNTER — Encounter (HOSPITAL_COMMUNITY)
Admission: RE | Admit: 2015-02-16 | Discharge: 2015-02-16 | Disposition: A | Discharge status: Home / Self Care | Payer: Self-pay | Source: Ambulatory Visit | Attending: Cardiovascular Disease | Admitting: Cardiovascular Disease

## 2015-02-19 ENCOUNTER — Encounter (HOSPITAL_COMMUNITY)
Admission: RE | Admit: 2015-02-19 | Discharge: 2015-02-19 | Disposition: A | Discharge status: Home / Self Care | Payer: Self-pay | Source: Ambulatory Visit | Attending: Cardiovascular Disease | Admitting: Cardiovascular Disease

## 2015-02-21 ENCOUNTER — Encounter (HOSPITAL_COMMUNITY): Payer: Self-pay

## 2015-02-26 ENCOUNTER — Encounter (HOSPITAL_COMMUNITY)
Admission: RE | Admit: 2015-02-26 | Discharge: 2015-02-26 | Disposition: A | Discharge status: Home / Self Care | Payer: Self-pay | Source: Ambulatory Visit | Attending: Cardiovascular Disease | Admitting: Cardiovascular Disease

## 2015-02-28 ENCOUNTER — Encounter (HOSPITAL_COMMUNITY): Payer: Self-pay

## 2015-03-02 ENCOUNTER — Encounter (HOSPITAL_COMMUNITY)
Admission: RE | Admit: 2015-03-02 | Discharge: 2015-03-02 | Disposition: A | Discharge status: Home / Self Care | Payer: Self-pay | Source: Ambulatory Visit | Attending: Cardiovascular Disease | Admitting: Cardiovascular Disease

## 2015-03-02 DIAGNOSIS — I252 Old myocardial infarction: Secondary | ICD-10-CM | POA: Insufficient documentation

## 2015-03-05 ENCOUNTER — Encounter (HOSPITAL_COMMUNITY)
Admission: RE | Admit: 2015-03-05 | Discharge: 2015-03-05 | Disposition: A | Discharge status: Home / Self Care | Payer: Self-pay | Source: Ambulatory Visit | Attending: Cardiovascular Disease | Admitting: Cardiovascular Disease

## 2015-03-07 ENCOUNTER — Encounter (HOSPITAL_COMMUNITY): Payer: Self-pay

## 2015-03-09 ENCOUNTER — Encounter (HOSPITAL_COMMUNITY)
Admission: RE | Admit: 2015-03-09 | Discharge: 2015-03-09 | Disposition: A | Discharge status: Home / Self Care | Payer: Self-pay | Source: Ambulatory Visit | Attending: Cardiovascular Disease | Admitting: Cardiovascular Disease

## 2015-03-12 ENCOUNTER — Encounter (HOSPITAL_COMMUNITY)
Admission: RE | Admit: 2015-03-12 | Discharge: 2015-03-12 | Disposition: A | Discharge status: Home / Self Care | Payer: Self-pay | Source: Ambulatory Visit | Attending: Cardiovascular Disease | Admitting: Cardiovascular Disease

## 2015-03-14 ENCOUNTER — Encounter (HOSPITAL_COMMUNITY): Payer: Self-pay

## 2015-03-16 ENCOUNTER — Encounter (HOSPITAL_COMMUNITY)
Admission: RE | Admit: 2015-03-16 | Discharge: 2015-03-16 | Disposition: A | Discharge status: Home / Self Care | Payer: Self-pay | Source: Ambulatory Visit | Attending: Cardiovascular Disease | Admitting: Cardiovascular Disease

## 2015-03-19 ENCOUNTER — Encounter (HOSPITAL_COMMUNITY)
Admission: RE | Admit: 2015-03-19 | Discharge: 2015-03-19 | Disposition: A | Discharge status: Home / Self Care | Payer: Self-pay | Source: Ambulatory Visit | Attending: Cardiovascular Disease | Admitting: Cardiovascular Disease

## 2015-03-21 ENCOUNTER — Encounter (HOSPITAL_COMMUNITY): Payer: Self-pay

## 2015-03-23 ENCOUNTER — Encounter (HOSPITAL_COMMUNITY): Payer: Self-pay

## 2015-03-28 ENCOUNTER — Encounter (HOSPITAL_COMMUNITY)
Admission: RE | Admit: 2015-03-28 | Discharge: 2015-03-28 | Disposition: A | Discharge status: Home / Self Care | Payer: Self-pay | Source: Ambulatory Visit | Attending: Cardiovascular Disease | Admitting: Cardiovascular Disease

## 2015-03-29 ENCOUNTER — Other Ambulatory Visit: Payer: Self-pay | Admitting: Cardiovascular Disease

## 2015-03-30 ENCOUNTER — Encounter (HOSPITAL_COMMUNITY)
Admission: RE | Admit: 2015-03-30 | Discharge: 2015-03-30 | Disposition: A | Discharge status: Home / Self Care | Payer: Self-pay | Source: Ambulatory Visit | Attending: Cardiovascular Disease | Admitting: Cardiovascular Disease

## 2015-03-30 NOTE — Telephone Encounter (Signed)
Rx request sent to pharmacy.  

## 2015-04-04 ENCOUNTER — Encounter (HOSPITAL_COMMUNITY): Payer: Medicare Other

## 2015-04-04 DIAGNOSIS — I252 Old myocardial infarction: Secondary | ICD-10-CM | POA: Insufficient documentation

## 2015-04-05 ENCOUNTER — Other Ambulatory Visit: Payer: Self-pay

## 2015-04-05 DIAGNOSIS — Z1231 Encounter for screening mammogram for malignant neoplasm of breast: Secondary | ICD-10-CM

## 2015-04-06 ENCOUNTER — Encounter (HOSPITAL_COMMUNITY): Payer: Self-pay

## 2015-04-09 ENCOUNTER — Encounter (HOSPITAL_COMMUNITY): Payer: Self-pay

## 2015-04-09 DIAGNOSIS — K219 Gastro-esophageal reflux disease without esophagitis: Secondary | ICD-10-CM | POA: Diagnosis not present

## 2015-04-09 DIAGNOSIS — E559 Vitamin D deficiency, unspecified: Secondary | ICD-10-CM | POA: Diagnosis not present

## 2015-04-11 ENCOUNTER — Encounter (HOSPITAL_COMMUNITY)
Admission: RE | Admit: 2015-04-11 | Discharge: 2015-04-11 | Disposition: A | Discharge status: Home / Self Care | Payer: Self-pay | Source: Ambulatory Visit | Attending: Cardiovascular Disease | Admitting: Cardiovascular Disease

## 2015-04-13 ENCOUNTER — Encounter (HOSPITAL_COMMUNITY)
Admission: RE | Admit: 2015-04-13 | Discharge: 2015-04-13 | Disposition: A | Discharge status: Home / Self Care | Payer: Self-pay | Source: Ambulatory Visit | Attending: Cardiovascular Disease | Admitting: Cardiovascular Disease

## 2015-04-16 ENCOUNTER — Encounter (HOSPITAL_COMMUNITY)
Admission: RE | Admit: 2015-04-16 | Discharge: 2015-04-16 | Disposition: A | Discharge status: Home / Self Care | Payer: Self-pay | Source: Ambulatory Visit | Attending: Cardiovascular Disease | Admitting: Cardiovascular Disease

## 2015-04-18 ENCOUNTER — Encounter (HOSPITAL_COMMUNITY)
Admission: RE | Admit: 2015-04-18 | Discharge: 2015-04-18 | Disposition: A | Discharge status: Home / Self Care | Payer: Self-pay | Source: Ambulatory Visit | Attending: Cardiovascular Disease | Admitting: Cardiovascular Disease

## 2015-04-20 ENCOUNTER — Encounter (HOSPITAL_COMMUNITY): Payer: Self-pay

## 2015-04-23 ENCOUNTER — Encounter (HOSPITAL_COMMUNITY)
Admission: RE | Admit: 2015-04-23 | Discharge: 2015-04-23 | Disposition: A | Discharge status: Home / Self Care | Payer: Self-pay | Source: Ambulatory Visit | Attending: Cardiovascular Disease | Admitting: Cardiovascular Disease

## 2015-04-23 DIAGNOSIS — H353132 Nonexudative age-related macular degeneration, bilateral, intermediate dry stage: Secondary | ICD-10-CM | POA: Diagnosis not present

## 2015-04-23 DIAGNOSIS — H35363 Drusen (degenerative) of macula, bilateral: Secondary | ICD-10-CM | POA: Diagnosis not present

## 2015-04-23 DIAGNOSIS — H35362 Drusen (degenerative) of macula, left eye: Secondary | ICD-10-CM | POA: Diagnosis not present

## 2015-04-23 DIAGNOSIS — H353212 Exudative age-related macular degeneration, right eye, with inactive choroidal neovascularization: Secondary | ICD-10-CM | POA: Diagnosis not present

## 2015-04-25 ENCOUNTER — Encounter (HOSPITAL_COMMUNITY)
Admission: RE | Admit: 2015-04-25 | Discharge: 2015-04-25 | Disposition: A | Discharge status: Home / Self Care | Payer: Self-pay | Source: Ambulatory Visit | Attending: Cardiovascular Disease | Admitting: Cardiovascular Disease

## 2015-04-27 ENCOUNTER — Encounter (HOSPITAL_COMMUNITY): Payer: Self-pay

## 2015-04-30 ENCOUNTER — Encounter (HOSPITAL_COMMUNITY)
Admission: RE | Admit: 2015-04-30 | Discharge: 2015-04-30 | Disposition: A | Discharge status: Home / Self Care | Payer: Self-pay | Source: Ambulatory Visit | Attending: Cardiovascular Disease | Admitting: Cardiovascular Disease

## 2015-05-01 ENCOUNTER — Ambulatory Visit
Admission: RE | Admit: 2015-05-01 | Discharge: 2015-05-01 | Disposition: A | Discharge status: Home / Self Care | Payer: Medicare Other | Source: Ambulatory Visit

## 2015-05-01 DIAGNOSIS — Z1231 Encounter for screening mammogram for malignant neoplasm of breast: Secondary | ICD-10-CM

## 2015-05-02 ENCOUNTER — Encounter (HOSPITAL_COMMUNITY): Payer: Medicare Other

## 2015-05-02 DIAGNOSIS — I252 Old myocardial infarction: Secondary | ICD-10-CM | POA: Insufficient documentation

## 2015-05-04 ENCOUNTER — Encounter (HOSPITAL_COMMUNITY): Payer: Self-pay

## 2015-05-07 ENCOUNTER — Encounter (HOSPITAL_COMMUNITY)
Admission: RE | Admit: 2015-05-07 | Discharge: 2015-05-07 | Disposition: A | Discharge status: Home / Self Care | Payer: Self-pay | Source: Ambulatory Visit | Attending: Cardiovascular Disease | Admitting: Cardiovascular Disease

## 2015-05-07 DIAGNOSIS — M1711 Unilateral primary osteoarthritis, right knee: Secondary | ICD-10-CM | POA: Diagnosis not present

## 2015-05-09 ENCOUNTER — Encounter (HOSPITAL_COMMUNITY)
Admission: RE | Admit: 2015-05-09 | Discharge: 2015-05-09 | Disposition: A | Discharge status: Home / Self Care | Payer: Self-pay | Source: Ambulatory Visit | Attending: Cardiovascular Disease | Admitting: Cardiovascular Disease

## 2015-05-11 ENCOUNTER — Encounter (HOSPITAL_COMMUNITY)
Admission: RE | Admit: 2015-05-11 | Discharge: 2015-05-11 | Disposition: A | Discharge status: Home / Self Care | Payer: Self-pay | Source: Ambulatory Visit | Attending: Cardiovascular Disease | Admitting: Cardiovascular Disease

## 2015-05-14 ENCOUNTER — Encounter (HOSPITAL_COMMUNITY)
Admission: RE | Admit: 2015-05-14 | Discharge: 2015-05-14 | Disposition: A | Discharge status: Home / Self Care | Payer: Self-pay | Source: Ambulatory Visit | Attending: Cardiovascular Disease | Admitting: Cardiovascular Disease

## 2015-05-14 DIAGNOSIS — M1712 Unilateral primary osteoarthritis, left knee: Secondary | ICD-10-CM | POA: Diagnosis not present

## 2015-05-14 DIAGNOSIS — M1711 Unilateral primary osteoarthritis, right knee: Secondary | ICD-10-CM | POA: Diagnosis not present

## 2015-05-16 ENCOUNTER — Encounter (HOSPITAL_COMMUNITY): Payer: Self-pay

## 2015-05-18 ENCOUNTER — Encounter (HOSPITAL_COMMUNITY)
Admission: RE | Admit: 2015-05-18 | Discharge: 2015-05-18 | Disposition: A | Discharge status: Home / Self Care | Payer: Self-pay | Source: Ambulatory Visit | Attending: Cardiovascular Disease | Admitting: Cardiovascular Disease

## 2015-05-21 ENCOUNTER — Encounter (HOSPITAL_COMMUNITY)
Admission: RE | Admit: 2015-05-21 | Discharge: 2015-05-21 | Disposition: A | Discharge status: Home / Self Care | Payer: Self-pay | Source: Ambulatory Visit | Attending: Cardiovascular Disease | Admitting: Cardiovascular Disease

## 2015-05-22 DIAGNOSIS — M1711 Unilateral primary osteoarthritis, right knee: Secondary | ICD-10-CM | POA: Diagnosis not present

## 2015-05-22 DIAGNOSIS — M1712 Unilateral primary osteoarthritis, left knee: Secondary | ICD-10-CM | POA: Diagnosis not present

## 2015-05-23 ENCOUNTER — Encounter (HOSPITAL_COMMUNITY): Payer: Self-pay

## 2015-05-25 ENCOUNTER — Encounter (HOSPITAL_COMMUNITY)
Admission: RE | Admit: 2015-05-25 | Discharge: 2015-05-25 | Disposition: A | Discharge status: Home / Self Care | Payer: Self-pay | Source: Ambulatory Visit | Attending: Cardiovascular Disease | Admitting: Cardiovascular Disease

## 2015-05-28 ENCOUNTER — Encounter (HOSPITAL_COMMUNITY)
Admission: RE | Admit: 2015-05-28 | Discharge: 2015-05-28 | Disposition: A | Discharge status: Home / Self Care | Payer: Self-pay | Source: Ambulatory Visit | Attending: Cardiovascular Disease | Admitting: Cardiovascular Disease

## 2015-05-29 DIAGNOSIS — M1711 Unilateral primary osteoarthritis, right knee: Secondary | ICD-10-CM | POA: Diagnosis not present

## 2015-05-29 DIAGNOSIS — M1712 Unilateral primary osteoarthritis, left knee: Secondary | ICD-10-CM | POA: Diagnosis not present

## 2015-05-30 ENCOUNTER — Encounter (HOSPITAL_COMMUNITY): Payer: Self-pay

## 2015-05-30 DIAGNOSIS — I252 Old myocardial infarction: Secondary | ICD-10-CM | POA: Insufficient documentation

## 2015-06-01 ENCOUNTER — Encounter (HOSPITAL_COMMUNITY): Payer: Self-pay

## 2015-06-04 ENCOUNTER — Encounter (HOSPITAL_COMMUNITY)
Admission: RE | Admit: 2015-06-04 | Discharge: 2015-06-04 | Disposition: A | Discharge status: Home / Self Care | Payer: Self-pay | Source: Ambulatory Visit | Attending: Cardiovascular Disease | Admitting: Cardiovascular Disease

## 2015-06-04 DIAGNOSIS — H353212 Exudative age-related macular degeneration, right eye, with inactive choroidal neovascularization: Secondary | ICD-10-CM | POA: Diagnosis not present

## 2015-06-04 DIAGNOSIS — H26491 Other secondary cataract, right eye: Secondary | ICD-10-CM | POA: Diagnosis not present

## 2015-06-04 DIAGNOSIS — H401121 Primary open-angle glaucoma, left eye, mild stage: Secondary | ICD-10-CM | POA: Diagnosis not present

## 2015-06-04 DIAGNOSIS — H353122 Nonexudative age-related macular degeneration, left eye, intermediate dry stage: Secondary | ICD-10-CM | POA: Diagnosis not present

## 2015-06-04 DIAGNOSIS — H401111 Primary open-angle glaucoma, right eye, mild stage: Secondary | ICD-10-CM | POA: Diagnosis not present

## 2015-06-04 DIAGNOSIS — H35031 Hypertensive retinopathy, right eye: Secondary | ICD-10-CM | POA: Diagnosis not present

## 2015-06-05 DIAGNOSIS — M1712 Unilateral primary osteoarthritis, left knee: Secondary | ICD-10-CM | POA: Diagnosis not present

## 2015-06-05 DIAGNOSIS — M1711 Unilateral primary osteoarthritis, right knee: Secondary | ICD-10-CM | POA: Diagnosis not present

## 2015-06-06 ENCOUNTER — Encounter (HOSPITAL_COMMUNITY)
Admission: RE | Admit: 2015-06-06 | Discharge: 2015-06-06 | Disposition: A | Discharge status: Home / Self Care | Payer: Self-pay | Source: Ambulatory Visit | Attending: Cardiovascular Disease | Admitting: Cardiovascular Disease

## 2015-06-08 ENCOUNTER — Encounter (HOSPITAL_COMMUNITY)
Admission: RE | Admit: 2015-06-08 | Discharge: 2015-06-08 | Disposition: A | Discharge status: Home / Self Care | Payer: Self-pay | Source: Ambulatory Visit | Attending: Cardiovascular Disease | Admitting: Cardiovascular Disease

## 2015-06-11 ENCOUNTER — Encounter (HOSPITAL_COMMUNITY)
Admission: RE | Admit: 2015-06-11 | Discharge: 2015-06-11 | Disposition: A | Discharge status: Home / Self Care | Payer: Self-pay | Source: Ambulatory Visit | Attending: Cardiovascular Disease | Admitting: Cardiovascular Disease

## 2015-06-12 DIAGNOSIS — M1712 Unilateral primary osteoarthritis, left knee: Secondary | ICD-10-CM | POA: Diagnosis not present

## 2015-06-12 DIAGNOSIS — M1711 Unilateral primary osteoarthritis, right knee: Secondary | ICD-10-CM | POA: Diagnosis not present

## 2015-06-13 ENCOUNTER — Encounter (HOSPITAL_COMMUNITY): Payer: Self-pay

## 2015-06-15 ENCOUNTER — Encounter (HOSPITAL_COMMUNITY)
Admission: RE | Admit: 2015-06-15 | Discharge: 2015-06-15 | Disposition: A | Discharge status: Home / Self Care | Payer: Self-pay | Source: Ambulatory Visit | Attending: Cardiovascular Disease | Admitting: Cardiovascular Disease

## 2015-06-18 ENCOUNTER — Encounter (HOSPITAL_COMMUNITY)
Admission: RE | Admit: 2015-06-18 | Discharge: 2015-06-18 | Disposition: A | Discharge status: Home / Self Care | Payer: Self-pay | Source: Ambulatory Visit | Attending: Cardiovascular Disease | Admitting: Cardiovascular Disease

## 2015-06-20 ENCOUNTER — Encounter (HOSPITAL_COMMUNITY): Payer: Self-pay

## 2015-06-22 ENCOUNTER — Encounter (HOSPITAL_COMMUNITY)
Admission: RE | Admit: 2015-06-22 | Discharge: 2015-06-22 | Disposition: A | Discharge status: Home / Self Care | Payer: Self-pay | Source: Ambulatory Visit | Attending: Cardiovascular Disease | Admitting: Cardiovascular Disease

## 2015-06-25 ENCOUNTER — Encounter (HOSPITAL_COMMUNITY)
Admission: RE | Admit: 2015-06-25 | Discharge: 2015-06-25 | Disposition: A | Discharge status: Home / Self Care | Payer: Self-pay | Source: Ambulatory Visit | Attending: Cardiovascular Disease | Admitting: Cardiovascular Disease

## 2015-06-27 ENCOUNTER — Encounter (HOSPITAL_COMMUNITY)
Admission: RE | Admit: 2015-06-27 | Discharge: 2015-06-27 | Disposition: A | Discharge status: Home / Self Care | Payer: Self-pay | Source: Ambulatory Visit | Attending: Cardiovascular Disease | Admitting: Cardiovascular Disease

## 2015-06-29 ENCOUNTER — Encounter (HOSPITAL_COMMUNITY): Payer: Self-pay

## 2015-07-02 ENCOUNTER — Encounter (HOSPITAL_COMMUNITY)
Admission: RE | Admit: 2015-07-02 | Discharge: 2015-07-02 | Disposition: A | Discharge status: Home / Self Care | Payer: Self-pay | Source: Ambulatory Visit | Attending: Cardiovascular Disease | Admitting: Cardiovascular Disease

## 2015-07-02 DIAGNOSIS — I252 Old myocardial infarction: Secondary | ICD-10-CM | POA: Insufficient documentation

## 2015-07-04 ENCOUNTER — Encounter (HOSPITAL_COMMUNITY): Payer: Self-pay

## 2015-07-06 ENCOUNTER — Encounter (HOSPITAL_COMMUNITY): Payer: Self-pay

## 2015-07-09 ENCOUNTER — Encounter (HOSPITAL_COMMUNITY): Payer: Self-pay

## 2015-07-09 DIAGNOSIS — H019 Unspecified inflammation of eyelid: Secondary | ICD-10-CM | POA: Diagnosis not present

## 2015-07-11 ENCOUNTER — Encounter (HOSPITAL_COMMUNITY)
Admission: RE | Admit: 2015-07-11 | Discharge: 2015-07-11 | Disposition: A | Discharge status: Home / Self Care | Payer: Self-pay | Source: Ambulatory Visit | Attending: Cardiovascular Disease | Admitting: Cardiovascular Disease

## 2015-07-13 ENCOUNTER — Encounter (HOSPITAL_COMMUNITY)
Admission: RE | Admit: 2015-07-13 | Discharge: 2015-07-13 | Disposition: A | Discharge status: Home / Self Care | Payer: Self-pay | Source: Ambulatory Visit | Attending: Cardiovascular Disease | Admitting: Cardiovascular Disease

## 2015-07-16 ENCOUNTER — Encounter (HOSPITAL_COMMUNITY)
Admission: RE | Admit: 2015-07-16 | Discharge: 2015-07-16 | Disposition: A | Discharge status: Home / Self Care | Payer: Self-pay | Source: Ambulatory Visit | Attending: Cardiovascular Disease | Admitting: Cardiovascular Disease

## 2015-07-18 ENCOUNTER — Encounter (HOSPITAL_COMMUNITY): Payer: Self-pay

## 2015-07-20 ENCOUNTER — Encounter (HOSPITAL_COMMUNITY)
Admission: RE | Admit: 2015-07-20 | Discharge: 2015-07-20 | Disposition: A | Discharge status: Home / Self Care | Payer: Self-pay | Source: Ambulatory Visit | Attending: Cardiovascular Disease | Admitting: Cardiovascular Disease

## 2015-07-23 ENCOUNTER — Encounter (HOSPITAL_COMMUNITY)
Admission: RE | Admit: 2015-07-23 | Discharge: 2015-07-23 | Disposition: A | Discharge status: Home / Self Care | Payer: Self-pay | Source: Ambulatory Visit | Attending: Cardiovascular Disease | Admitting: Cardiovascular Disease

## 2015-07-25 ENCOUNTER — Encounter (HOSPITAL_COMMUNITY)
Admission: RE | Admit: 2015-07-25 | Discharge: 2015-07-25 | Disposition: A | Discharge status: Home / Self Care | Payer: Self-pay | Source: Ambulatory Visit | Attending: Cardiovascular Disease | Admitting: Cardiovascular Disease

## 2015-07-27 ENCOUNTER — Encounter (HOSPITAL_COMMUNITY): Payer: Self-pay

## 2015-07-30 ENCOUNTER — Encounter (HOSPITAL_COMMUNITY)
Admission: RE | Admit: 2015-07-30 | Discharge: 2015-07-30 | Disposition: A | Discharge status: Home / Self Care | Payer: Self-pay | Source: Ambulatory Visit | Attending: Cardiovascular Disease | Admitting: Cardiovascular Disease

## 2015-07-30 DIAGNOSIS — I252 Old myocardial infarction: Secondary | ICD-10-CM | POA: Insufficient documentation

## 2015-08-01 ENCOUNTER — Encounter (HOSPITAL_COMMUNITY): Payer: Self-pay

## 2015-08-03 ENCOUNTER — Encounter (HOSPITAL_COMMUNITY): Payer: Self-pay

## 2015-08-06 ENCOUNTER — Encounter (HOSPITAL_COMMUNITY)
Admission: RE | Admit: 2015-08-06 | Discharge: 2015-08-06 | Disposition: A | Discharge status: Home / Self Care | Payer: Self-pay | Source: Ambulatory Visit | Attending: Cardiovascular Disease | Admitting: Cardiovascular Disease

## 2015-08-08 ENCOUNTER — Encounter (HOSPITAL_COMMUNITY)
Admission: RE | Admit: 2015-08-08 | Discharge: 2015-08-08 | Disposition: A | Discharge status: Home / Self Care | Payer: Self-pay | Source: Ambulatory Visit | Attending: Cardiovascular Disease | Admitting: Cardiovascular Disease

## 2015-08-10 ENCOUNTER — Encounter (HOSPITAL_COMMUNITY): Payer: Self-pay

## 2015-08-13 ENCOUNTER — Encounter (HOSPITAL_COMMUNITY)
Admission: RE | Admit: 2015-08-13 | Discharge: 2015-08-13 | Disposition: A | Discharge status: Home / Self Care | Payer: Self-pay | Source: Ambulatory Visit | Attending: Cardiovascular Disease | Admitting: Cardiovascular Disease

## 2015-08-15 ENCOUNTER — Encounter (HOSPITAL_COMMUNITY): Payer: Self-pay

## 2015-08-16 ENCOUNTER — Telehealth: Payer: Self-pay

## 2015-08-16 NOTE — Telephone Encounter (Signed)
Faxed signed approval for patient to continue cardiac rehab.

## 2015-08-17 ENCOUNTER — Encounter (HOSPITAL_COMMUNITY)
Admission: RE | Admit: 2015-08-17 | Discharge: 2015-08-17 | Disposition: A | Discharge status: Home / Self Care | Payer: Self-pay | Source: Ambulatory Visit | Attending: Cardiovascular Disease | Admitting: Cardiovascular Disease

## 2015-08-20 ENCOUNTER — Encounter (HOSPITAL_COMMUNITY)
Admission: RE | Admit: 2015-08-20 | Discharge: 2015-08-20 | Disposition: A | Discharge status: Home / Self Care | Payer: Self-pay | Source: Ambulatory Visit | Attending: Cardiovascular Disease | Admitting: Cardiovascular Disease

## 2015-08-22 ENCOUNTER — Encounter (HOSPITAL_COMMUNITY): Payer: Self-pay

## 2015-08-24 ENCOUNTER — Encounter (HOSPITAL_COMMUNITY): Payer: Self-pay

## 2015-08-29 ENCOUNTER — Encounter (HOSPITAL_COMMUNITY): Payer: Self-pay

## 2015-08-31 ENCOUNTER — Encounter (HOSPITAL_COMMUNITY): Payer: Self-pay

## 2015-08-31 DIAGNOSIS — I252 Old myocardial infarction: Secondary | ICD-10-CM | POA: Insufficient documentation

## 2015-09-03 ENCOUNTER — Encounter (HOSPITAL_COMMUNITY): Payer: Self-pay

## 2015-09-03 ENCOUNTER — Encounter: Payer: Self-pay | Admitting: Cardiovascular Disease

## 2015-09-03 DIAGNOSIS — I429 Cardiomyopathy, unspecified: Secondary | ICD-10-CM | POA: Diagnosis not present

## 2015-09-03 DIAGNOSIS — E7800 Pure hypercholesterolemia, unspecified: Secondary | ICD-10-CM | POA: Diagnosis not present

## 2015-09-03 DIAGNOSIS — K219 Gastro-esophageal reflux disease without esophagitis: Secondary | ICD-10-CM | POA: Diagnosis not present

## 2015-09-03 DIAGNOSIS — H409 Unspecified glaucoma: Secondary | ICD-10-CM | POA: Diagnosis not present

## 2015-09-03 DIAGNOSIS — Z853 Personal history of malignant neoplasm of breast: Secondary | ICD-10-CM | POA: Diagnosis not present

## 2015-09-03 DIAGNOSIS — Z86010 Personal history of colonic polyps: Secondary | ICD-10-CM | POA: Diagnosis not present

## 2015-09-03 DIAGNOSIS — J301 Allergic rhinitis due to pollen: Secondary | ICD-10-CM | POA: Diagnosis not present

## 2015-09-03 DIAGNOSIS — I10 Essential (primary) hypertension: Secondary | ICD-10-CM | POA: Diagnosis not present

## 2015-09-03 DIAGNOSIS — Z23 Encounter for immunization: Secondary | ICD-10-CM | POA: Diagnosis not present

## 2015-09-03 DIAGNOSIS — Z0000 Encounter for general adult medical examination without abnormal findings: Secondary | ICD-10-CM | POA: Diagnosis not present

## 2015-09-05 ENCOUNTER — Encounter (HOSPITAL_COMMUNITY): Payer: Self-pay

## 2015-09-07 ENCOUNTER — Encounter (HOSPITAL_COMMUNITY)
Admission: RE | Admit: 2015-09-07 | Discharge: 2015-09-07 | Disposition: A | Discharge status: Home / Self Care | Payer: Self-pay | Source: Ambulatory Visit | Attending: Cardiovascular Disease | Admitting: Cardiovascular Disease

## 2015-09-10 ENCOUNTER — Encounter (HOSPITAL_COMMUNITY)
Admission: RE | Admit: 2015-09-10 | Discharge: 2015-09-10 | Disposition: A | Discharge status: Home / Self Care | Payer: Self-pay | Source: Ambulatory Visit | Attending: Cardiovascular Disease | Admitting: Cardiovascular Disease

## 2015-09-12 ENCOUNTER — Encounter (HOSPITAL_COMMUNITY)
Admission: RE | Admit: 2015-09-12 | Discharge: 2015-09-12 | Disposition: A | Discharge status: Home / Self Care | Payer: Self-pay | Source: Ambulatory Visit | Attending: Cardiovascular Disease | Admitting: Cardiovascular Disease

## 2015-09-14 ENCOUNTER — Encounter (HOSPITAL_COMMUNITY)
Admission: RE | Admit: 2015-09-14 | Discharge: 2015-09-14 | Disposition: A | Discharge status: Home / Self Care | Payer: Self-pay | Source: Ambulatory Visit | Attending: Cardiovascular Disease | Admitting: Cardiovascular Disease

## 2015-09-17 ENCOUNTER — Encounter (HOSPITAL_COMMUNITY): Payer: Self-pay

## 2015-09-19 ENCOUNTER — Encounter (HOSPITAL_COMMUNITY)
Admission: RE | Admit: 2015-09-19 | Discharge: 2015-09-19 | Disposition: A | Discharge status: Home / Self Care | Payer: Self-pay | Source: Ambulatory Visit | Attending: Cardiovascular Disease | Admitting: Cardiovascular Disease

## 2015-09-21 ENCOUNTER — Encounter (HOSPITAL_COMMUNITY)
Admission: RE | Admit: 2015-09-21 | Discharge: 2015-09-21 | Disposition: A | Discharge status: Home / Self Care | Payer: Self-pay | Source: Ambulatory Visit | Attending: Cardiovascular Disease | Admitting: Cardiovascular Disease

## 2015-09-24 ENCOUNTER — Encounter (HOSPITAL_COMMUNITY)
Admission: RE | Admit: 2015-09-24 | Discharge: 2015-09-24 | Disposition: A | Discharge status: Home / Self Care | Payer: Self-pay | Source: Ambulatory Visit | Attending: Cardiovascular Disease | Admitting: Cardiovascular Disease

## 2015-09-24 ENCOUNTER — Other Ambulatory Visit: Payer: Self-pay | Admitting: Cardiovascular Disease

## 2015-09-26 ENCOUNTER — Encounter (HOSPITAL_COMMUNITY): Payer: Self-pay

## 2015-09-28 ENCOUNTER — Encounter (HOSPITAL_COMMUNITY)
Admission: RE | Admit: 2015-09-28 | Discharge: 2015-09-28 | Disposition: A | Discharge status: Home / Self Care | Payer: Self-pay | Source: Ambulatory Visit | Attending: Cardiovascular Disease | Admitting: Cardiovascular Disease

## 2015-10-01 ENCOUNTER — Encounter (HOSPITAL_COMMUNITY): Payer: Self-pay

## 2015-10-01 DIAGNOSIS — I252 Old myocardial infarction: Secondary | ICD-10-CM | POA: Insufficient documentation

## 2015-10-03 ENCOUNTER — Encounter (HOSPITAL_COMMUNITY)
Admission: RE | Admit: 2015-10-03 | Discharge: 2015-10-03 | Disposition: A | Discharge status: Home / Self Care | Payer: Self-pay | Source: Ambulatory Visit | Attending: Cardiovascular Disease | Admitting: Cardiovascular Disease

## 2015-10-05 ENCOUNTER — Encounter (HOSPITAL_COMMUNITY): Payer: Self-pay

## 2015-10-08 ENCOUNTER — Encounter (HOSPITAL_COMMUNITY)
Admission: RE | Admit: 2015-10-08 | Discharge: 2015-10-08 | Disposition: A | Discharge status: Home / Self Care | Payer: Self-pay | Source: Ambulatory Visit | Attending: Cardiovascular Disease | Admitting: Cardiovascular Disease

## 2015-10-10 ENCOUNTER — Encounter (HOSPITAL_COMMUNITY)
Admission: RE | Admit: 2015-10-10 | Discharge: 2015-10-10 | Disposition: A | Discharge status: Home / Self Care | Payer: Self-pay | Source: Ambulatory Visit | Attending: Cardiovascular Disease | Admitting: Cardiovascular Disease

## 2015-10-12 ENCOUNTER — Encounter (HOSPITAL_COMMUNITY)
Admission: RE | Admit: 2015-10-12 | Discharge: 2015-10-12 | Disposition: A | Discharge status: Home / Self Care | Payer: Self-pay | Source: Ambulatory Visit | Attending: Cardiovascular Disease | Admitting: Cardiovascular Disease

## 2015-10-15 ENCOUNTER — Encounter (HOSPITAL_COMMUNITY)
Admission: RE | Admit: 2015-10-15 | Discharge: 2015-10-15 | Disposition: A | Discharge status: Home / Self Care | Payer: Self-pay | Source: Ambulatory Visit | Attending: Cardiovascular Disease | Admitting: Cardiovascular Disease

## 2015-10-17 ENCOUNTER — Encounter (HOSPITAL_COMMUNITY)
Admission: RE | Admit: 2015-10-17 | Discharge: 2015-10-17 | Disposition: A | Discharge status: Home / Self Care | Payer: Self-pay | Source: Ambulatory Visit | Attending: Cardiovascular Disease | Admitting: Cardiovascular Disease

## 2015-10-17 ENCOUNTER — Other Ambulatory Visit: Payer: Self-pay | Admitting: Cardiovascular Disease

## 2015-10-19 ENCOUNTER — Encounter (HOSPITAL_COMMUNITY): Payer: Self-pay

## 2015-10-22 ENCOUNTER — Encounter (HOSPITAL_COMMUNITY)
Admission: RE | Admit: 2015-10-22 | Discharge: 2015-10-22 | Disposition: A | Discharge status: Home / Self Care | Payer: Self-pay | Source: Ambulatory Visit | Attending: Cardiovascular Disease | Admitting: Cardiovascular Disease

## 2015-10-24 ENCOUNTER — Encounter (HOSPITAL_COMMUNITY): Payer: Self-pay

## 2015-10-26 ENCOUNTER — Encounter (HOSPITAL_COMMUNITY)
Admission: RE | Admit: 2015-10-26 | Discharge: 2015-10-26 | Disposition: A | Discharge status: Home / Self Care | Payer: Self-pay | Source: Ambulatory Visit | Attending: Cardiovascular Disease | Admitting: Cardiovascular Disease

## 2015-10-29 ENCOUNTER — Encounter (HOSPITAL_COMMUNITY)
Admission: RE | Admit: 2015-10-29 | Discharge: 2015-10-29 | Disposition: A | Discharge status: Home / Self Care | Payer: Self-pay | Source: Ambulatory Visit | Attending: Cardiovascular Disease | Admitting: Cardiovascular Disease

## 2015-10-31 ENCOUNTER — Encounter (HOSPITAL_COMMUNITY)
Admission: RE | Admit: 2015-10-31 | Discharge: 2015-10-31 | Disposition: A | Discharge status: Home / Self Care | Payer: Self-pay | Source: Ambulatory Visit | Attending: Cardiovascular Disease | Admitting: Cardiovascular Disease

## 2015-10-31 DIAGNOSIS — I252 Old myocardial infarction: Secondary | ICD-10-CM | POA: Insufficient documentation

## 2015-11-01 DIAGNOSIS — M25562 Pain in left knee: Secondary | ICD-10-CM | POA: Diagnosis not present

## 2015-11-01 DIAGNOSIS — M25561 Pain in right knee: Secondary | ICD-10-CM | POA: Diagnosis not present

## 2015-11-01 DIAGNOSIS — M1712 Unilateral primary osteoarthritis, left knee: Secondary | ICD-10-CM | POA: Diagnosis not present

## 2015-11-01 DIAGNOSIS — M1711 Unilateral primary osteoarthritis, right knee: Secondary | ICD-10-CM | POA: Diagnosis not present

## 2015-11-02 ENCOUNTER — Encounter (HOSPITAL_COMMUNITY): Payer: Self-pay

## 2015-11-05 ENCOUNTER — Encounter (HOSPITAL_COMMUNITY)
Admission: RE | Admit: 2015-11-05 | Discharge: 2015-11-05 | Disposition: A | Discharge status: Home / Self Care | Payer: Self-pay | Source: Ambulatory Visit | Attending: Cardiovascular Disease | Admitting: Cardiovascular Disease

## 2015-11-07 ENCOUNTER — Encounter (HOSPITAL_COMMUNITY): Payer: Self-pay

## 2015-11-09 ENCOUNTER — Encounter (HOSPITAL_COMMUNITY)
Admission: RE | Admit: 2015-11-09 | Discharge: 2015-11-09 | Disposition: A | Discharge status: Home / Self Care | Payer: Self-pay | Source: Ambulatory Visit | Attending: Cardiovascular Disease | Admitting: Cardiovascular Disease

## 2015-11-12 ENCOUNTER — Encounter (HOSPITAL_COMMUNITY)
Admission: RE | Admit: 2015-11-12 | Discharge: 2015-11-12 | Disposition: A | Discharge status: Home / Self Care | Payer: Self-pay | Source: Ambulatory Visit | Attending: Cardiovascular Disease | Admitting: Cardiovascular Disease

## 2015-11-14 ENCOUNTER — Encounter (HOSPITAL_COMMUNITY)
Admission: RE | Admit: 2015-11-14 | Discharge: 2015-11-14 | Disposition: A | Discharge status: Home / Self Care | Payer: Self-pay | Source: Ambulatory Visit | Attending: Cardiovascular Disease | Admitting: Cardiovascular Disease

## 2015-11-16 ENCOUNTER — Encounter (HOSPITAL_COMMUNITY): Payer: Self-pay

## 2015-11-19 ENCOUNTER — Encounter (HOSPITAL_COMMUNITY)
Admission: RE | Admit: 2015-11-19 | Discharge: 2015-11-19 | Disposition: A | Discharge status: Home / Self Care | Payer: Self-pay | Source: Ambulatory Visit | Attending: Cardiovascular Disease | Admitting: Cardiovascular Disease

## 2015-11-19 DIAGNOSIS — M25562 Pain in left knee: Secondary | ICD-10-CM | POA: Diagnosis not present

## 2015-11-19 DIAGNOSIS — M25561 Pain in right knee: Secondary | ICD-10-CM | POA: Diagnosis not present

## 2015-11-19 DIAGNOSIS — M1712 Unilateral primary osteoarthritis, left knee: Secondary | ICD-10-CM | POA: Diagnosis not present

## 2015-11-19 DIAGNOSIS — M1711 Unilateral primary osteoarthritis, right knee: Secondary | ICD-10-CM | POA: Diagnosis not present

## 2015-11-21 ENCOUNTER — Encounter (HOSPITAL_COMMUNITY): Payer: Self-pay

## 2015-11-23 ENCOUNTER — Encounter (HOSPITAL_COMMUNITY)
Admission: RE | Admit: 2015-11-23 | Discharge: 2015-11-23 | Disposition: A | Discharge status: Home / Self Care | Payer: Self-pay | Source: Ambulatory Visit | Attending: Cardiovascular Disease | Admitting: Cardiovascular Disease

## 2015-11-23 DIAGNOSIS — Z86010 Personal history of colonic polyps: Secondary | ICD-10-CM | POA: Insufficient documentation

## 2015-11-23 DIAGNOSIS — J301 Allergic rhinitis due to pollen: Secondary | ICD-10-CM | POA: Insufficient documentation

## 2015-11-23 DIAGNOSIS — M8588 Other specified disorders of bone density and structure, other site: Secondary | ICD-10-CM | POA: Insufficient documentation

## 2015-11-23 DIAGNOSIS — E559 Vitamin D deficiency, unspecified: Secondary | ICD-10-CM | POA: Insufficient documentation

## 2015-11-23 DIAGNOSIS — H409 Unspecified glaucoma: Secondary | ICD-10-CM | POA: Insufficient documentation

## 2015-11-23 DIAGNOSIS — F39 Unspecified mood [affective] disorder: Secondary | ICD-10-CM | POA: Insufficient documentation

## 2015-11-23 DIAGNOSIS — J45909 Unspecified asthma, uncomplicated: Secondary | ICD-10-CM | POA: Insufficient documentation

## 2015-11-26 ENCOUNTER — Encounter (HOSPITAL_COMMUNITY)
Admission: RE | Admit: 2015-11-26 | Discharge: 2015-11-26 | Disposition: A | Discharge status: Home / Self Care | Payer: Self-pay | Source: Ambulatory Visit | Attending: Cardiovascular Disease | Admitting: Cardiovascular Disease

## 2015-11-26 ENCOUNTER — Encounter (INDEPENDENT_AMBULATORY_CARE_PROVIDER_SITE_OTHER): Payer: Self-pay

## 2015-11-26 ENCOUNTER — Ambulatory Visit (INDEPENDENT_AMBULATORY_CARE_PROVIDER_SITE_OTHER): Payer: Medicare Other | Admitting: Cardiovascular Disease

## 2015-11-26 ENCOUNTER — Encounter: Payer: Self-pay | Admitting: Cardiovascular Disease

## 2015-11-26 VITALS — BP 120/70 | HR 74 | Ht 63.0 in | Wt 188.0 lb

## 2015-11-26 DIAGNOSIS — E785 Hyperlipidemia, unspecified: Secondary | ICD-10-CM | POA: Diagnosis not present

## 2015-11-26 DIAGNOSIS — I25118 Atherosclerotic heart disease of native coronary artery with other forms of angina pectoris: Secondary | ICD-10-CM | POA: Diagnosis not present

## 2015-11-26 DIAGNOSIS — Z7901 Long term (current) use of anticoagulants: Secondary | ICD-10-CM | POA: Diagnosis not present

## 2015-11-26 DIAGNOSIS — I480 Paroxysmal atrial fibrillation: Secondary | ICD-10-CM

## 2015-11-26 NOTE — Patient Instructions (Signed)
Dr Croitoru recommends that you schedule a follow-up appointment in 1 year. You will receive a reminder letter in the mail two months in advance. If you don't receive a letter, please call our office to schedule the follow-up appointment.  If you need a refill on your cardiac medications before your next appointment, please call your pharmacy. 

## 2015-11-26 NOTE — Progress Notes (Signed)
Cardiology Office Note    Date:  11/26/2015   ID:  Melinda, [name] 1934/07/20, MRN [id number]  PCP:  Osborne Casco, MD  Cardiologist:   Sanda Klein, MD   Chief Complaint  Patient presents with  . Follow-up    History of Present Illness:  Melinda [name] is a 81 y.o. female with a previous history of paroxysmal atrial fibrillation (asymptomatic), takotsubo cardiomyopathy with complete resolution, minor coronary artery disease with 75% stenosis of a first oblique marginal artery (asymptomatic). Last echo in August 2015 showed normal LVEF 55-60%.  She feels great and has no complaints whatsoever. She specifically denies exertional angina dyspnea, leg edema, syncope, palpitations, claudication, bleeding problems, focal neurological events or other cardiovascular complaints. She is worried about her inability to afford Eliquis, once her "promotional support" from the manufacturer and is in a month. Her husband takes warfarin and she is willing to undergo the monthly blood tests if she has to since the medication is simply not affordable.    Past Medical History:  Diagnosis Date  . Allergy   . Anemia 1984   before hysterectomy  . Cancer ([place])    bilateral breast  . GERD (gastroesophageal reflux disease)   . History of blood product transfusion 1984  . Hyperlipidemia   . Hypertension   . Macular degeneration    right eye  . Myocardial infarction ([place])   . Osteopenia   . Takotsubo cardiomyopathy     Past Surgical History:  Procedure Laterality Date  . ABDOMINAL HYSTERECTOMY  1984  . APPENDECTOMY    . BREAST BIOPSY  04/29/05   right   . BREAST LUMPECTOMY  04/19/04   left with sentinel node  . CATARACT EXTRACTION     bilateral  . EYE SURGERY    . LEFT HEART CATHETERIZATION WITH CORONARY ANGIOGRAM N/A 07/26/2013   Procedure: LEFT HEART CATHETERIZATION WITH CORONARY ANGIOGRAM;  Surgeon: Jettie Booze, MD;  Location: [facility] CATH LAB;  Service:  Cardiovascular;  Laterality: N/A;  . RETINAL LASER PROCEDURE     right    Current Medications: Outpatient Medications Prior to Visit  Medication Sig Dispense Refill  . acetaminophen (TYLENOL) 500 MG tablet Take 1,000 mg by mouth at bedtime as needed for mild pain.    Marland Kitchen atorvastatin (LIPITOR) 20 MG tablet Take 20 mg by mouth at bedtime.     . dorzolamide-timolol (COSOPT) 22.3-6.8 MG/ML ophthalmic solution Place 1 drop into both eyes 2 (two) times daily.     Marland Kitchen ELIQUIS 5 MG TABS tablet TAKE 1 TABLET (5 MG TOTAL) BY MOUTH 2 (TWO) TIMES DAILY. 60 tablet 3  . fexofenadine (ALLEGRA) 180 MG tablet Take 180 mg by mouth daily.     . hydrochlorothiazide (MICROZIDE) 12.5 MG capsule Take 1 capsule (12.5 mg total) by mouth 3 (three) times a week. Take 1 pill every Monday, Wednesday, and Friday. 45 capsule 3  . HYDROcodone-acetaminophen (NORCO) 5-325 MG per tablet Take 1 tablet by mouth daily.     Marland Kitchen latanoprost (XALATAN) 0.005 % ophthalmic solution Place 1 drop into both eyes at bedtime.     . metoprolol succinate (TOPROL-XL) 25 MG 24 hr tablet Take 1 tablet (25 mg total) by mouth daily. 90 tablet 1  . nitroGLYCERIN (NITROSTAT) 0.4 MG SL tablet Place 0.4 mg under the tongue every 5 (five) minutes as needed for chest pain.    Marland Kitchen sertraline (ZOLOFT) 25 MG tablet     . valsartan (DIOVAN) 160 MG tablet Take 160  mg by mouth daily.     . hydrochlorothiazide (MICROZIDE) 12.5 MG capsule Take 1 capsule (12.5 mg total) by mouth daily. (Patient not taking: Reported on 11/26/2015) 36 capsule 3  . hydrochlorothiazide (MICROZIDE) 12.5 MG capsule TAKE 1 CAPSULE (12.5 MG TOTAL) BY MOUTH 3 (THREE) TIMES A WEEK. TAKE [place], WEDNESDAY, AND FRIDAY. (Patient not taking: Reported on 11/26/2015) 36 capsule 2   No facility-administered medications prior to visit.      Allergies:   Ibuprofen; Nsaids; Salicylates; Codeine; Etodolac; and Hydrochlorothiazide   Social History   Social History  . Marital status:  Married    Spouse name: N/A  . Number of children: N/A  . Years of education: N/A   Social History Main Topics  . Smoking status: Former Research scientist (life sciences)  . Smokeless tobacco: Never Used  . Alcohol use 1.8 oz/week    3 Shots of liquor per week     Comment: 2-3 drinks containing a shot each of whiskey per night  . Drug use: No  . Sexual activity: Not Asked   Other Topics Concern  . None   Social History Narrative  . None     Family History:  The patient's family history includes Cancer in her father, maternal aunt, and maternal grandmother; Dementia in her mother; Heart attack in her maternal grandfather and paternal grandfather; Heart disease (age of onset: 79) in her father; Heart disease (age of onset: 65) in her mother; Stroke in her mother.   ROS:   Please see the history of present illness.    ROS All other systems reviewed and are negative.   PHYSICAL EXAM:   VS:  BP 120/70 (BP Location: Right Arm, Patient Position: Sitting, Cuff Size: Normal)   Pulse 74   Ht 5\' 3"  (1.6 m)   Wt 188 lb (85.3 kg)   SpO2 94%   BMI 33.30 kg/m    GEN: Well nourished, well developed, in no acute distress  HEENT: normal  Neck: no JVD, carotid bruits, or masses Cardiac: RRR; no murmurs, rubs, or gallops,no edema  Respiratory:  clear to auscultation bilaterally, normal work of breathing GI: soft, nontender, nondistended, + BS MS: no deformity or atrophy  Skin: warm and dry, no rash Neuro:  Alert and Oriented x 3, Strength and sensation are intact Psych: euthymic mood, full affect  Wt Readings from Last 3 Encounters:  11/26/15 188 lb (85.3 kg)  09/01/14 188 lb (85.3 kg)  06/06/14 189 lb 4.8 oz (85.9 kg)      Studies/Labs Reviewed:   EKG:  EKG is ordered today.  The ekg ordered today demonstrates Sinus rhythm, QTC 439 ms  Recent Labs: 09/03/2015 Glucose 95, creatinine 0.58, normal electrolytes and normal liver function tests  Lipid Panel    Component Value Date/Time   CHOL 159  07/26/2013 0718   TRIG 86 07/26/2013 0718   HDL 89 07/26/2013 0718   CHOLHDL 1.8 07/26/2013 0718   VLDL 17 07/26/2013 0718   LDLCALC 53 07/26/2013 0718    09/03/2015 Total cholesterol 163, triglycerides 104, HDL 68, LDL 74  ASSESSMENT:    No diagnosis found.   PLAN:  In order of problems listed above:  1. AFib: He was never aware of the arrhythmia and does not currently have palpitations. Understands the need for lifelong anticoagulation. CHADSVasc 5 (age 19, gender, HTN, CAD). 2. If she is unable to extend a lower cost offer for Eliquis will switch to warfarin and enroll her in the Coumadin clinic.  3. CAD: Incidentally discovered when she had coronary angiography for takotsubo syndrome. Asymptomatic on the current medical regimen that includes a beta blocker. About a year ago had angina during more intense physical activity, but this has not returned Medical therapy at this point.  4. HLP: A lipid parameters are in satisfactory range 5. HTN: good control    Medication Adjustments/Labs and Tests Ordered: Current medicines are reviewed at length with the patient today.  Concerns regarding medicines are outlined above.  Medication changes, Labs and Tests ordered today are listed in the Patient Instructions below. There are no Patient Instructions on file for this visit.   Signed, Sanda Klein, MD  11/26/2015 3:20 PM    [facility] [address], [place], [place]  [address] Phone: [phone]; Fax: [phone]

## 2015-11-27 DIAGNOSIS — M25561 Pain in right knee: Secondary | ICD-10-CM | POA: Diagnosis not present

## 2015-11-27 DIAGNOSIS — M1712 Unilateral primary osteoarthritis, left knee: Secondary | ICD-10-CM | POA: Diagnosis not present

## 2015-11-27 DIAGNOSIS — M1711 Unilateral primary osteoarthritis, right knee: Secondary | ICD-10-CM | POA: Diagnosis not present

## 2015-11-27 DIAGNOSIS — M25562 Pain in left knee: Secondary | ICD-10-CM | POA: Diagnosis not present

## 2015-11-28 ENCOUNTER — Encounter (HOSPITAL_COMMUNITY)
Admission: RE | Admit: 2015-11-28 | Discharge: 2015-11-28 | Disposition: A | Discharge status: Home / Self Care | Payer: Self-pay | Source: Ambulatory Visit | Attending: Cardiovascular Disease | Admitting: Cardiovascular Disease

## 2015-11-29 DIAGNOSIS — M25562 Pain in left knee: Secondary | ICD-10-CM | POA: Diagnosis not present

## 2015-11-29 DIAGNOSIS — M1711 Unilateral primary osteoarthritis, right knee: Secondary | ICD-10-CM | POA: Diagnosis not present

## 2015-11-29 DIAGNOSIS — M1712 Unilateral primary osteoarthritis, left knee: Secondary | ICD-10-CM | POA: Diagnosis not present

## 2015-11-29 DIAGNOSIS — M25561 Pain in right knee: Secondary | ICD-10-CM | POA: Diagnosis not present

## 2015-11-30 ENCOUNTER — Encounter (HOSPITAL_COMMUNITY): Payer: Self-pay

## 2015-11-30 DIAGNOSIS — I252 Old myocardial infarction: Secondary | ICD-10-CM | POA: Insufficient documentation

## 2015-12-04 DIAGNOSIS — M1712 Unilateral primary osteoarthritis, left knee: Secondary | ICD-10-CM | POA: Diagnosis not present

## 2015-12-04 DIAGNOSIS — M25561 Pain in right knee: Secondary | ICD-10-CM | POA: Diagnosis not present

## 2015-12-04 DIAGNOSIS — M25562 Pain in left knee: Secondary | ICD-10-CM | POA: Diagnosis not present

## 2015-12-04 DIAGNOSIS — M1711 Unilateral primary osteoarthritis, right knee: Secondary | ICD-10-CM | POA: Diagnosis not present

## 2015-12-05 ENCOUNTER — Encounter (HOSPITAL_COMMUNITY): Payer: Self-pay

## 2015-12-06 DIAGNOSIS — M25562 Pain in left knee: Secondary | ICD-10-CM | POA: Diagnosis not present

## 2015-12-06 DIAGNOSIS — M1711 Unilateral primary osteoarthritis, right knee: Secondary | ICD-10-CM | POA: Diagnosis not present

## 2015-12-06 DIAGNOSIS — M1712 Unilateral primary osteoarthritis, left knee: Secondary | ICD-10-CM | POA: Diagnosis not present

## 2015-12-06 DIAGNOSIS — M25561 Pain in right knee: Secondary | ICD-10-CM | POA: Diagnosis not present

## 2015-12-07 ENCOUNTER — Encounter (HOSPITAL_COMMUNITY)
Admission: RE | Admit: 2015-12-07 | Discharge: 2015-12-07 | Disposition: A | Discharge status: Home / Self Care | Payer: Self-pay | Source: Ambulatory Visit | Attending: Cardiovascular Disease | Admitting: Cardiovascular Disease

## 2015-12-10 ENCOUNTER — Encounter (HOSPITAL_COMMUNITY)
Admission: RE | Admit: 2015-12-10 | Discharge: 2015-12-10 | Disposition: A | Discharge status: Home / Self Care | Payer: Self-pay | Source: Ambulatory Visit | Attending: Cardiovascular Disease | Admitting: Cardiovascular Disease

## 2015-12-10 DIAGNOSIS — H401121 Primary open-angle glaucoma, left eye, mild stage: Secondary | ICD-10-CM | POA: Diagnosis not present

## 2015-12-10 DIAGNOSIS — H02403 Unspecified ptosis of bilateral eyelids: Secondary | ICD-10-CM | POA: Diagnosis not present

## 2015-12-10 DIAGNOSIS — H019 Unspecified inflammation of eyelid: Secondary | ICD-10-CM | POA: Diagnosis not present

## 2015-12-10 DIAGNOSIS — H401111 Primary open-angle glaucoma, right eye, mild stage: Secondary | ICD-10-CM | POA: Diagnosis not present

## 2015-12-11 DIAGNOSIS — M25561 Pain in right knee: Secondary | ICD-10-CM | POA: Diagnosis not present

## 2015-12-11 DIAGNOSIS — M1712 Unilateral primary osteoarthritis, left knee: Secondary | ICD-10-CM | POA: Diagnosis not present

## 2015-12-11 DIAGNOSIS — M1711 Unilateral primary osteoarthritis, right knee: Secondary | ICD-10-CM | POA: Diagnosis not present

## 2015-12-11 DIAGNOSIS — M25562 Pain in left knee: Secondary | ICD-10-CM | POA: Diagnosis not present

## 2015-12-12 ENCOUNTER — Encounter (HOSPITAL_COMMUNITY)
Admission: RE | Admit: 2015-12-12 | Discharge: 2015-12-12 | Disposition: A | Discharge status: Home / Self Care | Payer: Self-pay | Source: Ambulatory Visit | Attending: Cardiovascular Disease | Admitting: Cardiovascular Disease

## 2015-12-13 DIAGNOSIS — M25561 Pain in right knee: Secondary | ICD-10-CM | POA: Diagnosis not present

## 2015-12-13 DIAGNOSIS — M25562 Pain in left knee: Secondary | ICD-10-CM | POA: Diagnosis not present

## 2015-12-13 DIAGNOSIS — M1712 Unilateral primary osteoarthritis, left knee: Secondary | ICD-10-CM | POA: Diagnosis not present

## 2015-12-14 ENCOUNTER — Encounter (HOSPITAL_COMMUNITY)
Admission: RE | Admit: 2015-12-14 | Discharge: 2015-12-14 | Disposition: A | Discharge status: Home / Self Care | Payer: Self-pay | Source: Ambulatory Visit | Attending: Cardiovascular Disease | Admitting: Cardiovascular Disease

## 2015-12-17 ENCOUNTER — Encounter (HOSPITAL_COMMUNITY)
Admission: RE | Admit: 2015-12-17 | Discharge: 2015-12-17 | Disposition: A | Discharge status: Home / Self Care | Payer: Self-pay | Source: Ambulatory Visit | Attending: Cardiovascular Disease | Admitting: Cardiovascular Disease

## 2015-12-18 DIAGNOSIS — M1711 Unilateral primary osteoarthritis, right knee: Secondary | ICD-10-CM | POA: Diagnosis not present

## 2015-12-18 DIAGNOSIS — M1712 Unilateral primary osteoarthritis, left knee: Secondary | ICD-10-CM | POA: Diagnosis not present

## 2015-12-18 DIAGNOSIS — M25562 Pain in left knee: Secondary | ICD-10-CM | POA: Diagnosis not present

## 2015-12-18 DIAGNOSIS — M25561 Pain in right knee: Secondary | ICD-10-CM | POA: Diagnosis not present

## 2015-12-19 ENCOUNTER — Encounter (HOSPITAL_COMMUNITY)
Admission: RE | Admit: 2015-12-19 | Discharge: 2015-12-19 | Disposition: A | Discharge status: Home / Self Care | Payer: Self-pay | Source: Ambulatory Visit | Attending: Cardiovascular Disease | Admitting: Cardiovascular Disease

## 2015-12-21 ENCOUNTER — Encounter (HOSPITAL_COMMUNITY): Payer: Self-pay

## 2015-12-24 ENCOUNTER — Encounter (HOSPITAL_COMMUNITY)
Admission: RE | Admit: 2015-12-24 | Discharge: 2015-12-24 | Disposition: A | Discharge status: Home / Self Care | Payer: Self-pay | Source: Ambulatory Visit | Attending: Cardiovascular Disease | Admitting: Cardiovascular Disease

## 2015-12-24 DIAGNOSIS — M1711 Unilateral primary osteoarthritis, right knee: Secondary | ICD-10-CM | POA: Diagnosis not present

## 2015-12-24 DIAGNOSIS — M1712 Unilateral primary osteoarthritis, left knee: Secondary | ICD-10-CM | POA: Diagnosis not present

## 2015-12-24 DIAGNOSIS — M25562 Pain in left knee: Secondary | ICD-10-CM | POA: Diagnosis not present

## 2015-12-24 DIAGNOSIS — M25561 Pain in right knee: Secondary | ICD-10-CM | POA: Diagnosis not present

## 2015-12-26 ENCOUNTER — Encounter (HOSPITAL_COMMUNITY)
Admission: RE | Admit: 2015-12-26 | Discharge: 2015-12-26 | Disposition: A | Discharge status: Home / Self Care | Payer: Self-pay | Source: Ambulatory Visit | Attending: Cardiovascular Disease | Admitting: Cardiovascular Disease

## 2015-12-28 ENCOUNTER — Encounter (HOSPITAL_COMMUNITY)
Admission: RE | Admit: 2015-12-28 | Discharge: 2015-12-28 | Disposition: A | Discharge status: Home / Self Care | Payer: Self-pay | Source: Ambulatory Visit | Attending: Cardiovascular Disease | Admitting: Cardiovascular Disease

## 2015-12-31 ENCOUNTER — Encounter (HOSPITAL_COMMUNITY)
Admission: RE | Admit: 2015-12-31 | Discharge: 2015-12-31 | Disposition: A | Discharge status: Home / Self Care | Payer: Self-pay | Source: Ambulatory Visit | Attending: Cardiovascular Disease | Admitting: Cardiovascular Disease

## 2015-12-31 DIAGNOSIS — I252 Old myocardial infarction: Secondary | ICD-10-CM | POA: Insufficient documentation

## 2016-01-02 ENCOUNTER — Encounter (HOSPITAL_COMMUNITY): Payer: Self-pay

## 2016-01-04 ENCOUNTER — Encounter (HOSPITAL_COMMUNITY): Payer: Self-pay

## 2016-01-07 ENCOUNTER — Encounter (HOSPITAL_COMMUNITY)
Admission: RE | Admit: 2016-01-07 | Discharge: 2016-01-07 | Disposition: A | Discharge status: Home / Self Care | Payer: Self-pay | Source: Ambulatory Visit | Attending: Cardiovascular Disease | Admitting: Cardiovascular Disease

## 2016-01-07 IMAGING — CT CT HEAD W/O CM
1 series · 16 of 30 positions shown, 20 images · non-contrast
Comparison: None.

CLINICAL DATA: syncope, head injury

EXAM:
CT HEAD WITHOUT CONTRAST
TECHNIQUE: Contiguous axial images were obtained from the base of the skull
through the vertex without intravenous contrast.

[Series 2: head 5.0 h30s · axial · 0.42mm/px · z∈[-198,-63]mm · 16 of 31 slices shown, 20 images]
[im 2/31  brain]
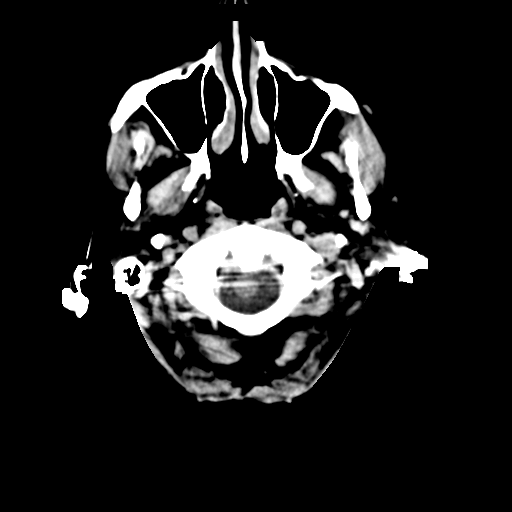
[im 2/31  bone]
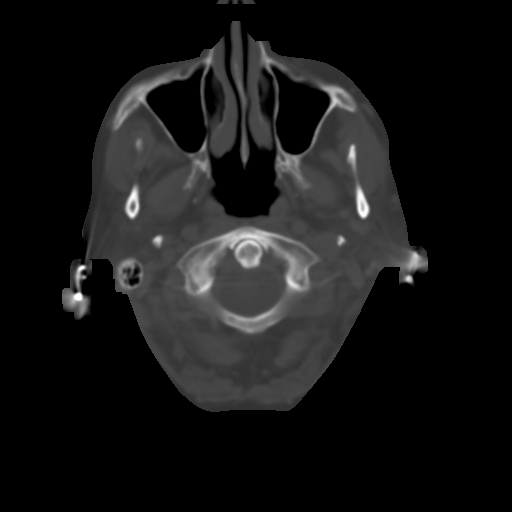
[im 4/31  brain]
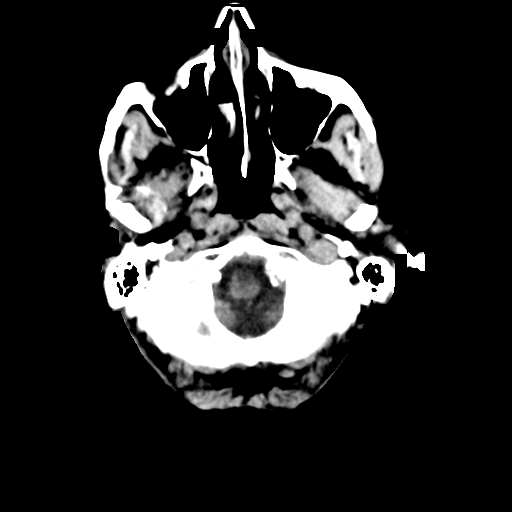
[im 6/31  brain]
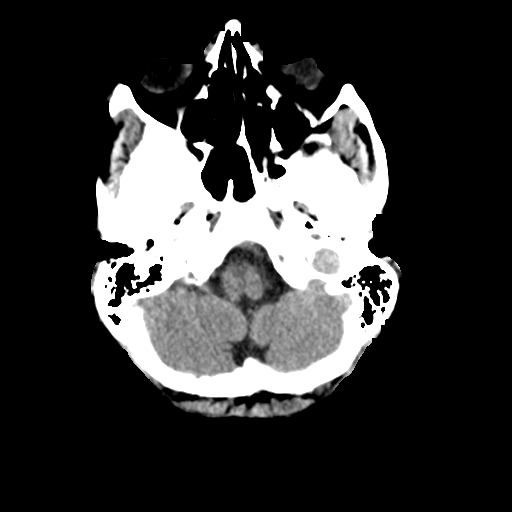
[im 8/31  brain]
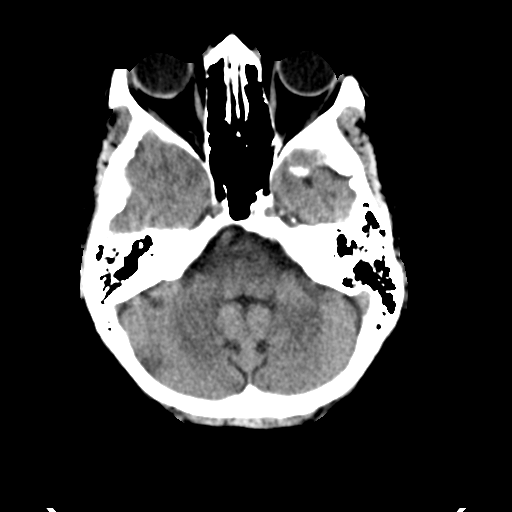
[im 9/31  brain]
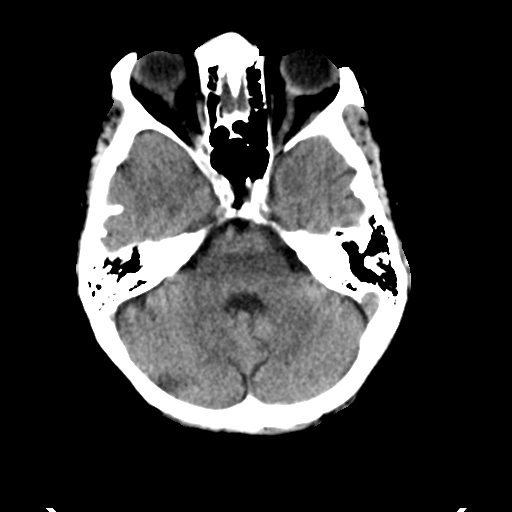
[im 9/31  bone]
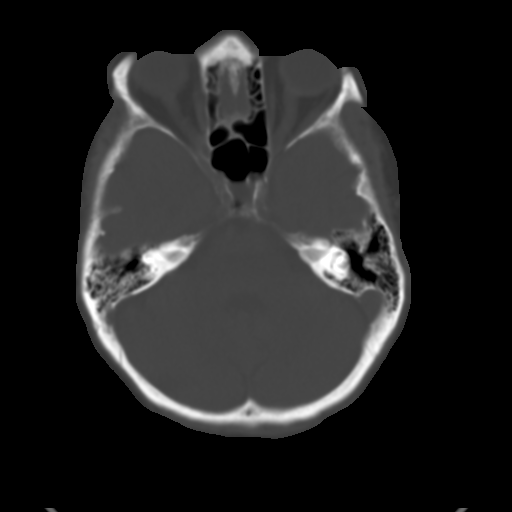
[im 11/31  brain]
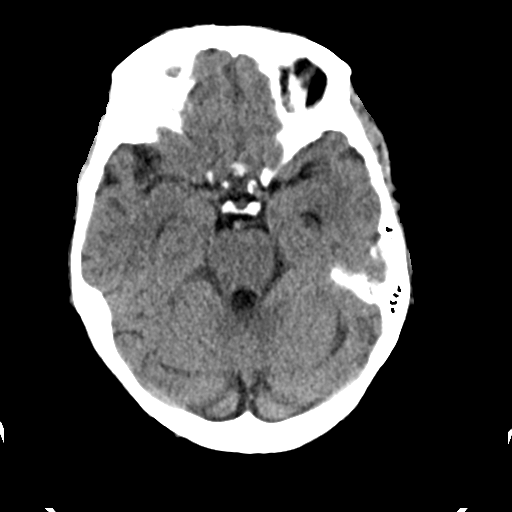
[im 13/31  brain]
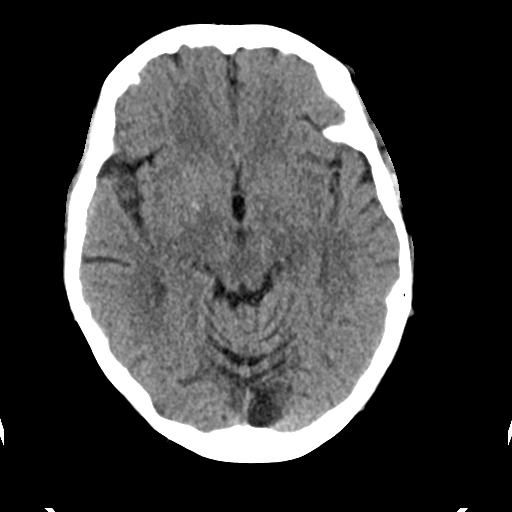
[im 15/31  brain]
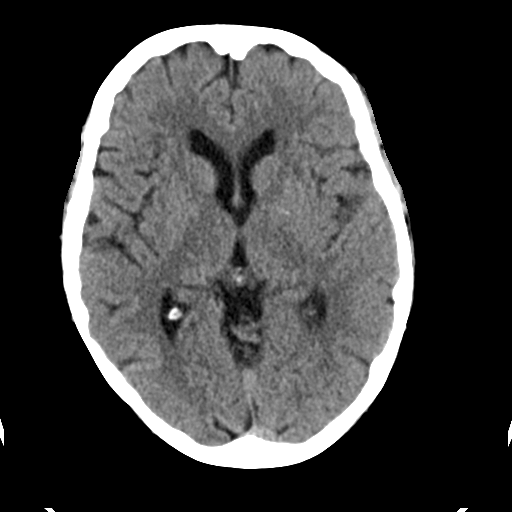
[im 16/31  brain]
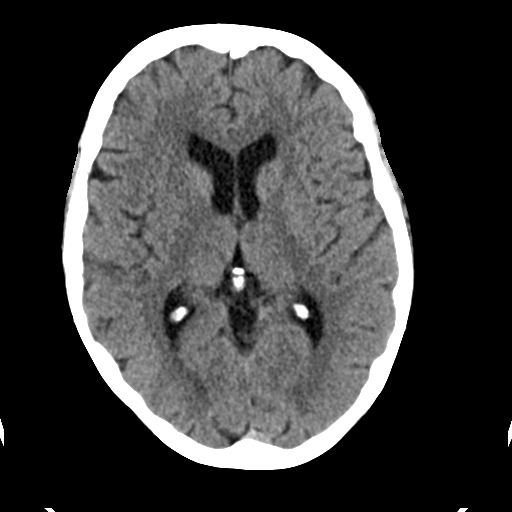
[im 16/31  bone]
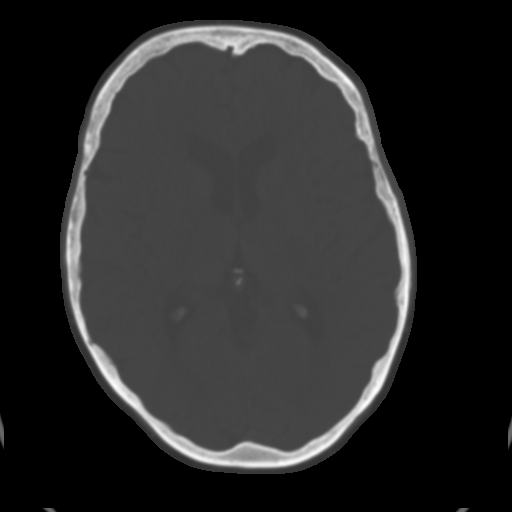
[im 18/31  brain]
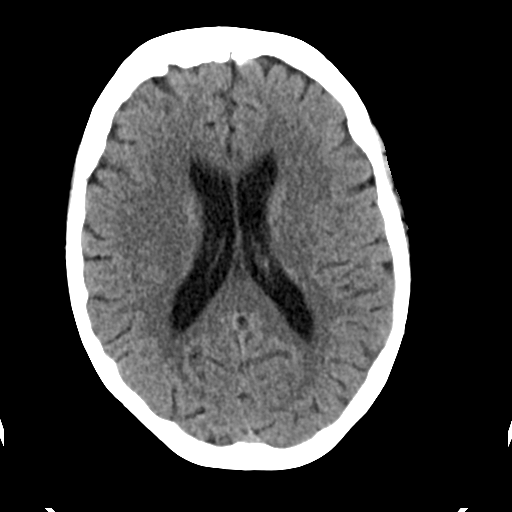
[im 20/31  brain]
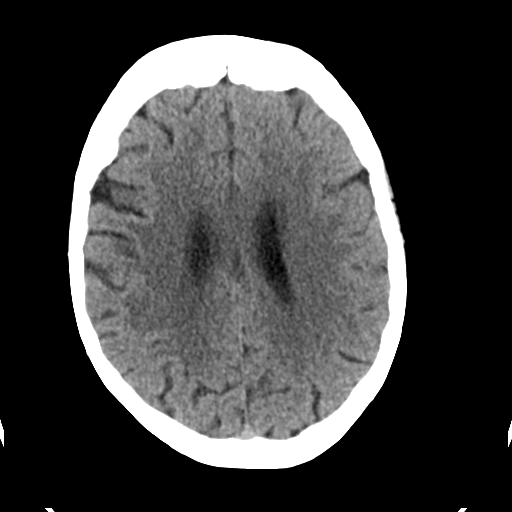
[im 22/31  brain]
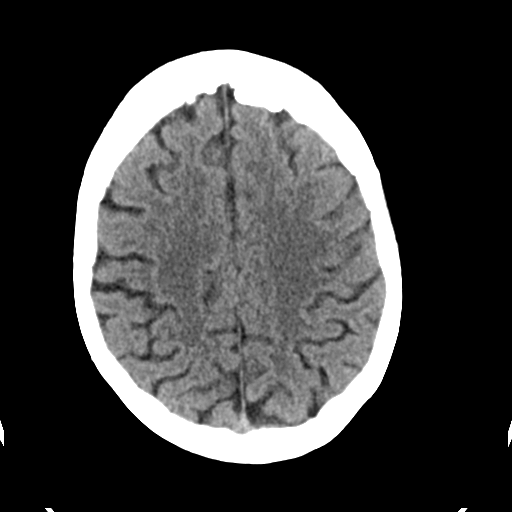
[im 23/31  brain]
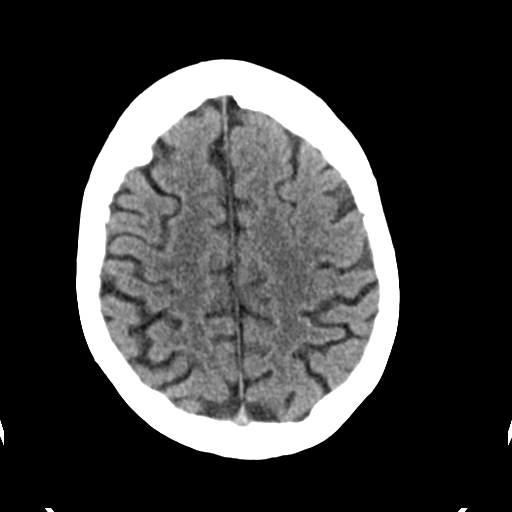
[im 23/31  bone]
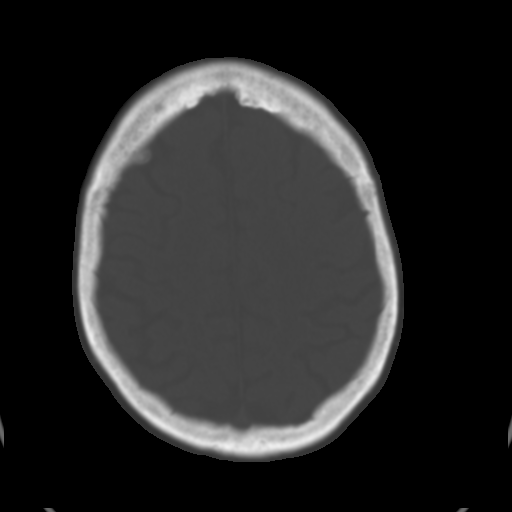
[im 25/31  brain]
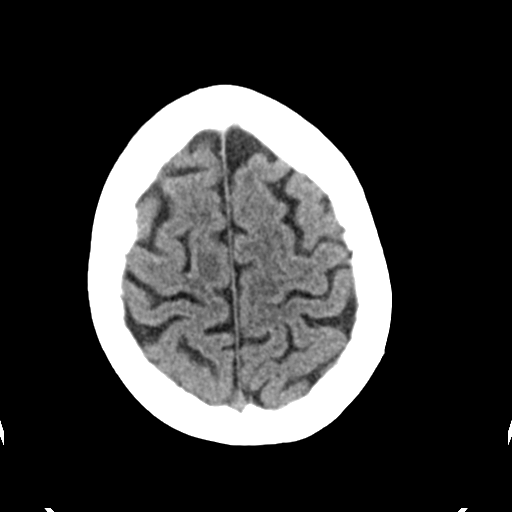
[im 27/31  brain]
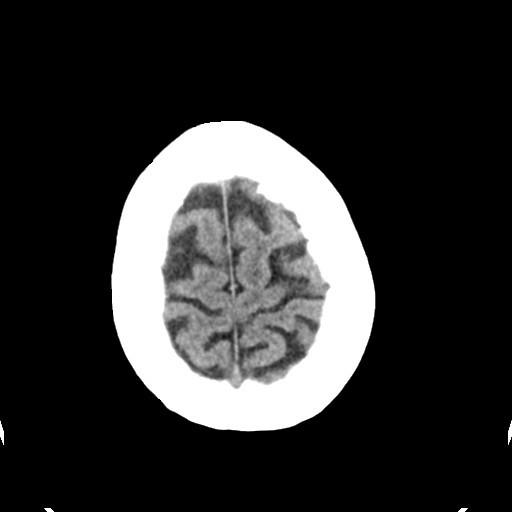
[im 29/31  brain]
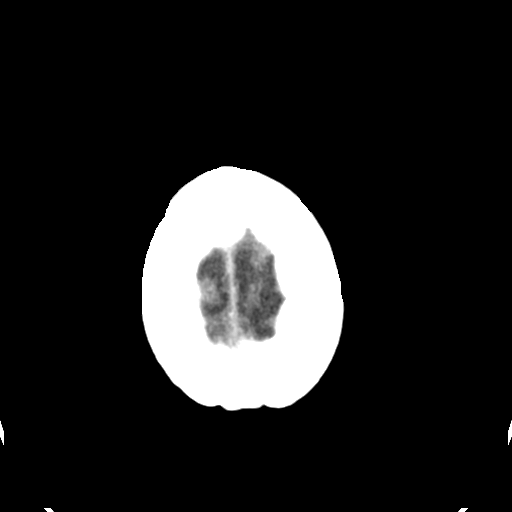

[16 of 30 positions shown; findings below may reference images not displayed]

FINDINGS: Atherosclerotic and physiologic intracranial calcifications. Small
falcine lipoma. Bilateral basal ganglia mineralization. Mild diffuse
atrophy. There is no evidence of acute intracranial hemorrhage,
brain edema, mass lesion, acute infarction, mass effect, or midline
shift. Acute infarct may be inapparent on noncontrast CT. No other
intra-axial abnormalities are seen, and the ventricles and sulci are
within normal limits in size and symmetry. No abnormal extra-axial
fluid collections or masses are identified. No significant calvarial
abnormality.
IMPRESSION: 1. Negative for bleed or other acute intracranial process.

## 2016-01-09 ENCOUNTER — Encounter (HOSPITAL_COMMUNITY): Payer: Self-pay

## 2016-01-11 ENCOUNTER — Encounter (HOSPITAL_COMMUNITY)
Admission: RE | Admit: 2016-01-11 | Discharge: 2016-01-11 | Disposition: A | Discharge status: Home / Self Care | Payer: Self-pay | Source: Ambulatory Visit | Attending: Cardiovascular Disease | Admitting: Cardiovascular Disease

## 2016-01-14 ENCOUNTER — Other Ambulatory Visit: Payer: Self-pay | Admitting: Cardiovascular Disease

## 2016-01-14 ENCOUNTER — Encounter (HOSPITAL_COMMUNITY)
Admission: RE | Admit: 2016-01-14 | Discharge: 2016-01-14 | Disposition: A | Discharge status: Home / Self Care | Payer: Self-pay | Source: Ambulatory Visit | Attending: Cardiovascular Disease | Admitting: Cardiovascular Disease

## 2016-01-14 NOTE — Telephone Encounter (Signed)
Rx(s) sent to pharmacy electronically.  

## 2016-01-16 ENCOUNTER — Encounter (HOSPITAL_COMMUNITY)
Admission: RE | Admit: 2016-01-16 | Discharge: 2016-01-16 | Disposition: A | Discharge status: Home / Self Care | Payer: Self-pay | Source: Ambulatory Visit | Attending: Cardiovascular Disease | Admitting: Cardiovascular Disease

## 2016-01-18 ENCOUNTER — Encounter (HOSPITAL_COMMUNITY)
Admission: RE | Admit: 2016-01-18 | Discharge: 2016-01-18 | Disposition: A | Discharge status: Home / Self Care | Payer: Self-pay | Source: Ambulatory Visit | Attending: Cardiovascular Disease | Admitting: Cardiovascular Disease

## 2016-01-21 ENCOUNTER — Encounter (HOSPITAL_COMMUNITY)
Admission: RE | Admit: 2016-01-21 | Discharge: 2016-01-21 | Disposition: A | Discharge status: Home / Self Care | Payer: Self-pay | Source: Ambulatory Visit | Attending: Cardiovascular Disease | Admitting: Cardiovascular Disease

## 2016-01-21 DIAGNOSIS — H401131 Primary open-angle glaucoma, bilateral, mild stage: Secondary | ICD-10-CM | POA: Diagnosis not present

## 2016-01-21 DIAGNOSIS — H019 Unspecified inflammation of eyelid: Secondary | ICD-10-CM | POA: Diagnosis not present

## 2016-01-21 DIAGNOSIS — H02403 Unspecified ptosis of bilateral eyelids: Secondary | ICD-10-CM | POA: Diagnosis not present

## 2016-01-23 ENCOUNTER — Encounter (HOSPITAL_COMMUNITY): Payer: Self-pay

## 2016-01-25 ENCOUNTER — Encounter (HOSPITAL_COMMUNITY): Payer: Self-pay

## 2016-01-28 ENCOUNTER — Encounter (HOSPITAL_COMMUNITY)
Admission: RE | Admit: 2016-01-28 | Discharge: 2016-01-28 | Disposition: A | Discharge status: Home / Self Care | Payer: Self-pay | Source: Ambulatory Visit | Attending: Cardiovascular Disease | Admitting: Cardiovascular Disease

## 2016-01-31 DIAGNOSIS — Z23 Encounter for immunization: Secondary | ICD-10-CM | POA: Diagnosis not present

## 2016-02-04 ENCOUNTER — Encounter (HOSPITAL_COMMUNITY)
Admission: RE | Admit: 2016-02-04 | Discharge: 2016-02-04 | Disposition: A | Discharge status: Home / Self Care | Payer: Self-pay | Source: Ambulatory Visit | Attending: Cardiovascular Disease | Admitting: Cardiovascular Disease

## 2016-02-04 DIAGNOSIS — I252 Old myocardial infarction: Secondary | ICD-10-CM | POA: Insufficient documentation

## 2016-02-08 ENCOUNTER — Encounter (HOSPITAL_COMMUNITY)
Admission: RE | Admit: 2016-02-08 | Discharge: 2016-02-08 | Disposition: A | Discharge status: Home / Self Care | Payer: Self-pay | Source: Ambulatory Visit | Attending: Cardiovascular Disease | Admitting: Cardiovascular Disease

## 2016-02-13 ENCOUNTER — Encounter (HOSPITAL_COMMUNITY)
Admission: RE | Admit: 2016-02-13 | Discharge: 2016-02-13 | Disposition: A | Discharge status: Home / Self Care | Payer: Self-pay | Source: Ambulatory Visit | Attending: Cardiovascular Disease | Admitting: Cardiovascular Disease

## 2016-02-15 ENCOUNTER — Encounter (HOSPITAL_COMMUNITY)
Admission: RE | Admit: 2016-02-15 | Discharge: 2016-02-15 | Disposition: A | Discharge status: Home / Self Care | Payer: Self-pay | Source: Ambulatory Visit | Attending: Cardiovascular Disease | Admitting: Cardiovascular Disease

## 2016-02-20 ENCOUNTER — Encounter (HOSPITAL_COMMUNITY)
Admission: RE | Admit: 2016-02-20 | Discharge: 2016-02-20 | Disposition: A | Discharge status: Home / Self Care | Payer: Self-pay | Source: Ambulatory Visit | Attending: Cardiovascular Disease | Admitting: Cardiovascular Disease

## 2016-02-20 ENCOUNTER — Telehealth: Payer: Self-pay

## 2016-02-20 NOTE — Telephone Encounter (Signed)
Faxed signed maintenance program from to cardiac rehab on 02/13/16.

## 2016-02-25 ENCOUNTER — Encounter (HOSPITAL_COMMUNITY)
Admission: RE | Admit: 2016-02-25 | Discharge: 2016-02-25 | Disposition: A | Discharge status: Home / Self Care | Payer: Self-pay | Source: Ambulatory Visit | Attending: Cardiovascular Disease | Admitting: Cardiovascular Disease

## 2016-02-29 ENCOUNTER — Encounter (HOSPITAL_COMMUNITY): Payer: Self-pay

## 2016-02-29 DIAGNOSIS — I252 Old myocardial infarction: Secondary | ICD-10-CM | POA: Insufficient documentation

## 2016-03-03 ENCOUNTER — Encounter (HOSPITAL_COMMUNITY)
Admission: RE | Admit: 2016-03-03 | Discharge: 2016-03-03 | Disposition: A | Discharge status: Home / Self Care | Payer: Self-pay | Source: Ambulatory Visit | Attending: Cardiovascular Disease | Admitting: Cardiovascular Disease

## 2016-03-04 DIAGNOSIS — I119 Hypertensive heart disease without heart failure: Secondary | ICD-10-CM | POA: Diagnosis not present

## 2016-03-04 DIAGNOSIS — I4891 Unspecified atrial fibrillation: Secondary | ICD-10-CM | POA: Diagnosis not present

## 2016-03-04 DIAGNOSIS — E669 Obesity, unspecified: Secondary | ICD-10-CM | POA: Diagnosis not present

## 2016-03-04 DIAGNOSIS — I2510 Atherosclerotic heart disease of native coronary artery without angina pectoris: Secondary | ICD-10-CM | POA: Diagnosis not present

## 2016-03-04 DIAGNOSIS — E7800 Pure hypercholesterolemia, unspecified: Secondary | ICD-10-CM | POA: Diagnosis not present

## 2016-03-04 DIAGNOSIS — I429 Cardiomyopathy, unspecified: Secondary | ICD-10-CM | POA: Diagnosis not present

## 2016-03-05 ENCOUNTER — Encounter (HOSPITAL_COMMUNITY): Payer: Self-pay

## 2016-03-07 ENCOUNTER — Encounter (HOSPITAL_COMMUNITY)
Admission: RE | Admit: 2016-03-07 | Discharge: 2016-03-07 | Disposition: A | Discharge status: Home / Self Care | Payer: Self-pay | Source: Ambulatory Visit | Attending: Cardiovascular Disease | Admitting: Cardiovascular Disease

## 2016-03-10 ENCOUNTER — Encounter (HOSPITAL_COMMUNITY): Payer: Self-pay

## 2016-03-12 ENCOUNTER — Encounter (HOSPITAL_COMMUNITY): Payer: Self-pay

## 2016-03-14 ENCOUNTER — Encounter (HOSPITAL_COMMUNITY)
Admission: RE | Admit: 2016-03-14 | Discharge: 2016-03-14 | Disposition: A | Discharge status: Home / Self Care | Payer: Self-pay | Source: Ambulatory Visit | Attending: Cardiovascular Disease | Admitting: Cardiovascular Disease

## 2016-03-17 ENCOUNTER — Encounter (HOSPITAL_COMMUNITY)
Admission: RE | Admit: 2016-03-17 | Discharge: 2016-03-17 | Disposition: A | Discharge status: Home / Self Care | Payer: Self-pay | Source: Ambulatory Visit | Attending: Cardiovascular Disease | Admitting: Cardiovascular Disease

## 2016-03-19 ENCOUNTER — Encounter (HOSPITAL_COMMUNITY)
Admission: RE | Admit: 2016-03-19 | Discharge: 2016-03-19 | Disposition: A | Discharge status: Home / Self Care | Payer: Self-pay | Source: Ambulatory Visit | Attending: Cardiovascular Disease | Admitting: Cardiovascular Disease

## 2016-03-21 ENCOUNTER — Encounter (HOSPITAL_COMMUNITY): Payer: Self-pay

## 2016-03-26 ENCOUNTER — Encounter (HOSPITAL_COMMUNITY): Payer: Self-pay

## 2016-03-28 ENCOUNTER — Encounter (HOSPITAL_COMMUNITY): Payer: Self-pay

## 2016-04-02 ENCOUNTER — Encounter (HOSPITAL_COMMUNITY): Payer: Self-pay

## 2016-04-02 DIAGNOSIS — I252 Old myocardial infarction: Secondary | ICD-10-CM | POA: Insufficient documentation

## 2016-04-04 ENCOUNTER — Encounter (HOSPITAL_COMMUNITY): Payer: Self-pay

## 2016-04-07 ENCOUNTER — Encounter (HOSPITAL_COMMUNITY)
Admission: RE | Admit: 2016-04-07 | Discharge: 2016-04-07 | Disposition: A | Discharge status: Home / Self Care | Payer: Self-pay | Source: Ambulatory Visit | Attending: Cardiovascular Disease | Admitting: Cardiovascular Disease

## 2016-04-08 ENCOUNTER — Other Ambulatory Visit: Payer: Self-pay | Admitting: Family Medicine

## 2016-04-08 DIAGNOSIS — Z1231 Encounter for screening mammogram for malignant neoplasm of breast: Secondary | ICD-10-CM

## 2016-04-09 ENCOUNTER — Encounter (HOSPITAL_COMMUNITY)
Admission: RE | Admit: 2016-04-09 | Discharge: 2016-04-09 | Disposition: A | Discharge status: Home / Self Care | Payer: Self-pay | Source: Ambulatory Visit | Attending: Cardiovascular Disease | Admitting: Cardiovascular Disease

## 2016-04-11 ENCOUNTER — Encounter (HOSPITAL_COMMUNITY)
Admission: RE | Admit: 2016-04-11 | Discharge: 2016-04-11 | Disposition: A | Discharge status: Home / Self Care | Payer: Self-pay | Source: Ambulatory Visit | Attending: Cardiovascular Disease | Admitting: Cardiovascular Disease

## 2016-04-14 ENCOUNTER — Encounter (HOSPITAL_COMMUNITY)
Admission: RE | Admit: 2016-04-14 | Discharge: 2016-04-14 | Disposition: A | Discharge status: Home / Self Care | Payer: Self-pay | Source: Ambulatory Visit | Attending: Cardiovascular Disease | Admitting: Cardiovascular Disease

## 2016-04-16 ENCOUNTER — Encounter (HOSPITAL_COMMUNITY): Payer: Self-pay

## 2016-04-18 ENCOUNTER — Telehealth: Payer: Self-pay

## 2016-04-18 ENCOUNTER — Encounter (HOSPITAL_COMMUNITY): Payer: Self-pay

## 2016-04-18 NOTE — Telephone Encounter (Signed)
Opened encounter in error  

## 2016-04-18 NOTE — Telephone Encounter (Signed)
Signed physician orders for maintenance program faxed to [facility] at [facility] on 04/10/16.

## 2016-04-21 ENCOUNTER — Encounter (HOSPITAL_COMMUNITY)
Admission: RE | Admit: 2016-04-21 | Discharge: 2016-04-21 | Disposition: A | Discharge status: Home / Self Care | Payer: Self-pay | Source: Ambulatory Visit | Attending: Cardiovascular Disease | Admitting: Cardiovascular Disease

## 2016-04-23 ENCOUNTER — Encounter (HOSPITAL_COMMUNITY): Payer: Self-pay

## 2016-04-24 DIAGNOSIS — H353212 Exudative age-related macular degeneration, right eye, with inactive choroidal neovascularization: Secondary | ICD-10-CM | POA: Diagnosis not present

## 2016-04-24 DIAGNOSIS — H353132 Nonexudative age-related macular degeneration, bilateral, intermediate dry stage: Secondary | ICD-10-CM | POA: Diagnosis not present

## 2016-04-24 DIAGNOSIS — H35362 Drusen (degenerative) of macula, left eye: Secondary | ICD-10-CM | POA: Diagnosis not present

## 2016-04-24 DIAGNOSIS — H35363 Drusen (degenerative) of macula, bilateral: Secondary | ICD-10-CM | POA: Diagnosis not present

## 2016-04-25 ENCOUNTER — Encounter (HOSPITAL_COMMUNITY)
Admission: RE | Admit: 2016-04-25 | Discharge: 2016-04-25 | Disposition: A | Discharge status: Home / Self Care | Payer: Self-pay | Source: Ambulatory Visit | Attending: Cardiovascular Disease | Admitting: Cardiovascular Disease

## 2016-04-28 ENCOUNTER — Encounter (HOSPITAL_COMMUNITY)
Admission: RE | Admit: 2016-04-28 | Discharge: 2016-04-28 | Disposition: A | Discharge status: Home / Self Care | Payer: Self-pay | Source: Ambulatory Visit | Attending: Cardiovascular Disease | Admitting: Cardiovascular Disease

## 2016-04-30 ENCOUNTER — Encounter (HOSPITAL_COMMUNITY)
Admission: RE | Admit: 2016-04-30 | Discharge: 2016-04-30 | Disposition: A | Discharge status: Home / Self Care | Payer: Self-pay | Source: Ambulatory Visit | Attending: Cardiovascular Disease | Admitting: Cardiovascular Disease

## 2016-05-01 ENCOUNTER — Ambulatory Visit
Admission: RE | Admit: 2016-05-01 | Discharge: 2016-05-01 | Disposition: A | Discharge status: Home / Self Care | Payer: Medicare Other | Source: Ambulatory Visit | Attending: Family Medicine | Admitting: Family Medicine

## 2016-05-01 DIAGNOSIS — Z1231 Encounter for screening mammogram for malignant neoplasm of breast: Secondary | ICD-10-CM | POA: Diagnosis not present

## 2016-05-02 ENCOUNTER — Encounter (HOSPITAL_COMMUNITY): Payer: Self-pay

## 2016-05-02 DIAGNOSIS — I252 Old myocardial infarction: Secondary | ICD-10-CM | POA: Insufficient documentation

## 2016-05-05 ENCOUNTER — Encounter (HOSPITAL_COMMUNITY)
Admission: RE | Admit: 2016-05-05 | Discharge: 2016-05-05 | Disposition: A | Discharge status: Home / Self Care | Payer: Self-pay | Source: Ambulatory Visit | Attending: Cardiovascular Disease | Admitting: Cardiovascular Disease

## 2016-05-07 ENCOUNTER — Encounter (HOSPITAL_COMMUNITY): Payer: Self-pay

## 2016-05-09 ENCOUNTER — Encounter (HOSPITAL_COMMUNITY)
Admission: RE | Admit: 2016-05-09 | Discharge: 2016-05-09 | Disposition: A | Discharge status: Home / Self Care | Payer: Self-pay | Source: Ambulatory Visit | Attending: Cardiovascular Disease | Admitting: Cardiovascular Disease

## 2016-05-12 ENCOUNTER — Encounter (HOSPITAL_COMMUNITY)
Admission: RE | Admit: 2016-05-12 | Discharge: 2016-05-12 | Disposition: A | Discharge status: Home / Self Care | Payer: Self-pay | Source: Ambulatory Visit | Attending: Cardiovascular Disease | Admitting: Cardiovascular Disease

## 2016-05-14 ENCOUNTER — Encounter (HOSPITAL_COMMUNITY): Payer: Self-pay

## 2016-05-16 ENCOUNTER — Encounter (HOSPITAL_COMMUNITY)
Admission: RE | Admit: 2016-05-16 | Discharge: 2016-05-16 | Disposition: A | Discharge status: Home / Self Care | Payer: Self-pay | Source: Ambulatory Visit | Attending: Cardiovascular Disease | Admitting: Cardiovascular Disease

## 2016-05-19 ENCOUNTER — Encounter (HOSPITAL_COMMUNITY)
Admission: RE | Admit: 2016-05-19 | Discharge: 2016-05-19 | Disposition: A | Discharge status: Home / Self Care | Payer: Self-pay | Source: Ambulatory Visit | Attending: Cardiovascular Disease | Admitting: Cardiovascular Disease

## 2016-05-21 ENCOUNTER — Encounter (HOSPITAL_COMMUNITY): Payer: Self-pay

## 2016-05-23 ENCOUNTER — Encounter (HOSPITAL_COMMUNITY)
Admission: RE | Admit: 2016-05-23 | Discharge: 2016-05-23 | Disposition: A | Discharge status: Home / Self Care | Payer: Self-pay | Source: Ambulatory Visit | Attending: Cardiovascular Disease | Admitting: Cardiovascular Disease

## 2016-05-26 ENCOUNTER — Encounter (HOSPITAL_COMMUNITY)
Admission: RE | Admit: 2016-05-26 | Discharge: 2016-05-26 | Disposition: A | Discharge status: Home / Self Care | Payer: Self-pay | Source: Ambulatory Visit | Attending: Cardiovascular Disease | Admitting: Cardiovascular Disease

## 2016-05-28 ENCOUNTER — Encounter (HOSPITAL_COMMUNITY)
Admission: RE | Admit: 2016-05-28 | Discharge: 2016-05-28 | Disposition: A | Discharge status: Home / Self Care | Payer: Self-pay | Source: Ambulatory Visit | Attending: Cardiovascular Disease | Admitting: Cardiovascular Disease

## 2016-05-30 ENCOUNTER — Encounter (HOSPITAL_COMMUNITY): Payer: Self-pay

## 2016-05-30 DIAGNOSIS — I252 Old myocardial infarction: Secondary | ICD-10-CM | POA: Insufficient documentation

## 2016-06-02 ENCOUNTER — Encounter (HOSPITAL_COMMUNITY)
Admission: RE | Admit: 2016-06-02 | Discharge: 2016-06-02 | Disposition: A | Discharge status: Home / Self Care | Payer: Self-pay | Source: Ambulatory Visit | Attending: Cardiovascular Disease | Admitting: Cardiovascular Disease

## 2016-06-04 ENCOUNTER — Encounter (HOSPITAL_COMMUNITY): Payer: Self-pay

## 2016-06-06 ENCOUNTER — Encounter (HOSPITAL_COMMUNITY)
Admission: RE | Admit: 2016-06-06 | Discharge: 2016-06-06 | Disposition: A | Discharge status: Home / Self Care | Payer: Self-pay | Source: Ambulatory Visit | Attending: Cardiovascular Disease | Admitting: Cardiovascular Disease

## 2016-06-09 ENCOUNTER — Encounter (HOSPITAL_COMMUNITY): Payer: Self-pay

## 2016-06-11 ENCOUNTER — Encounter (HOSPITAL_COMMUNITY)
Admission: RE | Admit: 2016-06-11 | Discharge: 2016-06-11 | Disposition: A | Discharge status: Home / Self Care | Payer: Self-pay | Source: Ambulatory Visit | Attending: Cardiovascular Disease | Admitting: Cardiovascular Disease

## 2016-06-12 ENCOUNTER — Other Ambulatory Visit: Payer: Self-pay | Admitting: Cardiovascular Disease

## 2016-06-13 ENCOUNTER — Encounter (HOSPITAL_COMMUNITY)
Admission: RE | Admit: 2016-06-13 | Discharge: 2016-06-13 | Disposition: A | Discharge status: Home / Self Care | Payer: Self-pay | Source: Ambulatory Visit | Attending: Cardiovascular Disease | Admitting: Cardiovascular Disease

## 2016-06-16 ENCOUNTER — Encounter (HOSPITAL_COMMUNITY)
Admission: RE | Admit: 2016-06-16 | Discharge: 2016-06-16 | Disposition: A | Discharge status: Home / Self Care | Payer: Self-pay | Source: Ambulatory Visit | Attending: Cardiovascular Disease | Admitting: Cardiovascular Disease

## 2016-06-18 ENCOUNTER — Encounter (HOSPITAL_COMMUNITY): Payer: Self-pay

## 2016-06-20 ENCOUNTER — Encounter (HOSPITAL_COMMUNITY)
Admission: RE | Admit: 2016-06-20 | Discharge: 2016-06-20 | Disposition: A | Discharge status: Home / Self Care | Payer: Self-pay | Source: Ambulatory Visit | Attending: Cardiovascular Disease | Admitting: Cardiovascular Disease

## 2016-06-23 ENCOUNTER — Encounter (HOSPITAL_COMMUNITY): Payer: Self-pay

## 2016-06-25 ENCOUNTER — Encounter (HOSPITAL_COMMUNITY)
Admission: RE | Admit: 2016-06-25 | Discharge: 2016-06-25 | Disposition: A | Discharge status: Home / Self Care | Payer: Self-pay | Source: Ambulatory Visit | Attending: Cardiovascular Disease | Admitting: Cardiovascular Disease

## 2016-06-27 ENCOUNTER — Encounter (HOSPITAL_COMMUNITY)
Admission: RE | Admit: 2016-06-27 | Discharge: 2016-06-27 | Disposition: A | Discharge status: Home / Self Care | Payer: Self-pay | Source: Ambulatory Visit | Attending: Cardiovascular Disease | Admitting: Cardiovascular Disease

## 2016-06-30 ENCOUNTER — Encounter (HOSPITAL_COMMUNITY)
Admission: RE | Admit: 2016-06-30 | Discharge: 2016-06-30 | Disposition: A | Discharge status: Home / Self Care | Payer: Self-pay | Source: Ambulatory Visit | Attending: Cardiovascular Disease | Admitting: Cardiovascular Disease

## 2016-06-30 DIAGNOSIS — I252 Old myocardial infarction: Secondary | ICD-10-CM | POA: Insufficient documentation

## 2016-07-02 ENCOUNTER — Encounter (HOSPITAL_COMMUNITY): Payer: Self-pay

## 2016-07-04 ENCOUNTER — Encounter (HOSPITAL_COMMUNITY): Payer: Self-pay

## 2016-07-07 ENCOUNTER — Encounter (HOSPITAL_COMMUNITY)
Admission: RE | Admit: 2016-07-07 | Discharge: 2016-07-07 | Disposition: A | Discharge status: Home / Self Care | Payer: Self-pay | Source: Ambulatory Visit | Attending: Cardiovascular Disease | Admitting: Cardiovascular Disease

## 2016-07-09 ENCOUNTER — Encounter (HOSPITAL_COMMUNITY)
Admission: RE | Admit: 2016-07-09 | Discharge: 2016-07-09 | Disposition: A | Discharge status: Home / Self Care | Payer: Self-pay | Source: Ambulatory Visit | Attending: Cardiovascular Disease | Admitting: Cardiovascular Disease

## 2016-07-11 ENCOUNTER — Encounter (HOSPITAL_COMMUNITY)
Admission: RE | Admit: 2016-07-11 | Discharge: 2016-07-11 | Disposition: A | Discharge status: Home / Self Care | Payer: Self-pay | Source: Ambulatory Visit | Attending: Cardiovascular Disease | Admitting: Cardiovascular Disease

## 2016-07-14 ENCOUNTER — Encounter (HOSPITAL_COMMUNITY)
Admission: RE | Admit: 2016-07-14 | Discharge: 2016-07-14 | Disposition: A | Discharge status: Home / Self Care | Payer: Self-pay | Source: Ambulatory Visit | Attending: Cardiovascular Disease | Admitting: Cardiovascular Disease

## 2016-07-16 ENCOUNTER — Encounter (HOSPITAL_COMMUNITY): Payer: Self-pay

## 2016-07-18 ENCOUNTER — Encounter (HOSPITAL_COMMUNITY)
Admission: RE | Admit: 2016-07-18 | Discharge: 2016-07-18 | Disposition: A | Discharge status: Home / Self Care | Payer: Self-pay | Source: Ambulatory Visit | Attending: Cardiovascular Disease | Admitting: Cardiovascular Disease

## 2016-07-21 ENCOUNTER — Encounter (HOSPITAL_COMMUNITY): Payer: Self-pay

## 2016-07-23 ENCOUNTER — Encounter (HOSPITAL_COMMUNITY)
Admission: RE | Admit: 2016-07-23 | Discharge: 2016-07-23 | Disposition: A | Discharge status: Home / Self Care | Payer: Self-pay | Source: Ambulatory Visit | Attending: Cardiovascular Disease | Admitting: Cardiovascular Disease

## 2016-07-25 ENCOUNTER — Encounter (HOSPITAL_COMMUNITY)
Admission: RE | Admit: 2016-07-25 | Discharge: 2016-07-25 | Disposition: A | Discharge status: Home / Self Care | Payer: Self-pay | Source: Ambulatory Visit | Attending: Cardiovascular Disease | Admitting: Cardiovascular Disease

## 2016-07-28 ENCOUNTER — Encounter (HOSPITAL_COMMUNITY)
Admission: RE | Admit: 2016-07-28 | Discharge: 2016-07-28 | Disposition: A | Discharge status: Home / Self Care | Payer: Self-pay | Source: Ambulatory Visit | Attending: Cardiovascular Disease | Admitting: Cardiovascular Disease

## 2016-07-30 ENCOUNTER — Encounter (HOSPITAL_COMMUNITY): Payer: Self-pay

## 2016-07-30 DIAGNOSIS — I252 Old myocardial infarction: Secondary | ICD-10-CM | POA: Insufficient documentation

## 2016-08-01 ENCOUNTER — Encounter (HOSPITAL_COMMUNITY): Payer: Self-pay

## 2016-08-04 ENCOUNTER — Encounter (HOSPITAL_COMMUNITY)
Admission: RE | Admit: 2016-08-04 | Discharge: 2016-08-04 | Disposition: A | Discharge status: Home / Self Care | Payer: Self-pay | Source: Ambulatory Visit | Attending: Cardiovascular Disease | Admitting: Cardiovascular Disease

## 2016-08-06 ENCOUNTER — Encounter (HOSPITAL_COMMUNITY): Payer: Self-pay

## 2016-08-08 ENCOUNTER — Encounter (HOSPITAL_COMMUNITY)
Admission: RE | Admit: 2016-08-08 | Discharge: 2016-08-08 | Disposition: A | Discharge status: Home / Self Care | Payer: Self-pay | Source: Ambulatory Visit | Attending: Cardiovascular Disease | Admitting: Cardiovascular Disease

## 2016-08-11 ENCOUNTER — Encounter (HOSPITAL_COMMUNITY)
Admission: RE | Admit: 2016-08-11 | Discharge: 2016-08-11 | Disposition: A | Discharge status: Home / Self Care | Payer: Self-pay | Source: Ambulatory Visit | Attending: Cardiovascular Disease | Admitting: Cardiovascular Disease

## 2016-08-13 ENCOUNTER — Encounter (HOSPITAL_COMMUNITY): Payer: Self-pay

## 2016-08-15 ENCOUNTER — Encounter (HOSPITAL_COMMUNITY)
Admission: RE | Admit: 2016-08-15 | Discharge: 2016-08-15 | Disposition: A | Discharge status: Home / Self Care | Payer: Self-pay | Source: Ambulatory Visit | Attending: Cardiovascular Disease | Admitting: Cardiovascular Disease

## 2016-08-18 ENCOUNTER — Encounter (HOSPITAL_COMMUNITY)
Admission: RE | Admit: 2016-08-18 | Discharge: 2016-08-18 | Disposition: A | Discharge status: Home / Self Care | Payer: Self-pay | Source: Ambulatory Visit | Attending: Cardiovascular Disease | Admitting: Cardiovascular Disease

## 2016-08-20 ENCOUNTER — Encounter (HOSPITAL_COMMUNITY)
Admission: RE | Admit: 2016-08-20 | Discharge: 2016-08-20 | Disposition: A | Discharge status: Home / Self Care | Payer: Self-pay | Source: Ambulatory Visit | Attending: Cardiovascular Disease | Admitting: Cardiovascular Disease

## 2016-08-22 ENCOUNTER — Encounter (HOSPITAL_COMMUNITY)
Admission: RE | Admit: 2016-08-22 | Discharge: 2016-08-22 | Disposition: A | Discharge status: Home / Self Care | Payer: Self-pay | Source: Ambulatory Visit | Attending: Cardiovascular Disease | Admitting: Cardiovascular Disease

## 2016-08-27 ENCOUNTER — Encounter (HOSPITAL_COMMUNITY)
Admission: RE | Admit: 2016-08-27 | Discharge: 2016-08-27 | Disposition: A | Discharge status: Home / Self Care | Payer: Self-pay | Source: Ambulatory Visit | Attending: Cardiovascular Disease | Admitting: Cardiovascular Disease

## 2016-08-29 ENCOUNTER — Encounter (HOSPITAL_COMMUNITY): Payer: Self-pay

## 2016-08-29 DIAGNOSIS — I252 Old myocardial infarction: Secondary | ICD-10-CM | POA: Insufficient documentation

## 2016-09-01 ENCOUNTER — Encounter (HOSPITAL_COMMUNITY)
Admission: RE | Admit: 2016-09-01 | Discharge: 2016-09-01 | Disposition: A | Discharge status: Home / Self Care | Payer: Self-pay | Source: Ambulatory Visit | Attending: Cardiovascular Disease | Admitting: Cardiovascular Disease

## 2016-09-03 ENCOUNTER — Encounter (HOSPITAL_COMMUNITY): Payer: Self-pay

## 2016-09-05 ENCOUNTER — Encounter (HOSPITAL_COMMUNITY)
Admission: RE | Admit: 2016-09-05 | Discharge: 2016-09-05 | Disposition: A | Discharge status: Home / Self Care | Payer: Self-pay | Source: Ambulatory Visit | Attending: Cardiovascular Disease | Admitting: Cardiovascular Disease

## 2016-09-08 ENCOUNTER — Encounter (HOSPITAL_COMMUNITY)
Admission: RE | Admit: 2016-09-08 | Discharge: 2016-09-08 | Disposition: A | Discharge status: Home / Self Care | Payer: Self-pay | Source: Ambulatory Visit | Attending: Cardiovascular Disease | Admitting: Cardiovascular Disease

## 2016-09-10 ENCOUNTER — Encounter (HOSPITAL_COMMUNITY): Payer: Self-pay

## 2016-09-12 ENCOUNTER — Encounter (HOSPITAL_COMMUNITY): Payer: Self-pay

## 2016-09-15 ENCOUNTER — Encounter (HOSPITAL_COMMUNITY): Payer: Self-pay

## 2016-09-17 ENCOUNTER — Encounter (HOSPITAL_COMMUNITY): Payer: Self-pay

## 2016-09-17 DIAGNOSIS — I4891 Unspecified atrial fibrillation: Secondary | ICD-10-CM | POA: Diagnosis not present

## 2016-09-17 DIAGNOSIS — I119 Hypertensive heart disease without heart failure: Secondary | ICD-10-CM | POA: Diagnosis not present

## 2016-09-17 DIAGNOSIS — Z1389 Encounter for screening for other disorder: Secondary | ICD-10-CM | POA: Diagnosis not present

## 2016-09-17 DIAGNOSIS — I2510 Atherosclerotic heart disease of native coronary artery without angina pectoris: Secondary | ICD-10-CM | POA: Diagnosis not present

## 2016-09-17 DIAGNOSIS — E669 Obesity, unspecified: Secondary | ICD-10-CM | POA: Diagnosis not present

## 2016-09-17 DIAGNOSIS — Z0000 Encounter for general adult medical examination without abnormal findings: Secondary | ICD-10-CM | POA: Diagnosis not present

## 2016-09-17 DIAGNOSIS — M8588 Other specified disorders of bone density and structure, other site: Secondary | ICD-10-CM | POA: Diagnosis not present

## 2016-09-17 DIAGNOSIS — F39 Unspecified mood [affective] disorder: Secondary | ICD-10-CM | POA: Diagnosis not present

## 2016-09-17 DIAGNOSIS — E559 Vitamin D deficiency, unspecified: Secondary | ICD-10-CM | POA: Diagnosis not present

## 2016-09-17 DIAGNOSIS — J301 Allergic rhinitis due to pollen: Secondary | ICD-10-CM | POA: Diagnosis not present

## 2016-09-17 DIAGNOSIS — E7800 Pure hypercholesterolemia, unspecified: Secondary | ICD-10-CM | POA: Diagnosis not present

## 2016-09-17 DIAGNOSIS — I429 Cardiomyopathy, unspecified: Secondary | ICD-10-CM | POA: Diagnosis not present

## 2016-09-19 ENCOUNTER — Encounter (HOSPITAL_COMMUNITY)
Admission: RE | Admit: 2016-09-19 | Discharge: 2016-09-19 | Disposition: A | Discharge status: Home / Self Care | Payer: Self-pay | Source: Ambulatory Visit | Attending: Cardiovascular Disease | Admitting: Cardiovascular Disease

## 2016-09-22 ENCOUNTER — Encounter (HOSPITAL_COMMUNITY)
Admission: RE | Admit: 2016-09-22 | Discharge: 2016-09-22 | Disposition: A | Discharge status: Home / Self Care | Payer: Self-pay | Source: Ambulatory Visit | Attending: Cardiovascular Disease | Admitting: Cardiovascular Disease

## 2016-09-24 ENCOUNTER — Encounter (HOSPITAL_COMMUNITY): Payer: Self-pay

## 2016-09-26 ENCOUNTER — Encounter (HOSPITAL_COMMUNITY)
Admission: RE | Admit: 2016-09-26 | Discharge: 2016-09-26 | Disposition: A | Discharge status: Home / Self Care | Payer: Self-pay | Source: Ambulatory Visit | Attending: Cardiovascular Disease | Admitting: Cardiovascular Disease

## 2016-09-29 ENCOUNTER — Encounter (HOSPITAL_COMMUNITY)
Admission: RE | Admit: 2016-09-29 | Discharge: 2016-09-29 | Disposition: A | Discharge status: Home / Self Care | Payer: Self-pay | Source: Ambulatory Visit | Attending: Cardiovascular Disease | Admitting: Cardiovascular Disease

## 2016-09-29 DIAGNOSIS — I219 Acute myocardial infarction, unspecified: Secondary | ICD-10-CM | POA: Insufficient documentation

## 2016-10-03 ENCOUNTER — Encounter (HOSPITAL_COMMUNITY): Payer: Self-pay

## 2016-10-06 ENCOUNTER — Encounter (HOSPITAL_COMMUNITY)
Admission: RE | Admit: 2016-10-06 | Discharge: 2016-10-06 | Disposition: A | Discharge status: Home / Self Care | Payer: Self-pay | Source: Ambulatory Visit | Attending: Cardiovascular Disease | Admitting: Cardiovascular Disease

## 2016-10-08 ENCOUNTER — Encounter (HOSPITAL_COMMUNITY): Payer: Self-pay

## 2016-10-10 ENCOUNTER — Encounter (HOSPITAL_COMMUNITY)
Admission: RE | Admit: 2016-10-10 | Discharge: 2016-10-10 | Disposition: A | Discharge status: Home / Self Care | Payer: Self-pay | Source: Ambulatory Visit | Attending: Cardiovascular Disease | Admitting: Cardiovascular Disease

## 2016-10-13 ENCOUNTER — Encounter (HOSPITAL_COMMUNITY)
Admission: RE | Admit: 2016-10-13 | Discharge: 2016-10-13 | Disposition: A | Discharge status: Home / Self Care | Payer: Self-pay | Source: Ambulatory Visit | Attending: Cardiovascular Disease | Admitting: Cardiovascular Disease

## 2016-10-15 ENCOUNTER — Encounter (HOSPITAL_COMMUNITY): Payer: Self-pay

## 2016-10-17 ENCOUNTER — Encounter (HOSPITAL_COMMUNITY)
Admission: RE | Admit: 2016-10-17 | Discharge: 2016-10-17 | Disposition: A | Discharge status: Home / Self Care | Payer: Self-pay | Source: Ambulatory Visit | Attending: Cardiovascular Disease | Admitting: Cardiovascular Disease

## 2016-10-20 ENCOUNTER — Encounter (HOSPITAL_COMMUNITY)
Admission: RE | Admit: 2016-10-20 | Discharge: 2016-10-20 | Disposition: A | Discharge status: Home / Self Care | Payer: Self-pay | Source: Ambulatory Visit | Attending: Cardiovascular Disease | Admitting: Cardiovascular Disease

## 2016-10-22 ENCOUNTER — Encounter (HOSPITAL_COMMUNITY): Payer: Self-pay

## 2016-10-23 DIAGNOSIS — H401131 Primary open-angle glaucoma, bilateral, mild stage: Secondary | ICD-10-CM | POA: Diagnosis not present

## 2016-10-23 DIAGNOSIS — H40051 Ocular hypertension, right eye: Secondary | ICD-10-CM | POA: Diagnosis not present

## 2016-10-23 DIAGNOSIS — H35373 Puckering of macula, bilateral: Secondary | ICD-10-CM | POA: Diagnosis not present

## 2016-10-23 DIAGNOSIS — H43813 Vitreous degeneration, bilateral: Secondary | ICD-10-CM | POA: Diagnosis not present

## 2016-10-24 ENCOUNTER — Encounter (HOSPITAL_COMMUNITY)
Admission: RE | Admit: 2016-10-24 | Discharge: 2016-10-24 | Disposition: A | Discharge status: Home / Self Care | Payer: Self-pay | Source: Ambulatory Visit | Attending: Cardiovascular Disease | Admitting: Cardiovascular Disease

## 2016-10-27 ENCOUNTER — Encounter (HOSPITAL_COMMUNITY): Payer: Self-pay

## 2016-10-29 ENCOUNTER — Encounter (HOSPITAL_COMMUNITY): Payer: Self-pay

## 2016-10-29 DIAGNOSIS — I219 Acute myocardial infarction, unspecified: Secondary | ICD-10-CM | POA: Insufficient documentation

## 2016-10-31 ENCOUNTER — Encounter (HOSPITAL_COMMUNITY): Payer: Self-pay

## 2016-11-03 ENCOUNTER — Encounter (HOSPITAL_COMMUNITY)
Admission: RE | Admit: 2016-11-03 | Discharge: 2016-11-03 | Disposition: A | Discharge status: Home / Self Care | Payer: Medicare Other | Source: Ambulatory Visit | Attending: Cardiovascular Disease | Admitting: Cardiovascular Disease

## 2016-11-05 ENCOUNTER — Encounter (HOSPITAL_COMMUNITY)
Admission: RE | Admit: 2016-11-05 | Discharge: 2016-11-05 | Disposition: A | Discharge status: Home / Self Care | Payer: Self-pay | Source: Ambulatory Visit | Attending: Cardiovascular Disease | Admitting: Cardiovascular Disease

## 2016-11-07 ENCOUNTER — Encounter (HOSPITAL_COMMUNITY)
Admission: RE | Admit: 2016-11-07 | Discharge: 2016-11-07 | Disposition: A | Discharge status: Home / Self Care | Payer: Self-pay | Source: Ambulatory Visit | Attending: Cardiovascular Disease | Admitting: Cardiovascular Disease

## 2016-11-10 ENCOUNTER — Encounter (HOSPITAL_COMMUNITY)
Admission: RE | Admit: 2016-11-10 | Discharge: 2016-11-10 | Disposition: A | Discharge status: Home / Self Care | Payer: Self-pay | Source: Ambulatory Visit | Attending: Cardiovascular Disease | Admitting: Cardiovascular Disease

## 2016-11-12 ENCOUNTER — Encounter (HOSPITAL_COMMUNITY): Payer: Self-pay

## 2016-11-14 ENCOUNTER — Encounter (HOSPITAL_COMMUNITY)
Admission: RE | Admit: 2016-11-14 | Discharge: 2016-11-14 | Disposition: A | Discharge status: Home / Self Care | Payer: Self-pay | Source: Ambulatory Visit | Attending: Cardiovascular Disease | Admitting: Cardiovascular Disease

## 2016-11-16 ENCOUNTER — Other Ambulatory Visit: Payer: Self-pay | Admitting: Cardiovascular Disease

## 2016-11-17 ENCOUNTER — Encounter (HOSPITAL_COMMUNITY)
Admission: RE | Admit: 2016-11-17 | Discharge: 2016-11-17 | Disposition: A | Discharge status: Home / Self Care | Payer: Self-pay | Source: Ambulatory Visit | Attending: Cardiovascular Disease | Admitting: Cardiovascular Disease

## 2016-11-19 ENCOUNTER — Encounter (HOSPITAL_COMMUNITY): Payer: Self-pay

## 2016-11-21 ENCOUNTER — Encounter (HOSPITAL_COMMUNITY)
Admission: RE | Admit: 2016-11-21 | Discharge: 2016-11-21 | Disposition: A | Discharge status: Home / Self Care | Payer: Self-pay | Source: Ambulatory Visit | Attending: Cardiovascular Disease | Admitting: Cardiovascular Disease

## 2016-11-24 ENCOUNTER — Encounter (HOSPITAL_COMMUNITY)
Admission: RE | Admit: 2016-11-24 | Discharge: 2016-11-24 | Disposition: A | Discharge status: Home / Self Care | Payer: Self-pay | Source: Ambulatory Visit | Attending: Cardiovascular Disease | Admitting: Cardiovascular Disease

## 2016-11-26 ENCOUNTER — Encounter (HOSPITAL_COMMUNITY): Payer: Self-pay

## 2016-11-26 DIAGNOSIS — H40051 Ocular hypertension, right eye: Secondary | ICD-10-CM | POA: Diagnosis not present

## 2016-11-26 DIAGNOSIS — H35373 Puckering of macula, bilateral: Secondary | ICD-10-CM | POA: Diagnosis not present

## 2016-11-26 DIAGNOSIS — H43813 Vitreous degeneration, bilateral: Secondary | ICD-10-CM | POA: Diagnosis not present

## 2016-11-26 DIAGNOSIS — H401131 Primary open-angle glaucoma, bilateral, mild stage: Secondary | ICD-10-CM | POA: Diagnosis not present

## 2016-11-28 ENCOUNTER — Encounter (HOSPITAL_COMMUNITY)
Admission: RE | Admit: 2016-11-28 | Discharge: 2016-11-28 | Disposition: A | Discharge status: Home / Self Care | Payer: Self-pay | Source: Ambulatory Visit | Attending: Cardiovascular Disease | Admitting: Cardiovascular Disease

## 2016-12-03 ENCOUNTER — Encounter (HOSPITAL_COMMUNITY): Payer: Self-pay

## 2016-12-03 DIAGNOSIS — I219 Acute myocardial infarction, unspecified: Secondary | ICD-10-CM | POA: Insufficient documentation

## 2016-12-05 ENCOUNTER — Encounter (HOSPITAL_COMMUNITY): Payer: Self-pay

## 2016-12-08 ENCOUNTER — Encounter (HOSPITAL_COMMUNITY)
Admission: RE | Admit: 2016-12-08 | Discharge: 2016-12-08 | Disposition: A | Discharge status: Home / Self Care | Payer: Self-pay | Source: Ambulatory Visit | Attending: Cardiovascular Disease | Admitting: Cardiovascular Disease

## 2016-12-10 ENCOUNTER — Encounter (HOSPITAL_COMMUNITY)
Admission: RE | Admit: 2016-12-10 | Discharge: 2016-12-10 | Disposition: A | Discharge status: Home / Self Care | Payer: Self-pay | Source: Ambulatory Visit | Attending: Cardiovascular Disease | Admitting: Cardiovascular Disease

## 2016-12-12 ENCOUNTER — Encounter (HOSPITAL_COMMUNITY): Payer: Self-pay

## 2016-12-15 ENCOUNTER — Encounter (HOSPITAL_COMMUNITY)
Admission: RE | Admit: 2016-12-15 | Discharge: 2016-12-15 | Disposition: A | Discharge status: Home / Self Care | Payer: Self-pay | Source: Ambulatory Visit | Attending: Cardiovascular Disease | Admitting: Cardiovascular Disease

## 2016-12-15 ENCOUNTER — Other Ambulatory Visit: Payer: Self-pay | Admitting: Cardiovascular Disease

## 2016-12-17 ENCOUNTER — Encounter (HOSPITAL_COMMUNITY): Payer: Self-pay

## 2016-12-19 ENCOUNTER — Encounter (HOSPITAL_COMMUNITY)
Admission: RE | Admit: 2016-12-19 | Discharge: 2016-12-19 | Disposition: A | Discharge status: Home / Self Care | Payer: Self-pay | Source: Ambulatory Visit | Attending: Cardiovascular Disease | Admitting: Cardiovascular Disease

## 2016-12-22 ENCOUNTER — Encounter: Payer: Self-pay | Admitting: Cardiovascular Disease

## 2016-12-22 ENCOUNTER — Encounter (HOSPITAL_COMMUNITY)
Admission: RE | Admit: 2016-12-22 | Discharge: 2016-12-22 | Disposition: A | Discharge status: Home / Self Care | Payer: Self-pay | Source: Ambulatory Visit | Attending: Cardiovascular Disease | Admitting: Cardiovascular Disease

## 2016-12-22 ENCOUNTER — Ambulatory Visit (INDEPENDENT_AMBULATORY_CARE_PROVIDER_SITE_OTHER): Payer: Medicare Other | Admitting: Cardiovascular Disease

## 2016-12-22 VITALS — BP 160/80 | HR 63 | Ht 63.0 in | Wt 171.0 lb

## 2016-12-22 DIAGNOSIS — I10 Essential (primary) hypertension: Secondary | ICD-10-CM

## 2016-12-22 DIAGNOSIS — I25118 Atherosclerotic heart disease of native coronary artery with other forms of angina pectoris: Secondary | ICD-10-CM

## 2016-12-22 DIAGNOSIS — Z7901 Long term (current) use of anticoagulants: Secondary | ICD-10-CM

## 2016-12-22 DIAGNOSIS — E7800 Pure hypercholesterolemia, unspecified: Secondary | ICD-10-CM

## 2016-12-22 DIAGNOSIS — I480 Paroxysmal atrial fibrillation: Secondary | ICD-10-CM

## 2016-12-22 DIAGNOSIS — I209 Angina pectoris, unspecified: Secondary | ICD-10-CM

## 2016-12-22 MED ORDER — NITROGLYCERIN 0.4 MG SL SUBL: 0.4000 mg | SUBLINGUAL_TABLET | SUBLINGUAL | 3 refills | Status: DC | PRN

## 2016-12-22 MED ORDER — HYDROCHLOROTHIAZIDE 12.5 MG PO CAPS: 12.5000 mg | ORAL_CAPSULE | Freq: Every day | ORAL | 3 refills | Status: DC

## 2016-12-22 NOTE — Patient Instructions (Signed)
Dr Croitoru recommends that you schedule a follow-up appointment in 12 months. You will receive a reminder letter in the mail two months in advance. If you don't receive a letter, please call our office to schedule the follow-up appointment.  If you need a refill on your cardiac medications before your next appointment, please call your pharmacy. 

## 2016-12-22 NOTE — Progress Notes (Signed)
Cardiology Office Note    Date:  12/22/2016   ID:  [name], Melinda [name] 11-16-1934, MRN [id number]  PCP:  [name], MD  Cardiologist:   Sanda Klein, MD   No chief complaint on file.   History of Present Illness:  Melinda [name] is a 82 y.o. female with a history of paroxysmal atrial fibrillation, takotsubo cardiomyopathy with complete resolution, minor coronary artery disease with 75% stenosis of a first oblique marginal artery (asymptomatic). Last echo in August 2015 showed normal LVEF 55-60%.  She has had a couple of episodes of rapid palpitations in the last 2 months. Each occurred in the middle of the night when she was awoken very abruptly, once by a phone call, the second time by a bad dream." Both occasions she measured her heart rate at about 157 bpm and the symptoms lasted for at least 2-3 hours. The episodes of rapid palpitations were followed by increased diuresis in about 3 pound weight loss, but she did not have dizziness, syncope, shortness of breath or chest pain during the events. She continues to go to exercise in the maintenance program in cardiac rehabilitation 3 days a week.  She is compliant with anticoagulation and has not had any bleeding problems or any focal neurological events.   Past Medical History:  Diagnosis Date  . Allergy   . Anemia 1984   before hysterectomy  . Cancer ([place])    bilateral breast  . GERD (gastroesophageal reflux disease)   . History of blood product transfusion 1984  . Hyperlipidemia   . Hypertension   . Macular degeneration    right eye  . Myocardial infarction   . Osteopenia   . Takotsubo cardiomyopathy     Past Surgical History:  Procedure Laterality Date  . ABDOMINAL HYSTERECTOMY  1984  . APPENDECTOMY    . BREAST BIOPSY  04/29/05   right   . BREAST LUMPECTOMY  04/19/04   left with sentinel node  . CATARACT EXTRACTION     bilateral  . EYE SURGERY    . LEFT HEART CATHETERIZATION WITH CORONARY  ANGIOGRAM N/A 07/26/2013   Procedure: LEFT HEART CATHETERIZATION WITH CORONARY ANGIOGRAM;  Surgeon: Jettie Booze, MD;  Location: [facility] CATH LAB;  Service: Cardiovascular;  Laterality: N/A;  . RETINAL LASER PROCEDURE     right    Current Medications: Outpatient Medications Prior to Visit  Medication Sig Dispense Refill  . acetaminophen (TYLENOL) 500 MG tablet Take 1,000 mg by mouth at bedtime as needed for mild pain.    Marland Kitchen atorvastatin (LIPITOR) 20 MG tablet Take 20 mg by mouth at bedtime.     Marland Kitchen ELIQUIS 5 MG TABS tablet TAKE ONE TABLET BY MOUTH TWICE A DAY 60 tablet 0  . fexofenadine (ALLEGRA) 180 MG tablet Take 180 mg by mouth daily.     Marland Kitchen latanoprost (XALATAN) 0.005 % ophthalmic solution Place 1 drop into both eyes at bedtime.     . metoprolol succinate (TOPROL-XL) 25 MG 24 hr tablet Take 1 tablet (25 mg total) by mouth daily. 90 tablet 1  . hydrochlorothiazide (MICROZIDE) 12.5 MG capsule TAKE ONE CAPSULE BY MOUTH THREE TIMES A WEEK (ON MONDAY, WEDNESDAY, AND FRIDAY) 12 capsule 0  . nitroGLYCERIN (NITROSTAT) 0.4 MG SL tablet Place 0.4 mg under the tongue every 5 (five) minutes as needed for chest pain.    . dorzolamide-timolol (COSOPT) 22.3-6.8 MG/ML ophthalmic solution Place 1 drop into both eyes 2 (two) times daily.     Marland Kitchen  HYDROcodone-acetaminophen (NORCO) 5-325 MG per tablet Take 1 tablet by mouth daily.     . sertraline (ZOLOFT) 25 MG tablet     . valsartan (DIOVAN) 160 MG tablet Take 160 mg by mouth daily.      No facility-administered medications prior to visit.      Allergies:   Ibuprofen; Nsaids; Salicylates; Codeine; Etodolac; and Hydrochlorothiazide   Social History   Social History  . Marital status: Married    Spouse name: N/A  . Number of children: N/A  . Years of education: N/A   Social History Main Topics  . Smoking status: Former Research scientist (life sciences)  . Smokeless tobacco: Never Used  . Alcohol use 1.8 oz/week    3 Shots of liquor per week     Comment: 2-3 drinks containing  a shot each of whiskey per night  . Drug use: No  . Sexual activity: Not on file   Other Topics Concern  . Not on file   Social History Narrative  . No narrative on file     Family History:  The patient's family history includes Cancer in her father, maternal aunt, and maternal grandmother; Dementia in her mother; Heart attack in her maternal grandfather and paternal grandfather; Heart disease (age of onset: 72) in her father; Heart disease (age of onset: 57) in her mother; Stroke in her mother.   ROS:   Please see the history of present illness.    ROS All other systems reviewed and are negative.   PHYSICAL EXAM:   VS:  BP (!) 160/80   Pulse 63   Ht 5\' 3"  (1.6 m)   Wt 171 lb (77.6 kg)   BMI 30.29 kg/m     General: Alert, oriented x3, no distress, Overweight Head: no evidence of trauma, PERRL, EOMI, no exophtalmos or lid lag, no myxedema, no xanthelasma; normal ears, nose and oropharynx Neck: normal jugular venous pulsations and no hepatojugular reflux; brisk carotid pulses without delay and no carotid bruits Chest: clear to auscultation, no signs of consolidation by percussion or palpation, normal fremitus, symmetrical and full respiratory excursions Cardiovascular: normal position and quality of the apical impulse, regular rhythm and occasional isolated ectopic beats, normal first and second heart sounds, no murmurs, rubs or gallops Abdomen: no tenderness or distention, no masses by palpation, no abnormal pulsatility or arterial bruits, normal bowel sounds, no hepatosplenomegaly Extremities: no clubbing, cyanosis or edema; 2+ radial, ulnar and brachial pulses bilaterally; 2+ right femoral, posterior tibial and dorsalis pedis pulses; 2+ left femoral, posterior tibial and dorsalis pedis pulses; no subclavian or femoral bruits Neurological: grossly nonfocal Psych: Normal mood and affect   Wt Readings from Last 3 Encounters:  12/22/16 171 lb (77.6 kg)  11/26/15 188 lb (85.3 kg)    09/01/14 188 lb (85.3 kg)      Studies/Labs Reviewed:   EKG:  EKG is ordered today.  The ekg ordered today demonstrates Sinus rhythm, left axis deviation, QTC 419 ms, and no repolarization abnormalities.  Recent Labs: 09/03/2015 Glucose 95, creatinine 0.58, normal electrolytes and normal liver function tests  Lipid Panel    Component Value Date/Time   CHOL 159 07/26/2013 0718   TRIG 86 07/26/2013 0718   HDL 89 07/26/2013 0718   CHOLHDL 1.8 07/26/2013 0718   VLDL 17 07/26/2013 0718   LDLCALC 53 07/26/2013 0718    09/17/2016 Total cholesterol 166, HDL 71, LDL 79, triglycerides 80 Normal LFTs, creatinine 0.56, glucose 122, potassium 3.8  ASSESSMENT:    1. Paroxysmal atrial  fibrillation ([place])   2. Long term current use of anticoagulant   3. Coronary artery disease involving native coronary artery of native heart with other form of angina pectoris ([place])   4. Pure hypercholesterolemia   5. Essential hypertension      PLAN:  In order of problems listed above:  1. AFib: Episodes are relatively infrequent, despite the two recent episodes at night. Enhanced diuresis is more typical of paroxysmal atrial tachycardia, but she has had confirmed atrial fibrillation in the past (see ECG 12-08-2013). Advised immediate medical attention if palpitations are with associated chest pain, dyspnea or near syncope. Otherwise, she can take an extra metoprolol and "wait it out". Current burden of symptoms does not justify use of true antiarrhythmics. Compliant with anticoagulation. CHADSVasc 5 (age 76, gender, HTN, CAD). 2. Eliquis: Approved for patient assistance for another year. No bleeding complications 3. CAD: Incidentally discovered when she had coronary angiography for takotsubo syndrome. Asymptomatic despite the fact that she exercise regularly in cardiac rehabilitation. .  4. HLP: Recent lipid parameters and the desirable range 5. HTN: good control when she goes to rehabilitation,  typical  blood pressure 130/70. Mildly elevated blood pressure today, no changes made to medications.    Medication Adjustments/Labs and Tests Ordered: Current medicines are reviewed at length with the patient today.  Concerns regarding medicines are outlined above.  Medication changes, Labs and Tests ordered today are listed in the Patient Instructions below. Patient Instructions  Dr Sallyanne Kuster recommends that you schedule a follow-up appointment in 12 months. You will receive a reminder letter in the mail two months in advance. If you don't receive a letter, please call our office to schedule the follow-up appointment.  If you need a refill on your cardiac medications before your next appointment, please call your pharmacy.    Signed, Sanda Klein, MD  12/22/2016 5:14 PM    [facility] [place], [place],   [address] Phone: [phone]; Fax: [phone]

## 2016-12-24 ENCOUNTER — Encounter (HOSPITAL_COMMUNITY)
Admission: RE | Admit: 2016-12-24 | Discharge: 2016-12-24 | Disposition: A | Discharge status: Home / Self Care | Payer: Self-pay | Source: Ambulatory Visit | Attending: Cardiovascular Disease | Admitting: Cardiovascular Disease

## 2016-12-26 ENCOUNTER — Encounter (HOSPITAL_COMMUNITY)
Admission: RE | Admit: 2016-12-26 | Discharge: 2016-12-26 | Disposition: A | Discharge status: Home / Self Care | Payer: Self-pay | Source: Ambulatory Visit | Attending: Cardiovascular Disease | Admitting: Cardiovascular Disease

## 2016-12-29 ENCOUNTER — Encounter (HOSPITAL_COMMUNITY)
Admission: RE | Admit: 2016-12-29 | Discharge: 2016-12-29 | Disposition: A | Discharge status: Home / Self Care | Payer: Self-pay | Source: Ambulatory Visit | Attending: Cardiovascular Disease | Admitting: Cardiovascular Disease

## 2016-12-29 DIAGNOSIS — I219 Acute myocardial infarction, unspecified: Secondary | ICD-10-CM | POA: Insufficient documentation

## 2016-12-31 ENCOUNTER — Encounter (HOSPITAL_COMMUNITY): Payer: Self-pay

## 2017-01-02 ENCOUNTER — Encounter (HOSPITAL_COMMUNITY): Payer: Self-pay

## 2017-01-05 ENCOUNTER — Encounter (HOSPITAL_COMMUNITY)
Admission: RE | Admit: 2017-01-05 | Discharge: 2017-01-05 | Disposition: A | Discharge status: Home / Self Care | Payer: Self-pay | Source: Ambulatory Visit | Attending: Cardiovascular Disease | Admitting: Cardiovascular Disease

## 2017-01-06 DIAGNOSIS — Z23 Encounter for immunization: Secondary | ICD-10-CM | POA: Diagnosis not present

## 2017-01-07 ENCOUNTER — Encounter (HOSPITAL_COMMUNITY)
Admission: RE | Admit: 2017-01-07 | Discharge: 2017-01-07 | Disposition: A | Discharge status: Home / Self Care | Payer: Self-pay | Source: Ambulatory Visit | Attending: Cardiovascular Disease | Admitting: Cardiovascular Disease

## 2017-01-09 ENCOUNTER — Encounter (HOSPITAL_COMMUNITY): Payer: Self-pay

## 2017-01-11 ENCOUNTER — Other Ambulatory Visit: Payer: Self-pay | Admitting: Cardiovascular Disease

## 2017-01-12 ENCOUNTER — Encounter (HOSPITAL_COMMUNITY)
Admission: RE | Admit: 2017-01-12 | Discharge: 2017-01-12 | Disposition: A | Discharge status: Home / Self Care | Payer: Self-pay | Source: Ambulatory Visit | Attending: Cardiovascular Disease | Admitting: Cardiovascular Disease

## 2017-01-14 ENCOUNTER — Encounter (HOSPITAL_COMMUNITY): Payer: Self-pay

## 2017-01-16 ENCOUNTER — Encounter (HOSPITAL_COMMUNITY)
Admission: RE | Admit: 2017-01-16 | Discharge: 2017-01-16 | Disposition: A | Discharge status: Home / Self Care | Payer: Self-pay | Source: Ambulatory Visit | Attending: Cardiovascular Disease | Admitting: Cardiovascular Disease

## 2017-01-19 ENCOUNTER — Encounter (HOSPITAL_COMMUNITY): Payer: Self-pay

## 2017-01-21 ENCOUNTER — Encounter (HOSPITAL_COMMUNITY): Payer: Self-pay

## 2017-01-23 ENCOUNTER — Encounter (HOSPITAL_COMMUNITY)
Admission: RE | Admit: 2017-01-23 | Discharge: 2017-01-23 | Disposition: A | Discharge status: Home / Self Care | Payer: Self-pay | Source: Ambulatory Visit | Attending: Cardiovascular Disease | Admitting: Cardiovascular Disease

## 2017-01-26 ENCOUNTER — Encounter (HOSPITAL_COMMUNITY)
Admission: RE | Admit: 2017-01-26 | Discharge: 2017-01-26 | Disposition: A | Discharge status: Home / Self Care | Payer: Self-pay | Source: Ambulatory Visit | Attending: Cardiovascular Disease | Admitting: Cardiovascular Disease

## 2017-01-28 ENCOUNTER — Encounter (HOSPITAL_COMMUNITY)
Admission: RE | Admit: 2017-01-28 | Discharge: 2017-01-28 | Disposition: A | Discharge status: Home / Self Care | Payer: Self-pay | Source: Ambulatory Visit | Attending: Cardiovascular Disease | Admitting: Cardiovascular Disease

## 2017-01-30 ENCOUNTER — Encounter (HOSPITAL_COMMUNITY): Payer: Self-pay

## 2017-02-02 ENCOUNTER — Encounter (HOSPITAL_COMMUNITY)
Admission: RE | Admit: 2017-02-02 | Discharge: 2017-02-02 | Disposition: A | Discharge status: Home / Self Care | Payer: Self-pay | Source: Ambulatory Visit | Attending: Cardiovascular Disease | Admitting: Cardiovascular Disease

## 2017-02-02 DIAGNOSIS — I219 Acute myocardial infarction, unspecified: Secondary | ICD-10-CM | POA: Insufficient documentation

## 2017-02-04 ENCOUNTER — Encounter (HOSPITAL_COMMUNITY): Payer: Self-pay

## 2017-02-06 ENCOUNTER — Encounter (HOSPITAL_COMMUNITY): Payer: Self-pay

## 2017-02-09 ENCOUNTER — Encounter (HOSPITAL_COMMUNITY): Payer: Self-pay

## 2017-02-11 ENCOUNTER — Encounter (HOSPITAL_COMMUNITY): Payer: Self-pay

## 2017-02-13 ENCOUNTER — Encounter (HOSPITAL_COMMUNITY): Payer: Self-pay

## 2017-02-16 ENCOUNTER — Encounter (HOSPITAL_COMMUNITY): Payer: Self-pay

## 2017-02-17 DIAGNOSIS — H353212 Exudative age-related macular degeneration, right eye, with inactive choroidal neovascularization: Secondary | ICD-10-CM | POA: Diagnosis not present

## 2017-02-17 DIAGNOSIS — H353122 Nonexudative age-related macular degeneration, left eye, intermediate dry stage: Secondary | ICD-10-CM | POA: Diagnosis not present

## 2017-02-17 DIAGNOSIS — H43393 Other vitreous opacities, bilateral: Secondary | ICD-10-CM | POA: Diagnosis not present

## 2017-02-18 ENCOUNTER — Encounter (HOSPITAL_COMMUNITY): Payer: Self-pay

## 2017-02-23 ENCOUNTER — Encounter (HOSPITAL_COMMUNITY)
Admission: RE | Admit: 2017-02-23 | Discharge: 2017-02-23 | Disposition: A | Discharge status: Home / Self Care | Payer: Self-pay | Source: Ambulatory Visit | Attending: Cardiovascular Disease | Admitting: Cardiovascular Disease

## 2017-02-25 ENCOUNTER — Encounter (HOSPITAL_COMMUNITY)
Admission: RE | Admit: 2017-02-25 | Discharge: 2017-02-25 | Disposition: A | Discharge status: Home / Self Care | Payer: Self-pay | Source: Ambulatory Visit | Attending: Cardiovascular Disease | Admitting: Cardiovascular Disease

## 2017-02-27 ENCOUNTER — Encounter (HOSPITAL_COMMUNITY)
Admission: RE | Admit: 2017-02-27 | Discharge: 2017-02-27 | Disposition: A | Discharge status: Home / Self Care | Payer: Self-pay | Source: Ambulatory Visit | Attending: Cardiovascular Disease | Admitting: Cardiovascular Disease

## 2017-03-02 ENCOUNTER — Encounter (HOSPITAL_COMMUNITY)
Admission: RE | Admit: 2017-03-02 | Discharge: 2017-03-02 | Disposition: A | Discharge status: Home / Self Care | Payer: Self-pay | Source: Ambulatory Visit | Attending: Cardiovascular Disease | Admitting: Cardiovascular Disease

## 2017-03-02 DIAGNOSIS — I219 Acute myocardial infarction, unspecified: Secondary | ICD-10-CM | POA: Insufficient documentation

## 2017-03-04 ENCOUNTER — Encounter (HOSPITAL_COMMUNITY): Payer: Self-pay

## 2017-03-06 ENCOUNTER — Encounter (HOSPITAL_COMMUNITY): Payer: Self-pay

## 2017-03-09 ENCOUNTER — Encounter (HOSPITAL_COMMUNITY): Payer: Self-pay

## 2017-03-11 ENCOUNTER — Encounter (HOSPITAL_COMMUNITY): Payer: Self-pay

## 2017-03-13 ENCOUNTER — Encounter (HOSPITAL_COMMUNITY)
Admission: RE | Admit: 2017-03-13 | Discharge: 2017-03-13 | Disposition: A | Discharge status: Home / Self Care | Payer: Medicare Other | Source: Ambulatory Visit | Attending: Cardiovascular Disease | Admitting: Cardiovascular Disease

## 2017-03-16 ENCOUNTER — Encounter (HOSPITAL_COMMUNITY)
Admission: RE | Admit: 2017-03-16 | Discharge: 2017-03-16 | Disposition: A | Discharge status: Home / Self Care | Payer: Self-pay | Source: Ambulatory Visit | Attending: Cardiovascular Disease | Admitting: Cardiovascular Disease

## 2017-03-18 ENCOUNTER — Encounter (HOSPITAL_COMMUNITY): Payer: Self-pay

## 2017-03-20 ENCOUNTER — Encounter (HOSPITAL_COMMUNITY)
Admission: RE | Admit: 2017-03-20 | Discharge: 2017-03-20 | Disposition: A | Discharge status: Home / Self Care | Payer: Self-pay | Source: Ambulatory Visit | Attending: Cardiovascular Disease | Admitting: Cardiovascular Disease

## 2017-03-25 ENCOUNTER — Encounter (HOSPITAL_COMMUNITY): Payer: Self-pay

## 2017-03-27 ENCOUNTER — Encounter (HOSPITAL_COMMUNITY): Payer: Self-pay

## 2017-03-30 ENCOUNTER — Encounter (HOSPITAL_COMMUNITY): Payer: Self-pay

## 2017-03-30 ENCOUNTER — Other Ambulatory Visit: Payer: Self-pay | Admitting: Family Medicine

## 2017-03-30 DIAGNOSIS — Z1231 Encounter for screening mammogram for malignant neoplasm of breast: Secondary | ICD-10-CM

## 2017-04-01 ENCOUNTER — Encounter (HOSPITAL_COMMUNITY): Payer: Self-pay

## 2017-04-01 DIAGNOSIS — I219 Acute myocardial infarction, unspecified: Secondary | ICD-10-CM | POA: Insufficient documentation

## 2017-04-03 ENCOUNTER — Encounter (HOSPITAL_COMMUNITY)
Admission: RE | Admit: 2017-04-03 | Discharge: 2017-04-03 | Disposition: A | Discharge status: Home / Self Care | Payer: Self-pay | Source: Ambulatory Visit | Attending: Cardiovascular Disease | Admitting: Cardiovascular Disease

## 2017-04-06 ENCOUNTER — Encounter (HOSPITAL_COMMUNITY): Payer: Self-pay

## 2017-04-06 DIAGNOSIS — H16223 Keratoconjunctivitis sicca, not specified as Sjogren's, bilateral: Secondary | ICD-10-CM | POA: Diagnosis not present

## 2017-04-06 DIAGNOSIS — H401131 Primary open-angle glaucoma, bilateral, mild stage: Secondary | ICD-10-CM | POA: Diagnosis not present

## 2017-04-06 DIAGNOSIS — H40051 Ocular hypertension, right eye: Secondary | ICD-10-CM | POA: Diagnosis not present

## 2017-04-06 DIAGNOSIS — H04123 Dry eye syndrome of bilateral lacrimal glands: Secondary | ICD-10-CM | POA: Diagnosis not present

## 2017-04-07 DIAGNOSIS — I119 Hypertensive heart disease without heart failure: Secondary | ICD-10-CM | POA: Diagnosis not present

## 2017-04-07 DIAGNOSIS — I429 Cardiomyopathy, unspecified: Secondary | ICD-10-CM | POA: Diagnosis not present

## 2017-04-07 DIAGNOSIS — E7800 Pure hypercholesterolemia, unspecified: Secondary | ICD-10-CM | POA: Diagnosis not present

## 2017-04-07 DIAGNOSIS — I2510 Atherosclerotic heart disease of native coronary artery without angina pectoris: Secondary | ICD-10-CM | POA: Diagnosis not present

## 2017-04-07 DIAGNOSIS — E669 Obesity, unspecified: Secondary | ICD-10-CM | POA: Diagnosis not present

## 2017-04-07 DIAGNOSIS — I4891 Unspecified atrial fibrillation: Secondary | ICD-10-CM | POA: Diagnosis not present

## 2017-04-08 ENCOUNTER — Encounter (HOSPITAL_COMMUNITY)
Admission: RE | Admit: 2017-04-08 | Discharge: 2017-04-08 | Disposition: A | Discharge status: Home / Self Care | Payer: Self-pay | Source: Ambulatory Visit | Attending: Cardiovascular Disease | Admitting: Cardiovascular Disease

## 2017-04-10 ENCOUNTER — Encounter (HOSPITAL_COMMUNITY)
Admission: RE | Admit: 2017-04-10 | Discharge: 2017-04-10 | Disposition: A | Discharge status: Home / Self Care | Payer: Medicare Other | Source: Ambulatory Visit | Attending: Cardiovascular Disease | Admitting: Cardiovascular Disease

## 2017-04-13 ENCOUNTER — Encounter (HOSPITAL_COMMUNITY)
Admission: RE | Admit: 2017-04-13 | Discharge: 2017-04-13 | Disposition: A | Discharge status: Home / Self Care | Payer: Self-pay | Source: Ambulatory Visit | Attending: Cardiovascular Disease | Admitting: Cardiovascular Disease

## 2017-04-15 ENCOUNTER — Encounter (HOSPITAL_COMMUNITY): Payer: Self-pay

## 2017-04-17 ENCOUNTER — Encounter (HOSPITAL_COMMUNITY)
Admission: RE | Admit: 2017-04-17 | Discharge: 2017-04-17 | Disposition: A | Discharge status: Home / Self Care | Payer: Self-pay | Source: Ambulatory Visit | Attending: Cardiovascular Disease | Admitting: Cardiovascular Disease

## 2017-04-20 ENCOUNTER — Encounter (HOSPITAL_COMMUNITY): Payer: Self-pay

## 2017-04-22 ENCOUNTER — Encounter (HOSPITAL_COMMUNITY)
Admission: RE | Admit: 2017-04-22 | Discharge: 2017-04-22 | Disposition: A | Discharge status: Home / Self Care | Payer: Self-pay | Source: Ambulatory Visit | Attending: Cardiovascular Disease | Admitting: Cardiovascular Disease

## 2017-04-24 ENCOUNTER — Encounter (HOSPITAL_COMMUNITY)
Admission: RE | Admit: 2017-04-24 | Discharge: 2017-04-24 | Disposition: A | Discharge status: Home / Self Care | Payer: Self-pay | Source: Ambulatory Visit | Attending: Cardiovascular Disease | Admitting: Cardiovascular Disease

## 2017-04-27 ENCOUNTER — Encounter (HOSPITAL_COMMUNITY)
Admission: RE | Admit: 2017-04-27 | Discharge: 2017-04-27 | Disposition: A | Discharge status: Home / Self Care | Payer: Self-pay | Source: Ambulatory Visit | Attending: Cardiovascular Disease | Admitting: Cardiovascular Disease

## 2017-04-28 DIAGNOSIS — H43812 Vitreous degeneration, left eye: Secondary | ICD-10-CM | POA: Diagnosis not present

## 2017-04-28 DIAGNOSIS — Z9889 Other specified postprocedural states: Secondary | ICD-10-CM | POA: Diagnosis not present

## 2017-04-28 DIAGNOSIS — H353212 Exudative age-related macular degeneration, right eye, with inactive choroidal neovascularization: Secondary | ICD-10-CM | POA: Diagnosis not present

## 2017-04-28 DIAGNOSIS — H35341 Macular cyst, hole, or pseudohole, right eye: Secondary | ICD-10-CM | POA: Diagnosis not present

## 2017-04-28 DIAGNOSIS — H353132 Nonexudative age-related macular degeneration, bilateral, intermediate dry stage: Secondary | ICD-10-CM | POA: Diagnosis not present

## 2017-04-29 ENCOUNTER — Encounter (HOSPITAL_COMMUNITY): Payer: Self-pay

## 2017-05-04 ENCOUNTER — Ambulatory Visit
Admission: RE | Admit: 2017-05-04 | Discharge: 2017-05-04 | Disposition: A | Discharge status: Home / Self Care | Payer: Medicare Other | Source: Ambulatory Visit | Attending: Family Medicine | Admitting: Family Medicine

## 2017-05-04 DIAGNOSIS — Z1231 Encounter for screening mammogram for malignant neoplasm of breast: Secondary | ICD-10-CM

## 2017-05-04 DIAGNOSIS — H40051 Ocular hypertension, right eye: Secondary | ICD-10-CM | POA: Diagnosis not present

## 2017-05-04 DIAGNOSIS — H401111 Primary open-angle glaucoma, right eye, mild stage: Secondary | ICD-10-CM | POA: Diagnosis not present

## 2017-05-04 HISTORY — DX: Personal history of irradiation: Z92.3

## 2017-05-08 ENCOUNTER — Encounter (HOSPITAL_COMMUNITY)
Admission: RE | Admit: 2017-05-08 | Discharge: 2017-05-08 | Disposition: A | Discharge status: Home / Self Care | Payer: Self-pay | Source: Ambulatory Visit | Attending: Cardiovascular Disease | Admitting: Cardiovascular Disease

## 2017-05-08 DIAGNOSIS — I219 Acute myocardial infarction, unspecified: Secondary | ICD-10-CM | POA: Insufficient documentation

## 2017-05-11 ENCOUNTER — Encounter (HOSPITAL_COMMUNITY)
Admission: RE | Admit: 2017-05-11 | Discharge: 2017-05-11 | Disposition: A | Discharge status: Home / Self Care | Payer: Self-pay | Source: Ambulatory Visit | Attending: Cardiovascular Disease | Admitting: Cardiovascular Disease

## 2017-05-13 ENCOUNTER — Encounter (HOSPITAL_COMMUNITY)
Admission: RE | Admit: 2017-05-13 | Discharge: 2017-05-13 | Disposition: A | Discharge status: Home / Self Care | Payer: Self-pay | Source: Ambulatory Visit | Attending: Cardiovascular Disease | Admitting: Cardiovascular Disease

## 2017-05-18 ENCOUNTER — Encounter (HOSPITAL_COMMUNITY)
Admission: RE | Admit: 2017-05-18 | Discharge: 2017-05-18 | Disposition: A | Discharge status: Home / Self Care | Payer: Self-pay | Source: Ambulatory Visit | Attending: Cardiovascular Disease | Admitting: Cardiovascular Disease

## 2017-05-22 ENCOUNTER — Encounter (HOSPITAL_COMMUNITY)
Admission: RE | Admit: 2017-05-22 | Discharge: 2017-05-22 | Disposition: A | Discharge status: Home / Self Care | Payer: Self-pay | Source: Ambulatory Visit | Attending: Cardiovascular Disease | Admitting: Cardiovascular Disease

## 2017-05-25 ENCOUNTER — Encounter (HOSPITAL_COMMUNITY)
Admission: RE | Admit: 2017-05-25 | Discharge: 2017-05-25 | Disposition: A | Discharge status: Home / Self Care | Payer: Self-pay | Source: Ambulatory Visit | Attending: Cardiovascular Disease | Admitting: Cardiovascular Disease

## 2017-05-27 DIAGNOSIS — H401121 Primary open-angle glaucoma, left eye, mild stage: Secondary | ICD-10-CM | POA: Diagnosis not present

## 2017-05-29 ENCOUNTER — Encounter (HOSPITAL_COMMUNITY): Payer: Self-pay

## 2017-05-29 DIAGNOSIS — I480 Paroxysmal atrial fibrillation: Secondary | ICD-10-CM | POA: Insufficient documentation

## 2017-06-01 ENCOUNTER — Encounter (HOSPITAL_COMMUNITY)
Admission: RE | Admit: 2017-06-01 | Discharge: 2017-06-01 | Disposition: A | Discharge status: Home / Self Care | Payer: Self-pay | Source: Ambulatory Visit | Attending: Cardiovascular Disease | Admitting: Cardiovascular Disease

## 2017-06-03 ENCOUNTER — Encounter (HOSPITAL_COMMUNITY): Payer: Self-pay

## 2017-06-05 ENCOUNTER — Encounter (HOSPITAL_COMMUNITY)
Admission: RE | Admit: 2017-06-05 | Discharge: 2017-06-05 | Disposition: A | Discharge status: Home / Self Care | Payer: Self-pay | Source: Ambulatory Visit | Attending: Cardiovascular Disease | Admitting: Cardiovascular Disease

## 2017-06-08 ENCOUNTER — Encounter (HOSPITAL_COMMUNITY)
Admission: RE | Admit: 2017-06-08 | Discharge: 2017-06-08 | Disposition: A | Discharge status: Home / Self Care | Payer: Self-pay | Source: Ambulatory Visit | Attending: Cardiovascular Disease | Admitting: Cardiovascular Disease

## 2017-06-10 ENCOUNTER — Encounter (HOSPITAL_COMMUNITY)
Admission: RE | Admit: 2017-06-10 | Discharge: 2017-06-10 | Disposition: A | Discharge status: Home / Self Care | Payer: Self-pay | Source: Ambulatory Visit | Attending: Cardiovascular Disease | Admitting: Cardiovascular Disease

## 2017-06-12 ENCOUNTER — Encounter (HOSPITAL_COMMUNITY)
Admission: RE | Admit: 2017-06-12 | Discharge: 2017-06-12 | Disposition: A | Discharge status: Home / Self Care | Payer: Self-pay | Source: Ambulatory Visit | Attending: Cardiovascular Disease | Admitting: Cardiovascular Disease

## 2017-06-15 ENCOUNTER — Encounter (HOSPITAL_COMMUNITY): Payer: Self-pay

## 2017-06-17 ENCOUNTER — Encounter (HOSPITAL_COMMUNITY): Payer: Self-pay

## 2017-06-19 ENCOUNTER — Encounter (HOSPITAL_COMMUNITY): Payer: Self-pay

## 2017-06-22 ENCOUNTER — Encounter (HOSPITAL_COMMUNITY)
Admission: RE | Admit: 2017-06-22 | Discharge: 2017-06-22 | Disposition: A | Discharge status: Home / Self Care | Payer: Self-pay | Source: Ambulatory Visit | Attending: Cardiovascular Disease | Admitting: Cardiovascular Disease

## 2017-06-24 ENCOUNTER — Encounter (HOSPITAL_COMMUNITY)
Admission: RE | Admit: 2017-06-24 | Discharge: 2017-06-24 | Disposition: A | Discharge status: Home / Self Care | Payer: Self-pay | Source: Ambulatory Visit | Attending: Cardiovascular Disease | Admitting: Cardiovascular Disease

## 2017-06-26 ENCOUNTER — Encounter (HOSPITAL_COMMUNITY): Payer: Self-pay

## 2017-06-29 ENCOUNTER — Encounter (HOSPITAL_COMMUNITY)
Admission: RE | Admit: 2017-06-29 | Discharge: 2017-06-29 | Disposition: A | Discharge status: Home / Self Care | Payer: Self-pay | Source: Ambulatory Visit | Attending: Cardiovascular Disease | Admitting: Cardiovascular Disease

## 2017-06-29 DIAGNOSIS — I480 Paroxysmal atrial fibrillation: Secondary | ICD-10-CM | POA: Insufficient documentation

## 2017-06-29 DIAGNOSIS — H401131 Primary open-angle glaucoma, bilateral, mild stage: Secondary | ICD-10-CM | POA: Diagnosis not present

## 2017-06-29 DIAGNOSIS — H16223 Keratoconjunctivitis sicca, not specified as Sjogren's, bilateral: Secondary | ICD-10-CM | POA: Diagnosis not present

## 2017-06-29 DIAGNOSIS — H40053 Ocular hypertension, bilateral: Secondary | ICD-10-CM | POA: Diagnosis not present

## 2017-06-29 DIAGNOSIS — H04123 Dry eye syndrome of bilateral lacrimal glands: Secondary | ICD-10-CM | POA: Diagnosis not present

## 2017-07-01 ENCOUNTER — Encounter (HOSPITAL_COMMUNITY): Payer: Self-pay

## 2017-07-03 ENCOUNTER — Encounter (HOSPITAL_COMMUNITY): Payer: Self-pay

## 2017-07-06 ENCOUNTER — Encounter (HOSPITAL_COMMUNITY)
Admission: RE | Admit: 2017-07-06 | Discharge: 2017-07-06 | Disposition: A | Discharge status: Home / Self Care | Payer: Self-pay | Source: Ambulatory Visit | Attending: Cardiovascular Disease | Admitting: Cardiovascular Disease

## 2017-07-08 ENCOUNTER — Encounter (HOSPITAL_COMMUNITY)
Admission: RE | Admit: 2017-07-08 | Discharge: 2017-07-08 | Disposition: A | Discharge status: Home / Self Care | Payer: Self-pay | Source: Ambulatory Visit | Attending: Cardiovascular Disease | Admitting: Cardiovascular Disease

## 2017-07-10 ENCOUNTER — Encounter (HOSPITAL_COMMUNITY)
Admission: RE | Admit: 2017-07-10 | Discharge: 2017-07-10 | Disposition: A | Discharge status: Home / Self Care | Payer: Self-pay | Source: Ambulatory Visit | Attending: Cardiovascular Disease | Admitting: Cardiovascular Disease

## 2017-07-13 ENCOUNTER — Encounter (HOSPITAL_COMMUNITY)
Admission: RE | Admit: 2017-07-13 | Discharge: 2017-07-13 | Disposition: A | Discharge status: Home / Self Care | Payer: Self-pay | Source: Ambulatory Visit | Attending: Cardiovascular Disease | Admitting: Cardiovascular Disease

## 2017-07-13 ENCOUNTER — Other Ambulatory Visit: Payer: Self-pay | Admitting: Cardiovascular Disease

## 2017-07-15 ENCOUNTER — Encounter (HOSPITAL_COMMUNITY): Payer: Self-pay

## 2017-07-17 ENCOUNTER — Encounter (HOSPITAL_COMMUNITY)
Admission: RE | Admit: 2017-07-17 | Discharge: 2017-07-17 | Disposition: A | Discharge status: Home / Self Care | Payer: Self-pay | Source: Ambulatory Visit | Attending: Cardiovascular Disease | Admitting: Cardiovascular Disease

## 2017-07-20 ENCOUNTER — Encounter (HOSPITAL_COMMUNITY): Payer: Self-pay

## 2017-07-22 ENCOUNTER — Encounter (HOSPITAL_COMMUNITY): Payer: Self-pay

## 2017-07-24 ENCOUNTER — Encounter (HOSPITAL_COMMUNITY): Payer: Self-pay

## 2017-07-27 ENCOUNTER — Encounter (HOSPITAL_COMMUNITY): Payer: Self-pay

## 2017-07-29 ENCOUNTER — Encounter (HOSPITAL_COMMUNITY): Payer: Self-pay

## 2017-07-29 DIAGNOSIS — I480 Paroxysmal atrial fibrillation: Secondary | ICD-10-CM | POA: Insufficient documentation

## 2017-07-31 ENCOUNTER — Encounter (HOSPITAL_COMMUNITY): Payer: Self-pay

## 2017-08-03 ENCOUNTER — Encounter (HOSPITAL_COMMUNITY): Payer: Self-pay

## 2017-08-05 ENCOUNTER — Encounter (HOSPITAL_COMMUNITY)
Admission: RE | Admit: 2017-08-05 | Discharge: 2017-08-05 | Disposition: A | Discharge status: Home / Self Care | Payer: Self-pay | Source: Ambulatory Visit | Attending: Cardiovascular Disease | Admitting: Cardiovascular Disease

## 2017-08-07 ENCOUNTER — Encounter (HOSPITAL_COMMUNITY): Payer: Self-pay

## 2017-08-10 ENCOUNTER — Encounter (HOSPITAL_COMMUNITY)
Admission: RE | Admit: 2017-08-10 | Discharge: 2017-08-10 | Disposition: A | Discharge status: Home / Self Care | Payer: Self-pay | Source: Ambulatory Visit | Attending: Cardiovascular Disease | Admitting: Cardiovascular Disease

## 2017-08-12 ENCOUNTER — Encounter (HOSPITAL_COMMUNITY): Payer: Self-pay

## 2017-08-14 ENCOUNTER — Encounter (HOSPITAL_COMMUNITY): Payer: Self-pay

## 2017-08-17 ENCOUNTER — Encounter (HOSPITAL_COMMUNITY)
Admission: RE | Admit: 2017-08-17 | Discharge: 2017-08-17 | Disposition: A | Discharge status: Home / Self Care | Payer: Self-pay | Source: Ambulatory Visit | Attending: Cardiovascular Disease | Admitting: Cardiovascular Disease

## 2017-08-19 ENCOUNTER — Encounter (HOSPITAL_COMMUNITY)
Admission: RE | Admit: 2017-08-19 | Discharge: 2017-08-19 | Disposition: A | Discharge status: Home / Self Care | Payer: Self-pay | Source: Ambulatory Visit | Attending: Cardiovascular Disease | Admitting: Cardiovascular Disease

## 2017-08-21 ENCOUNTER — Encounter (HOSPITAL_COMMUNITY): Payer: Self-pay

## 2017-08-26 ENCOUNTER — Encounter (HOSPITAL_COMMUNITY): Payer: Self-pay

## 2017-08-28 ENCOUNTER — Encounter (HOSPITAL_COMMUNITY)
Admission: RE | Admit: 2017-08-28 | Discharge: 2017-08-28 | Disposition: A | Discharge status: Home / Self Care | Payer: Medicare Other | Source: Ambulatory Visit | Attending: Cardiovascular Disease | Admitting: Cardiovascular Disease

## 2017-08-31 ENCOUNTER — Encounter (HOSPITAL_COMMUNITY)
Admission: RE | Admit: 2017-08-31 | Discharge: 2017-08-31 | Disposition: A | Discharge status: Home / Self Care | Payer: Self-pay | Source: Ambulatory Visit | Attending: Cardiovascular Disease | Admitting: Cardiovascular Disease

## 2017-08-31 DIAGNOSIS — I480 Paroxysmal atrial fibrillation: Secondary | ICD-10-CM | POA: Insufficient documentation

## 2017-09-02 ENCOUNTER — Encounter (HOSPITAL_COMMUNITY): Payer: Self-pay

## 2017-09-03 DIAGNOSIS — H16223 Keratoconjunctivitis sicca, not specified as Sjogren's, bilateral: Secondary | ICD-10-CM | POA: Diagnosis not present

## 2017-09-03 DIAGNOSIS — H04123 Dry eye syndrome of bilateral lacrimal glands: Secondary | ICD-10-CM | POA: Diagnosis not present

## 2017-09-03 DIAGNOSIS — H40053 Ocular hypertension, bilateral: Secondary | ICD-10-CM | POA: Diagnosis not present

## 2017-09-03 DIAGNOSIS — H401131 Primary open-angle glaucoma, bilateral, mild stage: Secondary | ICD-10-CM | POA: Diagnosis not present

## 2017-09-04 ENCOUNTER — Encounter (HOSPITAL_COMMUNITY): Payer: Self-pay

## 2017-09-07 ENCOUNTER — Encounter (HOSPITAL_COMMUNITY): Payer: Self-pay

## 2017-09-09 ENCOUNTER — Encounter (HOSPITAL_COMMUNITY)
Admission: RE | Admit: 2017-09-09 | Discharge: 2017-09-09 | Disposition: A | Discharge status: Home / Self Care | Payer: Self-pay | Source: Ambulatory Visit | Attending: Cardiovascular Disease | Admitting: Cardiovascular Disease

## 2017-09-11 ENCOUNTER — Encounter (HOSPITAL_COMMUNITY)
Admission: RE | Admit: 2017-09-11 | Discharge: 2017-09-11 | Disposition: A | Discharge status: Home / Self Care | Payer: Self-pay | Source: Ambulatory Visit | Attending: Cardiovascular Disease | Admitting: Cardiovascular Disease

## 2017-09-14 ENCOUNTER — Encounter (HOSPITAL_COMMUNITY)
Admission: RE | Admit: 2017-09-14 | Discharge: 2017-09-14 | Disposition: A | Discharge status: Home / Self Care | Payer: Self-pay | Source: Ambulatory Visit | Attending: Cardiovascular Disease | Admitting: Cardiovascular Disease

## 2017-09-16 ENCOUNTER — Encounter (HOSPITAL_COMMUNITY)
Admission: RE | Admit: 2017-09-16 | Discharge: 2017-09-16 | Disposition: A | Discharge status: Home / Self Care | Payer: Medicare Other | Source: Ambulatory Visit | Attending: Cardiovascular Disease | Admitting: Cardiovascular Disease

## 2017-09-18 ENCOUNTER — Encounter (HOSPITAL_COMMUNITY)
Admission: RE | Admit: 2017-09-18 | Discharge: 2017-09-18 | Disposition: A | Discharge status: Home / Self Care | Payer: Self-pay | Source: Ambulatory Visit | Attending: Cardiovascular Disease | Admitting: Cardiovascular Disease

## 2017-09-21 ENCOUNTER — Encounter (HOSPITAL_COMMUNITY): Payer: Self-pay

## 2017-09-23 ENCOUNTER — Encounter (HOSPITAL_COMMUNITY): Payer: Self-pay

## 2017-09-25 ENCOUNTER — Encounter (HOSPITAL_COMMUNITY)
Admission: RE | Admit: 2017-09-25 | Discharge: 2017-09-25 | Disposition: A | Discharge status: Home / Self Care | Payer: Medicare Other | Source: Ambulatory Visit | Attending: Cardiovascular Disease | Admitting: Cardiovascular Disease

## 2017-09-28 ENCOUNTER — Encounter (HOSPITAL_COMMUNITY)
Admission: RE | Admit: 2017-09-28 | Discharge: 2017-09-28 | Disposition: A | Discharge status: Home / Self Care | Payer: Self-pay | Source: Ambulatory Visit | Attending: Cardiovascular Disease | Admitting: Cardiovascular Disease

## 2017-09-28 DIAGNOSIS — I480 Paroxysmal atrial fibrillation: Secondary | ICD-10-CM | POA: Insufficient documentation

## 2017-09-30 ENCOUNTER — Encounter (HOSPITAL_COMMUNITY): Payer: Self-pay

## 2017-10-02 ENCOUNTER — Encounter (HOSPITAL_COMMUNITY): Payer: Self-pay

## 2017-10-05 ENCOUNTER — Encounter (HOSPITAL_COMMUNITY): Payer: Self-pay

## 2017-10-05 DIAGNOSIS — I119 Hypertensive heart disease without heart failure: Secondary | ICD-10-CM | POA: Diagnosis not present

## 2017-10-05 DIAGNOSIS — R7309 Other abnormal glucose: Secondary | ICD-10-CM | POA: Diagnosis not present

## 2017-10-05 DIAGNOSIS — I429 Cardiomyopathy, unspecified: Secondary | ICD-10-CM | POA: Diagnosis not present

## 2017-10-05 DIAGNOSIS — Z0000 Encounter for general adult medical examination without abnormal findings: Secondary | ICD-10-CM | POA: Diagnosis not present

## 2017-10-05 DIAGNOSIS — J301 Allergic rhinitis due to pollen: Secondary | ICD-10-CM | POA: Diagnosis not present

## 2017-10-05 DIAGNOSIS — Z1389 Encounter for screening for other disorder: Secondary | ICD-10-CM | POA: Diagnosis not present

## 2017-10-05 DIAGNOSIS — F39 Unspecified mood [affective] disorder: Secondary | ICD-10-CM | POA: Diagnosis not present

## 2017-10-05 DIAGNOSIS — Z853 Personal history of malignant neoplasm of breast: Secondary | ICD-10-CM | POA: Diagnosis not present

## 2017-10-05 DIAGNOSIS — I4891 Unspecified atrial fibrillation: Secondary | ICD-10-CM | POA: Diagnosis not present

## 2017-10-05 DIAGNOSIS — I2510 Atherosclerotic heart disease of native coronary artery without angina pectoris: Secondary | ICD-10-CM | POA: Diagnosis not present

## 2017-10-05 DIAGNOSIS — E7800 Pure hypercholesterolemia, unspecified: Secondary | ICD-10-CM | POA: Diagnosis not present

## 2017-10-05 DIAGNOSIS — E669 Obesity, unspecified: Secondary | ICD-10-CM | POA: Diagnosis not present

## 2017-10-07 ENCOUNTER — Encounter (HOSPITAL_COMMUNITY)
Admission: RE | Admit: 2017-10-07 | Discharge: 2017-10-07 | Disposition: A | Discharge status: Home / Self Care | Payer: Self-pay | Source: Ambulatory Visit | Attending: Cardiovascular Disease | Admitting: Cardiovascular Disease

## 2017-10-09 ENCOUNTER — Encounter (HOSPITAL_COMMUNITY)
Admission: RE | Admit: 2017-10-09 | Discharge: 2017-10-09 | Disposition: A | Discharge status: Home / Self Care | Payer: Self-pay | Source: Ambulatory Visit | Attending: Cardiovascular Disease | Admitting: Cardiovascular Disease

## 2017-10-12 ENCOUNTER — Encounter (HOSPITAL_COMMUNITY)
Admission: RE | Admit: 2017-10-12 | Discharge: 2017-10-12 | Disposition: A | Discharge status: Home / Self Care | Payer: Medicare Other | Source: Ambulatory Visit | Attending: Cardiovascular Disease | Admitting: Cardiovascular Disease

## 2017-10-14 ENCOUNTER — Encounter (HOSPITAL_COMMUNITY): Payer: Self-pay

## 2017-10-16 ENCOUNTER — Encounter (HOSPITAL_COMMUNITY): Payer: Self-pay

## 2017-10-19 ENCOUNTER — Encounter (HOSPITAL_COMMUNITY)
Admission: RE | Admit: 2017-10-19 | Discharge: 2017-10-19 | Disposition: A | Discharge status: Home / Self Care | Payer: Medicare Other | Source: Ambulatory Visit | Attending: Cardiovascular Disease | Admitting: Cardiovascular Disease

## 2017-10-21 ENCOUNTER — Encounter (HOSPITAL_COMMUNITY): Payer: Self-pay

## 2017-10-21 DIAGNOSIS — H401131 Primary open-angle glaucoma, bilateral, mild stage: Secondary | ICD-10-CM | POA: Diagnosis not present

## 2017-10-21 DIAGNOSIS — H353212 Exudative age-related macular degeneration, right eye, with inactive choroidal neovascularization: Secondary | ICD-10-CM | POA: Diagnosis not present

## 2017-10-21 DIAGNOSIS — H35033 Hypertensive retinopathy, bilateral: Secondary | ICD-10-CM | POA: Diagnosis not present

## 2017-10-21 DIAGNOSIS — H35373 Puckering of macula, bilateral: Secondary | ICD-10-CM | POA: Diagnosis not present

## 2017-10-23 ENCOUNTER — Encounter (HOSPITAL_COMMUNITY)
Admission: RE | Admit: 2017-10-23 | Discharge: 2017-10-23 | Disposition: A | Discharge status: Home / Self Care | Payer: Self-pay | Source: Ambulatory Visit | Attending: Cardiovascular Disease | Admitting: Cardiovascular Disease

## 2017-10-26 ENCOUNTER — Encounter (HOSPITAL_COMMUNITY)
Admission: RE | Admit: 2017-10-26 | Discharge: 2017-10-26 | Disposition: A | Discharge status: Home / Self Care | Payer: Self-pay | Source: Ambulatory Visit | Attending: Cardiovascular Disease | Admitting: Cardiovascular Disease

## 2017-10-28 ENCOUNTER — Encounter (HOSPITAL_COMMUNITY): Payer: Self-pay

## 2017-10-30 ENCOUNTER — Encounter (HOSPITAL_COMMUNITY): Payer: Self-pay

## 2017-10-30 DIAGNOSIS — I480 Paroxysmal atrial fibrillation: Secondary | ICD-10-CM | POA: Insufficient documentation

## 2017-11-02 ENCOUNTER — Encounter (HOSPITAL_COMMUNITY)
Admission: RE | Admit: 2017-11-02 | Discharge: 2017-11-02 | Disposition: A | Discharge status: Home / Self Care | Payer: Medicare Other | Source: Ambulatory Visit | Attending: Cardiovascular Disease | Admitting: Cardiovascular Disease

## 2017-11-04 ENCOUNTER — Encounter (HOSPITAL_COMMUNITY)
Admission: RE | Admit: 2017-11-04 | Discharge: 2017-11-04 | Disposition: A | Discharge status: Home / Self Care | Payer: Self-pay | Source: Ambulatory Visit | Attending: Cardiovascular Disease | Admitting: Cardiovascular Disease

## 2017-11-06 ENCOUNTER — Encounter (HOSPITAL_COMMUNITY)
Admission: RE | Admit: 2017-11-06 | Discharge: 2017-11-06 | Disposition: A | Discharge status: Home / Self Care | Payer: Self-pay | Source: Ambulatory Visit | Attending: Cardiovascular Disease | Admitting: Cardiovascular Disease

## 2017-11-09 ENCOUNTER — Encounter (HOSPITAL_COMMUNITY): Payer: Self-pay

## 2017-11-10 DIAGNOSIS — M8588 Other specified disorders of bone density and structure, other site: Secondary | ICD-10-CM | POA: Diagnosis not present

## 2017-11-11 ENCOUNTER — Encounter (HOSPITAL_COMMUNITY)
Admission: RE | Admit: 2017-11-11 | Discharge: 2017-11-11 | Disposition: A | Discharge status: Home / Self Care | Payer: Self-pay | Source: Ambulatory Visit | Attending: Cardiovascular Disease | Admitting: Cardiovascular Disease

## 2017-11-13 ENCOUNTER — Encounter (HOSPITAL_COMMUNITY)
Admission: RE | Admit: 2017-11-13 | Discharge: 2017-11-13 | Disposition: A | Discharge status: Home / Self Care | Payer: Self-pay | Source: Ambulatory Visit | Attending: Cardiovascular Disease | Admitting: Cardiovascular Disease

## 2017-11-16 ENCOUNTER — Encounter (HOSPITAL_COMMUNITY)
Admission: RE | Admit: 2017-11-16 | Discharge: 2017-11-16 | Disposition: A | Discharge status: Home / Self Care | Payer: Self-pay | Source: Ambulatory Visit | Attending: Cardiovascular Disease | Admitting: Cardiovascular Disease

## 2017-11-18 ENCOUNTER — Encounter (HOSPITAL_COMMUNITY): Payer: Self-pay

## 2017-11-20 ENCOUNTER — Encounter (HOSPITAL_COMMUNITY): Payer: Self-pay

## 2017-11-23 ENCOUNTER — Encounter (HOSPITAL_COMMUNITY)
Admission: RE | Admit: 2017-11-23 | Discharge: 2017-11-23 | Disposition: A | Discharge status: Home / Self Care | Payer: Medicare Other | Source: Ambulatory Visit | Attending: Cardiovascular Disease | Admitting: Cardiovascular Disease

## 2017-11-25 ENCOUNTER — Encounter (HOSPITAL_COMMUNITY): Payer: Self-pay

## 2017-11-27 ENCOUNTER — Encounter (HOSPITAL_COMMUNITY)
Admission: RE | Admit: 2017-11-27 | Discharge: 2017-11-27 | Disposition: A | Discharge status: Home / Self Care | Payer: Medicare Other | Source: Ambulatory Visit | Attending: Cardiovascular Disease | Admitting: Cardiovascular Disease

## 2017-12-04 ENCOUNTER — Encounter (HOSPITAL_COMMUNITY)
Admission: RE | Admit: 2017-12-04 | Discharge: 2017-12-04 | Disposition: A | Discharge status: Home / Self Care | Payer: Self-pay | Source: Ambulatory Visit | Attending: Cardiovascular Disease | Admitting: Cardiovascular Disease

## 2017-12-04 DIAGNOSIS — I480 Paroxysmal atrial fibrillation: Secondary | ICD-10-CM | POA: Insufficient documentation

## 2017-12-07 ENCOUNTER — Encounter (HOSPITAL_COMMUNITY)
Admission: RE | Admit: 2017-12-07 | Discharge: 2017-12-07 | Disposition: A | Discharge status: Home / Self Care | Payer: Self-pay | Source: Ambulatory Visit | Attending: Cardiovascular Disease | Admitting: Cardiovascular Disease

## 2017-12-09 ENCOUNTER — Encounter (HOSPITAL_COMMUNITY)
Admission: RE | Admit: 2017-12-09 | Discharge: 2017-12-09 | Disposition: A | Discharge status: Home / Self Care | Payer: Self-pay | Source: Ambulatory Visit | Attending: Cardiovascular Disease | Admitting: Cardiovascular Disease

## 2017-12-11 ENCOUNTER — Encounter (HOSPITAL_COMMUNITY): Payer: Self-pay

## 2017-12-14 ENCOUNTER — Encounter (HOSPITAL_COMMUNITY)
Admission: RE | Admit: 2017-12-14 | Discharge: 2017-12-14 | Disposition: A | Discharge status: Home / Self Care | Payer: Self-pay | Source: Ambulatory Visit | Attending: Cardiovascular Disease | Admitting: Cardiovascular Disease

## 2017-12-16 ENCOUNTER — Encounter (HOSPITAL_COMMUNITY)
Admission: RE | Admit: 2017-12-16 | Discharge: 2017-12-16 | Disposition: A | Discharge status: Home / Self Care | Payer: Self-pay | Source: Ambulatory Visit | Attending: Cardiovascular Disease | Admitting: Cardiovascular Disease

## 2017-12-18 ENCOUNTER — Encounter (HOSPITAL_COMMUNITY): Payer: Self-pay

## 2017-12-21 ENCOUNTER — Encounter (HOSPITAL_COMMUNITY)
Admission: RE | Admit: 2017-12-21 | Discharge: 2017-12-21 | Disposition: A | Discharge status: Home / Self Care | Payer: Self-pay | Source: Ambulatory Visit | Attending: Cardiovascular Disease | Admitting: Cardiovascular Disease

## 2017-12-23 ENCOUNTER — Encounter (HOSPITAL_COMMUNITY)
Admission: RE | Admit: 2017-12-23 | Discharge: 2017-12-23 | Disposition: A | Discharge status: Home / Self Care | Payer: Self-pay | Source: Ambulatory Visit | Attending: Cardiovascular Disease | Admitting: Cardiovascular Disease

## 2017-12-25 ENCOUNTER — Encounter (HOSPITAL_COMMUNITY)
Admission: RE | Admit: 2017-12-25 | Discharge: 2017-12-25 | Disposition: A | Discharge status: Home / Self Care | Payer: Self-pay | Source: Ambulatory Visit | Attending: Cardiovascular Disease | Admitting: Cardiovascular Disease

## 2017-12-28 ENCOUNTER — Encounter (HOSPITAL_COMMUNITY)
Admission: RE | Admit: 2017-12-28 | Discharge: 2017-12-28 | Disposition: A | Discharge status: Home / Self Care | Payer: Self-pay | Source: Ambulatory Visit | Attending: Cardiovascular Disease | Admitting: Cardiovascular Disease

## 2017-12-30 ENCOUNTER — Encounter (HOSPITAL_COMMUNITY): Payer: Self-pay

## 2017-12-30 DIAGNOSIS — I480 Paroxysmal atrial fibrillation: Secondary | ICD-10-CM | POA: Insufficient documentation

## 2017-12-31 ENCOUNTER — Observation Stay (HOSPITAL_COMMUNITY)
Admission: EM | Admit: 2017-12-31 | Discharge: 2018-01-01 | Disposition: A | Discharge status: Home / Self Care | Payer: Medicare Other | Attending: Internal Medicine | Admitting: Internal Medicine

## 2017-12-31 ENCOUNTER — Other Ambulatory Visit: Payer: Self-pay

## 2017-12-31 ENCOUNTER — Observation Stay (HOSPITAL_COMMUNITY): Payer: Medicare Other

## 2017-12-31 DIAGNOSIS — F329 Major depressive disorder, single episode, unspecified: Secondary | ICD-10-CM | POA: Diagnosis not present

## 2017-12-31 DIAGNOSIS — R42 Dizziness and giddiness: Secondary | ICD-10-CM | POA: Diagnosis not present

## 2017-12-31 DIAGNOSIS — I10 Essential (primary) hypertension: Secondary | ICD-10-CM | POA: Diagnosis not present

## 2017-12-31 DIAGNOSIS — Z923 Personal history of irradiation: Secondary | ICD-10-CM | POA: Diagnosis not present

## 2017-12-31 DIAGNOSIS — E860 Dehydration: Secondary | ICD-10-CM | POA: Diagnosis not present

## 2017-12-31 DIAGNOSIS — Z8249 Family history of ischemic heart disease and other diseases of the circulatory system: Secondary | ICD-10-CM | POA: Insufficient documentation

## 2017-12-31 DIAGNOSIS — Z853 Personal history of malignant neoplasm of breast: Secondary | ICD-10-CM | POA: Diagnosis not present

## 2017-12-31 DIAGNOSIS — Z886 Allergy status to analgesic agent status: Secondary | ICD-10-CM | POA: Insufficient documentation

## 2017-12-31 DIAGNOSIS — I252 Old myocardial infarction: Secondary | ICD-10-CM | POA: Diagnosis not present

## 2017-12-31 DIAGNOSIS — I5181 Takotsubo syndrome: Secondary | ICD-10-CM | POA: Diagnosis present

## 2017-12-31 DIAGNOSIS — Z87891 Personal history of nicotine dependence: Secondary | ICD-10-CM | POA: Insufficient documentation

## 2017-12-31 DIAGNOSIS — K219 Gastro-esophageal reflux disease without esophagitis: Secondary | ICD-10-CM | POA: Diagnosis not present

## 2017-12-31 DIAGNOSIS — R0902 Hypoxemia: Secondary | ICD-10-CM | POA: Diagnosis not present

## 2017-12-31 DIAGNOSIS — Z885 Allergy status to narcotic agent status: Secondary | ICD-10-CM | POA: Insufficient documentation

## 2017-12-31 DIAGNOSIS — Z7901 Long term (current) use of anticoagulants: Secondary | ICD-10-CM | POA: Diagnosis not present

## 2017-12-31 DIAGNOSIS — R4020 Unspecified coma: Secondary | ICD-10-CM | POA: Diagnosis not present

## 2017-12-31 DIAGNOSIS — R001 Bradycardia, unspecified: Secondary | ICD-10-CM | POA: Insufficient documentation

## 2017-12-31 DIAGNOSIS — E785 Hyperlipidemia, unspecified: Secondary | ICD-10-CM | POA: Diagnosis not present

## 2017-12-31 DIAGNOSIS — I2510 Atherosclerotic heart disease of native coronary artery without angina pectoris: Secondary | ICD-10-CM | POA: Diagnosis present

## 2017-12-31 DIAGNOSIS — I959 Hypotension, unspecified: Secondary | ICD-10-CM | POA: Diagnosis not present

## 2017-12-31 DIAGNOSIS — I480 Paroxysmal atrial fibrillation: Secondary | ICD-10-CM | POA: Diagnosis not present

## 2017-12-31 DIAGNOSIS — R55 Syncope and collapse: Principal | ICD-10-CM | POA: Diagnosis present

## 2017-12-31 DIAGNOSIS — M6281 Muscle weakness (generalized): Secondary | ICD-10-CM | POA: Insufficient documentation

## 2017-12-31 DIAGNOSIS — Z888 Allergy status to other drugs, medicaments and biological substances status: Secondary | ICD-10-CM | POA: Insufficient documentation

## 2017-12-31 DIAGNOSIS — M8580 Other specified disorders of bone density and structure, unspecified site: Secondary | ICD-10-CM | POA: Insufficient documentation

## 2017-12-31 DIAGNOSIS — Z79899 Other long term (current) drug therapy: Secondary | ICD-10-CM | POA: Diagnosis not present

## 2017-12-31 DIAGNOSIS — E7800 Pure hypercholesterolemia, unspecified: Secondary | ICD-10-CM

## 2017-12-31 DIAGNOSIS — Z90710 Acquired absence of both cervix and uterus: Secondary | ICD-10-CM | POA: Diagnosis not present

## 2017-12-31 LAB — CBC WITH DIFFERENTIAL/PLATELET
ABS IMMATURE GRANULOCYTES: 0 10*3/uL (ref 0.0–0.1)
Basophils Absolute: 0 10*3/uL (ref 0.0–0.1)
Basophils Relative: 0 %
EOS ABS: 0 10*3/uL (ref 0.0–0.7)
Eosinophils Relative: 0 %
HEMATOCRIT: 39.7 % (ref 36.0–46.0)
Hemoglobin: 12.8 g/dL (ref 12.0–15.0)
IMMATURE GRANULOCYTES: 0 %
LYMPHS ABS: 1.1 10*3/uL (ref 0.7–4.0)
Lymphocytes Relative: 15 %
MCH: 32.2 pg (ref 26.0–34.0)
MCHC: 32.2 g/dL (ref 30.0–36.0)
MCV: 100 fL (ref 78.0–100.0)
MONO ABS: 0.5 10*3/uL (ref 0.1–1.0)
MONOS PCT: 6 %
NEUTROS ABS: 5.7 10*3/uL (ref 1.7–7.7)
NEUTROS PCT: 79 %
Platelets: 251 10*3/uL (ref 150–400)
RBC: 3.97 MIL/uL (ref 3.87–5.11)
RDW: 12.1 % (ref 11.5–15.5)
WBC: 7.3 10*3/uL (ref 4.0–10.5)

## 2017-12-31 LAB — I-STAT TROPONIN, ED: Troponin i, poc: 0 ng/mL (ref 0.00–0.08)

## 2017-12-31 LAB — I-STAT CHEM 8, ED
BUN: 15 mg/dL (ref 8–23)
CHLORIDE: 93 mmol/L — AB (ref 98–111)
CREATININE: 0.7 mg/dL (ref 0.44–1.00)
Calcium, Ion: 1.18 mmol/L (ref 1.15–1.40)
GLUCOSE: 111 mg/dL — AB (ref 70–99)
HCT: 39 % (ref 36.0–46.0)
Hemoglobin: 13.3 g/dL (ref 12.0–15.0)
POTASSIUM: 3.6 mmol/L (ref 3.5–5.1)
Sodium: 134 mmol/L — ABNORMAL LOW (ref 135–145)
TCO2: 31 mmol/L (ref 22–32)

## 2017-12-31 LAB — BRAIN NATRIURETIC PEPTIDE: B Natriuretic Peptide: 95 pg/mL (ref 0.0–100.0)

## 2017-12-31 MED ORDER — ONDANSETRON HCL 4 MG/2ML IJ SOLN: 4.0000 mg | Freq: Four times a day (QID) | INTRAMUSCULAR | Status: DC | PRN

## 2017-12-31 MED ORDER — METOPROLOL SUCCINATE 12.5 MG HALF TABLET
12.5000 mg | ORAL_TABLET | Freq: Every day | ORAL | Status: DC
  Administered 2018-01-01: 12.5 mg via ORAL
  Filled 2017-12-31: qty 1

## 2017-12-31 MED ORDER — SENNOSIDES-DOCUSATE SODIUM 8.6-50 MG PO TABS: 1.0000 | ORAL_TABLET | Freq: Every evening | ORAL | Status: DC | PRN

## 2017-12-31 MED ORDER — LOSARTAN POTASSIUM 50 MG PO TABS
50.0000 mg | ORAL_TABLET | Freq: Every day | ORAL | Status: DC
  Administered 2018-01-01: 50 mg via ORAL
  Filled 2017-12-31: qty 1

## 2017-12-31 MED ORDER — ONDANSETRON HCL 4 MG PO TABS: 4.0000 mg | ORAL_TABLET | Freq: Four times a day (QID) | ORAL | Status: DC | PRN

## 2017-12-31 MED ORDER — ALBUTEROL SULFATE (2.5 MG/3ML) 0.083% IN NEBU: 2.5000 mg | INHALATION_SOLUTION | Freq: Four times a day (QID) | RESPIRATORY_TRACT | Status: DC | PRN

## 2017-12-31 MED ORDER — SODIUM CHLORIDE 0.9% FLUSH
3.0000 mL | Freq: Two times a day (BID) | INTRAVENOUS | Status: DC
  Administered 2017-12-31: 3 mL via INTRAVENOUS

## 2017-12-31 MED ORDER — ATORVASTATIN CALCIUM 20 MG PO TABS
20.0000 mg | ORAL_TABLET | Freq: Every day | ORAL | Status: DC
  Administered 2017-12-31: 20 mg via ORAL
  Filled 2017-12-31: qty 1

## 2017-12-31 MED ORDER — NITROGLYCERIN 0.4 MG SL SUBL: 0.4000 mg | SUBLINGUAL_TABLET | SUBLINGUAL | Status: DC | PRN

## 2017-12-31 MED ORDER — LATANOPROST 0.005 % OP SOLN
1.0000 [drp] | Freq: Every day | OPHTHALMIC | Status: DC
  Administered 2017-12-31: 1 [drp] via OPHTHALMIC
  Filled 2017-12-31: qty 2.5

## 2017-12-31 MED ORDER — APIXABAN 5 MG PO TABS
5.0000 mg | ORAL_TABLET | Freq: Two times a day (BID) | ORAL | Status: DC
  Administered 2017-12-31 – 2018-01-01 (×2): 5 mg via ORAL
  Filled 2017-12-31 (×2): qty 1

## 2017-12-31 MED ORDER — LORATADINE 10 MG PO TABS
10.0000 mg | ORAL_TABLET | Freq: Every day | ORAL | Status: DC
  Administered 2018-01-01: 10 mg via ORAL
  Filled 2017-12-31: qty 1

## 2017-12-31 MED ORDER — SODIUM CHLORIDE 0.9 % IV SOLN
INTRAVENOUS | Status: DC
  Administered 2017-12-31: 22:00:00 via INTRAVENOUS

## 2017-12-31 MED ORDER — HYDROCHLOROTHIAZIDE 12.5 MG PO CAPS
12.5000 mg | ORAL_CAPSULE | Freq: Every day | ORAL | Status: DC
  Administered 2018-01-01: 12.5 mg via ORAL
  Filled 2017-12-31: qty 1

## 2017-12-31 MED ORDER — ACETAMINOPHEN 500 MG PO TABS
500.0000 mg | ORAL_TABLET | Freq: Four times a day (QID) | ORAL | Status: DC | PRN
  Administered 2017-12-31: 500 mg via ORAL
  Filled 2017-12-31: qty 1

## 2017-12-31 NOTE — ED Triage Notes (Signed)
Pt arrived via [facility] from _____ after staff found pt unresponsive following her exercise routine. Per ems, staff reported pt was unresponsive while sitting in chair for 2 mins. Pt was awake and alert when EMS arrived but stated she was feeling lethargic. Pt denies n/v or recent illness. Pt has hx of a-fib.  EMS v/s: 151/76  cbg 111 98% ra, Hr 48-60

## 2017-12-31 NOTE — H&P (Signed)
History and Physical    Melinda [name] UYQ:[id number] DOB: [birth year] DOA: 12/31/2017  Referring MD/NP/PA:   PCP: [name], MD   Patient coming from:  The patient is coming from home.  At baseline, pt is independent for most of ADL.    Chief Complaint: syncope  HPI: Melinda [name] is a 83 y.o. female with medical history significant of hypertension, hyperlipidemia, breast cancer (s/p of bilateral lumpectomy, radiation therapy), GERD, depression, CAD, Takotsubo cardiomyopathy, atrial fibrillation on Eliquis, who presents with syncope.  Patient states that she did some mild exercise of Tai Chi, then she was sitting in the chair, suddenly passed out for few minutes at about 2:30 PM.  No fall or any injury.  She did not have any prodromal symptoms.  Denies any chest pain, palpitation, shortness of breath, unilateral weakness or numbness in extremities, slurred speech, vision change or hearing loss.  No nausea vomiting, diarrhea, abdominal pain, symptoms of UTI.  Patient states that she had mild occipital headache after the event, which has resolved. Currently patient is asymptomatic.  ED Course: pt was found to have negative troponin, WBC 7.3, electrolytes renal function okay, temperature normal, bradycardia with heart rate 40 to 50s, oxygen saturation 97% on room air. Patient is placed on telemetry bed for observation.  Review of Systems:   General: no fevers, chills, no body weight gain, no fatigue HEENT: no blurry vision, hearing changes or sore throat Respiratory: no dyspnea, coughing, wheezing CV: no chest pain, no palpitations GI: no nausea, vomiting, abdominal pain, diarrhea, constipation GU: no dysuria, burning on urination, increased urinary frequency, hematuria  Ext: no leg edema Neuro: no unilateral weakness, numbness, or tingling, no vision change or hearing loss Skin: no rash, no skin tear. MSK: No muscle spasm, no deformity, no limitation of range of movement  in spin. Had syncope and HA Heme: No easy bruising.  Travel history: No recent long distant travel.  Allergy:  Allergies  Allergen Reactions  . Ibuprofen Shortness Of Breath and Other (See Comments)    Breathing issues  . Nsaids Hives, Shortness Of Breath and Other (See Comments)    Extreme hives and difficulty breathing. "Has had to go to the ER."  . Salicylates Other (See Comments)    Extreme hives and difficulty breathing. "Has had to go to the ER."  . Codeine Nausea Only  . Etodolac Rash  . Hydrochlorothiazide Other (See Comments)    Hyponatremia    Past Medical History:  Diagnosis Date  . Allergy   . Anemia 1984   before hysterectomy  . Cancer ([place])    bilateral breast  . GERD (gastroesophageal reflux disease)   . History of blood product transfusion 1984  . Hyperlipidemia   . Hypertension   . Macular degeneration    right eye  . Myocardial infarction ([place])   . Osteopenia   . Personal history of radiation therapy   . Takotsubo cardiomyopathy     Past Surgical History:  Procedure Laterality Date  . ABDOMINAL HYSTERECTOMY  1984  . APPENDECTOMY    . BREAST BIOPSY Right 04/29/05   right   . BREAST BIOPSY Left 2006  . BREAST LUMPECTOMY Left 04/19/04   left with sentinel node  . BREAST LUMPECTOMY Right 2007  . CATARACT EXTRACTION     bilateral  . EYE SURGERY    . LEFT HEART CATHETERIZATION WITH CORONARY ANGIOGRAM N/A 07/26/2013   Procedure: LEFT HEART CATHETERIZATION WITH CORONARY ANGIOGRAM;  Surgeon: Jettie Booze, MD;  Location: [facility] CATH LAB;  Service: Cardiovascular;  Laterality: N/A;  . RETINAL LASER PROCEDURE     right    Social History:  reports that she has quit smoking. She has never used smokeless tobacco. She reports that she drinks about 3.0 standard drinks of alcohol per week. She reports that she does not use drugs.  Family History:  Family History  Problem Relation Age of Onset  . Dementia Mother   . Stroke Mother   . Heart disease Mother  59  . Cancer Father        Prostate  . Heart disease Father 50  . Cancer Maternal Aunt        Breast  . Breast cancer Maternal Aunt   . Cancer Maternal Grandmother   . Breast cancer Maternal Grandmother   . Heart attack Maternal Grandfather   . Heart attack Paternal Grandfather      Prior to Admission medications   Medication Sig Start Date End Date Taking? Authorizing Provider  acetaminophen (TYLENOL) 500 MG tablet Take 1,000 mg by mouth every 8 (eight) hours as needed (for pain, headaches, or fever).    Yes [provider]  albuterol (PROAIR HFA) 108 (90 Base) MCG/ACT inhaler Inhale 1-2 puffs into the lungs every 6 (six) hours as needed for wheezing or shortness of breath.   Yes [provider]  atorvastatin (LIPITOR) 20 MG tablet Take 20 mg by mouth at bedtime.    Yes [provider]  ELIQUIS 5 MG TABS tablet TAKE ONE TABLET BY MOUTH TWICE A DAY Patient taking differently: Take 5 mg by mouth 2 (two) times daily.  07/13/17  Yes Croitoru, Mihai, MD  fexofenadine (ALLEGRA) 180 MG tablet Take 180 mg by mouth daily.    Yes [provider]  hydrochlorothiazide (MICROZIDE) 12.5 MG capsule Take 1 capsule (12.5 mg total) by mouth daily. 12/22/16  Yes Croitoru, Mihai, MD  latanoprost (XALATAN) 0.005 % ophthalmic solution Place 1 drop into both eyes at bedtime.  06/17/13  Yes [provider]  losartan (COZAAR) 50 MG tablet Take 50 mg by mouth daily. 11/10/16  Yes [provider]  metoprolol succinate (TOPROL-XL) 25 MG 24 hr tablet Take 1 tablet (25 mg total) by mouth daily. 02/01/15  Yes Croitoru, Mihai, MD  nitroGLYCERIN (NITROSTAT) 0.4 MG SL tablet Place 1 tablet (0.4 mg total) under the tongue every 5 (five) minutes as needed for chest pain. 12/22/16  Yes Croitoru, Mihai, MD  sertraline (ZOLOFT) 25 MG tablet Take 25 mg by mouth daily as needed for anxiety. 10/05/17  Yes [provider]    Physical Exam: Vitals:   12/31/17 1700 12/31/17  1715 12/31/17 1745 12/31/17 1800  BP: (!) 148/60 (!) 133/103 (!) 138/49 (!) 135/51  Pulse: (!) 56 61 (!) 57 60  Resp: 19 16 (!) 24 19  Temp:      TempSrc:      SpO2: 97% 98% 97% 98%   General: Not in acute distress HEENT:       Eyes: PERRL, EOMI, no scleral icterus.       ENT: No discharge from the ears and nose, no pharynx injection, no tonsillar enlargement.        Neck: No JVD, no bruit, no mass felt. Heme: No neck lymph node enlargement. Cardiac: S1/S2, RRR, No murmurs, No gallops or rubs. Respiratory: No rales, wheezing, rhonchi or rubs. GI: Soft, nondistended, nontender, no rebound pain, no organomegaly, BS present. GU: No hematuria Ext: No pitting leg edema  bilaterally. 2+DP/PT pulse bilaterally. Musculoskeletal: No joint deformities, No joint redness or warmth, no limitation of ROM in spin. Skin: No rashes.  Neuro: Alert, oriented X3, cranial nerves II-XII grossly intact, moves all extremities normally. Muscle strength 5/5 in all extremities, sensation to light touch intact. Brachial reflex 2+ bilaterally. Negative Babinski's sign. Normal finger to nose test. Psych: Patient is not psychotic, no suicidal or hemocidal ideation.  Labs on Admission: I have personally reviewed following labs and imaging studies  CBC: Recent Labs  Lab 12/31/17 1738 12/31/17 1747  WBC 7.3  --   NEUTROABS 5.7  --   HGB 12.8 13.3  HCT 39.7 39.0  MCV 100.0  --   PLT 251  --    Basic Metabolic Panel: Recent Labs  Lab 12/31/17 1747  NA 134*  K 3.6  CL 93*  GLUCOSE 111*  BUN 15  CREATININE 0.70   GFR: CrCl cannot be calculated (Unknown ideal weight.). Liver Function Tests: No results for input(s): AST, ALT, ALKPHOS, BILITOT, PROT, ALBUMIN in the last 168 hours. No results for input(s): LIPASE, AMYLASE in the last 168 hours. No results for input(s): AMMONIA in the last 168 hours. Coagulation Profile: No results for input(s): INR, PROTIME in the last 168 hours. Cardiac Enzymes: No  results for input(s): CKTOTAL, CKMB, CKMBINDEX, TROPONINI in the last 168 hours. BNP (last 3 results) No results for input(s): PROBNP in the last 8760 hours. HbA1C: No results for input(s): HGBA1C in the last 72 hours. CBG: No results for input(s): GLUCAP in the last 168 hours. Lipid Profile: No results for input(s): CHOL, HDL, LDLCALC, TRIG, CHOLHDL, LDLDIRECT in the last 72 hours. Thyroid Function Tests: No results for input(s): TSH, T4TOTAL, FREET4, T3FREE, THYROIDAB in the last 72 hours. Anemia Panel: No results for input(s): VITAMINB12, FOLATE, FERRITIN, TIBC, IRON, RETICCTPCT in the last 72 hours. Urine analysis:    Component Value Date/Time   LABSPEC 1.007 04/04/2014 1720   PHURINE 7.0 04/04/2014 1720   GLUCOSEU NEGATIVE 04/04/2014 1720   HGBUR SMALL (A) 04/04/2014 1720   BILIRUBINUR NEGATIVE 04/04/2014 1720   KETONESUR NEGATIVE 04/04/2014 1720   PROTEINUR NEGATIVE 04/04/2014 1720   UROBILINOGEN 0.2 04/04/2014 1720   NITRITE NEGATIVE 04/04/2014 1720   LEUKOCYTESUR TRACE (A) 04/04/2014 1720   Sepsis Labs: _0 (procalcitonin:4,lacticidven:4) )No results found for this or any previous visit (from the past 240 hour(s)).   Radiological Exams on Admission: No results found.   EKG: Independently reviewed. Sinus bradycardia, PVC, QTC 397, low voltage, poor R-wave progression, anteroseptal infarction pattern.  Assessment/Plan Principal Problem:   Syncope Active Problems:   Essential hypertension   HLD (hyperlipidemia)   Takotsubo cardiomyopathy   CAD (coronary artery disease) - 75% ostial OM1   Paroxysmal A-fib ([facility])   Long term current use of anticoagulant   Syncope: Etiology is not clear. Patient did not have any prodromal symptoms. She had mild occipital headache, which has resolved currently. No focal neurological findings on physical examination. Patient was found to have bradycardia with heart rate 40 to 50s initially, which may have contributed partially.  Patient is taking metoprolol 25 mg daily at home, will decrease the dose of metoprolol. No seizure activity. Other differential diagnosis include TIA, othostatic status, vasovagal syncopeand and other cardiac aetiology.  - Place on tele bed for obs - Orthostatic vital signs  - MRI-brain - 2d echo - Neuro checks  - IVF: NS 75 cc/h - PT/OT eval and treat - decrease in metoprolol dose from 25--->12.5 mg daily (hold  tonight's dose)  Essential hypertension: -adjusted dose of metoprolol as above -Hold HCTZ tonight since patient needs IV fluid -Continue Cozaar -IV hydralazine when necessary  HLD (hyperlipidemia): -lipitor  Takotsubo cardiomyopathy: 2-D echo 11/04/13 showed EF 55-60% with grade 1 diastolic dysfunction. Patient does not have leg edema daily. Nose and chestexacerbation. -Continue metoprolol  CAD (coronary artery disease) - 75% ostial OM1: No CP -continue lipitor and metoprolol  Paroxysmal A-fib ([place]): CHA2DS2-VASc Score is 6, needs oral anticoagulation. Patient is on Eliquis at home. Has bradycardia. -Adjusted metoprolol dose as above -Continue Eliquis    DVT ppx: on Eliqis Code Status: Full code Family Communication: Yes, patient'shus band and the daughter at bed side Disposition Plan:  Anticipate discharge back to previous home environment Consults called:  None Admission status: Obs / tele   Date of Service 12/31/2017    Ivor Costa [facility] Pager [phone]  If 7PM-7AM, please contact night-coverage [email or web address] Password [facility] 12/31/2017, 7:47 PM

## 2017-12-31 NOTE — ED Provider Notes (Signed)
[facility] EMERGENCY DEPARTMENT Provider Note   CSN: [id number] Arrival date & time: 12/31/17  1540     History   Chief Complaint Chief Complaint  Patient presents with  . Loss of Consciousness    HPI Melinda [name] is a 83 y.o. female.  She had syncopal event while in seated position while doing Tai chi exercise medially prior to coming here.  She states "everything went gray for a minute beforehand. " She denies any chest pain no shortness of breath.  She thinks she may have had a syncopal event more than 10 years ago she denies recent changes in medicines she is presently asymptomatic.  No treatment prior to coming here.  Brought by EMS  HPI  Past Medical History:  Diagnosis Date  . Allergy   . Anemia 1984   before hysterectomy  . Cancer ([place])    bilateral breast  . GERD (gastroesophageal reflux disease)   . History of blood product transfusion 1984  . Hyperlipidemia   . Hypertension   . Macular degeneration    right eye  . Myocardial infarction ([place])   . Osteopenia   . Personal history of radiation therapy   . Takotsubo cardiomyopathy     Patient Active Problem List   Diagnosis Date Noted  . Long term current use of anticoagulant 11/26/2015  . Allergic rhinitis due to pollen 11/23/2015  . Asthmatic bronchitis 11/23/2015  . History of colon polyps 11/23/2015  . Other specified disorders of bone density and structure, other site 11/23/2015  . Glaucoma 11/23/2015  . Mood disorder ([place]) 11/23/2015  . Vitamin D deficiency 11/23/2015  . Paroxysmal atrial fibrillation ([place]) 12/08/2013  . Paroxysmal A-fib ([place]) 12/08/2013  . CAD (coronary artery disease) - 75% ostial OM1 10/21/2013  . Takotsubo cardiomyopathy   . NSTEMI (non-ST elevated myocardial infarction) ([place]) 07/25/2013  . Chest pain 09/09/2012  . GERD (gastroesophageal reflux disease) 09/09/2012  . Essential hypertension 09/09/2012  . Hyperlipidemia 09/09/2012  . hx: breast cancer,  left, invasive ductal carcinoma, right, DCIS 02/19/2011    Past Surgical History:  Procedure Laterality Date  . ABDOMINAL HYSTERECTOMY  1984  . APPENDECTOMY    . BREAST BIOPSY Right 04/29/05   right   . BREAST BIOPSY Left 2006  . BREAST LUMPECTOMY Left 04/19/04   left with sentinel node  . BREAST LUMPECTOMY Right 2007  . CATARACT EXTRACTION     bilateral  . EYE SURGERY    . LEFT HEART CATHETERIZATION WITH CORONARY ANGIOGRAM N/A 07/26/2013   Procedure: LEFT HEART CATHETERIZATION WITH CORONARY ANGIOGRAM;  Surgeon: Jettie Booze, MD;  Location: [facility] CATH LAB;  Service: Cardiovascular;  Laterality: N/A;  . RETINAL LASER PROCEDURE     right     OB History   None      Home Medications    Prior to Admission medications   Medication Sig Start Date End Date Taking? Authorizing Provider  acetaminophen (TYLENOL) 500 MG tablet Take 1,000 mg by mouth at bedtime as needed for mild pain.    [provider]  atorvastatin (LIPITOR) 20 MG tablet Take 20 mg by mouth at bedtime.     [provider]  ELIQUIS 5 MG TABS tablet TAKE ONE TABLET BY MOUTH TWICE A DAY 07/13/17   Croitoru, Mihai, MD  fexofenadine (ALLEGRA) 180 MG tablet Take 180 mg by mouth daily.     [provider]  hydrochlorothiazide (MICROZIDE) 12.5 MG capsule Take 1 capsule (12.5 mg total) by mouth  daily. 12/22/16   Croitoru, Mihai, MD  latanoprost (XALATAN) 0.005 % ophthalmic solution Place 1 drop into both eyes at bedtime.  06/17/13   [provider]  losartan (COZAAR) 50 MG tablet Take 50 mg by mouth daily. 11/10/16   [provider]  metoprolol succinate (TOPROL-XL) 25 MG 24 hr tablet Take 1 tablet (25 mg total) by mouth daily. 02/01/15   Croitoru, Mihai, MD  nitroGLYCERIN (NITROSTAT) 0.4 MG SL tablet Place 1 tablet (0.4 mg total) under the tongue every 5 (five) minutes as needed for chest pain. 12/22/16   Croitoru, Dani Gobble, MD    Family History Family History  Problem Relation Age of  Onset  . Dementia Mother   . Stroke Mother   . Heart disease Mother 64  . Cancer Father        Prostate  . Heart disease Father 50  . Cancer Maternal Aunt        Breast  . Breast cancer Maternal Aunt   . Cancer Maternal Grandmother   . Breast cancer Maternal Grandmother   . Heart attack Maternal Grandfather   . Heart attack Paternal Grandfather     Social History Social History   Tobacco Use  . Smoking status: Former Research scientist (life sciences)  . Smokeless tobacco: Never Used  Substance Use Topics  . Alcohol use: Yes    Alcohol/week: 3.0 standard drinks    Types: 3 Shots of liquor per week    Comment: 2-3 drinks containing a shot each of whiskey per night  . Drug use: No     Allergies   Ibuprofen; Nsaids; Salicylates; Codeine; Etodolac; and Hydrochlorothiazide   Review of Systems Review of Systems  Constitutional: Negative.   HENT: Negative.   Respiratory: Negative.   Cardiovascular: Negative.   Gastrointestinal: Negative.        Denies blood per rectum or black stools  Musculoskeletal: Negative.   Skin: Negative.   Neurological: Negative.   Psychiatric/Behavioral: Negative.   All other systems reviewed and are negative.    Physical Exam Updated Vital Signs BP (!) 156/76 (BP Location: Right Arm)   Pulse (!) 58   Temp 97.7 F (36.5 C) (Oral)   Resp 16   SpO2 98%   Physical Exam  Constitutional: She is oriented to person, place, and time. She appears well-developed and well-nourished.  HENT:  Head: Normocephalic and atraumatic.  Eyes: Pupils are equal, round, and reactive to light. Conjunctivae are normal.  Neck: Neck supple. No tracheal deviation present. No thyromegaly present.  Cardiovascular: Normal rate and regular rhythm.  No murmur heard. Pulmonary/Chest: Effort normal and breath sounds normal.  Abdominal: Soft. Bowel sounds are normal. She exhibits no distension. There is no tenderness.  Musculoskeletal: Normal range of motion. She exhibits no edema or  tenderness.  Neurological: She is alert and oriented to person, place, and time. Coordination normal.  Skin: Skin is warm and dry. No rash noted.  Psychiatric: She has a normal mood and affect.  Nursing note and vitals reviewed.    ED Treatments / Results  Labs (all labs ordered are listed, but only abnormal results are displayed) Labs Reviewed - No data to display  EKG EKG Interpretation  Date/Time:  Thursday December 31 2017 15:34:57 EDT Ventricular Rate:  63 PR Interval:    QRS Duration: 101 QT Interval:  449 QTC Calculation: 397 R Axis:   -6 Text Interpretation:  Sinus bradycardia Atrial premature complexes in couplets ST elevation, consider inferior injury Since last tracing rate slower Confirmed  by Orlie Dakin [phone]) on 12/31/2017 3:48:47 PM   Radiology No results found.  Procedures Procedures (including critical care time)  Medications Ordered in ED Medications - No data to display   Initial Impression / Assessment and Plan / ED Course  I have reviewed the triage vital signs and the nursing notes.  Pertinent labs & imaging results that were available during my care of the patient were reviewed by me and considered in my medical decision making (see chart for details).     Cardiac monitor shows sinus rhythm ranging from sinus bradycardia 45 bpm to normal sinus rhythm 65 bpm 7 PM patient asymptomatic appears comfortable Glasgow Coma Score 15 lab work unremarkable. Consulted Dr.Niu arrange for overnight stay Results for orders placed or performed during the hospital encounter of 12/31/17  CBC with Differential/Platelet  Result Value Ref Range   WBC 7.3 4.0 - 10.5 K/uL   RBC 3.97 3.87 - 5.11 MIL/uL   Hemoglobin 12.8 12.0 - 15.0 g/dL   HCT 39.7 36.0 - 46.0 %   MCV 100.0 78.0 - 100.0 fL   MCH 32.2 26.0 - 34.0 pg   MCHC 32.2 30.0 - 36.0 g/dL   RDW 12.1 11.5 - 15.5 %   Platelets 251 150 - 400 K/uL   Neutrophils Relative % 79 %   Neutro Abs 5.7 1.7 - 7.7  K/uL   Lymphocytes Relative 15 %   Lymphs Abs 1.1 0.7 - 4.0 K/uL   Monocytes Relative 6 %   Monocytes Absolute 0.5 0.1 - 1.0 K/uL   Eosinophils Relative 0 %   Eosinophils Absolute 0.0 0.0 - 0.7 K/uL   Basophils Relative 0 %   Basophils Absolute 0.0 0.0 - 0.1 K/uL   Immature Granulocytes 0 %   Abs Immature Granulocytes 0.0 0.0 - 0.1 K/uL  I-stat chem 8, ed  Result Value Ref Range   Sodium 134 (L) 135 - 145 mmol/L   Potassium 3.6 3.5 - 5.1 mmol/L   Chloride 93 (L) 98 - 111 mmol/L   BUN 15 8 - 23 mg/dL   Creatinine, Ser 0.70 0.44 - 1.00 mg/dL   Glucose, Bld 111 (H) 70 - 99 mg/dL   Calcium, Ion 1.18 1.15 - 1.40 mmol/L   TCO2 31 22 - 32 mmol/L   Hemoglobin 13.3 12.0 - 15.0 g/dL   HCT 39.0 36.0 - 46.0 %  I-stat troponin, ED  Result Value Ref Range   Troponin i, poc 0.00 0.00 - 0.08 ng/mL   Comment 3           No results found. Final Clinical Impressions(s) / ED Diagnoses  Diagnosis syncope Final diagnoses:  None    ED Discharge Orders    None       Orlie Dakin, MD 12/31/17 [phone]

## 2017-12-31 NOTE — ED Notes (Addendum)
Pt to MRI en route to 4e01. 4e secretary notified and will pass message to RN.

## 2018-01-01 ENCOUNTER — Encounter (HOSPITAL_COMMUNITY): Payer: Self-pay

## 2018-01-01 ENCOUNTER — Observation Stay (HOSPITAL_BASED_OUTPATIENT_CLINIC_OR_DEPARTMENT_OTHER): Payer: Medicare Other

## 2018-01-01 ENCOUNTER — Other Ambulatory Visit: Payer: Self-pay

## 2018-01-01 DIAGNOSIS — R55 Syncope and collapse: Secondary | ICD-10-CM | POA: Diagnosis not present

## 2018-01-01 DIAGNOSIS — Z7901 Long term (current) use of anticoagulants: Secondary | ICD-10-CM | POA: Diagnosis not present

## 2018-01-01 DIAGNOSIS — I10 Essential (primary) hypertension: Secondary | ICD-10-CM

## 2018-01-01 DIAGNOSIS — I351 Nonrheumatic aortic (valve) insufficiency: Secondary | ICD-10-CM | POA: Diagnosis not present

## 2018-01-01 LAB — MRSA PCR SCREENING: MRSA BY PCR: NEGATIVE

## 2018-01-01 LAB — ECHOCARDIOGRAM COMPLETE
Height: 61 in
WEIGHTICAEL: 2745.6 [oz_av]

## 2018-01-01 MED ORDER — METOPROLOL SUCCINATE ER 25 MG PO TB24: 12.5000 mg | ORAL_TABLET | Freq: Every day | ORAL | 0 refills | Status: DC

## 2018-01-01 NOTE — Discharge Summary (Signed)
Physician Discharge Summary  Melinda [name] YHO:[id number] DOB: 1934/05/20 DOA: 12/31/2017  PCP: [name], MD  Admit date: 12/31/2017 Discharge date: 01/01/2018  Admitted From: Home Disposition: Home Recommendations for Outpatient Follow-up:  1. Follow up with Cardiology early next week, for post hospital follow up, possible Holter monitor  2.  Decreased Metoprolol to 12.5 mg daily due to Bradycardia  Hold Microzide tonight due to dehydration, will resume tomorrow   Home Health: No  equipment/Devices: None Discharge Condition: Stable  CODE STATUS: FULL    Diet recommendation:   Heart Healthy  Brief/Interim Summary:  83 year old woman with a history of hypertension, hyperlipidemia, history of breast cancer status post bilateral lumpectomy, status post radiation therapy, GERD, depression, CAD, history of Takotsubo cardiomyopathy, atrial fibrillation on Eliquis, brought to the emergency department after experiencing an episode of syncope.  Patient does not have any recall of the event, which occurred after 30 minutes of performing tai chi on 12/31/2017.  She does admit that she did not drink enough fluids, and it was 95 F outside, around 2:30 PM.  Patient does not recall any prodromal symptoms.  No apparent head trauma.  No recent infections.  Upon waking up, she was essentially asymptomatic.  At the ED, troponins were negative, she had a normal white count, normal electrolytes, and she was afebrile.  She was noted to have bradycardia, with heart rate up to 50s, but normal O2 sats on room air.  She feels that her metoprolol dose, at 25 mg daily, may be "too much".  She was not orthostatic.  She denies any vasovagal symptoms.   She was given IV fluids, with improvement of her blood pressure, and rate.  Her metoprolol dose was decreased to half, 12.5 mg daily, and she has not had any other syncopal events.  2D echo shows EF 55 to 20%, normal systolic, grade 1 diastolic of note, the patient's   MRI of the brain is negative for acute findings.  Discharge Diagnoses:  Principal Problem:   Syncope Active Problems:   Essential hypertension   HLD (hyperlipidemia)   Takotsubo cardiomyopathy   CAD (coronary artery disease) - 75% ostial OM1   Paroxysmal A-fib ([facility])   Long term current use of anticoagulant  Syncope, likely due to beta blocker, dehydration.  Negative orthostatics.  Patient ambulated today without difficulty.  2D echo shows EF 55 to 60%, normal systolic, grade 1 diastolic. Discharged to home in stable condition Follow with cardiology, patient may need to be evaluated for event monitor-- ? Tachy brady as she does say her heart rate goes fast occasionally Continue decrease metoprolol dose of 12.5 mg daily Encourage hydration   Hypertension  Continue dose of metoprolol as above Hold HCTZ tonight, may resume tomorrow Continue Cozaar  History of Takotsubo cardiomyopathy, today's 2D echo showed no changes when compared to the one on 11/04/2013, with EF 55 to 15%, grade 1 diastolic.  No acute issues at this time. Continue metoprolol Follow-up with cardiology  CAD, with 75% ostial OM1, no chest pain during the hospitalization Continue aspirin, Lipitor metoprolol   Paroxysmal atrial fibrillation, CADSVASC6 , on Eliquis at home Continue Eliquis Adjusted metoprolol as above due to bradycardia  Follow with Cards next week   Depression Continue Zoloft   Discharge Instructions  Discharge Instructions    Diet - low sodium heart healthy   Complete by:  As directed    Discharge instructions   Complete by:  As directed    Follow up with Cardiology early  next week, for post hospital follow up Decreased Metoprolol to 12.5 mg daily due to Bradycardia  Hold Microzide tonight due to dehydration, will resume tomorrow   Increase activity slowly   Complete by:  As directed      Allergies as of 01/01/2018      Reactions   Ibuprofen Shortness Of Breath, Other (See Comments)    Breathing issues   Nsaids Hives, Shortness Of Breath, Other (See Comments)   Extreme hives and difficulty breathing. "Has had to go to the ER."   Salicylates Other (See Comments)   Extreme hives and difficulty breathing. "Has had to go to the ER."   Codeine Nausea Only   Etodolac Rash   Hydrochlorothiazide Other (See Comments)   Hyponatremia      Medication List    TAKE these medications   acetaminophen 500 MG tablet Commonly known as:  TYLENOL Take 1,000 mg by mouth every 8 (eight) hours as needed (for pain, headaches, or fever).   atorvastatin 20 MG tablet Commonly known as:  LIPITOR Take 20 mg by mouth at bedtime.   ELIQUIS 5 MG Tabs tablet Generic drug:  apixaban TAKE ONE TABLET BY MOUTH TWICE A DAY What changed:  how much to take   fexofenadine 180 MG tablet Commonly known as:  ALLEGRA Take 180 mg by mouth daily.   hydrochlorothiazide 12.5 MG capsule Commonly known as:  MICROZIDE Take 1 capsule (12.5 mg total) by mouth daily.   latanoprost 0.005 % ophthalmic solution Commonly known as:  XALATAN Place 1 drop into both eyes at bedtime.   losartan 50 MG tablet Commonly known as:  COZAAR Take 50 mg by mouth daily.   metoprolol succinate 25 MG 24 hr tablet Commonly known as:  TOPROL-XL Take 0.5 tablets (12.5 mg total) by mouth daily. Start taking on:  01/02/2018 What changed:  how much to take   nitroGLYCERIN 0.4 MG SL tablet Commonly known as:  NITROSTAT Place 1 tablet (0.4 mg total) under the tongue every 5 (five) minutes as needed for chest pain.   PROAIR HFA 108 (90 Base) MCG/ACT inhaler Generic drug:  albuterol Inhale 1-2 puffs into the lungs every 6 (six) hours as needed for wheezing or shortness of breath.   sertraline 25 MG tablet Commonly known as:  ZOLOFT Take 25 mg by mouth daily as needed for anxiety.      Follow-up Information    Croitoru, Mihai, MD. Schedule an appointment as soon as possible for a visit in 4 day(s).   Specialty:   Cardiology Why:  Follow up with Cardiology early next week, for post hospital follow up, possible Holter monitor   Contact information: [address] [place] [address] [phone]        [name], MD Follow up in 1 week(s).   Specialty:  Family Medicine Contact information: [address] [place] [address] [phone]          Allergies  Allergen Reactions  . Ibuprofen Shortness Of Breath and Other (See Comments)    Breathing issues  . Nsaids Hives, Shortness Of Breath and Other (See Comments)    Extreme hives and difficulty breathing. "Has had to go to the ER."  . Salicylates Other (See Comments)    Extreme hives and difficulty breathing. "Has had to go to the ER."  . Codeine Nausea Only  . Etodolac Rash  . Hydrochlorothiazide Other (See Comments)    Hyponatremia    Consultations:  None  Procedures/Studies: Mr Brain Wo Contrast  Result Date: 12/31/2017 CLINICAL DATA:  Syncopal episode while seated at [facility]. History of hypertension, hyperlipidemia, breast cancer. EXAM: MRI HEAD WITHOUT CONTRAST TECHNIQUE: Multiplanar, multiecho pulse sequences of the brain and surrounding structures were obtained without intravenous contrast. COMPARISON:  CT HEAD November 28, 2013 FINDINGS: INTRACRANIAL CONTENTS: No reduced diffusion to suggest acute ischemia. No susceptibility artifact to suggest hemorrhage. The ventricles and sulci are normal for patient's age. Patchy supratentorial white matter FLAIR T2 hyperintensities compatible with mild chronic small vessel ischemic changes, less than expected for age. No suspicious parenchymal signal, masses, mass effect. No abnormal extra-axial fluid collections. No extra-axial masses. VASCULAR: Normal major intracranial vascular flow voids present at skull base. SKULL AND UPPER CERVICAL SPINE: No abnormal sellar expansion. No suspicious calvarial bone marrow signal. Craniocervical junction maintained.  SINUSES/ORBITS: The mastoid air-cells and included paranasal sinuses are well-aerated.The included ocular globes and orbital contents are non-suspicious. Status post bilateral ocular lens implants. OTHER: None. IMPRESSION: Normal MRI head without contrast for age. Electronically Signed   By: Elon Alas M.D.   On: 12/31/2017 21:02      Subjective: No further syncopal episodes.  She denies any dizziness, or vertigo.  She denies any vision changes or headaches.  She denies any dysphagia.  No symptomatic orthostatic hypotension.  He denies any abdominal pain nausea or denies any lower extremity swelling or calf pain.  No confusion is reported.  No seizures are noted.   Discharge Exam: Vitals:   01/01/18 1100 01/01/18 1300  BP:    Pulse:    Resp: 13 (!) 27  Temp:    SpO2:     Vitals:   01/01/18 0827 01/01/18 0955 01/01/18 1100 01/01/18 1300  BP: (!) 135/51 (!) 159/80    Pulse: 78     Resp: 20  13 (!) 27  Temp: 98.1 F (36.7 C)     TempSrc: Oral     SpO2: 98%     Weight:      Height:        General: Pt is alert, awake, not in acute distress Cardiovascular: RRR, S1/S2 +, no rubs, no gallops, 2/6 systolic murmur  Respiratory: CTA bilaterally, no wheezing, no rhonchi Abdominal: Soft, NT, ND, bowel sounds + Extremities: no edema, no cyanosis    The results of significant diagnostics from this hospitalization (including imaging, microbiology, ancillary and laboratory) are listed below for reference.     Microbiology: Recent Results (from the past 240 hour(s))  MRSA PCR Screening     Status: None   Collection Time: 12/31/17 11:42 PM  Result Value Ref Range Status   MRSA by PCR NEGATIVE NEGATIVE Final    Comment:        The GeneXpert MRSA Assay (FDA approved for NASAL specimens only), is one component of a comprehensive MRSA colonization surveillance program. It is not intended to diagnose MRSA infection nor to guide or monitor treatment for MRSA  infections. Performed at [facility], [place] [address], [place], [place] [address]      Labs: BNP (last 3 results) Recent Labs    12/31/17 1738  BNP 79.0   Basic Metabolic Panel: Recent Labs  Lab 12/31/17 1747  NA 134*  K 3.6  CL 93*  GLUCOSE 111*  BUN 15  CREATININE 0.70   Liver Function Tests: No results for input(s): AST, ALT, ALKPHOS, BILITOT, PROT, ALBUMIN in the last 168 hours. No results for input(s): LIPASE, AMYLASE in the last 168  hours. No results for input(s): AMMONIA in the last 168 hours. CBC: Recent Labs  Lab 12/31/17 1738 12/31/17 1747  WBC 7.3  --   NEUTROABS 5.7  --   HGB 12.8 13.3  HCT 39.7 39.0  MCV 100.0  --   PLT 251  --    Cardiac Enzymes: No results for input(s): CKTOTAL, CKMB, CKMBINDEX, TROPONINI in the last 168 hours. BNP: Invalid input(s): POCBNP CBG: No results for input(s): GLUCAP in the last 168 hours. D-Dimer No results for input(s): DDIMER in the last 72 hours. Hgb A1c No results for input(s): HGBA1C in the last 72 hours. Lipid Profile No results for input(s): CHOL, HDL, LDLCALC, TRIG, CHOLHDL, LDLDIRECT in the last 72 hours. Thyroid function studies No results for input(s): TSH, T4TOTAL, T3FREE, THYROIDAB in the last 72 hours.  Invalid input(s): FREET3 Anemia work up No results for input(s): VITAMINB12, FOLATE, FERRITIN, TIBC, IRON, RETICCTPCT in the last 72 hours. Urinalysis    Component Value Date/Time   LABSPEC 1.007 04/04/2014 1720   PHURINE 7.0 04/04/2014 1720   GLUCOSEU NEGATIVE 04/04/2014 1720   HGBUR SMALL (A) 04/04/2014 1720   BILIRUBINUR NEGATIVE 04/04/2014 1720   KETONESUR NEGATIVE 04/04/2014 1720   PROTEINUR NEGATIVE 04/04/2014 1720   UROBILINOGEN 0.2 04/04/2014 1720   NITRITE NEGATIVE 04/04/2014 1720   LEUKOCYTESUR TRACE (A) 04/04/2014 1720   Sepsis Labs Invalid input(s): PROCALCITONIN,  WBC,  LACTICIDVEN Microbiology Recent Results (from the past 240 hour(s))  MRSA PCR Screening     Status:  None   Collection Time: 12/31/17 11:42 PM  Result Value Ref Range Status   MRSA by PCR NEGATIVE NEGATIVE Final    Comment:        The GeneXpert MRSA Assay (FDA approved for NASAL specimens only), is one component of a comprehensive MRSA colonization surveillance program. It is not intended to diagnose MRSA infection nor to guide or monitor treatment for MRSA infections. Performed at [facility], [place] [address], [place], [place] [address]      Time coordinating discharge: Over 30 minutes  SIGNED:   Geradine Girt, DO [facility] 01/01/2018, 2:43 PM   If 7PM-7AM, please contact night-coverage [email or web address] Password [email or web address]

## 2018-01-01 NOTE — Evaluation (Signed)
Physical Therapy Evaluation Patient Details Name: Melinda [name] MRN: [id number] DOB: [birth year] Today's Date: 01/01/2018   History of Present Illness  83 y.o. female with medical history significant of hypertension, hyperlipidemia, breast cancer (s/p of bilateral lumpectomy, radiation therapy), GERD, depression, CAD, Takotsubo cardiomyopathy, atrial fibrillation on Eliquis, who presents with syncope.  Clinical Impression  Pt reports to be at baseline level of functioning, modified independent for all activity. Orthostatic BP normal and and after AMB slightly higher. No acute complaint or deficits noted. No additional skilled PT services warranted at this time. Time is given to address all questions/concerns. PT signing off. PT recommends daily ambulation ad lib or with nursing staff as needed to prevent deconditioning.      Follow Up Recommendations No PT follow up    Equipment Recommendations  None recommended by PT    Recommendations for Other Services       Precautions / Restrictions Precautions Precautions: Fall Restrictions Weight Bearing Restrictions: No      Mobility  Bed Mobility Overal bed mobility: Independent             General bed mobility comments: HOB flat  Transfers Overall transfer level: Independent(3x in session) Equipment used: None Transfers: Sit to/from Stand Sit to Stand: Supervision         General transfer comment: for saftey/balance upon immediate standing, no physical assist required, no LOB noted  Ambulation/Gait Ambulation/Gait assistance: Supervision;Min guard Gait Distance (Feet): 410 Feet Assistive device: None(would typically use SPC for this distance) Gait Pattern/deviations: Antalgic Gait velocity: 0.4m/s   General Gait Details: terminal BP:159/65mmHg  Stairs            Wheelchair Mobility    Modified Rankin (Stroke Patients Only)       Balance Overall balance assessment: No apparent balance deficits  (not formally assessed)                                           Pertinent Vitals/Pain Pain Assessment: No/denies pain Faces Pain Scale: No hurt Pain Intervention(s): Monitored during session    Home Living Family/patient expects to be discharged to:: Private residence Living Arrangements: Spouse/significant other Available Help at Discharge: Family Type of Home: House Home Access: Level entry     Home Layout: One level Home Equipment: Cane - single point;Grab bars - tub/shower Additional Comments: lives at [employer]    Prior Function Level of Independence: Independent;Independent with assistive device(s)         Comments: uses SPC for longer distance ambulation, drives, performs iADLs; reports x2 falls at home since moving to [facility]     [employer]        Extremity/Trunk Assessment   Upper Extremity Assessment Upper Extremity Assessment: Overall WFL for tasks assessed    Lower Extremity Assessment Lower Extremity Assessment: Overall WFL for tasks assessed    Cervical / Trunk Assessment Cervical / Trunk Assessment: Normal  Communication   Communication: No difficulties  Cognition Arousal/Alertness: Awake/alert Behavior During Therapy: WFL for tasks assessed/performed Overall Cognitive Status: Within Functional Limits for tasks assessed                                        General Comments      Exercises     Assessment/Plan  PT Assessment Patent does not need any further PT services  PT Problem List         PT Treatment Interventions      PT Goals (Current goals can be found in the Care Plan section)  Acute Rehab PT Goals Patient Stated Goal: wants to go home today PT Goal Formulation: All assessment and education complete, DC therapy    Frequency     Barriers to discharge        Co-evaluation               AM-PAC PT "6 Clicks" Daily Activity  Outcome Measure Difficulty turning  over in bed (including adjusting bedclothes, sheets and blankets)?: None Difficulty moving from lying on back to sitting on the side of the bed? : None Difficulty sitting down on and standing up from a chair with arms (e.g., wheelchair, bedside commode, etc,.)?: None Help needed moving to and from a bed to chair (including a wheelchair)?: None Help needed walking in hospital room?: None Help needed climbing 3-5 steps with a railing? : None 6 Click Score: 24    End of Session Equipment Utilized During Treatment: Gait belt Activity Tolerance: Patient tolerated treatment well;No increased pain Patient left: in chair;with nursing/sitter in room;with family/visitor present;with call bell/phone within reach Nurse Communication: Mobility status      Time: 4158-3094 PT Time Calculation (min) (ACUTE ONLY): 23 min   Charges:   PT Evaluation $PT Eval Low Complexity: 1 Low PT Treatments $Therapeutic Activity: 8-22 mins        10:01 AM, 01/01/18 Etta Grandchild, PT, DPT Physical Therapist - [place] [phone] (Pager)  [phone] (Office)      Taleigha Pinson C 01/01/2018, 9:59 AM

## 2018-01-01 NOTE — Progress Notes (Signed)
  Echocardiogram 2D Echocardiogram has been performed.  Darlina Sicilian M 01/01/2018, 11:27 AM

## 2018-01-01 NOTE — Progress Notes (Signed)
   01/01/18 0955  Therapy Vitals  BP (!) 159/80 (p 480ft AMB )  Patient Position (if appropriate) Orthostatic Vitals  Orthostatic Lying   BP- Lying 122/50  Pulse- Lying 78  Orthostatic Sitting  BP- Sitting 139/56  Pulse- Sitting 83  Orthostatic Standing at 0 minutes  BP- Standing at 0 minutes 139/57  Pulse- Standing at 0 minutes 82  Oxygen Therapy  O2 Device Room Air   Orthostatic vitals established with Physical Therapy.  10:03 AM, 01/01/18 Etta Grandchild, PT, DPT Physical Therapist - [place] [phone] (Pager)  [phone] (Office)

## 2018-01-01 NOTE — Evaluation (Signed)
Occupational Therapy Evaluation Patient Details Name: Melinda [name] MRN: [id number] DOB: 05/24/1934 Today's Date: 01/01/2018    History of Present Illness 83 y.o. female with medical history significant of hypertension, hyperlipidemia, breast cancer (s/p of bilateral lumpectomy, radiation therapy), GERD, depression, CAD, Takotsubo cardiomyopathy, atrial fibrillation on Eliquis, who presents with syncope.   Clinical Impression   This 83 y/o female presents with the above. At baseline pt is independent with ADLs, iADLs, reports she uses [facility] for longer distance ambulation. Of note pt does report x2 falls at home since moving to their home at [facility]. Pt demonstrating room level functional mobility without AD this session; completing LB, standing grooming and toileting ADLs with overall supervision throughout. No overt LOB noted and with Pt reporting no dizziness or lightheadedness with mobility or room level activity. Pt reports feeling at her baseline regarding ADL and mobility completion. Education provided and questions answered throughout session with no further acute OT needs identified at this time. Acute OT to sign off at this time. Thank you for this referral.     Follow Up Recommendations  No OT follow up;Supervision - Intermittent    Equipment Recommendations  None recommended by OT           Precautions / Restrictions Precautions Precautions: Fall Restrictions Weight Bearing Restrictions: No      Mobility Bed Mobility Overal bed mobility: Modified Independent             General bed mobility comments: HOB elevated  Transfers Overall transfer level: Needs assistance Equipment used: None Transfers: Sit to/from Stand Sit to Stand: Supervision         General transfer comment: for saftey/balance upon immediate standing, no physical assist required, no LOB noted    Balance Overall balance assessment: Mild deficits observed, not formally tested                                          ADL either performed or assessed with clinical judgement   ADL Overall ADL's : Needs assistance/impaired Eating/Feeding: Independent   Grooming: Wash/dry hands;Wash/dry face;Oral care;Supervision/safety;Standing   Upper Body Bathing: Supervision/ safety;Sitting   Lower Body Bathing: Supervison/ safety;Sit to/from stand   Upper Body Dressing : Modified independent;Sitting   Lower Body Dressing: Supervision/safety;Sit to/from stand Lower Body Dressing Details (indicate cue type and reason): pt donning shoes seated EOB without difficulty Toilet Transfer: Supervision/safety;Ambulation;Comfort height toilet;Grab bars   Toileting- Clothing Manipulation and Hygiene: Supervision/safety Toileting - Clothing Manipulation Details (indicate cue type and reason): pt performing clothing management and peri-care without physical assist, supervision for safety     Functional mobility during ADLs: Supervision/safety;Min guard General ADL Comments: pt with no reports of dizziness with mobility or room level activity     Vision Baseline Vision/History: Wears glasses;Macular Degeneration Wears Glasses: At all times(mostly for reading/driving) Patient Visual Report: No change from baseline Vision Assessment?: No apparent visual deficits     Perception     Praxis      Pertinent Vitals/Pain Pain Assessment: Faces Faces Pain Scale: No hurt Pain Intervention(s): Monitored during session     Hand Dominance     Extremity/Trunk Assessment Upper Extremity Assessment Upper Extremity Assessment: Overall WFL for tasks assessed   Lower Extremity Assessment Lower Extremity Assessment: Defer to PT evaluation       Communication Communication Communication: No difficulties   Cognition Arousal/Alertness: Awake/alert Behavior During Therapy:  WFL for tasks assessed/performed Overall Cognitive Status: Within Functional Limits for tasks assessed                                                       Home Living Family/patient expects to be discharged to:: Private residence Living Arrangements: Spouse/significant other Available Help at Discharge: Family Type of Home: House Home Access: Level entry     Home Layout: One level     Bathroom Shower/Tub: Tub/shower unit;Walk-in shower   Bathroom Toilet: Handicapped height     Home Equipment: [place] - single point;Grab bars - tub/shower   Additional Comments: lives at [employer]      Prior Functioning/Environment Level of Independence: Independent;Independent with assistive device(s)        Comments: uses SPC for longer distance ambulation, drives, performs iADLs; reports x2 falls at home since moving to [employer]        OT Problem List: Decreased strength;Decreased activity tolerance      OT Treatment/Interventions:      OT Goals(Current goals can be found in the care plan section) Acute Rehab OT Goals Patient Stated Goal: wants to go home today OT Goal Formulation: All assessment and education complete, DC therapy  OT Frequency:     Barriers to D/C:            Co-evaluation              AM-PAC PT "6 Clicks" Daily Activity     Outcome Measure Help from another person eating meals?: None Help from another person taking care of personal grooming?: None Help from another person toileting, which includes using toliet, bedpan, or urinal?: None Help from another person bathing (including washing, rinsing, drying)?: None Help from another person to put on and taking off regular upper body clothing?: None Help from another person to put on and taking off regular lower body clothing?: None 6 Click Score: 24   End of Session Nurse Communication: Mobility status  Activity Tolerance: Patient tolerated treatment well Patient left: in bed;with call bell/phone within reach  OT Visit Diagnosis: Muscle weakness (generalized) (M62.81)                 Time: 4008-6761 OT Time Calculation (min): 23 min Charges:  OT General Charges $OT Visit: 1 Visit OT Evaluation $OT Eval Low Complexity: 1 Low  Melinda [name], OT [facility] Pager [phone] Office [phone]   Raymondo Band 01/01/2018, 9:02 AM

## 2018-01-01 NOTE — Progress Notes (Signed)
IV and telemetry discontinued at this time. CCMD notified. Discharge instructions reviewed with patient. All questions answered.   K. Starr Miaa Latterell, RN 

## 2018-01-04 ENCOUNTER — Encounter (HOSPITAL_COMMUNITY): Payer: Self-pay

## 2018-01-06 ENCOUNTER — Encounter (HOSPITAL_COMMUNITY): Payer: Self-pay

## 2018-01-07 NOTE — Progress Notes (Signed)
Cardiology Office Note:    Date:  01/08/2018   ID:  Melinda [name], DOB 06-Jul-1934, MRN [id number]  PCP:  [name], MD  Cardiologist:  No primary care provider on file.   Referring MD: [name], MD   Chief Complaint  Patient presents with  . Hospitalization Follow-up    syncope    History of Present Illness:    Melinda [name] is a 83 y.o. female with a hx of hypertension, hyperlipidemia, CAD with 75% ostial OM1 lesion , history of Takotsubo cardiomyopathy, and paroxysmal Afib on eliquis. She last saw Dr. Sallyanne Kuster in clinic on 12/22/16. At that time, she described a few episodes of rapid palpitations in 2 months that occurred in the middle of the night. Episodes were followed by increased diuresis. Taking extra lopressor PRN was discussed. She was receiving eliquis through patient assistance. She was exercising regularly in cardiac rehab.  Patient was recently hospitalized 12/2017 after syncopal event following a tai chi session in 95-degree weather.  It was thought that she was dehydrated in the setting of a beta-blocker.  Orthostatics were negative.  Echocardiogram with normal function and grade 1 diastolic dysfunction.  During her hospitalization, she does report her Lopressor was decreased to 12.5 mg daily.  She is continued on her HCTZ and losartan.  She was discharged with instructions to follow-up with cardiology for possible heart monitor.  She returns today for follow-up.  Since her discharge she has been generally asymptomatic. She describes going from a very hot environment (car in 95 degree weather) to a very cold environment (a/c in the [place] building) as the precipitous for her syncopal episode. She states that this same situation happened when she was a teenager. She decreased her dose of toprol to 12.5 mg daily as instructed at discharge, but has felt her heart rate in the 90-100s. She has had no further dizziness, syncope, or palpitations. She does  state she is under a lot of family stress at this time.    Past Medical History:  Diagnosis Date  . Allergy   . Anemia 1984   before hysterectomy  . Cancer ([place])    bilateral breast  . GERD (gastroesophageal reflux disease)   . History of blood product transfusion 1984  . Hyperlipidemia   . Hypertension   . Macular degeneration    right eye  . Myocardial infarction ([place])   . Osteopenia   . Personal history of radiation therapy   . Takotsubo cardiomyopathy     Past Surgical History:  Procedure Laterality Date  . ABDOMINAL HYSTERECTOMY  1984  . APPENDECTOMY    . BREAST BIOPSY Right 04/29/05   right   . BREAST BIOPSY Left 2006  . BREAST LUMPECTOMY Left 04/19/04   left with sentinel node  . BREAST LUMPECTOMY Right 2007  . CATARACT EXTRACTION     bilateral  . EYE SURGERY    . LEFT HEART CATHETERIZATION WITH CORONARY ANGIOGRAM N/A 07/26/2013   Procedure: LEFT HEART CATHETERIZATION WITH CORONARY ANGIOGRAM;  Surgeon: Jettie Booze, MD;  Location: [facility] CATH LAB;  Service: Cardiovascular;  Laterality: N/A;  . RETINAL LASER PROCEDURE     right    Current Medications: Current Meds  Medication Sig  . acetaminophen (TYLENOL) 500 MG tablet Take 1,000 mg by mouth every 8 (eight) hours as needed (for pain, headaches, or fever).   Marland Kitchen albuterol (PROAIR HFA) 108 (90 Base) MCG/ACT inhaler Inhale 1-2 puffs into the lungs every 6 (six) hours as needed  for wheezing or shortness of breath.  Marland Kitchen apixaban (ELIQUIS) 5 MG TABS tablet Take 1 tablet (5 mg total) by mouth 2 (two) times daily.  Marland Kitchen atorvastatin (LIPITOR) 20 MG tablet Take 20 mg by mouth at bedtime.   . cholecalciferol (VITAMIN D) 1000 units tablet Take 1,000 Units by mouth daily.  . fexofenadine (ALLEGRA) 180 MG tablet Take 180 mg by mouth daily.   . hydrochlorothiazide (MICROZIDE) 12.5 MG capsule Take 1 capsule (12.5 mg total) by mouth daily.  Marland Kitchen latanoprost (XALATAN) 0.005 % ophthalmic solution Place 1 drop into both eyes at bedtime.    Marland Kitchen losartan (COZAAR) 50 MG tablet Take 50 mg by mouth daily.  . metoprolol succinate (TOPROL-XL) 25 MG 24 hr tablet Take 1 tablet (25 mg total) by mouth daily.  . nitroGLYCERIN (NITROSTAT) 0.4 MG SL tablet Place 1 tablet (0.4 mg total) under the tongue every 5 (five) minutes as needed for chest pain.  Marland Kitchen sertraline (ZOLOFT) 25 MG tablet Take 25 mg by mouth daily as needed for anxiety.  . [DISCONTINUED] ELIQUIS 5 MG TABS tablet TAKE ONE TABLET BY MOUTH TWICE A DAY (Patient taking differently: Take 5 mg by mouth 2 (two) times daily. )  . [DISCONTINUED] metoprolol succinate (TOPROL-XL) 25 MG 24 hr tablet Take 0.5 tablets (12.5 mg total) by mouth daily.     Allergies:   Ibuprofen; Nsaids; Salicylates; Codeine; Etodolac; and Hydrochlorothiazide   Social History   Socioeconomic History  . Marital status: Married    Spouse name: Not on file  . Number of children: Not on file  . Years of education: Not on file  . Highest education level: Not on file  Occupational History  . Not on file  Social Needs  . Financial resource strain: Not on file  . Food insecurity:    Worry: Not on file    Inability: Not on file  . Transportation needs:    Medical: Not on file    Non-medical: Not on file  Tobacco Use  . Smoking status: Former Research scientist (life sciences)  . Smokeless tobacco: Never Used  Substance and Sexual Activity  . Alcohol use: Yes    Alcohol/week: 3.0 standard drinks    Types: 3 Shots of liquor per week    Comment: 2-3 drinks containing a shot each of whiskey per night  . Drug use: No  . Sexual activity: Not on file  Lifestyle  . Physical activity:    Days per week: Not on file    Minutes per session: Not on file  . Stress: Not on file  Relationships  . Social connections:    Talks on phone: Not on file    Gets together: Not on file    Attends religious service: Not on file    Active member of club or organization: Not on file    Attends meetings of clubs or organizations: Not on file     Relationship status: Not on file  Other Topics Concern  . Not on file  Social History Narrative  . Not on file     Family History: The patient's family history includes Breast cancer in her maternal aunt and maternal grandmother; Cancer in her father, maternal aunt, and maternal grandmother; Dementia in her mother; Heart attack in her maternal grandfather and paternal grandfather; Heart disease (age of onset: 63) in her father; Heart disease (age of onset: 35) in her mother; Stroke in her mother.  ROS:   Please see the history of present illness.  All other systems reviewed and are negative.  EKGs/Labs/Other Studies Reviewed:    The following studies were reviewed today:  Echo 01/01/18: Study Conclusions  - Left ventricle: The cavity size was normal. Wall thickness was   increased in a pattern of mild LVH. Systolic function was normal.   The estimated ejection fraction was in the range of 55% to 60%.   Wall motion was normal; there were no regional wall motion   abnormalities. Doppler parameters are consistent with abnormal   left ventricular relaxation (grade 1 diastolic dysfunction). The   E/e&' ratio is <8, suggesting normal LV filling pressure. - Aortic valve: Sclerosis without stenosis. Transvalvular velocity   was minimally increased. There was mild regurgitation. - Mitral valve: Mildly thickened leaflets . There was trivial   regurgitation. - Left atrium: The atrium was normal in size. - Right atrium: The atrium was at the upper limits of normal in   size. - Inferior vena cava: The vessel was normal in size. The   respirophasic diameter changes were in the normal range (>= 50%),   consistent with normal central venous pressure.  Impressions: - Compared to a prior study in 10/2013, there is now mild AI and   mildly increased aortic valve gradient without signficant   stenosis.   EKG:  EKG is  ordered today.  The ekg ordered today demonstrates sinus  rhythm  Recent Labs: 12/31/2017: B Natriuretic Peptide 95.0; BUN 15; Creatinine, Ser 0.70; Hemoglobin 13.3; Platelets 251; Potassium 3.6; Sodium 134  Recent Lipid Panel    Component Value Date/Time   CHOL 159 07/26/2013 0718   TRIG 86 07/26/2013 0718   HDL 89 07/26/2013 0718   CHOLHDL 1.8 07/26/2013 0718   VLDL 17 07/26/2013 0718   LDLCALC 53 07/26/2013 0718    Physical Exam:    VS:  BP 138/68 (BP Location: Right Arm, Patient Position: Sitting, Cuff Size: Normal)   Pulse 60   Ht _0  (1.575 m)   Wt 171 lb 3.2 oz (77.7 kg)   SpO2 98%   BMI 31.31 kg/m     Wt Readings from Last 3 Encounters:  01/08/18 171 lb 3.2 oz (77.7 kg)  12/31/17 171 lb 9.6 oz (77.8 kg)  12/22/16 171 lb (77.6 kg)     GEN:  Well nourished, well developed in no acute distress HEENT: Normal NECK: No JVD; No carotid bruits CARDIAC: RRR, no murmurs, rubs, gallops RESPIRATORY:  Clear to auscultation without rales, wheezing or rhonchi  ABDOMEN: Soft, non-tender, non-distended MUSCULOSKELETAL:  No edema; No deformity  SKIN: Warm and dry NEUROLOGIC:  Alert and oriented x 3 PSYCHIATRIC:  Normal affect   ASSESSMENT:    1. Syncope, unspecified syncope type   2. Paroxysmal atrial fibrillation (HCC)   3. Long term current use of anticoagulant   4. Coronary artery disease involving native coronary artery of native heart with other form of angina pectoris ([place])   5. Takotsubo cardiomyopathy    PLAN:    In order of problems listed above:  Syncope, unspecified syncope type She reports no further episodes of dizziness, near-syncope, or syncope. She wishes to defer heart monitor at this time. We discussed different etiologies for her syncope, including dehydration, vaso-vagal, bradycardia, rapid Afib, and heart block. She states that if she has another episode, she will wear a heart monitor. I agree with this plan. I hear no carotid bruits on exam.    Paroxysmal atrial fibrillation ([place]) - Plan: EKG  12-Lead Since decreasing her lopressor,  she has noticed an increase in her baseline heart rate up to 90-100s, although she is in the 60s today. She would like to return to her home dose of 25 mg toprol.  Long term current use of anticoagulant She is compliant on eliquis. Refills sent. This patients CHA2DS2-VASc Score and unadjusted Ischemic Stroke Rate (% per year) is equal to 7.2 % stroke rate/year from a score of 5 (female, age 60, CAD, HTN)  Coronary artery disease involving native coronary artery of native heart with other form of angina pectoris (HCC) No anginal symptoms.   Takotsubo cardiomyopathy Resolved.   Follow up with Dr. Sallyanne Kuster as scheduled.    Medication Adjustments/Labs and Tests Ordered: Current medicines are reviewed at length with the patient today.  Concerns regarding medicines are outlined above.  Orders Placed This Encounter  Procedures  . EKG 12-Lead   Meds ordered this encounter  Medications  . apixaban (ELIQUIS) 5 MG TABS tablet    Sig: Take 1 tablet (5 mg total) by mouth 2 (two) times daily.    Dispense:  180 tablet    Refill:  1  . metoprolol succinate (TOPROL-XL) 25 MG 24 hr tablet    Sig: Take 1 tablet (25 mg total) by mouth daily.    Dispense:  30 tablet    Refill:  3    Signed, Ledora Bottcher, [place]  01/08/2018 11:10 AM    [facility]

## 2018-01-08 ENCOUNTER — Encounter (HOSPITAL_COMMUNITY): Payer: Self-pay

## 2018-01-08 ENCOUNTER — Ambulatory Visit (INDEPENDENT_AMBULATORY_CARE_PROVIDER_SITE_OTHER): Payer: Medicare Other | Admitting: Physician Assistant

## 2018-01-08 ENCOUNTER — Encounter: Payer: Self-pay | Admitting: Physician Assistant

## 2018-01-08 VITALS — BP 138/68 | HR 60 | Ht 62.0 in | Wt 171.2 lb

## 2018-01-08 DIAGNOSIS — I5181 Takotsubo syndrome: Secondary | ICD-10-CM | POA: Diagnosis not present

## 2018-01-08 DIAGNOSIS — I25118 Atherosclerotic heart disease of native coronary artery with other forms of angina pectoris: Secondary | ICD-10-CM | POA: Diagnosis not present

## 2018-01-08 DIAGNOSIS — I480 Paroxysmal atrial fibrillation: Secondary | ICD-10-CM | POA: Diagnosis not present

## 2018-01-08 DIAGNOSIS — R55 Syncope and collapse: Secondary | ICD-10-CM

## 2018-01-08 DIAGNOSIS — E871 Hypo-osmolality and hyponatremia: Secondary | ICD-10-CM | POA: Diagnosis not present

## 2018-01-08 DIAGNOSIS — Z7901 Long term (current) use of anticoagulants: Secondary | ICD-10-CM | POA: Diagnosis not present

## 2018-01-08 DIAGNOSIS — R001 Bradycardia, unspecified: Secondary | ICD-10-CM | POA: Diagnosis not present

## 2018-01-08 MED ORDER — APIXABAN 5 MG PO TABS: 5.0000 mg | ORAL_TABLET | Freq: Two times a day (BID) | ORAL | 1 refills | Status: DC

## 2018-01-08 MED ORDER — METOPROLOL SUCCINATE ER 25 MG PO TB24: 25.0000 mg | ORAL_TABLET | Freq: Every day | ORAL | 3 refills | Status: DC

## 2018-01-08 NOTE — Progress Notes (Signed)
OK. For vasosvagal syncope, the first medication to stop is the diuretic, not the beta blocker. If it happens again, the HCTZ should go.Thanks [employer]

## 2018-01-08 NOTE — Patient Instructions (Signed)
Medication Instructions:  Increase Metoprolol Succinate to 25 mg daily.  If you need a refill on your cardiac medications before your next appointment, please call your pharmacy.   Lab work: NONE  If you have labs (blood work) drawn today and your tests are completely normal, you will receive your results only by: Marland Kitchen MyChart Message (if you have MyChart) OR . A paper copy in the mail If you have any lab test that is abnormal or we need to change your treatment, we will call you to review the results.  Testing/Procedures: NONE  Follow-Up: At [facility], you and your health needs are our priority.  As part of our continuing mission to provide you with exceptional heart care, we have created designated Provider Care Teams.  These Care Teams include your primary Cardiologist (physician) and Advanced Practice Providers (APPs -  Physician Assistants and Nurse Practitioners) who all work together to provide you with the care you need, when you need it. . Please keep your upcoming appointment with Dr. Sallyanne Kuster on 03/08/18.  Any Other Special Instructions Will Be Listed Below (If Applicable). NONE

## 2018-01-11 ENCOUNTER — Encounter (HOSPITAL_COMMUNITY): Payer: Self-pay

## 2018-01-13 ENCOUNTER — Encounter (HOSPITAL_COMMUNITY)
Admission: RE | Admit: 2018-01-13 | Discharge: 2018-01-13 | Disposition: A | Discharge status: Home / Self Care | Payer: Medicare Other | Source: Ambulatory Visit | Attending: Cardiovascular Disease | Admitting: Cardiovascular Disease

## 2018-01-15 ENCOUNTER — Encounter (HOSPITAL_COMMUNITY)
Admission: RE | Admit: 2018-01-15 | Discharge: 2018-01-15 | Disposition: A | Discharge status: Home / Self Care | Payer: Medicare Other | Source: Ambulatory Visit | Attending: Cardiovascular Disease | Admitting: Cardiovascular Disease

## 2018-01-15 DIAGNOSIS — Z23 Encounter for immunization: Secondary | ICD-10-CM | POA: Diagnosis not present

## 2018-01-18 ENCOUNTER — Encounter (HOSPITAL_COMMUNITY)
Admission: RE | Admit: 2018-01-18 | Discharge: 2018-01-18 | Disposition: A | Discharge status: Home / Self Care | Payer: Self-pay | Source: Ambulatory Visit | Attending: Cardiovascular Disease | Admitting: Cardiovascular Disease

## 2018-01-20 ENCOUNTER — Encounter (HOSPITAL_COMMUNITY): Payer: Self-pay

## 2018-01-22 ENCOUNTER — Encounter (HOSPITAL_COMMUNITY): Payer: Self-pay

## 2018-01-25 ENCOUNTER — Encounter (HOSPITAL_COMMUNITY)
Admission: RE | Admit: 2018-01-25 | Discharge: 2018-01-25 | Disposition: A | Discharge status: Home / Self Care | Payer: Self-pay | Source: Ambulatory Visit | Attending: Cardiovascular Disease | Admitting: Cardiovascular Disease

## 2018-01-27 ENCOUNTER — Encounter (HOSPITAL_COMMUNITY)
Admission: RE | Admit: 2018-01-27 | Discharge: 2018-01-27 | Disposition: A | Discharge status: Home / Self Care | Payer: Self-pay | Source: Ambulatory Visit | Attending: Cardiovascular Disease | Admitting: Cardiovascular Disease

## 2018-01-29 ENCOUNTER — Encounter (HOSPITAL_COMMUNITY): Payer: Self-pay

## 2018-01-29 DIAGNOSIS — I480 Paroxysmal atrial fibrillation: Secondary | ICD-10-CM | POA: Insufficient documentation

## 2018-02-01 ENCOUNTER — Encounter (HOSPITAL_COMMUNITY)
Admission: RE | Admit: 2018-02-01 | Discharge: 2018-02-01 | Disposition: A | Discharge status: Home / Self Care | Payer: Self-pay | Source: Ambulatory Visit | Attending: Cardiovascular Disease | Admitting: Cardiovascular Disease

## 2018-02-03 ENCOUNTER — Encounter (HOSPITAL_COMMUNITY): Payer: Self-pay

## 2018-02-05 ENCOUNTER — Encounter (HOSPITAL_COMMUNITY)
Admission: RE | Admit: 2018-02-05 | Discharge: 2018-02-05 | Disposition: A | Discharge status: Home / Self Care | Payer: Medicare Other | Source: Ambulatory Visit | Attending: Cardiovascular Disease | Admitting: Cardiovascular Disease

## 2018-02-05 DIAGNOSIS — H353212 Exudative age-related macular degeneration, right eye, with inactive choroidal neovascularization: Secondary | ICD-10-CM | POA: Diagnosis not present

## 2018-02-05 DIAGNOSIS — H1013 Acute atopic conjunctivitis, bilateral: Secondary | ICD-10-CM | POA: Diagnosis not present

## 2018-02-05 DIAGNOSIS — H401131 Primary open-angle glaucoma, bilateral, mild stage: Secondary | ICD-10-CM | POA: Diagnosis not present

## 2018-02-05 DIAGNOSIS — H16223 Keratoconjunctivitis sicca, not specified as Sjogren's, bilateral: Secondary | ICD-10-CM | POA: Diagnosis not present

## 2018-02-05 DIAGNOSIS — H04123 Dry eye syndrome of bilateral lacrimal glands: Secondary | ICD-10-CM | POA: Diagnosis not present

## 2018-02-08 ENCOUNTER — Encounter (HOSPITAL_COMMUNITY)
Admission: RE | Admit: 2018-02-08 | Discharge: 2018-02-08 | Disposition: A | Discharge status: Home / Self Care | Payer: Self-pay | Source: Ambulatory Visit | Attending: Cardiovascular Disease | Admitting: Cardiovascular Disease

## 2018-02-10 ENCOUNTER — Encounter (HOSPITAL_COMMUNITY): Payer: Self-pay

## 2018-02-12 ENCOUNTER — Encounter (HOSPITAL_COMMUNITY)
Admission: RE | Admit: 2018-02-12 | Discharge: 2018-02-12 | Disposition: A | Discharge status: Home / Self Care | Payer: Self-pay | Source: Ambulatory Visit | Attending: Cardiovascular Disease | Admitting: Cardiovascular Disease

## 2018-02-15 ENCOUNTER — Encounter (HOSPITAL_COMMUNITY)
Admission: RE | Admit: 2018-02-15 | Discharge: 2018-02-15 | Disposition: A | Discharge status: Home / Self Care | Payer: Medicare Other | Source: Ambulatory Visit | Attending: Cardiovascular Disease | Admitting: Cardiovascular Disease

## 2018-02-17 ENCOUNTER — Encounter (HOSPITAL_COMMUNITY): Payer: Self-pay

## 2018-02-19 ENCOUNTER — Other Ambulatory Visit: Payer: Self-pay | Admitting: Cardiovascular Disease

## 2018-02-19 ENCOUNTER — Encounter (HOSPITAL_COMMUNITY)
Admission: RE | Admit: 2018-02-19 | Discharge: 2018-02-19 | Disposition: A | Discharge status: Home / Self Care | Payer: Self-pay | Source: Ambulatory Visit | Attending: Cardiovascular Disease | Admitting: Cardiovascular Disease

## 2018-02-22 ENCOUNTER — Encounter (HOSPITAL_COMMUNITY)
Admission: RE | Admit: 2018-02-22 | Discharge: 2018-02-22 | Disposition: A | Discharge status: Home / Self Care | Payer: Self-pay | Source: Ambulatory Visit | Attending: Cardiovascular Disease | Admitting: Cardiovascular Disease

## 2018-02-24 ENCOUNTER — Encounter (HOSPITAL_COMMUNITY): Payer: Self-pay

## 2018-03-01 ENCOUNTER — Encounter (HOSPITAL_COMMUNITY): Payer: Self-pay

## 2018-03-01 DIAGNOSIS — I480 Paroxysmal atrial fibrillation: Secondary | ICD-10-CM | POA: Insufficient documentation

## 2018-03-03 ENCOUNTER — Encounter (HOSPITAL_COMMUNITY): Payer: Self-pay

## 2018-03-05 ENCOUNTER — Encounter (HOSPITAL_COMMUNITY): Payer: Self-pay

## 2018-03-08 ENCOUNTER — Ambulatory Visit (INDEPENDENT_AMBULATORY_CARE_PROVIDER_SITE_OTHER): Payer: Medicare Other | Admitting: Cardiovascular Disease

## 2018-03-08 ENCOUNTER — Encounter: Payer: Self-pay | Admitting: Cardiovascular Disease

## 2018-03-08 ENCOUNTER — Encounter (HOSPITAL_COMMUNITY)
Admission: RE | Admit: 2018-03-08 | Discharge: 2018-03-08 | Disposition: A | Discharge status: Home / Self Care | Payer: Self-pay | Source: Ambulatory Visit | Attending: Cardiovascular Disease | Admitting: Cardiovascular Disease

## 2018-03-08 VITALS — BP 153/66 | HR 66 | Ht 63.0 in | Wt 172.0 lb

## 2018-03-08 DIAGNOSIS — I480 Paroxysmal atrial fibrillation: Secondary | ICD-10-CM | POA: Diagnosis not present

## 2018-03-08 DIAGNOSIS — I2510 Atherosclerotic heart disease of native coronary artery without angina pectoris: Secondary | ICD-10-CM

## 2018-03-08 DIAGNOSIS — R55 Syncope and collapse: Secondary | ICD-10-CM

## 2018-03-08 DIAGNOSIS — E7800 Pure hypercholesterolemia, unspecified: Secondary | ICD-10-CM | POA: Diagnosis not present

## 2018-03-08 DIAGNOSIS — Z7901 Long term (current) use of anticoagulants: Secondary | ICD-10-CM

## 2018-03-08 DIAGNOSIS — I10 Essential (primary) hypertension: Secondary | ICD-10-CM | POA: Diagnosis not present

## 2018-03-08 NOTE — Progress Notes (Signed)
Cardiology Office Note    Date:  03/09/2018   ID:  Melinda [name], Melinda [name] 21-May-1934, MRN [id number]  PCP:  [name], MD  Cardiologist:   Sanda Klein, MD   No chief complaint on file.   History of Present Illness:  Melinda [name] is a 83 y.o. female with a history of paroxysmal atrial fibrillation, takotsubo cardiomyopathy with complete resolution, minor coronary artery disease with 75% stenosis of a first oblique marginal artery (asymptomatic). Last echo in October 2019 showed normal LVEF 55-60%.  Hospitalized in early October with an episode of syncope attributed to dehydration.  Her metoprolol dose was reduced to due to bradycardia.  She readily admits to not drinking enough that day and remembers it was very hot and she rolled in a a vehicle without air conditioning.  She did have prodromal dizziness, flushing and blurry vision.  She remembers sitting down.  One of the witnesses at the gym told her that she had said "I think I am about to faint" before she actually lost consciousness.  She recovered promptly after just a few seconds.  MRI of the brain was negative.  Her echocardiogram was normal with the exception of "grade 1 diastolic dysfunction".  She exercises regularly without any complaints of exertional angina or dyspnea.  She is now a resident at [facility].  She has fallen 3 times in the last 3 months and attributes this to the unfamiliar environment.  The last time she was trying to see the stars through the window and turn around in the dark and tripped over a bed.  Thankfully she did not have serious injuries with any of these falls.  She has very infrequent palpitations.  These are not associated with dyspnea, dizziness, angina or other cardiovascular complaints.  She has not had any focal neurological problems.  She has not had any overt bleeding and is compliant with apixaban anticoagulation.  Blood pressure was elevated at 153/66, after 10 minutes was down  to 140/78.  She goes to cardiac rehab on a regular basis and at her most recent visit the blood pressure was only 126/70.  Past Medical History:  Diagnosis Date  . Allergy   . Anemia 1984   before hysterectomy  . Cancer ([place])    bilateral breast  . GERD (gastroesophageal reflux disease)   . History of blood product transfusion 1984  . Hyperlipidemia   . Hypertension   . Macular degeneration    right eye  . Myocardial infarction ([place])   . Osteopenia   . Personal history of radiation therapy   . Takotsubo cardiomyopathy     Past Surgical History:  Procedure Laterality Date  . ABDOMINAL HYSTERECTOMY  1984  . APPENDECTOMY    . BREAST BIOPSY Right 04/29/05   right   . BREAST BIOPSY Left 2006  . BREAST LUMPECTOMY Left 04/19/04   left with sentinel node  . BREAST LUMPECTOMY Right 2007  . CATARACT EXTRACTION     bilateral  . EYE SURGERY    . LEFT HEART CATHETERIZATION WITH CORONARY ANGIOGRAM N/A 07/26/2013   Procedure: LEFT HEART CATHETERIZATION WITH CORONARY ANGIOGRAM;  Surgeon: Jettie Booze, MD;  Location: [facility] CATH LAB;  Service: Cardiovascular;  Laterality: N/A;  . RETINAL LASER PROCEDURE     right    Current Medications: Outpatient Medications Prior to Visit  Medication Sig Dispense Refill  . acetaminophen (TYLENOL) 500 MG tablet Take 1,000 mg by mouth every 8 (eight) hours as needed (for pain, headaches,  or fever).     Marland Kitchen albuterol (PROAIR HFA) 108 (90 Base) MCG/ACT inhaler Inhale 1-2 puffs into the lungs every 6 (six) hours as needed for wheezing or shortness of breath.    Marland Kitchen apixaban (ELIQUIS) 5 MG TABS tablet Take 1 tablet (5 mg total) by mouth 2 (two) times daily. 180 tablet 1  . atorvastatin (LIPITOR) 20 MG tablet Take 20 mg by mouth at bedtime.     . cholecalciferol (VITAMIN D) 1000 units tablet Take 1,000 Units by mouth daily.    . fexofenadine (ALLEGRA) 180 MG tablet Take 180 mg by mouth daily.     . hydrochlorothiazide (MICROZIDE) 12.5 MG capsule TAKE ONE  CAPSULE BY MOUTH DAILY 90 capsule 2  . latanoprost (XALATAN) 0.005 % ophthalmic solution Place 1 drop into both eyes at bedtime.     Marland Kitchen losartan (COZAAR) 50 MG tablet Take 50 mg by mouth daily.    . metoprolol succinate (TOPROL-XL) 25 MG 24 hr tablet Take 1 tablet (25 mg total) by mouth daily. 30 tablet 3  . nitroGLYCERIN (NITROSTAT) 0.4 MG SL tablet Place 1 tablet (0.4 mg total) under the tongue every 5 (five) minutes as needed for chest pain. 25 tablet 3  . sertraline (ZOLOFT) 25 MG tablet Take 25 mg by mouth daily as needed for anxiety.     No facility-administered medications prior to visit.      Allergies:   Ibuprofen; Nsaids; Salicylates; Codeine; Etodolac; and Hydrochlorothiazide   Social History   Socioeconomic History  . Marital status: Married    Spouse name: Not on file  . Number of children: Not on file  . Years of education: Not on file  . Highest education level: Not on file  Occupational History  . Not on file  Social Needs  . Financial resource strain: Not on file  . Food insecurity:    Worry: Not on file    Inability: Not on file  . Transportation needs:    Medical: Not on file    Non-medical: Not on file  Tobacco Use  . Smoking status: Former Research scientist (life sciences)  . Smokeless tobacco: Never Used  Substance and Sexual Activity  . Alcohol use: Yes    Alcohol/week: 3.0 standard drinks    Types: 3 Shots of liquor per week    Comment: 2-3 drinks containing a shot each of whiskey per night  . Drug use: No  . Sexual activity: Not on file  Lifestyle  . Physical activity:    Days per week: Not on file    Minutes per session: Not on file  . Stress: Not on file  Relationships  . Social connections:    Talks on phone: Not on file    Gets together: Not on file    Attends religious service: Not on file    Active member of club or organization: Not on file    Attends meetings of clubs or organizations: Not on file    Relationship status: Not on file  Other Topics Concern  .  Not on file  Social History Narrative  . Not on file     Family History:  The patient's family history includes Breast cancer in her maternal aunt and maternal grandmother; Cancer in her father, maternal aunt, and maternal grandmother; Dementia in her mother; Heart attack in her maternal grandfather and paternal grandfather; Heart disease (age of onset: 40) in her father; Heart disease (age of onset: 65) in her mother; Stroke in her mother.   ROS:  Please see the history of present illness.    ROS all other systems are reviewed and are negative   PHYSICAL EXAM:   VS:  BP (!) 153/66   Pulse 66   Ht 5\' 3"  (1.6 m)   Wt 172 lb (78 kg)   BMI 30.47 kg/m      General: Alert, oriented x3, no distress, borderline obese Head: no evidence of trauma, PERRL, EOMI, no exophtalmos or lid lag, no myxedema, no xanthelasma; normal ears, nose and oropharynx Neck: normal jugular venous pulsations and no hepatojugular reflux; brisk carotid pulses without delay and no carotid bruits Chest: clear to auscultation, no signs of consolidation by percussion or palpation, normal fremitus, symmetrical and full respiratory excursions Cardiovascular: normal position and quality of the apical impulse, regular rhythm, normal first and second heart sounds, no murmurs, rubs or gallops Abdomen: no tenderness or distention, no masses by palpation, no abnormal pulsatility or arterial bruits, normal bowel sounds, no hepatosplenomegaly Extremities: no clubbing, cyanosis or edema; 2+ radial, ulnar and brachial pulses bilaterally; 2+ right femoral, posterior tibial and dorsalis pedis pulses; 2+ left femoral, posterior tibial and dorsalis pedis pulses; no subclavian or femoral bruits Neurological: grossly nonfocal Psych: Normal mood and affect    Wt Readings from Last 3 Encounters:  03/08/18 172 lb (78 kg)  01/08/18 171 lb 3.2 oz (77.7 kg)  12/31/17 171 lb 9.6 oz (77.8 kg)      Studies/Labs Reviewed:   EKG:  EKG is  ordered today.  The ekg ordered  January 08, 2018 shows normal sinus rhythm, normal tracing Recent Labs: 09/03/2015 Glucose 95, creatinine 0.58, normal electrolytes and normal liver function tests  Lipid Panel    Component Value Date/Time   CHOL 159 07/26/2013 0718   TRIG 86 07/26/2013 0718   HDL 89 07/26/2013 0718   CHOLHDL 1.8 07/26/2013 0718   VLDL 17 07/26/2013 0718   LDLCALC 53 07/26/2013 0718    09/17/2016 Total cholesterol 166, HDL 71, LDL 79, triglycerides 80 Normal LFTs, creatinine 0.56, glucose 122, potassium 3.8  ASSESSMENT:    1. Paroxysmal atrial fibrillation (HCC)   2. Long term current use of anticoagulant   3. Vasovagal syncope   4. Atherosclerosis of native coronary artery of native heart without angina pectoris   5. Hypercholesterolemia   6. Essential hypertension      PLAN:  In order of problems listed above:  1. AFib: She has had atrial tachycardia more frequently, but clearly has also had atrial fibrillation (see ECG 12-08-2013).  None was documented around the time of her recent syncopal event. Current burden of symptoms does not justify use of true antiarrhythmics. Compliant with anticoagulation. CHADSVasc 5 (age 89, gender, HTN, CAD). 2. Eliquis: Advised to call if she experiences more falls. No bleeding complications to date. 3. Syncope: Not withstanding the details from her hospitalization, I believe the presentation was highly compatible with vasovagal syncope, in fact that all the hallmarks of that disorder.  If syncope becomes a more frequent problem I would stop the diuretic rather than the beta-blocker. 4. CAD: Incidentally discovered when she had coronary angiography for takotsubo syndrome.  She does not have angina.  She remains asymptomatic despite the fact that she is quite active, participating regularly and exercise be at the cardiac rehab program 5. HLP: On statin, usually monitored by PCP.  Parameters at goal. 6. HTN:  Well-controlled   Medication Adjustments/Labs and Tests Ordered: Current medicines are reviewed at length with the patient today.  Concerns regarding  medicines are outlined above.  Medication changes, Labs and Tests ordered today are listed in the Patient Instructions below. Patient Instructions  Medication Instructions:  Dr Sallyanne Kuster recommends that you continue on your current medications as directed. Please refer to the Current Medication list given to you today.  If you need a refill on your cardiac medications before your next appointment, please call your pharmacy.   Follow-Up: At [facility], you and your health needs are our priority.  As part of our continuing mission to provide you with exceptional heart care, we have created designated Provider Care Teams.  These Care Teams include your primary Cardiologist (physician) and Advanced Practice Providers (APPs -  Physician Assistants and Nurse Practitioners) who all work together to provide you with the care you need, when you need it. You will need a follow up appointment in 12 months.  Please call our office 2 months in advance to schedule this appointment.  You may see Sanda Klein, MD or one of the following Advanced Practice Providers on your designated Care Team: [place], [place] . Fabian Sharp, PA-C . You will receive a reminder letter in the mail two months in advance. If you don't receive a letter, please call our office to schedule the follow-up appointment.    Signed, Sanda Klein, MD  03/09/2018 10:44 AM    [facility] [place], [place], [place]  [address] Phone: [phone]; Fax: [phone]

## 2018-03-08 NOTE — Patient Instructions (Signed)
Medication Instructions:  Dr Croitoru recommends that you continue on your current medications as directed. Please refer to the Current Medication list given to you today.  If you need a refill on your cardiac medications before your next appointment, please call your pharmacy.   Follow-Up: At [facility], you and your health needs are our priority.  As part of our continuing mission to provide you with exceptional heart care, we have created designated Provider Care Teams.  These Care Teams include your primary Cardiologist (physician) and Advanced Practice Providers (APPs -  Physician Assistants and Nurse Practitioners) who all work together to provide you with the care you need, when you need it. You will need a follow up appointment in 12 months.  Please call our office 2 months in advance to schedule this appointment.  You may see Mihai Croitoru, MD or one of the following Advanced Practice Providers on your designated Care Team: Hao Meng, PA-C . Efraim Vanallen Duke, PA-C . You will receive a reminder letter in the mail two months in advance. If you don't receive a letter, please call our office to schedule the follow-up appointment. 

## 2018-03-10 ENCOUNTER — Encounter (HOSPITAL_COMMUNITY)
Admission: RE | Admit: 2018-03-10 | Discharge: 2018-03-10 | Disposition: A | Discharge status: Home / Self Care | Payer: Self-pay | Source: Ambulatory Visit | Attending: Cardiovascular Disease | Admitting: Cardiovascular Disease

## 2018-03-12 ENCOUNTER — Encounter (HOSPITAL_COMMUNITY): Payer: Self-pay

## 2018-03-12 DIAGNOSIS — H16223 Keratoconjunctivitis sicca, not specified as Sjogren's, bilateral: Secondary | ICD-10-CM | POA: Diagnosis not present

## 2018-03-12 DIAGNOSIS — H401131 Primary open-angle glaucoma, bilateral, mild stage: Secondary | ICD-10-CM | POA: Diagnosis not present

## 2018-03-12 DIAGNOSIS — H1013 Acute atopic conjunctivitis, bilateral: Secondary | ICD-10-CM | POA: Diagnosis not present

## 2018-03-12 DIAGNOSIS — H04123 Dry eye syndrome of bilateral lacrimal glands: Secondary | ICD-10-CM | POA: Diagnosis not present

## 2018-03-15 ENCOUNTER — Encounter (HOSPITAL_COMMUNITY)
Admission: RE | Admit: 2018-03-15 | Discharge: 2018-03-15 | Disposition: A | Discharge status: Home / Self Care | Payer: Self-pay | Source: Ambulatory Visit | Attending: Cardiovascular Disease | Admitting: Cardiovascular Disease

## 2018-03-17 ENCOUNTER — Encounter (HOSPITAL_COMMUNITY): Payer: Self-pay

## 2018-03-19 ENCOUNTER — Encounter (HOSPITAL_COMMUNITY): Payer: Self-pay

## 2018-03-22 ENCOUNTER — Encounter (HOSPITAL_COMMUNITY)
Admission: RE | Admit: 2018-03-22 | Discharge: 2018-03-22 | Disposition: A | Discharge status: Home / Self Care | Payer: Self-pay | Source: Ambulatory Visit | Attending: Cardiovascular Disease | Admitting: Cardiovascular Disease

## 2018-03-26 ENCOUNTER — Other Ambulatory Visit: Payer: Self-pay | Admitting: Family Medicine

## 2018-03-26 ENCOUNTER — Encounter (HOSPITAL_COMMUNITY): Payer: Self-pay

## 2018-03-26 DIAGNOSIS — Z1231 Encounter for screening mammogram for malignant neoplasm of breast: Secondary | ICD-10-CM

## 2018-03-29 ENCOUNTER — Encounter (HOSPITAL_COMMUNITY): Payer: Self-pay

## 2018-04-02 ENCOUNTER — Encounter (HOSPITAL_COMMUNITY): Payer: Self-pay

## 2018-04-02 DIAGNOSIS — I480 Paroxysmal atrial fibrillation: Secondary | ICD-10-CM | POA: Insufficient documentation

## 2018-04-05 ENCOUNTER — Encounter (HOSPITAL_COMMUNITY)
Admission: RE | Admit: 2018-04-05 | Discharge: 2018-04-05 | Disposition: A | Discharge status: Home / Self Care | Payer: Self-pay | Source: Ambulatory Visit | Attending: Cardiovascular Disease | Admitting: Cardiovascular Disease

## 2018-04-07 ENCOUNTER — Encounter (HOSPITAL_COMMUNITY): Payer: Self-pay

## 2018-04-09 ENCOUNTER — Encounter (HOSPITAL_COMMUNITY): Payer: Self-pay

## 2018-04-09 DIAGNOSIS — I2510 Atherosclerotic heart disease of native coronary artery without angina pectoris: Secondary | ICD-10-CM | POA: Diagnosis not present

## 2018-04-09 DIAGNOSIS — E7800 Pure hypercholesterolemia, unspecified: Secondary | ICD-10-CM | POA: Diagnosis not present

## 2018-04-09 DIAGNOSIS — I119 Hypertensive heart disease without heart failure: Secondary | ICD-10-CM | POA: Diagnosis not present

## 2018-04-09 DIAGNOSIS — I429 Cardiomyopathy, unspecified: Secondary | ICD-10-CM | POA: Diagnosis not present

## 2018-04-09 DIAGNOSIS — R7309 Other abnormal glucose: Secondary | ICD-10-CM | POA: Diagnosis not present

## 2018-04-12 ENCOUNTER — Encounter (HOSPITAL_COMMUNITY): Payer: Self-pay

## 2018-04-14 ENCOUNTER — Encounter (HOSPITAL_COMMUNITY): Payer: Self-pay

## 2018-04-16 ENCOUNTER — Encounter (HOSPITAL_COMMUNITY)
Admission: RE | Admit: 2018-04-16 | Discharge: 2018-04-16 | Disposition: A | Discharge status: Home / Self Care | Payer: Self-pay | Source: Ambulatory Visit | Attending: Cardiovascular Disease | Admitting: Cardiovascular Disease

## 2018-04-19 ENCOUNTER — Encounter (HOSPITAL_COMMUNITY)
Admission: RE | Admit: 2018-04-19 | Discharge: 2018-04-19 | Disposition: A | Discharge status: Home / Self Care | Payer: Self-pay | Source: Ambulatory Visit | Attending: Cardiovascular Disease | Admitting: Cardiovascular Disease

## 2018-04-21 ENCOUNTER — Encounter (HOSPITAL_COMMUNITY)
Admission: RE | Admit: 2018-04-21 | Discharge: 2018-04-21 | Disposition: A | Discharge status: Home / Self Care | Payer: Self-pay | Source: Ambulatory Visit | Attending: Cardiovascular Disease | Admitting: Cardiovascular Disease

## 2018-04-23 ENCOUNTER — Encounter (HOSPITAL_COMMUNITY)
Admission: RE | Admit: 2018-04-23 | Discharge: 2018-04-23 | Disposition: A | Discharge status: Home / Self Care | Payer: Self-pay | Source: Ambulatory Visit | Attending: Cardiovascular Disease | Admitting: Cardiovascular Disease

## 2018-04-26 ENCOUNTER — Encounter (HOSPITAL_COMMUNITY)
Admission: RE | Admit: 2018-04-26 | Discharge: 2018-04-26 | Disposition: A | Discharge status: Home / Self Care | Payer: Self-pay | Source: Ambulatory Visit | Attending: Cardiovascular Disease | Admitting: Cardiovascular Disease

## 2018-04-28 ENCOUNTER — Encounter (HOSPITAL_COMMUNITY): Payer: Self-pay

## 2018-04-29 DIAGNOSIS — H43812 Vitreous degeneration, left eye: Secondary | ICD-10-CM | POA: Diagnosis not present

## 2018-04-29 DIAGNOSIS — H35341 Macular cyst, hole, or pseudohole, right eye: Secondary | ICD-10-CM | POA: Diagnosis not present

## 2018-04-29 DIAGNOSIS — H353132 Nonexudative age-related macular degeneration, bilateral, intermediate dry stage: Secondary | ICD-10-CM | POA: Diagnosis not present

## 2018-04-29 DIAGNOSIS — H353212 Exudative age-related macular degeneration, right eye, with inactive choroidal neovascularization: Secondary | ICD-10-CM | POA: Diagnosis not present

## 2018-04-30 ENCOUNTER — Encounter (HOSPITAL_COMMUNITY)
Admission: RE | Admit: 2018-04-30 | Discharge: 2018-04-30 | Disposition: A | Discharge status: Home / Self Care | Payer: Self-pay | Source: Ambulatory Visit | Attending: Cardiovascular Disease | Admitting: Cardiovascular Disease

## 2018-04-30 DIAGNOSIS — H35373 Puckering of macula, bilateral: Secondary | ICD-10-CM | POA: Diagnosis not present

## 2018-04-30 DIAGNOSIS — H353212 Exudative age-related macular degeneration, right eye, with inactive choroidal neovascularization: Secondary | ICD-10-CM | POA: Diagnosis not present

## 2018-04-30 DIAGNOSIS — H35033 Hypertensive retinopathy, bilateral: Secondary | ICD-10-CM | POA: Diagnosis not present

## 2018-04-30 DIAGNOSIS — H401131 Primary open-angle glaucoma, bilateral, mild stage: Secondary | ICD-10-CM | POA: Diagnosis not present

## 2018-05-03 ENCOUNTER — Encounter (HOSPITAL_COMMUNITY)
Admission: RE | Admit: 2018-05-03 | Discharge: 2018-05-03 | Disposition: A | Discharge status: Home / Self Care | Payer: Self-pay | Source: Ambulatory Visit | Attending: Cardiovascular Disease | Admitting: Cardiovascular Disease

## 2018-05-03 DIAGNOSIS — I480 Paroxysmal atrial fibrillation: Secondary | ICD-10-CM | POA: Insufficient documentation

## 2018-05-05 ENCOUNTER — Encounter (HOSPITAL_COMMUNITY): Payer: Self-pay

## 2018-05-06 ENCOUNTER — Ambulatory Visit
Admission: RE | Admit: 2018-05-06 | Discharge: 2018-05-06 | Disposition: A | Discharge status: Home / Self Care | Payer: Medicare Other | Source: Ambulatory Visit | Attending: Family Medicine | Admitting: Family Medicine

## 2018-05-06 DIAGNOSIS — Z1231 Encounter for screening mammogram for malignant neoplasm of breast: Secondary | ICD-10-CM

## 2018-05-06 HISTORY — DX: Malignant neoplasm of unspecified site of unspecified female breast: C50.919

## 2018-05-07 ENCOUNTER — Encounter (HOSPITAL_COMMUNITY): Payer: Self-pay

## 2018-05-10 ENCOUNTER — Encounter (HOSPITAL_COMMUNITY)
Admission: RE | Admit: 2018-05-10 | Discharge: 2018-05-10 | Disposition: A | Discharge status: Home / Self Care | Payer: Medicare Other | Source: Ambulatory Visit | Attending: Cardiovascular Disease | Admitting: Cardiovascular Disease

## 2018-05-12 ENCOUNTER — Encounter (HOSPITAL_COMMUNITY)
Admission: RE | Admit: 2018-05-12 | Discharge: 2018-05-12 | Disposition: A | Discharge status: Home / Self Care | Payer: Self-pay | Source: Ambulatory Visit | Attending: Cardiovascular Disease | Admitting: Cardiovascular Disease

## 2018-05-14 ENCOUNTER — Encounter (HOSPITAL_COMMUNITY): Payer: Self-pay

## 2018-05-17 ENCOUNTER — Encounter (HOSPITAL_COMMUNITY)
Admission: RE | Admit: 2018-05-17 | Discharge: 2018-05-17 | Disposition: A | Discharge status: Home / Self Care | Payer: Self-pay | Source: Ambulatory Visit | Attending: Cardiovascular Disease | Admitting: Cardiovascular Disease

## 2018-05-19 ENCOUNTER — Encounter (HOSPITAL_COMMUNITY): Payer: Self-pay

## 2018-05-21 ENCOUNTER — Encounter (HOSPITAL_COMMUNITY): Payer: Self-pay

## 2018-05-24 ENCOUNTER — Encounter (HOSPITAL_COMMUNITY)
Admission: RE | Admit: 2018-05-24 | Discharge: 2018-05-24 | Disposition: A | Discharge status: Home / Self Care | Payer: Self-pay | Source: Ambulatory Visit | Attending: Cardiovascular Disease | Admitting: Cardiovascular Disease

## 2018-05-26 ENCOUNTER — Encounter (HOSPITAL_COMMUNITY)
Admission: RE | Admit: 2018-05-26 | Discharge: 2018-05-26 | Disposition: A | Discharge status: Home / Self Care | Payer: Self-pay | Source: Ambulatory Visit | Attending: Cardiovascular Disease | Admitting: Cardiovascular Disease

## 2018-05-28 ENCOUNTER — Encounter (HOSPITAL_COMMUNITY): Payer: Self-pay

## 2018-05-31 ENCOUNTER — Encounter (HOSPITAL_COMMUNITY)
Admission: RE | Admit: 2018-05-31 | Discharge: 2018-05-31 | Disposition: A | Discharge status: Home / Self Care | Payer: Self-pay | Source: Ambulatory Visit | Attending: Cardiovascular Disease | Admitting: Cardiovascular Disease

## 2018-05-31 DIAGNOSIS — I2510 Atherosclerotic heart disease of native coronary artery without angina pectoris: Secondary | ICD-10-CM | POA: Insufficient documentation

## 2018-05-31 DIAGNOSIS — Z539 Procedure and treatment not carried out, unspecified reason: Secondary | ICD-10-CM | POA: Insufficient documentation

## 2018-06-02 ENCOUNTER — Encounter (HOSPITAL_COMMUNITY): Payer: Self-pay

## 2018-06-04 ENCOUNTER — Encounter (HOSPITAL_COMMUNITY): Payer: Self-pay

## 2018-06-07 ENCOUNTER — Encounter (HOSPITAL_COMMUNITY): Payer: Self-pay

## 2018-06-09 ENCOUNTER — Encounter (HOSPITAL_COMMUNITY): Payer: Self-pay

## 2018-06-11 ENCOUNTER — Encounter (HOSPITAL_COMMUNITY)
Admission: RE | Admit: 2018-06-11 | Discharge: 2018-06-11 | Disposition: A | Discharge status: Home / Self Care | Payer: Self-pay | Source: Ambulatory Visit | Attending: Cardiovascular Disease | Admitting: Cardiovascular Disease

## 2018-06-11 ENCOUNTER — Other Ambulatory Visit: Payer: Self-pay

## 2018-06-14 ENCOUNTER — Telehealth (HOSPITAL_COMMUNITY): Payer: Self-pay | Admitting: Cardiac Rehabilitation

## 2018-06-14 ENCOUNTER — Encounter (HOSPITAL_COMMUNITY): Payer: Self-pay

## 2018-06-14 NOTE — Telephone Encounter (Signed)
Phone call to patient to notify of CR Phase II departmental closing for 2 weeks.  Pt verbalized understanding.  Marilene Vath, RN, BSN Cardiac Pulmonary Rehab  

## 2018-06-16 ENCOUNTER — Encounter (HOSPITAL_COMMUNITY): Payer: Self-pay

## 2018-06-18 ENCOUNTER — Encounter (HOSPITAL_COMMUNITY): Payer: Self-pay

## 2018-06-21 ENCOUNTER — Encounter (HOSPITAL_COMMUNITY): Payer: Self-pay

## 2018-06-22 ENCOUNTER — Telehealth (HOSPITAL_COMMUNITY): Payer: Self-pay | Admitting: Family Medicine

## 2018-06-23 ENCOUNTER — Encounter (HOSPITAL_COMMUNITY): Payer: Self-pay

## 2018-06-25 ENCOUNTER — Encounter (HOSPITAL_COMMUNITY): Payer: Self-pay

## 2018-06-28 ENCOUNTER — Encounter (HOSPITAL_COMMUNITY): Payer: Self-pay

## 2018-06-30 ENCOUNTER — Encounter (HOSPITAL_COMMUNITY): Payer: Self-pay

## 2018-07-02 ENCOUNTER — Encounter (HOSPITAL_COMMUNITY): Payer: Self-pay

## 2018-07-05 ENCOUNTER — Encounter (HOSPITAL_COMMUNITY): Payer: Self-pay

## 2018-07-07 ENCOUNTER — Encounter (HOSPITAL_COMMUNITY): Payer: Self-pay

## 2018-07-07 ENCOUNTER — Telehealth: Payer: Self-pay

## 2018-07-07 MED ORDER — APIXABAN 5 MG PO TABS: 5.0000 mg | ORAL_TABLET | Freq: Two times a day (BID) | ORAL | 1 refills | Status: DC

## 2018-07-07 NOTE — Telephone Encounter (Signed)
83yo, 78kg, Scr 0.55 on 04/09/18 I KPN Last OV 03/08/18 Indication: afib

## 2018-07-09 ENCOUNTER — Telehealth (HOSPITAL_COMMUNITY): Payer: Self-pay | Admitting: *Deleted

## 2018-07-09 ENCOUNTER — Encounter (HOSPITAL_COMMUNITY): Payer: Self-pay

## 2018-07-09 NOTE — Telephone Encounter (Signed)
Called to notify patient that the cardiac and pulmonary rehabilitation department remains closed at this time due to COVID-19 restrictions. Pt verbalized understanding.  Sol Passer, MS, ACSM CEP 07/09/2018 1017

## 2018-07-12 ENCOUNTER — Encounter (HOSPITAL_COMMUNITY): Payer: Self-pay

## 2018-07-14 ENCOUNTER — Encounter (HOSPITAL_COMMUNITY): Payer: Self-pay

## 2018-07-16 ENCOUNTER — Encounter (HOSPITAL_COMMUNITY): Payer: Self-pay

## 2018-07-19 ENCOUNTER — Encounter (HOSPITAL_COMMUNITY): Payer: Self-pay

## 2018-07-21 ENCOUNTER — Encounter (HOSPITAL_COMMUNITY): Payer: Self-pay

## 2018-07-23 ENCOUNTER — Encounter (HOSPITAL_COMMUNITY): Payer: Self-pay

## 2018-07-26 ENCOUNTER — Encounter (HOSPITAL_COMMUNITY): Payer: Self-pay

## 2018-07-28 ENCOUNTER — Encounter (HOSPITAL_COMMUNITY): Payer: Self-pay

## 2018-07-30 ENCOUNTER — Encounter (HOSPITAL_COMMUNITY): Payer: Self-pay

## 2018-08-02 ENCOUNTER — Encounter (HOSPITAL_COMMUNITY): Payer: Self-pay

## 2018-08-04 ENCOUNTER — Encounter (HOSPITAL_COMMUNITY): Payer: Self-pay

## 2018-08-06 ENCOUNTER — Encounter (HOSPITAL_COMMUNITY): Payer: Self-pay

## 2018-08-09 ENCOUNTER — Encounter (HOSPITAL_COMMUNITY): Payer: Self-pay

## 2018-08-11 ENCOUNTER — Encounter (HOSPITAL_COMMUNITY): Payer: Self-pay

## 2018-08-13 ENCOUNTER — Encounter (HOSPITAL_COMMUNITY): Payer: Self-pay

## 2018-08-16 ENCOUNTER — Encounter (HOSPITAL_COMMUNITY): Payer: Self-pay

## 2018-08-18 ENCOUNTER — Encounter (HOSPITAL_COMMUNITY): Payer: Self-pay

## 2018-08-20 ENCOUNTER — Encounter (HOSPITAL_COMMUNITY): Payer: Self-pay

## 2018-08-25 ENCOUNTER — Encounter (HOSPITAL_COMMUNITY): Payer: Self-pay

## 2018-08-27 ENCOUNTER — Encounter (HOSPITAL_COMMUNITY): Payer: Self-pay

## 2018-08-30 ENCOUNTER — Encounter (HOSPITAL_COMMUNITY): Payer: Self-pay

## 2018-09-01 ENCOUNTER — Encounter (HOSPITAL_COMMUNITY): Payer: Self-pay

## 2018-09-03 ENCOUNTER — Encounter (HOSPITAL_COMMUNITY): Payer: Self-pay

## 2018-09-06 ENCOUNTER — Encounter (HOSPITAL_COMMUNITY): Payer: Self-pay

## 2018-09-08 ENCOUNTER — Encounter (HOSPITAL_COMMUNITY): Payer: Self-pay

## 2018-09-10 ENCOUNTER — Encounter (HOSPITAL_COMMUNITY): Payer: Self-pay

## 2018-09-13 ENCOUNTER — Encounter (HOSPITAL_COMMUNITY): Payer: Self-pay

## 2018-09-15 ENCOUNTER — Encounter (HOSPITAL_COMMUNITY): Payer: Self-pay

## 2018-09-17 ENCOUNTER — Encounter (HOSPITAL_COMMUNITY): Payer: Self-pay

## 2018-09-20 ENCOUNTER — Encounter (HOSPITAL_COMMUNITY): Payer: Self-pay

## 2018-09-22 ENCOUNTER — Encounter (HOSPITAL_COMMUNITY): Payer: Self-pay

## 2018-09-24 ENCOUNTER — Encounter (HOSPITAL_COMMUNITY): Payer: Self-pay

## 2018-09-27 ENCOUNTER — Encounter (HOSPITAL_COMMUNITY): Payer: Self-pay

## 2018-09-28 ENCOUNTER — Telehealth (HOSPITAL_COMMUNITY): Payer: Self-pay

## 2018-09-29 ENCOUNTER — Encounter (HOSPITAL_COMMUNITY): Payer: Self-pay

## 2018-10-04 ENCOUNTER — Encounter (HOSPITAL_COMMUNITY): Payer: Self-pay

## 2018-10-06 ENCOUNTER — Encounter (HOSPITAL_COMMUNITY): Payer: Self-pay

## 2018-10-08 ENCOUNTER — Encounter (HOSPITAL_COMMUNITY): Payer: Self-pay

## 2018-10-11 ENCOUNTER — Encounter (HOSPITAL_COMMUNITY): Payer: Self-pay

## 2018-10-13 ENCOUNTER — Encounter (HOSPITAL_COMMUNITY): Payer: Self-pay

## 2018-10-15 ENCOUNTER — Encounter (HOSPITAL_COMMUNITY): Payer: Self-pay

## 2018-10-18 ENCOUNTER — Encounter (HOSPITAL_COMMUNITY): Payer: Self-pay

## 2018-10-20 ENCOUNTER — Encounter (HOSPITAL_COMMUNITY): Payer: Self-pay

## 2018-10-22 ENCOUNTER — Encounter (HOSPITAL_COMMUNITY): Payer: Self-pay

## 2018-10-25 ENCOUNTER — Encounter (HOSPITAL_COMMUNITY): Payer: Self-pay

## 2018-10-27 ENCOUNTER — Encounter (HOSPITAL_COMMUNITY): Payer: Self-pay

## 2018-10-27 DIAGNOSIS — H04123 Dry eye syndrome of bilateral lacrimal glands: Secondary | ICD-10-CM | POA: Diagnosis not present

## 2018-10-27 DIAGNOSIS — H1045 Other chronic allergic conjunctivitis: Secondary | ICD-10-CM | POA: Diagnosis not present

## 2018-10-27 DIAGNOSIS — H401131 Primary open-angle glaucoma, bilateral, mild stage: Secondary | ICD-10-CM | POA: Diagnosis not present

## 2018-10-27 DIAGNOSIS — H16223 Keratoconjunctivitis sicca, not specified as Sjogren's, bilateral: Secondary | ICD-10-CM | POA: Diagnosis not present

## 2018-10-29 ENCOUNTER — Encounter (HOSPITAL_COMMUNITY): Payer: Self-pay

## 2018-11-01 ENCOUNTER — Encounter (HOSPITAL_COMMUNITY): Payer: Self-pay

## 2018-11-03 ENCOUNTER — Encounter (HOSPITAL_COMMUNITY): Payer: Self-pay

## 2018-11-05 ENCOUNTER — Encounter (HOSPITAL_COMMUNITY): Payer: Self-pay

## 2018-11-08 ENCOUNTER — Encounter (HOSPITAL_COMMUNITY): Payer: Self-pay

## 2018-11-10 ENCOUNTER — Encounter (HOSPITAL_COMMUNITY): Payer: Self-pay

## 2018-11-12 ENCOUNTER — Encounter (HOSPITAL_COMMUNITY): Payer: Self-pay

## 2018-11-15 ENCOUNTER — Encounter (HOSPITAL_COMMUNITY): Payer: Self-pay

## 2018-11-17 ENCOUNTER — Encounter (HOSPITAL_COMMUNITY): Payer: Self-pay

## 2018-11-19 ENCOUNTER — Encounter (HOSPITAL_COMMUNITY): Payer: Self-pay

## 2018-11-22 ENCOUNTER — Encounter (HOSPITAL_COMMUNITY): Payer: Self-pay

## 2018-11-24 ENCOUNTER — Encounter (HOSPITAL_COMMUNITY): Payer: Self-pay

## 2018-11-24 DIAGNOSIS — I429 Cardiomyopathy, unspecified: Secondary | ICD-10-CM | POA: Diagnosis not present

## 2018-11-24 DIAGNOSIS — J45909 Unspecified asthma, uncomplicated: Secondary | ICD-10-CM | POA: Diagnosis not present

## 2018-11-24 DIAGNOSIS — E559 Vitamin D deficiency, unspecified: Secondary | ICD-10-CM | POA: Diagnosis not present

## 2018-11-24 DIAGNOSIS — E7800 Pure hypercholesterolemia, unspecified: Secondary | ICD-10-CM | POA: Diagnosis not present

## 2018-11-24 DIAGNOSIS — F39 Unspecified mood [affective] disorder: Secondary | ICD-10-CM | POA: Diagnosis not present

## 2018-11-24 DIAGNOSIS — Z1389 Encounter for screening for other disorder: Secondary | ICD-10-CM | POA: Diagnosis not present

## 2018-11-24 DIAGNOSIS — J301 Allergic rhinitis due to pollen: Secondary | ICD-10-CM | POA: Diagnosis not present

## 2018-11-24 DIAGNOSIS — I4891 Unspecified atrial fibrillation: Secondary | ICD-10-CM | POA: Diagnosis not present

## 2018-11-24 DIAGNOSIS — R7303 Prediabetes: Secondary | ICD-10-CM | POA: Diagnosis not present

## 2018-11-24 DIAGNOSIS — I2510 Atherosclerotic heart disease of native coronary artery without angina pectoris: Secondary | ICD-10-CM | POA: Diagnosis not present

## 2018-11-24 DIAGNOSIS — I119 Hypertensive heart disease without heart failure: Secondary | ICD-10-CM | POA: Diagnosis not present

## 2018-11-24 DIAGNOSIS — Z0000 Encounter for general adult medical examination without abnormal findings: Secondary | ICD-10-CM | POA: Diagnosis not present

## 2018-11-26 ENCOUNTER — Encounter (HOSPITAL_COMMUNITY): Payer: Self-pay

## 2018-11-29 ENCOUNTER — Encounter (HOSPITAL_COMMUNITY): Payer: Self-pay

## 2018-11-29 DIAGNOSIS — H35373 Puckering of macula, bilateral: Secondary | ICD-10-CM | POA: Diagnosis not present

## 2018-11-29 DIAGNOSIS — H35033 Hypertensive retinopathy, bilateral: Secondary | ICD-10-CM | POA: Diagnosis not present

## 2018-11-29 DIAGNOSIS — H353212 Exudative age-related macular degeneration, right eye, with inactive choroidal neovascularization: Secondary | ICD-10-CM | POA: Diagnosis not present

## 2018-11-29 DIAGNOSIS — H401131 Primary open-angle glaucoma, bilateral, mild stage: Secondary | ICD-10-CM | POA: Diagnosis not present

## 2018-12-02 DIAGNOSIS — E559 Vitamin D deficiency, unspecified: Secondary | ICD-10-CM | POA: Diagnosis not present

## 2018-12-02 DIAGNOSIS — Z23 Encounter for immunization: Secondary | ICD-10-CM | POA: Diagnosis not present

## 2018-12-02 DIAGNOSIS — R7303 Prediabetes: Secondary | ICD-10-CM | POA: Diagnosis not present

## 2018-12-02 DIAGNOSIS — E7800 Pure hypercholesterolemia, unspecified: Secondary | ICD-10-CM | POA: Diagnosis not present

## 2018-12-22 ENCOUNTER — Other Ambulatory Visit: Payer: Self-pay | Admitting: Cardiovascular Disease

## 2018-12-22 NOTE — Telephone Encounter (Signed)
11f 78kg Scr 0.59 12/02/18 Lovw/croitoru 03/08/18

## 2019-01-31 DIAGNOSIS — H40051 Ocular hypertension, right eye: Secondary | ICD-10-CM | POA: Diagnosis not present

## 2019-01-31 DIAGNOSIS — H16223 Keratoconjunctivitis sicca, not specified as Sjogren's, bilateral: Secondary | ICD-10-CM | POA: Diagnosis not present

## 2019-01-31 DIAGNOSIS — H401131 Primary open-angle glaucoma, bilateral, mild stage: Secondary | ICD-10-CM | POA: Diagnosis not present

## 2019-01-31 DIAGNOSIS — H04123 Dry eye syndrome of bilateral lacrimal glands: Secondary | ICD-10-CM | POA: Diagnosis not present

## 2019-02-09 DIAGNOSIS — H40051 Ocular hypertension, right eye: Secondary | ICD-10-CM | POA: Diagnosis not present

## 2019-02-09 DIAGNOSIS — H401131 Primary open-angle glaucoma, bilateral, mild stage: Secondary | ICD-10-CM | POA: Diagnosis not present

## 2019-03-04 ENCOUNTER — Other Ambulatory Visit: Payer: Self-pay

## 2019-03-04 ENCOUNTER — Emergency Department (HOSPITAL_COMMUNITY): Payer: Medicare Other

## 2019-03-04 ENCOUNTER — Observation Stay (HOSPITAL_COMMUNITY): Payer: Medicare Other

## 2019-03-04 ENCOUNTER — Observation Stay (HOSPITAL_COMMUNITY)
Admission: EM | Admit: 2019-03-04 | Discharge: 2019-03-05 | Disposition: A | Discharge status: Home / Self Care | Payer: Medicare Other | Attending: Internal Medicine | Admitting: Internal Medicine

## 2019-03-04 ENCOUNTER — Observation Stay (HOSPITAL_BASED_OUTPATIENT_CLINIC_OR_DEPARTMENT_OTHER): Payer: Medicare Other

## 2019-03-04 ENCOUNTER — Encounter (HOSPITAL_COMMUNITY): Payer: Self-pay | Admitting: Emergency Medicine

## 2019-03-04 DIAGNOSIS — I351 Nonrheumatic aortic (valve) insufficiency: Secondary | ICD-10-CM

## 2019-03-04 DIAGNOSIS — I2510 Atherosclerotic heart disease of native coronary artery without angina pectoris: Secondary | ICD-10-CM | POA: Diagnosis not present

## 2019-03-04 DIAGNOSIS — F1010 Alcohol abuse, uncomplicated: Secondary | ICD-10-CM | POA: Diagnosis present

## 2019-03-04 DIAGNOSIS — E860 Dehydration: Secondary | ICD-10-CM | POA: Diagnosis not present

## 2019-03-04 DIAGNOSIS — Z885 Allergy status to narcotic agent status: Secondary | ICD-10-CM | POA: Diagnosis not present

## 2019-03-04 DIAGNOSIS — Y92002 Bathroom of unspecified non-institutional (private) residence single-family (private) house as the place of occurrence of the external cause: Secondary | ICD-10-CM | POA: Insufficient documentation

## 2019-03-04 DIAGNOSIS — F1020 Alcohol dependence, uncomplicated: Secondary | ICD-10-CM | POA: Diagnosis not present

## 2019-03-04 DIAGNOSIS — R52 Pain, unspecified: Secondary | ICD-10-CM | POA: Diagnosis not present

## 2019-03-04 DIAGNOSIS — W19XXXA Unspecified fall, initial encounter: Secondary | ICD-10-CM | POA: Diagnosis not present

## 2019-03-04 DIAGNOSIS — Z20828 Contact with and (suspected) exposure to other viral communicable diseases: Secondary | ICD-10-CM | POA: Insufficient documentation

## 2019-03-04 DIAGNOSIS — S79911A Unspecified injury of right hip, initial encounter: Secondary | ICD-10-CM | POA: Diagnosis not present

## 2019-03-04 DIAGNOSIS — S62111A Displaced fracture of triquetrum [cuneiform] bone, right wrist, initial encounter for closed fracture: Secondary | ICD-10-CM

## 2019-03-04 DIAGNOSIS — I350 Nonrheumatic aortic (valve) stenosis: Secondary | ICD-10-CM

## 2019-03-04 DIAGNOSIS — M25551 Pain in right hip: Secondary | ICD-10-CM | POA: Diagnosis not present

## 2019-03-04 DIAGNOSIS — Z886 Allergy status to analgesic agent status: Secondary | ICD-10-CM | POA: Insufficient documentation

## 2019-03-04 DIAGNOSIS — I252 Old myocardial infarction: Secondary | ICD-10-CM | POA: Diagnosis not present

## 2019-03-04 DIAGNOSIS — W1830XA Fall on same level, unspecified, initial encounter: Secondary | ICD-10-CM | POA: Diagnosis not present

## 2019-03-04 DIAGNOSIS — Z888 Allergy status to other drugs, medicaments and biological substances status: Secondary | ICD-10-CM | POA: Diagnosis not present

## 2019-03-04 DIAGNOSIS — Z853 Personal history of malignant neoplasm of breast: Secondary | ICD-10-CM | POA: Insufficient documentation

## 2019-03-04 DIAGNOSIS — F329 Major depressive disorder, single episode, unspecified: Secondary | ICD-10-CM | POA: Diagnosis not present

## 2019-03-04 DIAGNOSIS — M542 Cervicalgia: Secondary | ICD-10-CM | POA: Diagnosis not present

## 2019-03-04 DIAGNOSIS — E7800 Pure hypercholesterolemia, unspecified: Secondary | ICD-10-CM | POA: Insufficient documentation

## 2019-03-04 DIAGNOSIS — Z7901 Long term (current) use of anticoagulants: Secondary | ICD-10-CM | POA: Diagnosis not present

## 2019-03-04 DIAGNOSIS — S199XXA Unspecified injury of neck, initial encounter: Secondary | ICD-10-CM | POA: Diagnosis not present

## 2019-03-04 DIAGNOSIS — R519 Headache, unspecified: Secondary | ICD-10-CM | POA: Diagnosis not present

## 2019-03-04 DIAGNOSIS — I480 Paroxysmal atrial fibrillation: Secondary | ICD-10-CM | POA: Diagnosis not present

## 2019-03-04 DIAGNOSIS — Z79899 Other long term (current) drug therapy: Secondary | ICD-10-CM | POA: Insufficient documentation

## 2019-03-04 DIAGNOSIS — S0990XA Unspecified injury of head, initial encounter: Secondary | ICD-10-CM | POA: Diagnosis not present

## 2019-03-04 DIAGNOSIS — I5181 Takotsubo syndrome: Secondary | ICD-10-CM | POA: Diagnosis not present

## 2019-03-04 DIAGNOSIS — Z87891 Personal history of nicotine dependence: Secondary | ICD-10-CM | POA: Insufficient documentation

## 2019-03-04 DIAGNOSIS — I10 Essential (primary) hypertension: Secondary | ICD-10-CM | POA: Insufficient documentation

## 2019-03-04 DIAGNOSIS — R4020 Unspecified coma: Secondary | ICD-10-CM | POA: Diagnosis not present

## 2019-03-04 DIAGNOSIS — F39 Unspecified mood [affective] disorder: Secondary | ICD-10-CM | POA: Insufficient documentation

## 2019-03-04 DIAGNOSIS — E785 Hyperlipidemia, unspecified: Secondary | ICD-10-CM | POA: Insufficient documentation

## 2019-03-04 DIAGNOSIS — Y92009 Unspecified place in unspecified non-institutional (private) residence as the place of occurrence of the external cause: Secondary | ICD-10-CM

## 2019-03-04 DIAGNOSIS — R42 Dizziness and giddiness: Secondary | ICD-10-CM | POA: Diagnosis not present

## 2019-03-04 DIAGNOSIS — R55 Syncope and collapse: Principal | ICD-10-CM

## 2019-03-04 DIAGNOSIS — S299XXA Unspecified injury of thorax, initial encounter: Secondary | ICD-10-CM | POA: Diagnosis not present

## 2019-03-04 LAB — I-STAT CHEM 8, ED
BUN: 12 mg/dL (ref 8–23)
Calcium, Ion: 1.12 mmol/L — ABNORMAL LOW (ref 1.15–1.40)
Chloride: 96 mmol/L — ABNORMAL LOW (ref 98–111)
Creatinine, Ser: 0.5 mg/dL (ref 0.44–1.00)
Glucose, Bld: 94 mg/dL (ref 70–99)
HCT: 41 % (ref 36.0–46.0)
Hemoglobin: 13.9 g/dL (ref 12.0–15.0)
Potassium: 3.7 mmol/L (ref 3.5–5.1)
Sodium: 134 mmol/L — ABNORMAL LOW (ref 135–145)
TCO2: 30 mmol/L (ref 22–32)

## 2019-03-04 LAB — URINALYSIS, ROUTINE W REFLEX MICROSCOPIC
Bilirubin Urine: NEGATIVE
Glucose, UA: NEGATIVE mg/dL
Hgb urine dipstick: NEGATIVE
Ketones, ur: NEGATIVE mg/dL
Leukocytes,Ua: NEGATIVE
Nitrite: NEGATIVE
Protein, ur: 30 mg/dL — AB
Specific Gravity, Urine: 1.013 (ref 1.005–1.030)
pH: 7 (ref 5.0–8.0)

## 2019-03-04 LAB — BASIC METABOLIC PANEL
Anion gap: 11 (ref 5–15)
BUN: 10 mg/dL (ref 8–23)
CO2: 26 mmol/L (ref 22–32)
Calcium: 9.4 mg/dL (ref 8.9–10.3)
Chloride: 97 mmol/L — ABNORMAL LOW (ref 98–111)
Creatinine, Ser: 0.54 mg/dL (ref 0.44–1.00)
GFR calc Af Amer: >60 mL/min (ref >60)
GFR calc non Af Amer: >60 mL/min (ref >60)
Glucose, Bld: 99 mg/dL (ref 70–99)
Potassium: 3.7 mmol/L (ref 3.5–5.1)
Sodium: 134 mmol/L — ABNORMAL LOW (ref 135–145)

## 2019-03-04 LAB — CBC WITH DIFFERENTIAL/PLATELET
Abs Immature Granulocytes: 0.06 10*3/uL (ref 0.00–0.07)
Basophils Absolute: 0.1 10*3/uL (ref 0.0–0.1)
Basophils Relative: 1 %
Eosinophils Absolute: 0.1 10*3/uL (ref 0.0–0.5)
Eosinophils Relative: 1 %
HCT: 41.4 % (ref 36.0–46.0)
Hemoglobin: 13.7 g/dL (ref 12.0–15.0)
Immature Granulocytes: 1 %
Lymphocytes Relative: 21 %
Lymphs Abs: 1.7 10*3/uL (ref 0.7–4.0)
MCH: 32.7 pg (ref 26.0–34.0)
MCHC: 33.1 g/dL (ref 30.0–36.0)
MCV: 98.8 fL (ref 80.0–100.0)
Monocytes Absolute: 0.6 10*3/uL (ref 0.1–1.0)
Monocytes Relative: 7 %
Neutro Abs: 5.9 10*3/uL (ref 1.7–7.7)
Neutrophils Relative %: 69 %
Platelets: 258 10*3/uL (ref 150–400)
RBC: 4.19 MIL/uL (ref 3.87–5.11)
RDW: 12.1 % (ref 11.5–15.5)
WBC: 8.4 10*3/uL (ref 4.0–10.5)
nRBC: 0 % (ref 0.0–0.2)

## 2019-03-04 LAB — TROPONIN I (HIGH SENSITIVITY)
Troponin I (High Sensitivity): 13 ng/L (ref <18)
Troponin I (High Sensitivity): 10 ng/L (ref <18)

## 2019-03-04 LAB — SARS CORONAVIRUS 2 (TAT 6-24 HRS): SARS Coronavirus 2: NEGATIVE

## 2019-03-04 LAB — ECHOCARDIOGRAM COMPLETE
Height: 63 in
Weight: 2688 oz

## 2019-03-04 LAB — TSH: TSH: 3.1 u[IU]/mL (ref 0.350–4.500)

## 2019-03-04 MED ORDER — LATANOPROST 0.005 % OP SOLN
1.0000 [drp] | Freq: Every day | OPHTHALMIC | Status: DC
  Administered 2019-03-04: 1 [drp] via OPHTHALMIC
  Filled 2019-03-04: qty 2.5

## 2019-03-04 MED ORDER — HYDROMORPHONE HCL 1 MG/ML IJ SOLN
0.5000 mg | INTRAMUSCULAR | Status: DC | PRN
  Administered 2019-03-04 – 2019-03-05 (×4): 0.5 mg via INTRAVENOUS
  Filled 2019-03-04 (×6): qty 1

## 2019-03-04 MED ORDER — METOPROLOL SUCCINATE ER 25 MG PO TB24
25.0000 mg | ORAL_TABLET | Freq: Every day | ORAL | Status: DC
  Administered 2019-03-04 – 2019-03-05 (×2): 25 mg via ORAL
  Filled 2019-03-04 (×2): qty 1

## 2019-03-04 MED ORDER — ACETAMINOPHEN 650 MG RE SUPP: 650.0000 mg | Freq: Four times a day (QID) | RECTAL | Status: DC | PRN

## 2019-03-04 MED ORDER — ALBUTEROL SULFATE (2.5 MG/3ML) 0.083% IN NEBU: 2.5000 mg | INHALATION_SOLUTION | Freq: Four times a day (QID) | RESPIRATORY_TRACT | Status: DC | PRN

## 2019-03-04 MED ORDER — LORAZEPAM 1 MG PO TABS: 1.0000 mg | ORAL_TABLET | ORAL | Status: DC | PRN

## 2019-03-04 MED ORDER — LACTATED RINGERS IV SOLN
INTRAVENOUS | Status: DC
  Administered 2019-03-04: 11:00:00 via INTRAVENOUS

## 2019-03-04 MED ORDER — SODIUM CHLORIDE 0.9% FLUSH
3.0000 mL | Freq: Two times a day (BID) | INTRAVENOUS | Status: DC
  Administered 2019-03-04: 3 mL via INTRAVENOUS

## 2019-03-04 MED ORDER — ATORVASTATIN CALCIUM 10 MG PO TABS
20.0000 mg | ORAL_TABLET | Freq: Every day | ORAL | Status: DC
  Administered 2019-03-04: 20 mg via ORAL
  Filled 2019-03-04: qty 2

## 2019-03-04 MED ORDER — ADULT MULTIVITAMIN W/MINERALS CH
1.0000 | ORAL_TABLET | Freq: Every day | ORAL | Status: DC
  Administered 2019-03-04 – 2019-03-05 (×2): 1 via ORAL
  Filled 2019-03-04 (×2): qty 1

## 2019-03-04 MED ORDER — HYDROCHLOROTHIAZIDE 12.5 MG PO CAPS
12.5000 mg | ORAL_CAPSULE | Freq: Every day | ORAL | Status: DC
  Administered 2019-03-04: 11:00:00 12.5 mg via ORAL
  Filled 2019-03-04: qty 1

## 2019-03-04 MED ORDER — MORPHINE SULFATE (PF) 2 MG/ML IV SOLN
2.0000 mg | INTRAVENOUS | Status: DC | PRN
  Administered 2019-03-04: 14:00:00 2 mg via INTRAVENOUS
  Filled 2019-03-04 (×2): qty 1

## 2019-03-04 MED ORDER — LOSARTAN POTASSIUM 50 MG PO TABS
50.0000 mg | ORAL_TABLET | Freq: Every day | ORAL | Status: DC
  Administered 2019-03-04 – 2019-03-05 (×2): 50 mg via ORAL
  Filled 2019-03-04 (×2): qty 1

## 2019-03-04 MED ORDER — ACETAMINOPHEN 325 MG PO TABS: 650.0000 mg | ORAL_TABLET | Freq: Four times a day (QID) | ORAL | Status: DC | PRN

## 2019-03-04 MED ORDER — THIAMINE HCL 100 MG/ML IJ SOLN
100.0000 mg | Freq: Every day | INTRAMUSCULAR | Status: DC
  Filled 2019-03-04: qty 2

## 2019-03-04 MED ORDER — VITAMIN B-1 100 MG PO TABS
100.0000 mg | ORAL_TABLET | Freq: Every day | ORAL | Status: DC
  Administered 2019-03-04 – 2019-03-05 (×2): 100 mg via ORAL
  Filled 2019-03-04 (×3): qty 1

## 2019-03-04 MED ORDER — ACETAMINOPHEN 500 MG PO TABS
1000.0000 mg | ORAL_TABLET | Freq: Once | ORAL | Status: AC
  Administered 2019-03-04: 1000 mg via ORAL
  Filled 2019-03-04: qty 2

## 2019-03-04 MED ORDER — ONDANSETRON HCL 4 MG PO TABS: 4.0000 mg | ORAL_TABLET | Freq: Four times a day (QID) | ORAL | Status: DC | PRN

## 2019-03-04 MED ORDER — OXYCODONE HCL 5 MG PO TABS
5.0000 mg | ORAL_TABLET | ORAL | Status: DC | PRN
  Administered 2019-03-04 – 2019-03-05 (×2): 5 mg via ORAL
  Filled 2019-03-04 (×2): qty 1

## 2019-03-04 MED ORDER — APIXABAN 5 MG PO TABS
5.0000 mg | ORAL_TABLET | Freq: Two times a day (BID) | ORAL | Status: DC
  Administered 2019-03-04 – 2019-03-05 (×3): 5 mg via ORAL
  Filled 2019-03-04 (×4): qty 1

## 2019-03-04 MED ORDER — LORATADINE 10 MG PO TABS
10.0000 mg | ORAL_TABLET | Freq: Every day | ORAL | Status: DC
  Administered 2019-03-04 – 2019-03-05 (×2): 10 mg via ORAL
  Filled 2019-03-04 (×2): qty 1

## 2019-03-04 MED ORDER — LORAZEPAM 2 MG/ML IJ SOLN: 1.0000 mg | INTRAMUSCULAR | Status: DC | PRN

## 2019-03-04 MED ORDER — FOLIC ACID 1 MG PO TABS
1.0000 mg | ORAL_TABLET | Freq: Every day | ORAL | Status: DC
  Administered 2019-03-04 – 2019-03-05 (×2): 1 mg via ORAL
  Filled 2019-03-04 (×2): qty 1

## 2019-03-04 MED ORDER — ONDANSETRON HCL 4 MG/2ML IJ SOLN: 4.0000 mg | Freq: Four times a day (QID) | INTRAMUSCULAR | Status: DC | PRN

## 2019-03-04 NOTE — ED Notes (Signed)
Lunch Tray Ordered @ 1020.  

## 2019-03-04 NOTE — Progress Notes (Signed)
EEG Completed; Results Pending  

## 2019-03-04 NOTE — ED Notes (Signed)
Patient transported to CT 

## 2019-03-04 NOTE — ED Notes (Signed)
ED TO INPATIENT HANDOFF REPORT  ED Nurse Name and Phone #: [phone] Lauree Chandler., RN  S Name/Age/Gender Melinda [name] 84 y.o. female Room/Bed: [phone]  Code Status   Code Status: Full Code  Home/SNF/Other Home Patient oriented to: self, place, time and situation Is this baseline? Yes   Triage Complete: Triage complete  Chief Complaint fall  Triage Note Pt here via EMS, pt had fall tonight at midnight-dizzy before fall but did not lose consciousness. Right hip and R wrist pain from fall. 0430 this morning, pt had syncopal episode when going to the bathroom-husband caught her so no injury from fall.  No orthostatic changes. Pt on Eliquis.    Allergies Allergies  Allergen Reactions  . Ibuprofen Shortness Of Breath and Other (See Comments)    Breathing issues  . Nsaids Hives, Shortness Of Breath and Other (See Comments)    Extreme hives and difficulty breathing. "Has had to go to the ER."  . Salicylates Other (See Comments)    Extreme hives and difficulty breathing. "Has had to go to the ER."  . Codeine Nausea Only  . Etodolac Rash  . Hydrochlorothiazide Other (See Comments)    Hyponatremia    Level of Care/Admitting Diagnosis ED Disposition    ED Disposition Condition Comment   Admit  Hospital Area: [facility] [[id number]]  Level of Care: Telemetry Medical [104]  I expect the patient will be discharged within 24 hours: Yes  LOW acuity---Tx typically complete <24 hrs---ACUTE conditions typically can be evaluated <24 hours---LABS likely to return to acceptable levels <24 hours---IS near functional baseline---EXPECTED to return to current living arrangement---NOT newly hypoxic: Meets criteria for 5C-Observation unit  Covid Evaluation: Asymptomatic Screening Protocol (No Symptoms)  Diagnosis: Syncope [206001]  Admitting Physician: Karmen Bongo [[id number]]  Attending Physician: Karmen Bongo [[id number]]  PT Class (Do Not Modify): Observation [104]  PT Acc  Code (Do Not Modify): Observation [10022]       B Medical/Surgery History Past Medical History:  Diagnosis Date  . Allergy   . Anemia 1984   before hysterectomy  . Breast cancer ([place])    bilateral  . GERD (gastroesophageal reflux disease)   . History of blood product transfusion 1984  . Hyperlipidemia   . Hypertension   . Macular degeneration    right eye  . Myocardial infarction ([place])   . Osteopenia   . Personal history of radiation therapy   . Takotsubo cardiomyopathy    Past Surgical History:  Procedure Laterality Date  . ABDOMINAL HYSTERECTOMY  1984  . APPENDECTOMY    . BREAST BIOPSY Right 04/29/05   right   . BREAST BIOPSY Left 2006  . BREAST LUMPECTOMY Left 04/19/04   left with sentinel node  . BREAST LUMPECTOMY Right 2007  . CATARACT EXTRACTION     bilateral  . EYE SURGERY    . LEFT HEART CATHETERIZATION WITH CORONARY ANGIOGRAM N/A 07/26/2013   Procedure: LEFT HEART CATHETERIZATION WITH CORONARY ANGIOGRAM;  Surgeon: Jettie Booze, MD;  Location: [facility] CATH LAB;  Service: Cardiovascular;  Laterality: N/A;  . RETINAL LASER PROCEDURE     right     A IV Location/Drains/Wounds Patient Lines/Drains/Airways Status   Active Line/Drains/Airways    Name:   Placement date:   Placement time:   Site:   Days:   Peripheral IV 03/04/19 Right Antecubital   03/04/19    0551    Antecubital   less than 1  Intake/Output Last 24 hours No intake or output data in the 24 hours ending 03/04/19 2122  Labs/Imaging Results for orders placed or performed during the hospital encounter of 03/04/19 (from the past 48 hour(s))  CBC with Differential     Status: None   Collection Time: 03/04/19  5:53 AM  Result Value Ref Range   WBC 8.4 4.0 - 10.5 K/uL   RBC 4.19 3.87 - 5.11 MIL/uL   Hemoglobin 13.7 12.0 - 15.0 g/dL   HCT 41.4 36.0 - 46.0 %   MCV 98.8 80.0 - 100.0 fL   MCH 32.7 26.0 - 34.0 pg   MCHC 33.1 30.0 - 36.0 g/dL   RDW 12.1 11.5 - 15.5 %   Platelets 258 150  - 400 K/uL   nRBC 0.0 0.0 - 0.2 %   Neutrophils Relative % 69 %   Neutro Abs 5.9 1.7 - 7.7 K/uL   Lymphocytes Relative 21 %   Lymphs Abs 1.7 0.7 - 4.0 K/uL   Monocytes Relative 7 %   Monocytes Absolute 0.6 0.1 - 1.0 K/uL   Eosinophils Relative 1 %   Eosinophils Absolute 0.1 0.0 - 0.5 K/uL   Basophils Relative 1 %   Basophils Absolute 0.1 0.0 - 0.1 K/uL   Immature Granulocytes 1 %   Abs Immature Granulocytes 0.06 0.00 - 0.07 K/uL    Comment: Performed at [facility], [address], [place], [place] [address]  Troponin I (High Sensitivity)     Status: None   Collection Time: 03/04/19  5:53 AM  Result Value Ref Range   Troponin I (High Sensitivity) 13 <18 ng/L    Comment: (NOTE) Elevated high sensitivity troponin I (hsTnI) values and significant  changes across serial measurements may suggest ACS but many other  chronic and acute conditions are known to elevate hsTnI results.  Refer to the "Links" section for chest pain algorithms and additional  guidance. Performed at [facility], [place] [address] [place], [place] [id number]   Basic metabolic panel     Status: Abnormal   Collection Time: 03/04/19  5:53 AM  Result Value Ref Range   Sodium 134 (L) 135 - 145 mmol/L   Potassium 3.7 3.5 - 5.1 mmol/L   Chloride 97 (L) 98 - 111 mmol/L   CO2 26 22 - 32 mmol/L   Glucose, Bld 99 70 - 99 mg/dL   BUN 10 8 - 23 mg/dL   Creatinine, Ser 0.54 0.44 - 1.00 mg/dL   Calcium 9.4 8.9 - 10.3 mg/dL   GFR calc non Af Amer >60 >60 mL/min   GFR calc Af Amer >60 >60 mL/min   Anion gap 11 5 - 15    Comment: Performed at [facility], [place] [address] [place], [place] [address]  Urinalysis, Routine w reflex microscopic     Status: Abnormal   Collection Time: 03/04/19  6:11 AM  Result Value Ref Range   Color, Urine YELLOW YELLOW   APPearance CLOUDY (A) CLEAR   Specific Gravity, Urine 1.013 1.005 - 1.030   pH 7.0 5.0 - 8.0   Glucose, UA NEGATIVE NEGATIVE mg/dL   Hgb urine dipstick  NEGATIVE NEGATIVE   Bilirubin Urine NEGATIVE NEGATIVE   Ketones, ur NEGATIVE NEGATIVE mg/dL   Protein, ur 30 (A) NEGATIVE mg/dL   Nitrite NEGATIVE NEGATIVE   Leukocytes,Ua NEGATIVE NEGATIVE   RBC / HPF 0-5 0 - 5 RBC/hpf   WBC, UA 0-5 0 - 5 WBC/hpf  Bacteria, UA RARE (A) NONE SEEN   Squamous Epithelial / LPF 0-5 0 - 5   Mucus PRESENT    Budding Yeast PRESENT    Hyaline Casts, UA PRESENT    Amorphous Crystal PRESENT     Comment: Performed at [facility], [place] [address], [place], [place] [address]  SARS CORONAVIRUS 2 (TAT 6-24 HRS) Nasopharyngeal Nasopharyngeal Swab     Status: None   Collection Time: 03/04/19  6:57 AM   Specimen: Nasopharyngeal Swab  Result Value Ref Range   SARS Coronavirus 2 NEGATIVE NEGATIVE    Comment: (NOTE) SARS-CoV-2 target nucleic acids are NOT DETECTED. The SARS-CoV-2 RNA is generally detectable in upper and lower respiratory specimens during the acute phase of infection. Negative results do not preclude SARS-CoV-2 infection, do not rule out co-infections with other pathogens, and should not be used as the sole basis for treatment or other patient management decisions. Negative results must be combined with clinical observations, patient history, and epidemiological information. The expected result is Negative. Fact Sheet for Patients: SugarRoll.be Fact Sheet for Healthcare Providers: https://www.woods-mathews.com/ This test is not yet approved or cleared by the [place] FDA and  has been authorized for detection and/or diagnosis of SARS-CoV-2 by FDA under an Emergency Use Authorization (EUA). This EUA will remain  in effect (meaning this test can be used) for the duration of the COVID-19 declaration under Section 56 4(b)(1) of the Act, 21 U.S.C. section 360bbb-3(b)(1), unless the authorization is terminated or revoked sooner. Performed at [facility], [place] [address], [place],  [place] [address]   I-stat chem 8, ED (not at [facility] or [facility])     Status: Abnormal   Collection Time: 03/04/19  7:17 AM  Result Value Ref Range   Sodium 134 (L) 135 - 145 mmol/L   Potassium 3.7 3.5 - 5.1 mmol/L   Chloride 96 (L) 98 - 111 mmol/L   BUN 12 8 - 23 mg/dL   Creatinine, Ser 0.50 0.44 - 1.00 mg/dL   Glucose, Bld 94 70 - 99 mg/dL   Calcium, Ion 1.12 (L) 1.15 - 1.40 mmol/L   TCO2 30 22 - 32 mmol/L   Hemoglobin 13.9 12.0 - 15.0 g/dL   HCT 41.0 36.0 - 46.0 %  Troponin I (High Sensitivity)     Status: None   Collection Time: 03/04/19  8:11 AM  Result Value Ref Range   Troponin I (High Sensitivity) 10 <18 ng/L    Comment: (NOTE) Elevated high sensitivity troponin I (hsTnI) values and significant  changes across serial measurements may suggest ACS but many other  chronic and acute conditions are known to elevate hsTnI results.  Refer to the "Links" section for chest pain algorithms and additional  guidance. Performed at [facility], [place] [address], [place], [place] [address]   TSH     Status: None   Collection Time: 03/04/19  9:35 AM  Result Value Ref Range   TSH 3.100 0.350 - 4.500 uIU/mL    Comment: Performed by a 3rd Generation assay with a functional sensitivity of <=0.01 uIU/mL. Performed at [facility], [place] [address] [place], [place] [address]    Dg Chest 1 View  Result Date: 03/04/2019 CLINICAL DATA:  [address] [address] on right side. EXAM: CHEST  1 VIEW COMPARISON:  Chest x-ray 04/04/2014 FINDINGS: The cardiac silhouette, mediastinal and hilar contours are within normal limits. There is mild tortuosity and calcification of the thoracic aorta. Chronic bronchitic type lung changes  are noted with biapical scarring. No acute pulmonary findings. No pneumothorax or pleural effusion. The bony thorax is intact.  No definite rib fractures. IMPRESSION: No acute cardiopulmonary findings and intact bony thorax Electronically Signed   By: Marijo Sanes M.D.   On: 03/04/2019 07:21   Dg  Wrist Complete Right  Result Date: 03/04/2019 CLINICAL DATA:  [address].  Injured wrist. EXAM: RIGHT WRIST - COMPLETE 3+ VIEW COMPARISON:  None. FINDINGS: The radius and ulna are intact. The intercarpal joint spaces are maintained. Mild degenerative changes. The lateral film demonstrates a triquetral avulsion fracture. Advanced degenerative changes at the [facility] joint of the thumb and mild to moderate degenerative changes at the scaphoid articulation with the trapezium and trapezoid bones. IMPRESSION: 1. Small triquetral avulsion fracture. 2. Severe degenerative changes at the [facility] joint of the thumb. Electronically Signed   By: Marijo Sanes M.D.   On: 03/04/2019 07:24   Ct Head Wo Contrast  Result Date: 03/04/2019 CLINICAL DATA:  Syncopal episode. Fall. Trauma to right hip. Patient denies headache or neck pain. Initial encounter. EXAM: CT HEAD WITHOUT CONTRAST CT CERVICAL SPINE WITHOUT CONTRAST TECHNIQUE: Multidetector CT imaging of the head and cervical spine was performed following the standard protocol without intravenous contrast. Multiplanar CT image reconstructions of the cervical spine were also generated. COMPARISON:  MR head without contrast 01/01/2018. CT head without contrast 11/29/2013. FINDINGS: CT HEAD FINDINGS Brain: Mild atrophy and white matter changes are within normal limits for age. No acute infarct, hemorrhage, or mass lesion is present. The ventricles are of normal size. No significant extraaxial fluid collection is present. The brainstem and cerebellum are within normal limits. Vascular: Atherosclerotic calcifications are present within the cavernous internal carotid arteries and at the dural margin of the vertebral arteries bilaterally. No asymmetric hyperdense vessel is present. 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 No acute abnormalities are present. No significant extracranial soft tissue lesion is present. Sinuses/Orbits: The paranasal sinuses and mastoid air cells are  clear. Bilateral lens replacements are noted. Globes and orbits are otherwise unremarkable. CT CERVICAL SPINE FINDINGS Alignment: There is straightening of the normal cervical lordosis. Skull base and vertebrae: Craniocervical junction is within normal limits. Vertebral body heights are maintained. No acute or healing fractures are present. Soft tissues and spinal canal: No prevertebral fluid or swelling. No visible canal hematoma. Atherosclerotic calcifications are present at the carotid bifurcations bilaterally. No focal soft tissue lesions are present in the neck. If Disc levels: Multilevel degenerative changes are present. Facet and uncovertebral spurring contribute to moderate to severe left foraminal stenosis at C3-4 and C4-5. Moderate foraminal narrowing bilaterally at C5-6 is worse on the left. Mild foraminal narrowing at C6-7 is worse on the left. Upper chest: The lung apices are clear. Thoracic inlet is normal. Atherosclerotic changes are noted at the aortic arch and great vessel origins. IMPRESSION: 1. No acute trauma to the head or cervical spine. 2. Normal CT appearance of the brain for age. 3. Multilevel degenerative changes of the cervical spine as described. 4.  Aortic Atherosclerosis (ICD10-I70.0). Electronically Signed   By: San Morelle M.D.   On: 03/04/2019 06:52   Ct Cervical Spine Wo Contrast  Result Date: 03/04/2019 CLINICAL DATA:  Syncopal episode. Fall. Trauma to right hip. Patient denies headache or neck pain. Initial encounter. EXAM: CT HEAD WITHOUT CONTRAST CT CERVICAL SPINE WITHOUT CONTRAST TECHNIQUE: Multidetector CT imaging of the head and cervical spine was performed following the standard protocol without intravenous contrast. Multiplanar CT image reconstructions of  the cervical spine were also generated. COMPARISON:  MR head without contrast 01/01/2018. CT head without contrast 11/29/2013. FINDINGS: CT HEAD FINDINGS Brain: Mild atrophy and white matter changes are within  normal limits for age. No acute infarct, hemorrhage, or mass lesion is present. The ventricles are of normal size. No significant extraaxial fluid collection is present. The brainstem and cerebellum are within normal limits. Vascular: Atherosclerotic calcifications are present within the cavernous internal carotid arteries and at the dural margin of the vertebral arteries bilaterally. No asymmetric hyperdense vessel is present. 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 No acute abnormalities are present. No significant extracranial soft tissue lesion is present. Sinuses/Orbits: The paranasal sinuses and mastoid air cells are clear. Bilateral lens replacements are noted. Globes and orbits are otherwise unremarkable. CT CERVICAL SPINE FINDINGS Alignment: There is straightening of the normal cervical lordosis. Skull base and vertebrae: Craniocervical junction is within normal limits. Vertebral body heights are maintained. No acute or healing fractures are present. Soft tissues and spinal canal: No prevertebral fluid or swelling. No visible canal hematoma. Atherosclerotic calcifications are present at the carotid bifurcations bilaterally. No focal soft tissue lesions are present in the neck. If Disc levels: Multilevel degenerative changes are present. Facet and uncovertebral spurring contribute to moderate to severe left foraminal stenosis at C3-4 and C4-5. Moderate foraminal narrowing bilaterally at C5-6 is worse on the left. Mild foraminal narrowing at C6-7 is worse on the left. Upper chest: The lung apices are clear. Thoracic inlet is normal. Atherosclerotic changes are noted at the aortic arch and great vessel origins. IMPRESSION: 1. No acute trauma to the head or cervical spine. 2. Normal CT appearance of the brain for age. 3. Multilevel degenerative changes of the cervical spine as described. 4.  Aortic Atherosclerosis (ICD10-I70.0). Electronically Signed   By: San Morelle M.D.   On:  03/04/2019 06:52   Dg Hip Unilat With Pelvis 2-3 Views Right  Result Date: 03/04/2019 CLINICAL DATA:  [address].  Right hip pain. EXAM: DG HIP (WITH OR WITHOUT PELVIS) 2-3V RIGHT COMPARISON:  None. FINDINGS: Both hips are normally located. Mild/moderate degenerative changes. No acute fracture. No plain film findings for avascular necrosis. The pubic symphysis and SI joints are intact. IMPRESSION: No acute bony findings. Electronically Signed   By: Marijo Sanes M.D.   On: 03/04/2019 07:26    Pending Labs Unresulted Labs (From admission, onward)    Start     Ordered   03/05/19 [id number]  Basic metabolic panel  Tomorrow morning,   R     03/04/19 0904   03/05/19 0500  CBC  Tomorrow morning,   R     03/04/19 0904          Vitals/Pain Today's Vitals   03/04/19 1915 03/04/19 1930 03/04/19 1952 03/04/19 1952  BP: (!) 132/59 (!) 126/56    Pulse: 81 77    Resp: (!) 28 (!) 28    Temp:    98.4 F (36.9 C)  TempSrc:    Oral  SpO2: 95% 95%    Weight:      Height:      PainSc:  6  6      Isolation Precautions No active isolations  Medications Medications  atorvastatin (LIPITOR) tablet 20 mg (has no administration in time range)  hydrochlorothiazide (MICROZIDE) capsule 12.5 mg (12.5 mg Oral Given 03/04/19 1112)  losartan (COZAAR) tablet 50 mg (50 mg Oral Given 03/04/19 1111)  metoprolol succinate (TOPROL-XL) 24 hr tablet 25 mg (25 mg  Oral Given 03/04/19 1112)  apixaban (ELIQUIS) tablet 5 mg (5 mg Oral Given 03/04/19 1132)  albuterol (PROVENTIL) (2.5 MG/3ML) 0.083% nebulizer solution 2.5 mg (has no administration in time range)  loratadine (CLARITIN) tablet 10 mg (10 mg Oral Given 03/04/19 1111)  latanoprost (XALATAN) 0.005 % ophthalmic solution 1 drop (has no administration in time range)  LORazepam (ATIVAN) tablet 1-4 mg (has no administration in time range)    Or  LORazepam (ATIVAN) injection 1-4 mg (has no administration in time range)  thiamine (VITAMIN B-1) tablet 100 mg (100 mg Oral Given  03/04/19 1111)    Or  thiamine (B-1) injection 100 mg ( Intravenous See Alternative [id number])  folic acid (FOLVITE) tablet 1 mg (1 mg Oral Given 03/04/19 1112)  multivitamin with minerals tablet 1 tablet (1 tablet Oral Given 03/04/19 1112)  sodium chloride flush (NS) 0.9 % injection 3 mL (3 mLs Intravenous Given 03/04/19 1112)  lactated ringers infusion ( Intravenous New Bag/Given 03/04/19 1046)  acetaminophen (TYLENOL) tablet 650 mg (has no administration in time range)    Or  acetaminophen (TYLENOL) suppository 650 mg (has no administration in time range)  ondansetron (ZOFRAN) tablet 4 mg (has no administration in time range)    Or  ondansetron (ZOFRAN) injection 4 mg (has no administration in time range)  oxyCODONE (Oxy IR/ROXICODONE) immediate release tablet 5 mg (has no administration in time range)  HYDROmorphone (DILAUDID) injection 0.5 mg (0.5 mg Intravenous Given 03/04/19 1855)  acetaminophen (TYLENOL) tablet 1,000 mg (1,000 mg Oral Given 03/04/19 0705)    Mobility walks with device High fall risk   Focused Assessments Neuro Assessment Handoff:  Swallow screen pass? N/A   NIH Stroke Scale ( + Modified Stroke Scale Criteria)  LOC Questions (1b. )   +: Answers both questions correctly LOC Commands (1c. )   + : Performs both tasks correctly Best Gaze (2. )  +: Normal Visual (3. )  +: No visual loss Motor Arm, Left (5a. )   +: No drift Motor Arm, Right (5b. )   +: No drift Motor Leg, Left (6a. )   +: No drift Motor Leg, Right (6b. )   +: No drift Sensory (8. )   +: Normal, no sensory loss Best Language (9. )   +: No aphasia Extinction/Inattention (11.)   +: No Abnormality Modified SS Total  +: 0     Neuro Assessment: Within Defined Limits Neuro Checks:      Last Documented NIHSS Modified Score: 0 (03/04/19 1930) Has TPA been given? No If patient is a Neuro Trauma and patient is going to OR before floor call report to [place] nurse: [phone] or  [phone]     R Recommendations: See Admitting Provider Note  Report given to:   Additional Notes:

## 2019-03-04 NOTE — Progress Notes (Signed)
CSW received consult for patient for current substance use. CSW completed chart review, and per notes in 2015, patient reported having 2-3 shots of whiskey per week at that time. Patient presented to ED for recent syncope episodes and there is no UDS to reveal any current alcohol use. CSW will screen out consult, if needs arise please place additional consult.  Madilyn Fireman, MSW, LCSW-A Transitions of Care  Clinical Social Worker  [facility] Emergency Departments  Medical ICU [phone]

## 2019-03-04 NOTE — H&P (Addendum)
History and Physical    Melinda [name] TIW:[id number] DOB: 1935/03/12 DOA: 03/04/2019  PCP: [name], MD Consultants:  Croitoru - cardiology; Rip Harbour - orthopedics Patient coming from: [place]; NOK: Husband, [phone]  Chief Complaint: Syncope  HPI: Melinda [name] is a 84 y.o. female with medical history significant of Takotsubo cardiomyopathy; CAD; HTN; HLD; afib on Eliquis; and B breast cancer presenting with syncope.   She went in the bathroom after watching tv about 0030; when she got up to come back out she fell to the ground and landed on her hip and caught her hand while falling.  She did not lose consciousness this time.  She called for help and the [facility] came to assist.  She went back to bed and slept until about 430.  She used her cane to go back to the bathroom.  She passed out while on the commode.  She did not fall to the ground.  She was unconscious for several minutes and she was not very lucid when she came to.  She doesn't feel great and her hand hurts and she is tired and very thirsty.  She blacked out prior in Oct 2019 and she was dehydrated on a very hot day while doing tai chi.  She has not been feeling poorly leading up to this.  She did feel like her heart was racing after the first fall but that did not recur.  She was last admitted for syncope in Oct 2019.  At that time, syncope was attributed to beta blocker and dehydration.  There was consideration of event monitor for possible tachy-brady syndrome.     ED Course:  Syncope obs - 2 unprovoked events last night at ALF.  One of them with a triquetral fracture.  Second time was ?micturitional.  Review of Systems: As per HPI; otherwise review of systems reviewed and negative.   Ambulatory Status:  Ambulates without assistance or with a cane  Past Medical History:  Diagnosis Date   Allergy    Anemia 1984   before hysterectomy   Breast cancer ([place])    bilateral   Cancer  ([place])    bilateral breast   GERD (gastroesophageal reflux disease)    History of blood product transfusion 1984   Hyperlipidemia    Hypertension    Macular degeneration    right eye   Myocardial infarction ([place])    Osteopenia    Personal history of radiation therapy    Takotsubo cardiomyopathy     Past Surgical History:  Procedure Laterality Date   ABDOMINAL HYSTERECTOMY  1984   APPENDECTOMY     BREAST BIOPSY Right 04/29/05   right    BREAST BIOPSY Left 2006   BREAST LUMPECTOMY Left 04/19/04   left with sentinel node   BREAST LUMPECTOMY Right 2007   CATARACT EXTRACTION     bilateral   EYE SURGERY     LEFT HEART CATHETERIZATION WITH CORONARY ANGIOGRAM N/A 07/26/2013   Procedure: LEFT HEART CATHETERIZATION WITH CORONARY ANGIOGRAM;  Surgeon: Jettie Booze, MD;  Location: [facility] CATH LAB;  Service: Cardiovascular;  Laterality: N/A;   RETINAL LASER PROCEDURE     right    Social History   Socioeconomic History   Marital status: Married    Spouse name: Not on file   Number of children: Not on file   Years of education: Not on file   Highest education level: Not on file  Occupational History   Not on file  Social  Needs   Financial resource strain: Not on file   Food insecurity    Worry: Not on file    Inability: Not on file   Transportation needs    Medical: Not on file    Non-medical: Not on file  Tobacco Use   Smoking status: Former Smoker   Smokeless tobacco: Never Used  Substance and Sexual Activity   Alcohol use: Yes    Alcohol/week: 3.0 standard drinks    Types: 3 Shots of liquor per week    Comment: 2-3 drinks containing a shot each of whiskey per night   Drug use: No   Sexual activity: Not on file  Lifestyle   Physical activity    Days per week: Not on file    Minutes per session: Not on file   Stress: Not on file  Relationships   Social connections    Talks on phone: Not on file    Gets together: Not on file     Attends religious service: Not on file    Active member of club or organization: Not on file    Attends meetings of clubs or organizations: Not on file    Relationship status: Not on file   Intimate partner violence    Fear of current or ex partner: Not on file    Emotionally abused: Not on file    Physically abused: Not on file    Forced sexual activity: Not on file  Other Topics Concern   Not on file  Social History Narrative   Not on file    Allergies  Allergen Reactions   Ibuprofen Shortness Of Breath and Other (See Comments)    Breathing issues   Nsaids Hives, Shortness Of Breath and Other (See Comments)    Extreme hives and difficulty breathing. "Has had to go to the ER."   Salicylates Other (See Comments)    Extreme hives and difficulty breathing. "Has had to go to the ER."   Codeine Nausea Only   Etodolac Rash   Hydrochlorothiazide Other (See Comments)    Hyponatremia    Family History  Problem Relation Age of Onset   Dementia Mother    Stroke Mother    Heart disease Mother 11   Cancer Father        Prostate   Heart disease Father 82   Cancer Maternal Aunt        Breast   Breast cancer Maternal Aunt    Cancer Maternal Grandmother    Breast cancer Maternal Grandmother    Heart attack Maternal Grandfather    Heart attack Paternal Grandfather     Prior to Admission medications   Medication Sig Start Date End Date Taking? Authorizing Provider  acetaminophen (TYLENOL) 500 MG tablet Take 1,000 mg by mouth every 8 (eight) hours as needed (for pain, headaches, or fever).     [provider]  albuterol (PROAIR HFA) 108 (90 Base) MCG/ACT inhaler Inhale 1-2 puffs into the lungs every 6 (six) hours as needed for wheezing or shortness of breath.    [provider]  atorvastatin (LIPITOR) 20 MG tablet Take 20 mg by mouth at bedtime.     [provider]  cholecalciferol (VITAMIN D) 1000 units tablet Take 1,000 Units by mouth  daily.    [provider]  ELIQUIS 5 MG TABS tablet TAKE ONE TABLET BY MOUTH TWICE A DAY 12/22/18   Croitoru, Mihai, MD  fexofenadine (ALLEGRA) 180 MG tablet Take 180 mg by mouth daily.  [provider]  hydrochlorothiazide (MICROZIDE) 12.5 MG capsule TAKE ONE CAPSULE BY MOUTH DAILY 02/19/18   Croitoru, Mihai, MD  latanoprost (XALATAN) 0.005 % ophthalmic solution Place 1 drop into both eyes at bedtime.  06/17/13   [provider]  losartan (COZAAR) 50 MG tablet Take 50 mg by mouth daily. 11/10/16   [provider]  metoprolol succinate (TOPROL-XL) 25 MG 24 hr tablet Take 1 tablet (25 mg total) by mouth daily. 01/08/18   Duke, Tami Lin, PA  nitroGLYCERIN (NITROSTAT) 0.4 MG SL tablet Place 1 tablet (0.4 mg total) under the tongue every 5 (five) minutes as needed for chest pain. 12/22/16   Croitoru, Mihai, MD  sertraline (ZOLOFT) 25 MG tablet Take 25 mg by mouth daily as needed for anxiety. 10/05/17   [provider]    Physical Exam: Vitals:   03/04/19 0550 03/04/19 0600 03/04/19 0606 03/04/19 0700  BP:  (!) 156/59  (!) 159/58  Pulse:  68  67  Resp:  20  (!) 24  Temp:   98.2 F (36.8 C)   TempSrc:   Oral   SpO2:  99%  98%  Weight: 76.2 kg     Height: '5\' 3"'  (1.6 m)         General:  Appears calm and comfortable and is NAD, mildly somnolent  Eyes:  PERRL, EOMI, normal lids, iris  ENT:  grossly normal hearing, lips & tongue, mmm  Neck:  no LAD, masses or thyromegaly  Cardiovascular:  RRR, no m/r/g. No LE edema.   Respiratory:   CTA bilaterally with no wheezes/rales/rhonchi.  Normal respiratory effort.  Abdomen:  soft, NT, ND, NABS  Skin:  no rash or induration seen on limited exam  Musculoskeletal:  grossly normal tone BUE/BLE, good ROM, no bony abnormality  Psychiatric:  blunted mood and affect, speech fluent and appropriate, AOx3  Neurologic:  CN 2-12 grossly intact, moves all extremities in coordinated fashion, sensation  intact    Radiological Exams on Admission: Dg Chest 1 View  Result Date: 03/04/2019 CLINICAL DATA:  [place] on right side. EXAM: CHEST  1 VIEW COMPARISON:  Chest x-ray 04/04/2014 FINDINGS: The cardiac silhouette, mediastinal and hilar contours are within normal limits. There is mild tortuosity and calcification of the thoracic aorta. Chronic bronchitic type lung changes are noted with biapical scarring. No acute pulmonary findings. No pneumothorax or pleural effusion. The bony thorax is intact.  No definite rib fractures. IMPRESSION: No acute cardiopulmonary findings and intact bony thorax Electronically Signed   By: Marijo Sanes M.D.   On: 03/04/2019 07:21   Dg Wrist Complete Right  Result Date: 03/04/2019 CLINICAL DATA:  [place].  Injured wrist. EXAM: RIGHT WRIST - COMPLETE 3+ VIEW COMPARISON:  None. FINDINGS: The radius and ulna are intact. The intercarpal joint spaces are maintained. Mild degenerative changes. The lateral film demonstrates a triquetral avulsion fracture. Advanced degenerative changes at the [facility] joint of the thumb and mild to moderate degenerative changes at the scaphoid articulation with the trapezium and trapezoid bones. IMPRESSION: 1. Small triquetral avulsion fracture. 2. Severe degenerative changes at the [facility] joint of the thumb. Electronically Signed   By: Marijo Sanes M.D.   On: 03/04/2019 07:24   Ct Head Wo Contrast  Result Date: 03/04/2019 CLINICAL DATA:  Syncopal episode. Fall. Trauma to right hip. Patient denies headache or neck pain. Initial encounter. EXAM: CT HEAD WITHOUT CONTRAST CT CERVICAL SPINE WITHOUT CONTRAST TECHNIQUE: Multidetector CT imaging of the head and cervical spine was performed  following the standard protocol without intravenous contrast. Multiplanar CT image reconstructions of the cervical spine were also generated. COMPARISON:  MR head without contrast 01/01/2018. CT head without contrast 11/29/2013. FINDINGS: CT HEAD FINDINGS Brain: Mild atrophy and  white matter changes are within normal limits for age. No acute infarct, hemorrhage, or mass lesion is present. The ventricles are of normal size. No significant extraaxial fluid collection is present. The brainstem and cerebellum are within normal limits. Vascular: Atherosclerotic calcifications are present within the cavernous internal carotid arteries and at the dural margin of the vertebral arteries bilaterally. No asymmetric hyperdense vessel is present. 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 No acute abnormalities are present. No significant extracranial soft tissue lesion is present. Sinuses/Orbits: The paranasal sinuses and mastoid air cells are clear. Bilateral lens replacements are noted. Globes and orbits are otherwise unremarkable. CT CERVICAL SPINE FINDINGS Alignment: There is straightening of the normal cervical lordosis. Skull base and vertebrae: Craniocervical junction is within normal limits. Vertebral body heights are maintained. No acute or healing fractures are present. Soft tissues and spinal canal: No prevertebral fluid or swelling. No visible canal hematoma. Atherosclerotic calcifications are present at the carotid bifurcations bilaterally. No focal soft tissue lesions are present in the neck. If Disc levels: Multilevel degenerative changes are present. Facet and uncovertebral spurring contribute to moderate to severe left foraminal stenosis at C3-4 and C4-5. Moderate foraminal narrowing bilaterally at C5-6 is worse on the left. Mild foraminal narrowing at C6-7 is worse on the left. Upper chest: The lung apices are clear. Thoracic inlet is normal. Atherosclerotic changes are noted at the aortic arch and great vessel origins. IMPRESSION: 1. No acute trauma to the head or cervical spine. 2. Normal CT appearance of the brain for age. 3. Multilevel degenerative changes of the cervical spine as described. 4.  Aortic Atherosclerosis (ICD10-I70.0). Electronically Signed   By:  San Morelle M.D.   On: 03/04/2019 06:52   Ct Cervical Spine Wo Contrast  Result Date: 03/04/2019 CLINICAL DATA:  Syncopal episode. Fall. Trauma to right hip. Patient denies headache or neck pain. Initial encounter. EXAM: CT HEAD WITHOUT CONTRAST CT CERVICAL SPINE WITHOUT CONTRAST TECHNIQUE: Multidetector CT imaging of the head and cervical spine was performed following the standard protocol without intravenous contrast. Multiplanar CT image reconstructions of the cervical spine were also generated. COMPARISON:  MR head without contrast 01/01/2018. CT head without contrast 11/29/2013. FINDINGS: CT HEAD FINDINGS Brain: Mild atrophy and white matter changes are within normal limits for age. No acute infarct, hemorrhage, or mass lesion is present. The ventricles are of normal size. No significant extraaxial fluid collection is present. The brainstem and cerebellum are within normal limits. Vascular: Atherosclerotic calcifications are present within the cavernous internal carotid arteries and at the dural margin of the vertebral arteries bilaterally. No asymmetric hyperdense vessel is present. 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 No acute abnormalities are present. No significant extracranial soft tissue lesion is present. Sinuses/Orbits: The paranasal sinuses and mastoid air cells are clear. Bilateral lens replacements are noted. Globes and orbits are otherwise unremarkable. CT CERVICAL SPINE FINDINGS Alignment: There is straightening of the normal cervical lordosis. Skull base and vertebrae: Craniocervical junction is within normal limits. Vertebral body heights are maintained. No acute or healing fractures are present. Soft tissues and spinal canal: No prevertebral fluid or swelling. No visible canal hematoma. Atherosclerotic calcifications are present at the carotid bifurcations bilaterally. No focal soft tissue lesions are present in the neck. If Disc levels: Multilevel  degenerative  changes are present. Facet and uncovertebral spurring contribute to moderate to severe left foraminal stenosis at C3-4 and C4-5. Moderate foraminal narrowing bilaterally at C5-6 is worse on the left. Mild foraminal narrowing at C6-7 is worse on the left. Upper chest: The lung apices are clear. Thoracic inlet is normal. Atherosclerotic changes are noted at the aortic arch and great vessel origins. IMPRESSION: 1. No acute trauma to the head or cervical spine. 2. Normal CT appearance of the brain for age. 3. Multilevel degenerative changes of the cervical spine as described. 4.  Aortic Atherosclerosis (ICD10-I70.0). Electronically Signed   By: San Morelle M.D.   On: 03/04/2019 06:52   Dg Hip Unilat With Pelvis 2-3 Views Right  Result Date: 03/04/2019 CLINICAL DATA:  [place].  Right hip pain. EXAM: DG HIP (WITH OR WITHOUT PELVIS) 2-3V RIGHT COMPARISON:  None. FINDINGS: Both hips are normally located. Mild/moderate degenerative changes. No acute fracture. No plain film findings for avascular necrosis. The pubic symphysis and SI joints are intact. IMPRESSION: No acute bony findings. Electronically Signed   By: Marijo Sanes M.D.   On: 03/04/2019 07:26    EKG: Independently reviewed.  NSR with rate 67; no evidence of acute ischemia   Labs on Admission: I have personally reviewed the available labs and imaging studies at the time of the admission.  Pertinent labs:   Unremarkable BMP HS troponin 13 Normal CBC UA: 30 protein   Assessment/Plan Active Problems:   * No active hospital problems. *   Syncope -Etiology is not clear. The differential diagnosis is broad, including vasovagal syncope, seizure, TIA/stroke, arrhythmia, ACS, alcohol intoxication, drug abuse, orthostatic status, carotid artery stenosis -Will monitor on telemetry -Orthostatic vital signs in AM -2d echo -Neuro checks  -check A1c, FLP, TSH -Will check EEG given husband's concern for ?postictal period and also ETOH  dependence  ETOH abuse -Patient with chronic ETOH dependence - she acknowledges drinking too much, denies h/o withdrawal -CIWA protocol -SW consult   Fall with orthopedic injury(ies) -Has a R triquetral fracture -Orthopedics has consulted and will splint with plan for non-operative management at this time and outpatient hand surgery f/u -Additional femur imaging has been ordered and is pending; exam is unremarkable and she has been able to weight bear  HTN -Continue home meds: Toprol, HCTZ, and Cozaar  CAD/afib -Rate controlled with Toprol -Continue Eliquis -No current concern for ACS -This was previously a concern for tachy-brady syndrome so will monitor overnight on tele -Also with h/o Takotsubo CM, but last echo showed normal EF and grade 1 diastolic dysfunction; repeat echo is pending. -Continue Lipitor  Depression -Continue Zoloft     Note: This patient has been tested and is pending for the novel coronavirus COVID-19.  DVT prophylaxis: Eliquis Code Status:  Full - confirmed with patient/family Family Communication: Husband was present throughout evaluation  Disposition Plan:  Home once clinically improved Consults called: Orthopedics; SW  Admission status: It is my clinical opinion that referral for OBSERVATION is reasonable and necessary in this patient based on the above information provided. The aforementioned taken together are felt to place the patient at high risk for further clinical deterioration. However it is anticipated that the patient may be medically stable for discharge from the hospital within 24 to 48 hours.    Karmen Bongo MD [facility]   How to contact the [facility] Attending or Consulting provider [place] or covering provider during after hours [place], for this patient?  1. Check  the care team in [facility] and look for a) attending/consulting [place] provider listed and b) the [facility] team listed 2. Log into [email or web address] and use [place]'s universal  password to access. If you do not have the password, please contact the hospital operator. 3. Locate the [facility] provider you are looking for under [facility] and page to a number that you can be directly reached. 4. If you still have difficulty reaching the provider, please page the [facility] (Director on Call) for the Hospitalists listed on amion for assistance.   03/04/2019, 7:48 AM

## 2019-03-04 NOTE — ED Provider Notes (Signed)
   Patient signed out to me at 7 AM pending imaging and remaining lab work.  Patient appears to have had 2 unprovoked syncopal events.  History of high cholesterol, hypertension, MI.  EKG unremarkable.  Troponin within normal limits.  No signs of urinary tract infection.  Orthostatics are negative.  Head CT and neck CT are unremarkable.  Awaiting films of her hand, wrist.  Patient is on blood thinner.  Has a history of paroxysmal A. Fib.  No significant leukocytosis, anemia, electrolyte abnormality.  X-ray shows a small triquetral avulsion fracture in the right hand.  Hilbert Odor to evaluate hand fracture.  Patient has remained asymptomatic throughout my care.  Will admit for further syncope work-up.  This chart was dictated using voice recognition software.  Despite best efforts to proofread,  errors can occur which can change the documentation meaning.     Lennice Sites, DO 03/04/19 [phone]

## 2019-03-04 NOTE — Procedures (Addendum)
Patient Name: ZYIONNA BRACCIO  MRN: [id number]  Epilepsy Attending: Lora Havens  Referring Physician/Provider: Dr Karmen Bongo Date: 03/04/2019 Duration: 26.5mins  Patient history: 84yo F with syncope. EEG to evaluate for seizure  Level of alertness: awake, asleep  AEDs during EEG study: None  Technical aspects: This EEG study was done with scalp electrodes positioned according to the 10-20 International system of electrode placement. Electrical activity was acquired at a sampling rate of 500Hz  and reviewed with a high frequency filter of 70Hz  and a low frequency filter of 1Hz . EEG data were recorded continuously and digitally stored.   DESCRIPTION: During awake state, the posterior dominant rhythm consists of 8-9 Hz activity of moderate voltage (25-35 uV) seen predominantly in posterior head regions, symmetric and reactive to eye opening and eye closing. Sleep was characterized by vertex waves, generalized 5-6hz  slowing. Physiologic photic driving was seen during photic stimulation.   Hyperventilation was not performed.  IMPRESSION: This study is within normal limits. No seizures or epileptiform discharges were seen throughout the recording.  Martavis Gurney Barbra Sarks

## 2019-03-04 NOTE — Consult Note (Signed)
Reason for Consult:Triquetrum fx Referring Physician: A Cur[place]olo  Melinda [name] is an 84 y.o. female.  HPI: Share was at home in the bathroom. She got dizzy and fell. Her right hand was caught in the cabinet pull and she had pain afterwards. About 4h later she had an actual syncopal event where she lost consciousness and she came to the ED for evaluation. X-rays showed a triquetral fx and hand surgery was consulted. She is RHD.  Past Medical History:  Diagnosis Date  . Allergy   . Anemia 1984   before hysterectomy  . Breast cancer ([place])    bilateral  . GERD (gastroesophageal reflux disease)   . History of blood product transfusion 1984  . Hyperlipidemia   . Hypertension   . Macular degeneration    right eye  . Myocardial infarction ([place])   . Osteopenia   . Personal history of radiation therapy   . Takotsubo cardiomyopathy     Past Surgical History:  Procedure Laterality Date  . ABDOMINAL HYSTERECTOMY  1984  . APPENDECTOMY    . BREAST BIOPSY Right 04/29/05   right   . BREAST BIOPSY Left 2006  . BREAST LUMPECTOMY Left 04/19/04   left with sentinel node  . BREAST LUMPECTOMY Right 2007  . CATARACT EXTRACTION     bilateral  . EYE SURGERY    . LEFT HEART CATHETERIZATION WITH CORONARY ANGIOGRAM N/A 07/26/2013   Procedure: LEFT HEART CATHETERIZATION WITH CORONARY ANGIOGRAM;  Surgeon: Jettie Booze, MD;  Location: [facility] CATH LAB;  Service: Cardiovascular;  Laterality: N/A;  . RETINAL LASER PROCEDURE     right    Family History  Problem Relation Age of Onset  . Dementia Mother   . Stroke Mother   . Heart disease Mother 31  . Cancer Father        Prostate  . Heart disease Father 70  . Cancer Maternal Aunt        Breast  . Breast cancer Maternal Aunt   . Cancer Maternal Grandmother   . Breast cancer Maternal Grandmother   . Heart attack Maternal Grandfather   . Heart attack Paternal Grandfather     Social History:  reports that she quit smoking about 29 years  ago. She has never used smokeless tobacco. She reports current alcohol use of about 3.0 standard drinks of alcohol per week. She reports that she does not use drugs.  Allergies:  Allergies  Allergen Reactions  . Ibuprofen Shortness Of Breath and Other (See Comments)    Breathing issues  . Nsaids Hives, Shortness Of Breath and Other (See Comments)    Extreme hives and difficulty breathing. "Has had to go to the ER."  . Salicylates Other (See Comments)    Extreme hives and difficulty breathing. "Has had to go to the ER."  . Codeine Nausea Only  . Etodolac Rash  . Hydrochlorothiazide Other (See Comments)    Hyponatremia    Medications: I have reviewed the patient's current medications.  Results for orders placed or performed during the hospital encounter of 03/04/19 (from the past 48 hour(s))  CBC with Differential     Status: None   Collection Time: 03/04/19  5:53 AM  Result Value Ref Range   WBC 8.4 4.0 - 10.5 K/uL   RBC 4.19 3.87 - 5.11 MIL/uL   Hemoglobin 13.7 12.0 - 15.0 g/dL   HCT 41.4 36.0 - 46.0 %   MCV 98.8 80.0 - 100.0 fL   MCH 32.7 26.0 -  34.0 pg   MCHC 33.1 30.0 - 36.0 g/dL   RDW 12.1 11.5 - 15.5 %   Platelets 258 150 - 400 K/uL   nRBC 0.0 0.0 - 0.2 %   Neutrophils Relative % 69 %   Neutro Abs 5.9 1.7 - 7.7 K/uL   Lymphocytes Relative 21 %   Lymphs Abs 1.7 0.7 - 4.0 K/uL   Monocytes Relative 7 %   Monocytes Absolute 0.6 0.1 - 1.0 K/uL   Eosinophils Relative 1 %   Eosinophils Absolute 0.1 0.0 - 0.5 K/uL   Basophils Relative 1 %   Basophils Absolute 0.1 0.0 - 0.1 K/uL   Immature Granulocytes 1 %   Abs Immature Granulocytes 0.06 0.00 - 0.07 K/uL    Comment: Performed at [facility] [address], [place], [place] [address]  Troponin I (High Sensitivity)     Status: None   Collection Time: 03/04/19  5:53 AM  Result Value Ref Range   Troponin I (High Sensitivity) 13 <18 ng/L    Comment: (NOTE) Elevated high sensitivity troponin I (hsTnI) values and  significant  changes across serial measurements may suggest ACS but many other  chronic and acute conditions are known to elevate hsTnI results.  Refer to the "Links" section for chest pain algorithms and additional  guidance. Performed at [facility], [place] [address] [place], [place] [id number]   Basic metabolic panel     Status: Abnormal   Collection Time: 03/04/19  5:53 AM  Result Value Ref Range   Sodium 134 (L) 135 - 145 mmol/L   Potassium 3.7 3.5 - 5.1 mmol/L   Chloride 97 (L) 98 - 111 mmol/L   CO2 26 22 - 32 mmol/L   Glucose, Bld 99 70 - 99 mg/dL   BUN 10 8 - 23 mg/dL   Creatinine, Ser 0.54 0.44 - 1.00 mg/dL   Calcium 9.4 8.9 - 10.3 mg/dL   GFR calc non Af Amer >60 >60 mL/min   GFR calc Af Amer >60 >60 mL/min   Anion gap 11 5 - 15    Comment: Performed at [facility], [place] [address], [place], [place] [address]  Urinalysis, Routine w reflex microscopic     Status: Abnormal   Collection Time: 03/04/19  6:11 AM  Result Value Ref Range   Color, Urine YELLOW YELLOW   APPearance CLOUDY (A) CLEAR   Specific Gravity, Urine 1.013 1.005 - 1.030   pH 7.0 5.0 - 8.0   Glucose, UA NEGATIVE NEGATIVE mg/dL   Hgb urine dipstick NEGATIVE NEGATIVE   Bilirubin Urine NEGATIVE NEGATIVE   Ketones, ur NEGATIVE NEGATIVE mg/dL   Protein, ur 30 (A) NEGATIVE mg/dL   Nitrite NEGATIVE NEGATIVE   Leukocytes,Ua NEGATIVE NEGATIVE   RBC / HPF 0-5 0 - 5 RBC/hpf   WBC, UA 0-5 0 - 5 WBC/hpf   Bacteria, UA RARE (A) NONE SEEN   Squamous Epithelial / LPF 0-5 0 - 5   Mucus PRESENT    Budding Yeast PRESENT    Hyaline Casts, UA PRESENT    Amorphous Crystal PRESENT     Comment: Performed at [facility], [place] [address], [place], [place] [address]  I-stat chem 8, ED (not at [facility] or [facility])     Status: Abnormal   Collection Time: 03/04/19  7:17 AM  Result Value Ref Range   Sodium 134 (L) 135 - 145 mmol/L   Potassium 3.7 3.5 - 5.1 mmol/L  Chloride 96 (L) 98 - 111 mmol/L   BUN 12 8 - 23  mg/dL   Creatinine, Ser 0.50 0.44 - 1.00 mg/dL   Glucose, Bld 94 70 - 99 mg/dL   Calcium, Ion 1.12 (L) 1.15 - 1.40 mmol/L   TCO2 30 22 - 32 mmol/L   Hemoglobin 13.9 12.0 - 15.0 g/dL   HCT 41.0 36.0 - 46.0 %    Dg Chest 1 View  Result Date: 03/04/2019 CLINICAL DATA:  [place] on right side. EXAM: CHEST  1 VIEW COMPARISON:  Chest x-ray 04/04/2014 FINDINGS: The cardiac silhouette, mediastinal and hilar contours are within normal limits. There is mild tortuosity and calcification of the thoracic aorta. Chronic bronchitic type lung changes are noted with biapical scarring. No acute pulmonary findings. No pneumothorax or pleural effusion. The bony thorax is intact.  No definite rib fractures. IMPRESSION: No acute cardiopulmonary findings and intact bony thorax Electronically Signed   By: Marijo Sanes M.D.   On: 03/04/2019 07:21   Dg Wrist Complete Right  Result Date: 03/04/2019 CLINICAL DATA:  [place].  Injured wrist. EXAM: RIGHT WRIST - COMPLETE 3+ VIEW COMPARISON:  None. FINDINGS: The radius and ulna are intact. The intercarpal joint spaces are maintained. Mild degenerative changes. The lateral film demonstrates a triquetral avulsion fracture. Advanced degenerative changes at the [facility] joint of the thumb and mild to moderate degenerative changes at the scaphoid articulation with the trapezium and trapezoid bones. IMPRESSION: 1. Small triquetral avulsion fracture. 2. Severe degenerative changes at the [facility] joint of the thumb. Electronically Signed   By: Marijo Sanes M.D.   On: 03/04/2019 07:24   Ct Head Wo Contrast  Result Date: 03/04/2019 CLINICAL DATA:  Syncopal episode. Fall. Trauma to right hip. Patient denies headache or neck pain. Initial encounter. EXAM: CT HEAD WITHOUT CONTRAST CT CERVICAL SPINE WITHOUT CONTRAST TECHNIQUE: Multidetector CT imaging of the head and cervical spine was performed following the standard protocol without intravenous contrast. Multiplanar CT image reconstructions of the  cervical spine were also generated. COMPARISON:  MR head without contrast 01/01/2018. CT head without contrast 11/29/2013. FINDINGS: CT HEAD FINDINGS Brain: Mild atrophy and white matter changes are within normal limits for age. No acute infarct, hemorrhage, or mass lesion is present. The ventricles are of normal size. No significant extraaxial fluid collection is present. The brainstem and cerebellum are within normal limits. Vascular: Atherosclerotic calcifications are present within the cavernous internal carotid arteries and at the dural margin of the vertebral arteries bilaterally. No asymmetric hyperdense vessel is present. 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 No acute abnormalities are present. No significant extracranial soft tissue lesion is present. Sinuses/Orbits: The paranasal sinuses and mastoid air cells are clear. Bilateral lens replacements are noted. Globes and orbits are otherwise unremarkable. CT CERVICAL SPINE FINDINGS Alignment: There is straightening of the normal cervical lordosis. Skull base and vertebrae: Craniocervical junction is within normal limits. Vertebral body heights are maintained. No acute or healing fractures are present. Soft tissues and spinal canal: No prevertebral fluid or swelling. No visible canal hematoma. Atherosclerotic calcifications are present at the carotid bifurcations bilaterally. No focal soft tissue lesions are present in the neck. If Disc levels: Multilevel degenerative changes are present. Facet and uncovertebral spurring contribute to moderate to severe left foraminal stenosis at C3-4 and C4-5. Moderate foraminal narrowing bilaterally at C5-6 is worse on the left. Mild foraminal narrowing at C6-7 is worse on the left. Upper chest: The lung apices are clear. Thoracic inlet is normal. Atherosclerotic changes  are noted at the aortic arch and great vessel origins. IMPRESSION: 1. No acute trauma to the head or cervical spine. 2. Normal CT  appearance of the brain for age. 3. Multilevel degenerative changes of the cervical spine as described. 4.  Aortic Atherosclerosis (ICD10-I70.0). Electronically Signed   By: San Morelle M.D.   On: 03/04/2019 06:52   Ct Cervical Spine Wo Contrast  Result Date: 03/04/2019 CLINICAL DATA:  Syncopal episode. Fall. Trauma to right hip. Patient denies headache or neck pain. Initial encounter. EXAM: CT HEAD WITHOUT CONTRAST CT CERVICAL SPINE WITHOUT CONTRAST TECHNIQUE: Multidetector CT imaging of the head and cervical spine was performed following the standard protocol without intravenous contrast. Multiplanar CT image reconstructions of the cervical spine were also generated. COMPARISON:  MR head without contrast 01/01/2018. CT head without contrast 11/29/2013. FINDINGS: CT HEAD FINDINGS Brain: Mild atrophy and white matter changes are within normal limits for age. No acute infarct, hemorrhage, or mass lesion is present. The ventricles are of normal size. No significant extraaxial fluid collection is present. The brainstem and cerebellum are within normal limits. Vascular: Atherosclerotic calcifications are present within the cavernous internal carotid arteries and at the dural margin of the vertebral arteries bilaterally. No asymmetric hyperdense vessel is present. 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 No acute abnormalities are present. No significant extracranial soft tissue lesion is present. Sinuses/Orbits: The paranasal sinuses and mastoid air cells are clear. Bilateral lens replacements are noted. Globes and orbits are otherwise unremarkable. CT CERVICAL SPINE FINDINGS Alignment: There is straightening of the normal cervical lordosis. Skull base and vertebrae: Craniocervical junction is within normal limits. Vertebral body heights are maintained. No acute or healing fractures are present. Soft tissues and spinal canal: No prevertebral fluid or swelling. No visible canal hematoma.  Atherosclerotic calcifications are present at the carotid bifurcations bilaterally. No focal soft tissue lesions are present in the neck. If Disc levels: Multilevel degenerative changes are present. Facet and uncovertebral spurring contribute to moderate to severe left foraminal stenosis at C3-4 and C4-5. Moderate foraminal narrowing bilaterally at C5-6 is worse on the left. Mild foraminal narrowing at C6-7 is worse on the left. Upper chest: The lung apices are clear. Thoracic inlet is normal. Atherosclerotic changes are noted at the aortic arch and great vessel origins. IMPRESSION: 1. No acute trauma to the head or cervical spine. 2. Normal CT appearance of the brain for age. 3. Multilevel degenerative changes of the cervical spine as described. 4.  Aortic Atherosclerosis (ICD10-I70.0). Electronically Signed   By: San Morelle M.D.   On: 03/04/2019 06:52   Dg Hip Unilat With Pelvis 2-3 Views Right  Result Date: 03/04/2019 CLINICAL DATA:  [place].  Right hip pain. EXAM: DG HIP (WITH OR WITHOUT PELVIS) 2-3V RIGHT COMPARISON:  None. FINDINGS: Both hips are normally located. Mild/moderate degenerative changes. No acute fracture. No plain film findings for avascular necrosis. The pubic symphysis and SI joints are intact. IMPRESSION: No acute bony findings. Electronically Signed   By: Marijo Sanes M.D.   On: 03/04/2019 07:26    Review of Systems  Constitutional: Negative for weight loss.  HENT: Negative for ear discharge, ear pain, hearing loss and tinnitus.   Eyes: Negative for blurred vision, double vision, photophobia and pain.  Respiratory: Negative for cough, sputum production and shortness of breath.   Cardiovascular: Negative for chest pain.  Gastrointestinal: Negative for abdominal pain, nausea and vomiting.  Genitourinary: Negative for dysuria, flank pain, frequency and urgency.  Musculoskeletal: Positive for joint  pain (Right wrist). Negative for back pain, falls, myalgias and neck pain.   Neurological: Positive for loss of consciousness. Negative for dizziness, tingling, sensory change, focal weakness and headaches.  Endo/Heme/Allergies: Does not bruise/bleed easily.  Psychiatric/Behavioral: Negative for depression, memory loss and substance abuse. The patient is not nervous/anxious.    Blood pressure (!) 159/58, pulse 67, temperature 98.2 F (36.8 C), temperature source Oral, resp. rate (!) 24, height 5\' 3"  (1.6 m), weight 76.2 kg, SpO2 98 %. Physical Exam  Constitutional: She appears well-developed and well-nourished. No distress.  HENT:  Head: Normocephalic and atraumatic.  Eyes: Conjunctivae are normal. Right eye exhibits no discharge. Left eye exhibits no discharge. No scleral icterus.  Neck: Normal range of motion.  Cardiovascular: Normal rate and regular rhythm.  Respiratory: Effort normal. No respiratory distress.  Musculoskeletal:     Comments: Right shoulder, elbow, wrist, digits- no skin wounds, mod TTP dorsum of wrist, no instability, no blocks to motion  Sens  Ax/R/M/U intact  Mot   Ax/ R/ PIN/ M/ AIN/ U intact  Rad 2+  Neurological: She is alert.  Skin: Skin is warm and dry. She is not diaphoretic.  Psychiatric: She has a normal mood and affect. Her behavior is normal.    Assessment/Plan: Right triquetral fx -- Will splint, can likely manage non-operatively. F/u with Dr. Amedeo Plenty in office in a week or so. Syncope Multiple medical problems including Takotsubo cardiomyopathy; CAD; HTN; HLD; afib on Eliquis; and B breast cancer -- per primary service    Lisette Abu, PA-C Orthopedic Surgery [phone] 03/04/2019, 9:00 AM

## 2019-03-04 NOTE — Progress Notes (Signed)
  Echocardiogram 2D Echocardiogram has been performed.  Melinda [name] 03/04/2019, 10:13 AM

## 2019-03-04 NOTE — ED Provider Notes (Signed)
[place] EMERGENCY DEPARTMENT Provider Note   CSN: [id number] Arrival date & time: 03/04/19  0546     History   Chief Complaint Chief Complaint  Patient presents with  . Fall  . Loss of Consciousness    HPI Melinda [name] is a 84 y.o. female.     The history is provided by the patient.  Fall This is a recurrent problem. The current episode started 1 to 2 hours ago. The problem occurs constantly. The problem has not changed since onset.Pertinent negatives include no chest pain, no abdominal pain, no headaches and no shortness of breath. Associated symptoms comments: Right hip pain and wrist pain. Nothing aggravates the symptoms. Nothing relieves the symptoms. She has tried nothing for the symptoms. The treatment provided no relief.  Loss of Consciousness Associated symptoms: no chest pain, no fever, no headaches and no shortness of breath   Patient on Eliquis felt lightheaded and fell around midnight and then at 430 am had a syncopal event, she does not know why.  No f/c/r.  No CP no SOB.     Past Medical History:  Diagnosis Date  . Allergy   . Anemia 1984   before hysterectomy  . Breast cancer ([place])    bilateral  . Cancer ([place])    bilateral breast  . GERD (gastroesophageal reflux disease)   . History of blood product transfusion 1984  . Hyperlipidemia   . Hypertension   . Macular degeneration    right eye  . Myocardial infarction ([place])   . Osteopenia   . Personal history of radiation therapy   . Takotsubo cardiomyopathy     Patient Active Problem List   Diagnosis Date Noted  . Vasovagal syncope 12/31/2017  . Long term current use of anticoagulant 11/26/2015  . Allergic rhinitis due to pollen 11/23/2015  . Asthmatic bronchitis 11/23/2015  . History of colon polyps 11/23/2015  . Other specified disorders of bone density and structure, other site 11/23/2015  . Glaucoma 11/23/2015  . Mood disorder ([place]) 11/23/2015  . Vitamin D  deficiency 11/23/2015  . Paroxysmal atrial fibrillation ([place]) 12/08/2013  . Paroxysmal A-fib ([place]) 12/08/2013  . CAD (coronary artery disease) - 75% ostial OM1 10/21/2013  . Takotsubo cardiomyopathy   . NSTEMI (non-ST elevated myocardial infarction) ([place]) 07/25/2013  . Chest pain 09/09/2012  . GERD (gastroesophageal reflux disease) 09/09/2012  . Essential hypertension 09/09/2012  . Hypercholesterolemia 09/09/2012  . hx: breast cancer, left, invasive ductal carcinoma, right, DCIS 02/19/2011    Past Surgical History:  Procedure Laterality Date  . ABDOMINAL HYSTERECTOMY  1984  . APPENDECTOMY    . BREAST BIOPSY Right 04/29/05   right   . BREAST BIOPSY Left 2006  . BREAST LUMPECTOMY Left 04/19/04   left with sentinel node  . BREAST LUMPECTOMY Right 2007  . CATARACT EXTRACTION     bilateral  . EYE SURGERY    . LEFT HEART CATHETERIZATION WITH CORONARY ANGIOGRAM N/A 07/26/2013   Procedure: LEFT HEART CATHETERIZATION WITH CORONARY ANGIOGRAM;  Surgeon: Jettie Booze, MD;  Location: [facility] CATH LAB;  Service: Cardiovascular;  Laterality: N/A;  . RETINAL LASER PROCEDURE     right     OB History   No obstetric history on file.      Home Medications    Prior to Admission medications   Medication Sig Start Date End Date Taking? Authorizing Provider  acetaminophen (TYLENOL) 500 MG tablet Take 1,000 mg by mouth every 8 (eight) hours as needed (  for pain, headaches, or fever).     [provider]  albuterol (PROAIR HFA) 108 (90 Base) MCG/ACT inhaler Inhale 1-2 puffs into the lungs every 6 (six) hours as needed for wheezing or shortness of breath.    [provider]  atorvastatin (LIPITOR) 20 MG tablet Take 20 mg by mouth at bedtime.     [provider]  cholecalciferol (VITAMIN D) 1000 units tablet Take 1,000 Units by mouth daily.    [provider]  ELIQUIS 5 MG TABS tablet TAKE ONE TABLET BY MOUTH TWICE A DAY 12/22/18   Croitoru, Mihai, MD  fexofenadine  (ALLEGRA) 180 MG tablet Take 180 mg by mouth daily.     [provider]  hydrochlorothiazide (MICROZIDE) 12.5 MG capsule TAKE ONE CAPSULE BY MOUTH DAILY 02/19/18   Croitoru, Mihai, MD  latanoprost (XALATAN) 0.005 % ophthalmic solution Place 1 drop into both eyes at bedtime.  06/17/13   [provider]  losartan (COZAAR) 50 MG tablet Take 50 mg by mouth daily. 11/10/16   [provider]  metoprolol succinate (TOPROL-XL) 25 MG 24 hr tablet Take 1 tablet (25 mg total) by mouth daily. 01/08/18   Duke, Tami Lin, PA  nitroGLYCERIN (NITROSTAT) 0.4 MG SL tablet Place 1 tablet (0.4 mg total) under the tongue every 5 (five) minutes as needed for chest pain. 12/22/16   Croitoru, Mihai, MD  sertraline (ZOLOFT) 25 MG tablet Take 25 mg by mouth daily as needed for anxiety. 10/05/17   [provider]    Family History Family History  Problem Relation Age of Onset  . Dementia Mother   . Stroke Mother   . Heart disease Mother 88  . Cancer Father        Prostate  . Heart disease Father 63  . Cancer Maternal Aunt        Breast  . Breast cancer Maternal Aunt   . Cancer Maternal Grandmother   . Breast cancer Maternal Grandmother   . Heart attack Maternal Grandfather   . Heart attack Paternal Grandfather     Social History Social History   Tobacco Use  . Smoking status: Former Research scientist (life sciences)  . Smokeless tobacco: Never Used  Substance Use Topics  . Alcohol use: Yes    Alcohol/week: 3.0 standard drinks    Types: 3 Shots of liquor per week    Comment: 2-3 drinks containing a shot each of whiskey per night  . Drug use: No     Allergies   Ibuprofen, Nsaids, Salicylates, Codeine, Etodolac, and Hydrochlorothiazide   Review of Systems Review of Systems  Constitutional: Negative for fever.  HENT: Negative for congestion.   Eyes: Negative for visual disturbance.  Respiratory: Negative for shortness of breath.   Cardiovascular: Positive for syncope. Negative for chest  pain.  Gastrointestinal: Negative for abdominal pain.  Genitourinary: Negative for difficulty urinating.  Musculoskeletal: Positive for arthralgias.  Neurological: Positive for syncope and light-headedness. Negative for headaches.  Psychiatric/Behavioral: Negative for agitation.  All other systems reviewed and are negative.    Physical Exam Updated Vital Signs Temp 98.2 F (36.8 C) (Oral)   Ht 5\' 3"  (1.6 m)   Wt 76.2 kg   SpO2 100%   BMI 29.76 kg/m   Physical Exam Vitals signs and nursing note reviewed.  Constitutional:      General: She is not in acute distress.    Appearance: Normal appearance.  HENT:     Head: Normocephalic and atraumatic.     Nose: Nose  normal.  Eyes:     Extraocular Movements: Extraocular movements intact.     Conjunctiva/sclera: Conjunctivae normal.     Pupils: Pupils are equal, round, and reactive to light.  Neck:     Musculoskeletal: Normal range of motion and neck supple.  Cardiovascular:     Rate and Rhythm: Normal rate and regular rhythm.     Pulses: Normal pulses.     Heart sounds: Normal heart sounds.  Pulmonary:     Effort: Pulmonary effort is normal.     Breath sounds: Normal breath sounds.  Abdominal:     General: Abdomen is flat. Bowel sounds are normal.     Tenderness: There is no abdominal tenderness. There is no guarding or rebound.  Musculoskeletal:        General: No deformity.     Right hip: Normal.     Left hip: Normal.     Right knee: Normal.     Left knee: Normal.     Right ankle: Normal. Achilles tendon normal.     Left ankle: Normal. Achilles tendon normal.     Right hand: Normal. She exhibits normal capillary refill. Normal sensation noted. Normal strength noted.     Right upper leg: Normal.     Left upper leg: Normal.     Right lower leg: Normal.     Left lower leg: Normal.  Skin:    General: Skin is warm and dry.     Capillary Refill: Capillary refill takes less than 2 seconds.  Neurological:     General: No  focal deficit present.     Mental Status: She is alert.     Deep Tendon Reflexes: Reflexes normal.  Psychiatric:        Behavior: Behavior normal.      ED Treatments / Results  Labs (all labs ordered are listed, but only abnormal results are displayed) Results for orders placed or performed during the hospital encounter of 03/04/19  CBC with Differential  Result Value Ref Range   WBC 8.4 4.0 - 10.5 K/uL   RBC 4.19 3.87 - 5.11 MIL/uL   Hemoglobin 13.7 12.0 - 15.0 g/dL   HCT 41.4 36.0 - 46.0 %   MCV 98.8 80.0 - 100.0 fL   MCH 32.7 26.0 - 34.0 pg   MCHC 33.1 30.0 - 36.0 g/dL   RDW 12.1 11.5 - 15.5 %   Platelets 258 150 - 400 K/uL   nRBC 0.0 0.0 - 0.2 %   Neutrophils Relative % 69 %   Neutro Abs 5.9 1.7 - 7.7 K/uL   Lymphocytes Relative 21 %   Lymphs Abs 1.7 0.7 - 4.0 K/uL   Monocytes Relative 7 %   Monocytes Absolute 0.6 0.1 - 1.0 K/uL   Eosinophils Relative 1 %   Eosinophils Absolute 0.1 0.0 - 0.5 K/uL   Basophils Relative 1 %   Basophils Absolute 0.1 0.0 - 0.1 K/uL   Immature Granulocytes 1 %   Abs Immature Granulocytes 0.06 0.00 - 0.07 K/uL   No results found. ' EKG  EKG Interpretation  Date/Time:  Friday March 04 2019 05:56:12 EST Ventricular Rate:  67 PR Interval:    QRS Duration: 99 QT Interval:  433 QTC Calculation: 458 R Axis:   -18 Text Interpretation: Sinus rhythm Confirmed by Randal Buba, Kari Montero ([id number]) on 03/04/2019 6:48:21 AM       Radiology No results found.  Procedures Procedures (including critical care time)  Medications Ordered in ED Medications - No data to  display   Initial Impression / Assessment and Plan / ED Course    Melinda [name] was evaluated in Emergency Department on 03/04/2019 for the symptoms described in the history of present illness. She was evaluated in the context of the global COVID-19 pandemic, which necessitated consideration that the patient might be at risk for infection with the SARS-CoV-2 virus that causes  COVID-19. Institutional protocols and algorithms that pertain to the evaluation of patients at risk for COVID-19 are in a state of rapid change based on information released by regulatory bodies including the CDC and federal and state organizations. These policies and algorithms were followed during the patient's care in the ED.   Final Clinical Impressions(s) / ED Diagnoses   Final diagnoses:  Syncope    Signed out to Dr. Ronnald Nian pending labs and CT scan.  Patient will need admission for syncope.  COVID swab has been sent .     Rashonda Warrior, MD 03/04/19 [phone]

## 2019-03-04 NOTE — ED Notes (Signed)
Md paged about pt refusing x-ray of femur. Per previous shift pt as been talked to about this multiple times and still refuses. Pt has family talk to her about this too. Pt educated by this RN why x-ray is necessary. Pt still refuses.

## 2019-03-04 NOTE — ED Triage Notes (Addendum)
Pt here via EMS, pt had fall tonight at midnight-dizzy before fall but did not lose consciousness. Right hip and R wrist pain from fall. 0430 this morning, pt had syncopal episode when going to the bathroom-husband caught her so no injury from fall.  No orthostatic changes. Pt on Eliquis.

## 2019-03-04 NOTE — Progress Notes (Signed)
Orthopedic Tech Progress Note Patient Details:  Melinda [name] 1934-09-14 [id number]  Ortho Devices Type of Ortho Device: Ace wrap, Volar splint Ortho Device/Splint Location: right Ortho Device/Splint Interventions: Application   Post Interventions Patient Tolerated: Well Instructions Provided: Care of device   [place] Pink 03/04/2019, 11:13 AM

## 2019-03-05 DIAGNOSIS — F1010 Alcohol abuse, uncomplicated: Secondary | ICD-10-CM | POA: Diagnosis not present

## 2019-03-05 DIAGNOSIS — S62111A Displaced fracture of triquetrum [cuneiform] bone, right wrist, initial encounter for closed fracture: Secondary | ICD-10-CM | POA: Diagnosis not present

## 2019-03-05 DIAGNOSIS — R55 Syncope and collapse: Secondary | ICD-10-CM | POA: Diagnosis not present

## 2019-03-05 DIAGNOSIS — I10 Essential (primary) hypertension: Secondary | ICD-10-CM | POA: Diagnosis not present

## 2019-03-05 LAB — CBC
HCT: 35.6 % — ABNORMAL LOW (ref 36.0–46.0)
Hemoglobin: 11.9 g/dL — ABNORMAL LOW (ref 12.0–15.0)
MCH: 32.8 pg (ref 26.0–34.0)
MCHC: 33.4 g/dL (ref 30.0–36.0)
MCV: 98.1 fL (ref 80.0–100.0)
Platelets: 223 10*3/uL (ref 150–400)
RBC: 3.63 MIL/uL — ABNORMAL LOW (ref 3.87–5.11)
RDW: 12.5 % (ref 11.5–15.5)
WBC: 7.4 10*3/uL (ref 4.0–10.5)
nRBC: 0 % (ref 0.0–0.2)

## 2019-03-05 LAB — BASIC METABOLIC PANEL
Anion gap: 10 (ref 5–15)
BUN: 5 mg/dL — ABNORMAL LOW (ref 8–23)
CO2: 28 mmol/L (ref 22–32)
Calcium: 8.6 mg/dL — ABNORMAL LOW (ref 8.9–10.3)
Chloride: 96 mmol/L — ABNORMAL LOW (ref 98–111)
Creatinine, Ser: 0.51 mg/dL (ref 0.44–1.00)
GFR calc Af Amer: >60 mL/min (ref >60)
GFR calc non Af Amer: >60 mL/min (ref >60)
Glucose, Bld: 106 mg/dL — ABNORMAL HIGH (ref 70–99)
Potassium: 3.2 mmol/L — ABNORMAL LOW (ref 3.5–5.1)
Sodium: 134 mmol/L — ABNORMAL LOW (ref 135–145)

## 2019-03-05 LAB — MAGNESIUM: Magnesium: 1.9 mg/dL (ref 1.7–2.4)

## 2019-03-05 MED ORDER — POTASSIUM CHLORIDE CRYS ER 20 MEQ PO TBCR
40.0000 meq | EXTENDED_RELEASE_TABLET | Freq: Once | ORAL | Status: AC
  Administered 2019-03-05: 40 meq via ORAL
  Filled 2019-03-05: qty 2

## 2019-03-05 MED ORDER — TRAMADOL HCL 50 MG PO TABS: 50.0000 mg | ORAL_TABLET | Freq: Four times a day (QID) | ORAL | 0 refills | Status: AC | PRN

## 2019-03-05 NOTE — Discharge Instructions (Signed)
Cast or Splint Care, Adult Casts and splints are supports that are worn to protect broken bones and other injuries. A cast or splint may hold a bone still and in the correct position while it heals. Casts and splints may also help ease pain, swelling, and muscle spasms. A cast is a hardened support that is usually made of fiberglass or plaster. It is custom-fit to the body and it offers more protection than a splint. It cannot be taken off and put back on. A splint is a type of soft support that is usually made from cloth and elastic. It can be adjusted or taken off as needed. You may need a cast or a splint if you:  Have a broken bone.  Have a soft-tissue injury.  Need to keep an injured body part from moving (keep it immobile) after surgery. How is this treated? If you have a cast:   Do not stick anything inside the cast to scratch your skin. Sticking something in the cast increases your risk of infection.  Check the skin around the cast every day. Tell your health care provider about any concerns.  You may put lotion on dry skin around the edges of the cast. Do not put lotion on the skin underneath the cast.  Keep the cast clean.  If the cast is not waterproof: ? Do not let it get wet. ? Cover it with a watertight covering when you take a bath or a shower. If you have a splint:   Wear it as told by your health care provider. Remove it only as told by your health care provider.  Loosen the splint if your fingers or toes tingle, become numb, or turn cold and blue.  Keep the splint clean.  If the splint is not waterproof: ? Do not let it get wet. ? Cover it with a watertight covering when you take a bath or a shower. Bathing  Do not take baths or swim until your health care provider approves. Ask your health care provider if you can take showers. You may only be allowed to take sponge baths for bathing.  If your cast or splint is not waterproof, cover it with a watertight  covering when you take a bath or shower. Managing pain, stiffness, and swelling  Move your fingers or toes often to avoid stiffness and to lessen swelling.  Raise (elevate) the injured area above the level of your heart while sitting or lying down. Safety  Do not use the injured limb to support your body weight until your health care provider says that it is okay.  Use crutches or other assistive devices as told by your health care provider. General instructions  Do not put pressure on any part of the cast or splint until it is fully hardened. This may take several hours.  Return to your normal activities as told by your health care provider. Ask your health care provider what activities are safe for you.  Take over-the-counter and prescription medicines only as told by your health care provider.  Keep all follow-up visits as told by your health care provider. This is important. Contact a health care provider if:  Your cast or splint gets damaged.  The skin around the cast gets red or raw.  The skin under the cast is extremely itchy or painful.  Your cast or splint feels very uncomfortable.  Your cast or splint is too tight or too loose.  Your cast becomes wet or it develops  a soft spot or area.  You get an object stuck under your cast. Get help right away if:  Your pain is getting worse.  The injured area tingles, becomes numb, or turns cold and blue.  The part of your body above or below the cast is swollen and discolored.  You cannot feel or move your fingers or toes.  There is fluid leaking through the cast.  You have severe pain or pressure under the cast.  You have trouble breathing.  You have shortness of breath.  You have chest pain. This information is not intended to replace advice given to you by your health care provider. Make sure you discuss any questions you have with your health care provider. Document Released: 03/14/2000 Document Revised:  01/12/2017 Document Reviewed: 09/08/2015 Elsevier Patient Education  2020 [employer].

## 2019-03-05 NOTE — Progress Notes (Signed)
Melinda [name] to be discharged Home per MD order. Discussed prescriptions and follow up appointments with the patient. Prescriptions given to patient; medication list explained in detail. Patient verbalized understanding.  Skin clean, dry and intact without evidence of skin break down, no evidence of skin tears noted. IV catheter discontinued intact. Site without signs and symptoms of complications. Dressing and pressure applied. Pt denies pain at the site currently. No complaints noted.  Patient free of lines, drains, and wounds.   An After Visit Summary (AVS) was printed and given to the patient. Patient escorted via wheelchair, and discharged home via private auto.  [name], RN

## 2019-03-05 NOTE — Discharge Summary (Signed)
Physician Discharge Summary  Melinda [name]:[id number] DOB: [birth year] DOA: 03/04/2019  PCP: [name], MD  Admit date: 03/04/2019 Discharge date: 03/05/2019  Admitted From: home Discharge disposition: home   Recommendations for Outpatient Follow-Up:   1. HCTZ stopped per cardiology recommendation-- patient has a follow up next week with Dr. Loletha Grayer 2. Follow up with Dr. Amedeo Plenty next week   Discharge Diagnosis:   Principal Problem:   Syncope Active Problems:   Essential hypertension   Hypercholesterolemia   Takotsubo cardiomyopathy   Paroxysmal A-fib (HCC)   Mood disorder (HCC)   Long term current use of anticoagulant   Alcohol abuse   Fall at home, initial encounter hand fracture   Discharge Condition: Improved.  Diet recommendation: Low sodium, heart healthy  Wound care: None.  Code status: Full.   History of Present Illness:   Melinda [name] is a 84 y.o. female with medical history significant of Takotsubo cardiomyopathy; CAD; HTN; HLD; afib on Eliquis; and B breast cancer presenting with syncope.   She went in the bathroom after watching tv about 0030; when she got up to come back out she fell to the ground and landed on [name] hip and caught [name] hand while falling.  She did not lose consciousness this time.  She called for help and the [facility] came to assist.  She went back to bed and slept until about 430.  She used [name] cane to go back to the bathroom.  She passed out while on the commode.  She did not fall to the ground.  She was unconscious for several minutes and she was not very lucid when she came to.  She doesn't feel great and [name] hand hurts and she is tired and very thirsty.  She blacked out prior in Oct 2019 and she was dehydrated on a very hot day while doing tai chi.  She has not been feeling poorly leading up to this.  She did feel like [name] heart was racing after the first fall but that did not recur.   Hospital Course by Problem:    Syncope- most likely vasovagal -telemetry unrevealing -Orthostatic vital signs negative in ER -2d echo similar to prior: Left ventricular ejection fraction, by visual estimation, is 60 to 65%. The left ventricle has normal function. There is no left ventricular hypertrophy -EEG normal -patient does appear dry-- will d/c HCTZ as per Dr. Lurline Del prior note if syncope were to happen again  ETOH abuse -Patient with chronic ETOH dependence - she acknowledges drinking too much, denies h/o withdrawal -encouraged moderation  Fall with orthopedic injury(ies) -Has a R triquetral fracture -Orthopedics has consulted and will splint with plan for non-operative management at this time and outpatient hand surgery f/u -patient refused femur x ray  HTN -Continue home meds: Toprol and Cozaar -patient has appointment next week with cardiology so will defer to them to adjust medications if needed  CAD/afib -Rate controlled with Toprol -Continue Eliquis -No current concern for ACS -This was previously a concern for tachy-brady syndrome but overnight tele shows HR in the 80s- 1 pvc -Also with h/o Takotsubo CM, but last echo showed normal EF and grade 1 diastolic dysfunction; repeat echo is stable -Continue Lipitor  Depression -Continue Zoloft     Medical Consultants:   Orthopedics- hand   Discharge Exam:    Vitals:   03/04/19 2100 03/04/19 2157 03/05/19 0440  BP: (!) 155/62 (!) 158/71 (!) 148/57  Pulse: 88 79 80  Resp: Marland Kitchen)  '31 17 17  ' Temp:  99.6 F (37.6 C) 98.7 F (37.1 C)  TempSrc:  Oral Oral  SpO2: 100% 98% 93%  Weight:  76.1 kg   Height:  '5\' 3"'  (1.6 m)     General exam: Appears calm and comfortable.  The results of significant diagnostics from this hospitalization (including imaging, microbiology, ancillary and laboratory) are listed below for reference.     Procedures and Diagnostic Studies:   Dg Chest 1 View  Result Date: 03/04/2019 CLINICAL DATA:  [place] on right  side. EXAM: CHEST  1 VIEW COMPARISON:  Chest x-ray 04/04/2014 FINDINGS: The cardiac silhouette, mediastinal and hilar contours are within normal limits. There is mild tortuosity and calcification of the thoracic aorta. Chronic bronchitic type lung changes are noted with biapical scarring. No acute pulmonary findings. No pneumothorax or pleural effusion. The bony thorax is intact.  No definite rib fractures. IMPRESSION: No acute cardiopulmonary findings and intact bony thorax Electronically Signed   By: Marijo Sanes M.D.   On: 03/04/2019 07:21   Dg Wrist Complete Right  Result Date: 03/04/2019 CLINICAL DATA:  [place].  Injured wrist. EXAM: RIGHT WRIST - COMPLETE 3+ VIEW COMPARISON:  None. FINDINGS: The radius and ulna are intact. The intercarpal joint spaces are maintained. Mild degenerative changes. The lateral film demonstrates a triquetral avulsion fracture. Advanced degenerative changes at the [facility] joint of the thumb and mild to moderate degenerative changes at the scaphoid articulation with the trapezium and trapezoid bones. IMPRESSION: 1. Small triquetral avulsion fracture. 2. Severe degenerative changes at the [facility] joint of the thumb. Electronically Signed   By: Marijo Sanes M.D.   On: 03/04/2019 07:24   Ct Head Wo Contrast  Result Date: 03/04/2019 CLINICAL DATA:  Syncopal episode. Fall. Trauma to right hip. Patient denies headache or neck pain. Initial encounter. EXAM: CT HEAD WITHOUT CONTRAST CT CERVICAL SPINE WITHOUT CONTRAST TECHNIQUE: Multidetector CT imaging of the head and cervical spine was performed following the standard protocol without intravenous contrast. Multiplanar CT image reconstructions of the cervical spine were also generated. COMPARISON:  MR head without contrast 01/01/2018. CT head without contrast 11/29/2013. FINDINGS: CT HEAD FINDINGS Brain: Mild atrophy and white matter changes are within normal limits for age. No acute infarct, hemorrhage, or mass lesion is present. The  ventricles are of normal size. No significant extraaxial fluid collection is present. The brainstem and cerebellum are within normal limits. Vascular: Atherosclerotic calcifications are present within the cavernous internal carotid arteries and at the dural margin of the vertebral arteries bilaterally. No asymmetric hyperdense vessel is present. 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 No acute abnormalities are present. No significant extracranial soft tissue lesion is present. Sinuses/Orbits: The paranasal sinuses and mastoid air cells are clear. Bilateral lens replacements are noted. Globes and orbits are otherwise unremarkable. CT CERVICAL SPINE FINDINGS Alignment: There is straightening of the normal cervical lordosis. Skull base and vertebrae: Craniocervical junction is within normal limits. Vertebral body heights are maintained. No acute or healing fractures are present. Soft tissues and spinal canal: No prevertebral fluid or swelling. No visible canal hematoma. Atherosclerotic calcifications are present at the carotid bifurcations bilaterally. No focal soft tissue lesions are present in the neck. If Disc levels: Multilevel degenerative changes are present. Facet and uncovertebral spurring contribute to moderate to severe left foraminal stenosis at C3-4 and C4-5. Moderate foraminal narrowing bilaterally at C5-6 is worse on the left. Mild foraminal narrowing at C6-7 is worse on the left. Upper chest: The lung apices are  clear. Thoracic inlet is normal. Atherosclerotic changes are noted at the aortic arch and great vessel origins. IMPRESSION: 1. No acute trauma to the head or cervical spine. 2. Normal CT appearance of the brain for age. 3. Multilevel degenerative changes of the cervical spine as described. 4.  Aortic Atherosclerosis (ICD10-I70.0). Electronically Signed   By: San Morelle M.D.   On: 03/04/2019 06:52   Ct Cervical Spine Wo Contrast  Result Date: 03/04/2019 CLINICAL DATA:   Syncopal episode. Fall. Trauma to right hip. Patient denies headache or neck pain. Initial encounter. EXAM: CT HEAD WITHOUT CONTRAST CT CERVICAL SPINE WITHOUT CONTRAST TECHNIQUE: Multidetector CT imaging of the head and cervical spine was performed following the standard protocol without intravenous contrast. Multiplanar CT image reconstructions of the cervical spine were also generated. COMPARISON:  MR head without contrast 01/01/2018. CT head without contrast 11/29/2013. FINDINGS: CT HEAD FINDINGS Brain: Mild atrophy and white matter changes are within normal limits for age. No acute infarct, hemorrhage, or mass lesion is present. The ventricles are of normal size. No significant extraaxial fluid collection is present. The brainstem and cerebellum are within normal limits. Vascular: Atherosclerotic calcifications are present within the cavernous internal carotid arteries and at the dural margin of the vertebral arteries bilaterally. No asymmetric hyperdense vessel is present. 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 No acute abnormalities are present. No significant extracranial soft tissue lesion is present. Sinuses/Orbits: The paranasal sinuses and mastoid air cells are clear. Bilateral lens replacements are noted. Globes and orbits are otherwise unremarkable. CT CERVICAL SPINE FINDINGS Alignment: There is straightening of the normal cervical lordosis. Skull base and vertebrae: Craniocervical junction is within normal limits. Vertebral body heights are maintained. No acute or healing fractures are present. Soft tissues and spinal canal: No prevertebral fluid or swelling. No visible canal hematoma. Atherosclerotic calcifications are present at the carotid bifurcations bilaterally. No focal soft tissue lesions are present in the neck. If Disc levels: Multilevel degenerative changes are present. Facet and uncovertebral spurring contribute to moderate to severe left foraminal stenosis at C3-4 and  C4-5. Moderate foraminal narrowing bilaterally at C5-6 is worse on the left. Mild foraminal narrowing at C6-7 is worse on the left. Upper chest: The lung apices are clear. Thoracic inlet is normal. Atherosclerotic changes are noted at the aortic arch and great vessel origins. IMPRESSION: 1. No acute trauma to the head or cervical spine. 2. Normal CT appearance of the brain for age. 3. Multilevel degenerative changes of the cervical spine as described. 4.  Aortic Atherosclerosis (ICD10-I70.0). Electronically Signed   By: San Morelle M.D.   On: 03/04/2019 06:52   Dg Hip Unilat With Pelvis 2-3 Views Right  Result Date: 03/04/2019 CLINICAL DATA:  [place].  Right hip pain. EXAM: DG HIP (WITH OR WITHOUT PELVIS) 2-3V RIGHT COMPARISON:  None. FINDINGS: Both hips are normally located. Mild/moderate degenerative changes. No acute fracture. No plain film findings for avascular necrosis. The pubic symphysis and SI joints are intact. IMPRESSION: No acute bony findings. Electronically Signed   By: Marijo Sanes M.D.   On: 03/04/2019 07:26     Labs:   Basic Metabolic Panel: Recent Labs  Lab 03/04/19 0553 03/04/19 0717 03/05/19 0603 03/05/19 0754  NA 134* 134* 134*  --   K 3.7 3.7 3.2*  --   CL 97* 96* 96*  --   CO2 26  --  28  --   GLUCOSE 99 94 106*  --   BUN 10 12 5*  --  CREATININE 0.54 0.50 0.51  --   CALCIUM 9.4  --  8.6*  --   MG  --   --   --  1.9   GFR Estimated Creatinine Clearance: 53 mL/min (by C-G formula based on SCr of 0.51 mg/dL). Liver Function Tests: No results for input(s): AST, ALT, ALKPHOS, BILITOT, PROT, ALBUMIN in the last 168 hours. No results for input(s): LIPASE, AMYLASE in the last 168 hours. No results for input(s): AMMONIA in the last 168 hours. Coagulation profile No results for input(s): INR, PROTIME in the last 168 hours.  CBC: Recent Labs  Lab 03/04/19 0553 03/04/19 0717 03/05/19 0603  WBC 8.4  --  7.4  NEUTROABS 5.9  --   --   HGB 13.7 13.9 11.9*    HCT 41.4 41.0 35.6*  MCV 98.8  --  98.1  PLT 258  --  223   Cardiac Enzymes: No results for input(s): CKTOTAL, CKMB, CKMBINDEX, TROPONINI in the last 168 hours. BNP: Invalid input(s): POCBNP CBG: No results for input(s): GLUCAP in the last 168 hours. D-Dimer No results for input(s): DDIMER in the last 72 hours. Hgb A1c No results for input(s): HGBA1C in the last 72 hours. Lipid Profile No results for input(s): CHOL, HDL, LDLCALC, TRIG, CHOLHDL, LDLDIRECT in the last 72 hours. Thyroid function studies Recent Labs    03/04/19 0935  TSH 3.100   Anemia work up No results for input(s): VITAMINB12, FOLATE, FERRITIN, TIBC, IRON, RETICCTPCT in the last 72 hours. Microbiology Recent Results (from the past 240 hour(s))  SARS CORONAVIRUS 2 (TAT 6-24 HRS) Nasopharyngeal Nasopharyngeal Swab     Status: None   Collection Time: 03/04/19  6:57 AM   Specimen: Nasopharyngeal Swab  Result Value Ref Range Status   SARS Coronavirus 2 NEGATIVE NEGATIVE Final    Comment: (NOTE) SARS-CoV-2 target nucleic acids are NOT DETECTED. The SARS-CoV-2 RNA is generally detectable in upper and lower respiratory specimens during the acute phase of infection. Negative results do not preclude SARS-CoV-2 infection, do not rule out co-infections with other pathogens, and should not be used as the sole basis for treatment or other patient management decisions. Negative results must be combined with clinical observations, patient history, and epidemiological information. The expected result is Negative. Fact Sheet for Patients: SugarRoll.be Fact Sheet for Healthcare Providers: https://www.woods-mathews.com/ This test is not yet approved or cleared by the [place] FDA and  has been authorized for detection and/or diagnosis of SARS-CoV-2 by FDA under an Emergency Use Authorization (EUA). This EUA will remain  in effect (meaning this test can be used) for the  duration of the COVID-19 declaration under Section 56 4(b)(1) of the Act, 21 U.S.C. section 360bbb-3(b)(1), unless the authorization is terminated or revoked sooner. Performed at [facility], [place] [address], [place], [place] [address]      Discharge Instructions:   Discharge Instructions    Diet - low sodium heart healthy   Complete by: As directed    Discharge instructions   Complete by: As directed    Do not take pain medications with alcohol Do not drive while taking pain medications Pain medications can be constipating so please add stool softeners to you daily regimen Keep hand elevated   Increase activity slowly   Complete by: As directed      Allergies as of 03/05/2019      Reactions   Ibuprofen Shortness Of Breath, Other (See Comments)   Breathing issues   Nsaids Hives, Shortness Of Breath,  Other (See Comments)   Extreme hives and difficulty breathing. "Has had to go to the ER."   Salicylates Other (See Comments)   Extreme hives and difficulty breathing. "Has had to go to the ER."   Codeine Nausea Only   Etodolac Rash   Hydrochlorothiazide Other (See Comments)   Hyponatremia      Medication List    STOP taking these medications   hydrochlorothiazide 12.5 MG capsule Commonly known as: MICROZIDE     TAKE these medications   acetaminophen 500 MG tablet Commonly known as: TYLENOL Take 1,000 mg by mouth at bedtime as needed (for pain, headaches, or fever).   atorvastatin 20 MG tablet Commonly known as: LIPITOR Take 20 mg by mouth at bedtime.   cholecalciferol 1000 units tablet Commonly known as: VITAMIN D Take 1,000 Units by mouth daily.   Eliquis 5 MG Tabs tablet Generic drug: apixaban TAKE ONE TABLET BY MOUTH TWICE A DAY What changed: how much to take   fexofenadine 180 MG tablet Commonly known as: ALLEGRA Take 180 mg by mouth daily.   latanoprost 0.005 % ophthalmic solution Commonly known as: XALATAN Place 1 drop into both eyes at  bedtime.   losartan 50 MG tablet Commonly known as: COZAAR Take 50 mg by mouth daily.   metoprolol succinate 25 MG 24 hr tablet Commonly known as: TOPROL-XL Take 1 tablet (25 mg total) by mouth daily. What changed: how much to take   nitroGLYCERIN 0.4 MG SL tablet Commonly known as: NITROSTAT Place 1 tablet (0.4 mg total) under the tongue every 5 (five) minutes as needed for chest pain.   ProAir HFA 108 (90 Base) MCG/ACT inhaler Generic drug: albuterol Inhale 1-2 puffs into the lungs every 6 (six) hours as needed for wheezing or shortness of breath.   sertraline 25 MG tablet Commonly known as: ZOLOFT Take 25 mg by mouth daily as needed for anxiety.   timolol 0.5 % ophthalmic solution Commonly known as: TIMOPTIC Place 1 drop into the right eye every morning.   traMADol 50 MG tablet Commonly known as: Ultram Take 1 tablet (50 mg total) by mouth every 6 (six) hours as needed for up to 5 days for severe pain.      Follow-up Information    [name], MD Follow up in 1 week(s).   Specialty: Family Medicine Contact information: [address] [place] [address] [phone]        Sanda Klein, MD Follow up.   Specialty: Cardiology Why: as scheduled Contact information: [address] [place] [address] [phone]        Roseanne Kaufman, MD Follow up.   Specialty: Orthopedic Surgery Contact information: [address] [place] [address] [phone]            Time coordinating discharge: 25 min  Signed:  Geradine Girt DO  [facility] 03/05/2019, 2:33 PM

## 2019-03-09 ENCOUNTER — Telehealth: Payer: Self-pay | Admitting: Cardiovascular Disease

## 2019-03-09 NOTE — Telephone Encounter (Signed)
New message    Spouse requesting to accompany patient to 12/10 appointment

## 2019-03-09 NOTE — Telephone Encounter (Signed)
Spoke with husband - patient had a fall over the weekend and is not getting around easily. He is OK with getting her to the check in area and then waiting on her, as long as someone can assist her to the appointment area. Advised this can be arranged.   Patient has appt with Dr. Amedeo Plenty @ [facility] @ 2:40pm as well  Routed to [employer]

## 2019-03-10 ENCOUNTER — Other Ambulatory Visit: Payer: Self-pay

## 2019-03-10 ENCOUNTER — Ambulatory Visit (INDEPENDENT_AMBULATORY_CARE_PROVIDER_SITE_OTHER): Payer: Medicare Other | Admitting: Cardiovascular Disease

## 2019-03-10 ENCOUNTER — Encounter: Payer: Self-pay | Admitting: Cardiovascular Disease

## 2019-03-10 VITALS — BP 189/96 | HR 79 | Temp 97.0°F | Ht 62.0 in | Wt 171.0 lb

## 2019-03-10 DIAGNOSIS — M25531 Pain in right wrist: Secondary | ICD-10-CM | POA: Diagnosis not present

## 2019-03-10 DIAGNOSIS — Z7901 Long term (current) use of anticoagulants: Secondary | ICD-10-CM | POA: Diagnosis not present

## 2019-03-10 DIAGNOSIS — R55 Syncope and collapse: Secondary | ICD-10-CM | POA: Diagnosis not present

## 2019-03-10 DIAGNOSIS — I10 Essential (primary) hypertension: Secondary | ICD-10-CM | POA: Diagnosis not present

## 2019-03-10 DIAGNOSIS — I2510 Atherosclerotic heart disease of native coronary artery without angina pectoris: Secondary | ICD-10-CM

## 2019-03-10 DIAGNOSIS — S62114A Nondisplaced fracture of triquetrum [cuneiform] bone, right wrist, initial encounter for closed fracture: Secondary | ICD-10-CM

## 2019-03-10 DIAGNOSIS — M545 Low back pain: Secondary | ICD-10-CM | POA: Diagnosis not present

## 2019-03-10 DIAGNOSIS — I480 Paroxysmal atrial fibrillation: Secondary | ICD-10-CM

## 2019-03-10 DIAGNOSIS — E7800 Pure hypercholesterolemia, unspecified: Secondary | ICD-10-CM | POA: Diagnosis not present

## 2019-03-10 DIAGNOSIS — M4856XA Collapsed vertebra, not elsewhere classified, lumbar region, initial encounter for fracture: Secondary | ICD-10-CM | POA: Diagnosis not present

## 2019-03-10 NOTE — Progress Notes (Signed)
Cardiology Office Note    Date:  03/11/2019   ID:  Mercedez, Roundtree [birth year], MRN [id number]  PCP:  [name], MD  Cardiologist:   Sanda Klein, MD   Chief Complaint  Patient presents with  . Loss of Consciousness    History of Present Illness:  Melinda [name] is a 84 y.o. female with a history of paroxysmal atrial fibrillation, takotsubo cardiomyopathy with complete resolution, minor coronary artery disease with 75% stenosis of a first oblique marginal artery (asymptomatic). Last echo in October 2019 showed normal LVEF 55-60%.  Melinda [name] had a brief hospitalization last week following 2 episodes of syncope.  One occurred while she was returning to the bathroom.  She recovered after falling, with her husband's assistance.  She fell on her hip and caught her hand underneath her and has a wrist fracture.  She went back to bed but then when she got out to use the restroom again she lost consciousness while sitting on the commode.  Her husband prevented her from falling by holding onto her neck count, and this time she was unconscious for a few minutes.  On both occasions she was diaphoretic.  She has a right triquetral wrist fracture and will be seeing Dr. Amedeo Plenty later today.  Her hand is in a cast.  Thankfully she never had head impact.  She did not have any serious bleeding problems but has a bruise on her bottom.  While hospitalized she did not have any evidence of arrhythmia on telemetry.  Cardiac enzymes were normal and her EKG was unremarkable.  She had a normal echocardiogram.  Her hydrochlorothiazide was stopped.  She also had a normal EEG.  October 2019 she had an episode of syncope associated with prolonged exposure to heat and dehydration while exercising at the gym, with prodromal dizziness, flushing and blurry vision.  It sounded very much like a vasovagal event.  She recalls having a similar syncopal event at a young age during a wedding.  Prior to these events  she was feeling well.  She exercises regularly without any complaints of exertional angina or dyspnea.  She is now a resident at [facility].    Notes from her hospitalization point out of concern for chronic alcohol use.  The patient acknowledges "drinking too much" but does not have a history of withdrawal or frank alcoholism.    Past Medical History:  Diagnosis Date  . Allergy   . Anemia 1984   before hysterectomy  . Breast cancer ([place])    bilateral  . GERD (gastroesophageal reflux disease)   . History of blood product transfusion 1984  . Hyperlipidemia   . Hypertension   . Macular degeneration    right eye  . Myocardial infarction ([place])   . Osteopenia   . Personal history of radiation therapy   . Takotsubo cardiomyopathy     Past Surgical History:  Procedure Laterality Date  . ABDOMINAL HYSTERECTOMY  1984  . APPENDECTOMY    . BREAST BIOPSY Right 04/29/05   right   . BREAST BIOPSY Left 2006  . BREAST LUMPECTOMY Left 04/19/04   left with sentinel node  . BREAST LUMPECTOMY Right 2007  . CATARACT EXTRACTION     bilateral  . EYE SURGERY    . LEFT HEART CATHETERIZATION WITH CORONARY ANGIOGRAM N/A 07/26/2013   Procedure: LEFT HEART CATHETERIZATION WITH CORONARY ANGIOGRAM;  Surgeon: Jettie Booze, MD;  Location: [facility] CATH LAB;  Service: Cardiovascular;  Laterality: N/A;  . RETINAL LASER  PROCEDURE     right    Current Medications: Outpatient Medications Prior to Visit  Medication Sig Dispense Refill  . acetaminophen (TYLENOL) 500 MG tablet Take 1,000 mg by mouth at bedtime as needed (for pain, headaches, or fever).     Marland Kitchen albuterol (PROAIR HFA) 108 (90 Base) MCG/ACT inhaler Inhale 1-2 puffs into the lungs every 6 (six) hours as needed for wheezing or shortness of breath.    Marland Kitchen atorvastatin (LIPITOR) 20 MG tablet Take 20 mg by mouth at bedtime.     . cholecalciferol (VITAMIN D) 1000 units tablet Take 1,000 Units by mouth daily.    . fexofenadine (ALLEGRA) 180 MG tablet Take  180 mg by mouth daily.     Marland Kitchen latanoprost (XALATAN) 0.005 % ophthalmic solution Place 1 drop into both eyes at bedtime.     Marland Kitchen losartan (COZAAR) 50 MG tablet Take 50 mg by mouth daily.    . metoprolol succinate (TOPROL-XL) 25 MG 24 hr tablet Take 1 tablet (25 mg total) by mouth daily. (Patient taking differently: Take 12.5 mg by mouth daily. ) 30 tablet 3  . nitroGLYCERIN (NITROSTAT) 0.4 MG SL tablet Place 1 tablet (0.4 mg total) under the tongue every 5 (five) minutes as needed for chest pain. 25 tablet 3  . sertraline (ZOLOFT) 25 MG tablet Take 25 mg by mouth daily as needed for anxiety.    . timolol (TIMOPTIC) 0.5 % ophthalmic solution Place 1 drop into the right eye every morning.    . traMADol (ULTRAM) 50 MG tablet Take 1 tablet (50 mg total) by mouth every 6 (six) hours as needed for up to 5 days for severe pain. 14 tablet 0  . ELIQUIS 5 MG TABS tablet TAKE ONE TABLET BY MOUTH TWICE A DAY (Patient taking differently: Take 5 mg by mouth 2 (two) times daily. ) 180 tablet 1   No facility-administered medications prior to visit.     Allergies:   Ibuprofen, Nsaids, Salicylates, Codeine, Etodolac, and Hydrochlorothiazide   Social History   Socioeconomic History  . Marital status: Married    Spouse name: Not on file  . Number of children: Not on file  . Years of education: Not on file  . Highest education level: Not on file  Occupational History  . Not on file  Tobacco Use  . Smoking status: Former Smoker    Quit date: 1991    Years since quitting: 29.9  . Smokeless tobacco: Never Used  Substance and Sexual Activity  . Alcohol use: Yes    Alcohol/week: 3.0 standard drinks    Types: 3 Shots of liquor per week    Comment: 3 drinks per night, "too much"  . Drug use: No  . Sexual activity: Not on file  Other Topics Concern  . Not on file  Social History Narrative  . Not on file   Social Determinants of Health   Financial Resource Strain:   . Difficulty of Paying Living Expenses:  Not on file  Food Insecurity:   . Worried About Charity fundraiser in the Last Year: Not on file  . Ran Out of Food in the Last Year: Not on file  Transportation Needs:   . Lack of Transportation (Medical): Not on file  . Lack of Transportation (Non-Medical): Not on file  Physical Activity:   . Days of Exercise per Week: Not on file  . Minutes of Exercise per Session: Not on file  Stress:   . Feeling of Stress :  Not on file  Social Connections:   . Frequency of Communication with Friends and Family: Not on file  . Frequency of Social Gatherings with Friends and Family: Not on file  . Attends Religious Services: Not on file  . Active Member of Clubs or Organizations: Not on file  . Attends Archivist Meetings: Not on file  . Marital Status: Not on file     Family History:  The patient's family history includes Breast cancer in her maternal aunt and maternal grandmother; Cancer in her father, maternal aunt, and maternal grandmother; Dementia in her mother; Heart attack in her maternal grandfather and paternal grandfather; Heart disease (age of onset: 68) in her father; Heart disease (age of onset: 64) in her mother; Stroke in her mother.   ROS:   Please see the history of present illness.    ROS all other systems are reviewed and are negative   PHYSICAL EXAM:   VS:  BP (!) 189/96   Pulse 79   Temp (!) 97 F (36.1 C)   Ht 5\' 2"  (1.575 m)   Wt 171 lb (77.6 kg)   SpO2 98%   BMI 31.28 kg/m      General: Alert, oriented x3, no distress, mildly obese Head: no evidence of trauma, PERRL, EOMI, no exophtalmos or lid lag, no myxedema, no xanthelasma; normal ears, nose and oropharynx Neck: normal jugular venous pulsations and no hepatojugular reflux; brisk carotid pulses without delay and no carotid bruits Chest: clear to auscultation, no signs of consolidation by percussion or palpation, normal fremitus, symmetrical and full respiratory excursions Cardiovascular: normal  position and quality of the apical impulse, regular rhythm, normal first and second heart sounds, no murmurs, rubs or gallops Abdomen: no tenderness or distention, no masses by palpation, no abnormal pulsatility or arterial bruits, normal bowel sounds, no hepatosplenomegaly Extremities: Cast right wrist, no clubbing, cyanosis or edema; 2+ radial, ulnar and brachial pulses on the left side; 2+ right femoral, posterior tibial and dorsalis pedis pulses; 2+ left femoral, posterior tibial and dorsalis pedis pulses; no subclavian or femoral bruits Neurological: grossly nonfocal Psych: Normal mood and affect     Wt Readings from Last 3 Encounters:  03/10/19 171 lb (77.6 kg)  03/04/19 167 lb 12.3 oz (76.1 kg)  03/08/18 172 lb (78 kg)      Studies/Labs Reviewed:   ECHO 03/04/2019:  1. Left ventricular ejection fraction, by visual estimation, is 60 to 65%. The left ventricle has normal function. There is no left ventricular hypertrophy.  2. Global right ventricle has normal systolic function.The right ventricular size is normal. No increase in right ventricular wall thickness.  3. Left atrial size was normal.  4. Right atrial size was normal.  5. The mitral valve is grossly normal. No evidence of mitral valve regurgitation.  6. The tricuspid valve is grossly normal. Tricuspid valve regurgitation is not demonstrated.  7. Aortic valve regurgitation is mild to moderate.  8. The aortic valve is tricuspid. Aortic valve regurgitation is mild to moderate. Mild to moderate aortic valve stenosis.  9. The pulmonic valve was grossly normal. Pulmonic valve regurgitation is trivial. 10. The atrial septum is grossly normal.  EKG:  EKG is ordered today.  Shows normal sinus rhythm, normal tracing, QTC 431 ms  Recent Labs: 03/05/2019 hemoglobin 11.9, creatinine 0.51, potassium 3.2 Lipid Panel    Component Value Date/Time   CHOL 159 07/26/2013 0718   TRIG 86 07/26/2013 0718   HDL 89 07/26/2013 0718  CHOLHDL 1.8 07/26/2013 0718   VLDL 17 07/26/2013 0718   LDLCALC 53 07/26/2013 0718  12/02/2018 Total cholesterol 176, HDL 80, LDL 81, triglycerides 79, hemoglobin A1c 5.4%  09/17/2016 Total cholesterol 166, HDL 71, LDL 79, triglycerides 80 Normal LFTs, creatinine 0.56, glucose 122, potassium 3.8  ASSESSMENT:    1. Paroxysmal atrial fibrillation (HCC)   2. Long term current use of anticoagulant   3. Syncope and collapse   4. Coronary artery disease involving native coronary artery of native heart without angina pectoris   5. Hypercholesterolemia   6. Essential hypertension   7. Closed nondisplaced fracture of triquetrum of right wrist, initial encounter      PLAN:  In order of problems listed above:  1. AFib: She has had both atrial tachycardia and less frequently paroxysmal atrial fibrillation (see ECG 12-08-2013).  No arrhythmia was detected during her hospitalization.  This does not exclude the possibility of postconversion pauses as a cause of syncope, although the history is more suggestive of a vasovagal event. Compliant with anticoagulation. CHADSVasc 5 (age 42, gender, HTN, CAD). 2. Eliquis: Use of anticoagulation becomes more more concerning with her frequent falls.  For the time being I have asked her to stop the Eliquis.  She has never had overt bleeding complications.  She does not have a history of stroke or TIA. 3. Syncope: All of her syncopal events are compatible with vasovagal mechanism, but the increase in frequency and the seriousness of her injury give me pause.  I think we need to exclude arrhythmic etiology.  I have recommended implantation of a loop recorder.  This procedure has been fully reviewed with the patient and written informed consent has been obtained.  Also advised the reduction in alcohol use and increased intake of fluids without caffeine. 4. CAD: This was discovered incidentally when she had coronary angiography for takotsubo syndrome.  She has never had  angina despite being quite active for her age.   5. HLP: All her lipid parameters are excellent. 6. HTN: Well-controlled usually, she is in a lot of discomfort from her back injury today.  No changes were made to her medications.  Avoid diuretics. 7. Right wrist fracture, to see Dr. Amedeo Plenty today   Medication Adjustments/Labs and Tests Ordered: Current medicines are reviewed at length with the patient today.  Concerns regarding medicines are outlined above.  Medication changes, Labs and Tests ordered today are listed in the Patient Instructions below. Patient Instructions  Medication Instructions: STOP the Eliquis  * If you need a refill on your cardiac medications before your next appointment, please call your pharmacy.   Labwork: None ordered  Testing/Procedures: Your physician has recommended that you have a loop recorder implant. Please follow the instructions below, located under the special instructions section.  Follow-Up: Your physician recommends that you schedule a wound check appointment 10-14 days, after your procedure on 04/11/2019, with the device clinic.  Your physician recommends that you schedule a follow up appointment in 91 days, after your procedure on 04/11/2019, with Dr. Sallyanne Kuster.  Thank you for choosing [facility]!!          [facility], [address]  [place], [place] [address]  Phone: [phone] Fax: [phone]   You are scheduled for a Loop Recorder Implant on 04/11/2019 with Dr. Sallyanne Kuster.    You do not need to be fasting.    Please bring your insurance cards and a list of your medications with you.  Wash your chest and neck with the surgical soap provided the evening before and the morning of your procedure. Please following the washing instructions provided. You may pick the scrub up at the office or any drug store.    * Special note:  Every effort is made to have your procedure done  on time.  Occasionally there are emergencies that present themselves at the hospital that may cause delays.  Please be patient if a delay does occur.  Preparing for Surgery  Before surgery, you can play an important role. Because skin is not sterile, your skin needs to be as free of germs as possible. You can reduce the number of germs on your skin by washing with CHG (chlorhexidine gluconate) Soap before surgery. CHG is an antiseptic cleaner which kills germs and bonds with the skin to continue killing germs even after washing.  Please do not use if you have an allergy to CHG or antibacterial soaps. If your skin becomes reddened/irritated, STOP using the CHG.  DO NOT SHAVE (including legs and underarms) for at least 48 hours prior to first CHG shower. It is OK to shave your face.  Please follow these instructions carefully: 1. Shower the night before surgery and the morning of surgery with CHG Soap. 2. If you chose to wash your hair, wash your hair first as usual with your normal shampoo/conditioner. 3. After you shampoo/condition, rinse you hair and body thoroughly to remove shampoo/conditioner. 4. Use CHG as you would any other liquid soap. You can apply CHG directly to the skin and wash gently with a loofah or a clean washcloth. 5. Apply the CHG Soap to your body ONLY FROM THE NECK DOWN. Do not use on open wounds or open sores. Avoid contact with your eyes, ears, mouth, and genitals (private parts). Wash genitals (private part) with your normal soap. 6. Wash thoroughly, paying special attention to the area where your surgery will be performed. 7. Thoroughly rinse your body with warm water from the neck down. 8. DO NOT shower/wash with your normal soap after using and rinsing off the CHG Soap. 9. Pat yourself dry with a clean towel. 10. Wear clean pajamas to bed. 11. Place clean sheets on your bed the night of your first shower and do not sleep with pets..  Day of Surgery: Shower with the  CHG Soap following the instructions listed above. DO NOT apply deodorants or lotions. Please wear clean clothes to the hospital/surgery center.      Signed, Sanda Klein, MD  03/11/2019 5:05 PM    [facility] [place], [place], [place]  [address] Phone: [phone]; Fax: [phone]

## 2019-03-10 NOTE — Patient Instructions (Addendum)
Medication Instructions: STOP the Eliquis  * If you need a refill on your cardiac medications before your next appointment, please call your pharmacy.   Labwork: None ordered  Testing/Procedures: Your physician has recommended that you have a loop recorder implant. Please follow the instructions below, located under the special instructions section.  Follow-Up: Your physician recommends that you schedule a wound check appointment 10-14 days, after your procedure on 04/11/2019, with the device clinic.  Your physician recommends that you schedule a follow up appointment in 91 days, after your procedure on 04/11/2019, with Dr. Sallyanne Kuster.  Thank you for choosing [facility]!!          [facility], [place]  [place], [place] [address]  Phone: [phone] Fax: [phone]   You are scheduled for a Loop Recorder Implant on 04/11/2019 with Dr. Sallyanne Kuster.    You do not need to be fasting.    Please bring your insurance cards and a list of your medications with you.    Wash your chest and neck with the surgical soap provided the evening before and the morning of your procedure. Please following the washing instructions provided. You may pick the scrub up at the office or any drug store.    * Special note:  Every effort is made to have your procedure done on time.  Occasionally there are emergencies that present themselves at the hospital that may cause delays.  Please be patient if a delay does occur.  Preparing for Surgery  Before surgery, you can play an important role. Because skin is not sterile, your skin needs to be as free of germs as possible. You can reduce the number of germs on your skin by washing with CHG (chlorhexidine gluconate) Soap before surgery. CHG is an antiseptic cleaner which kills germs and bonds with the skin to continue killing germs even after washing.  Please do not use if you have an allergy to CHG  or antibacterial soaps. If your skin becomes reddened/irritated, STOP using the CHG.  DO NOT SHAVE (including legs and underarms) for at least 48 hours prior to first CHG shower. It is OK to shave your face.  Please follow these instructions carefully: 1. Shower the night before surgery and the morning of surgery with CHG Soap. 2. If you chose to wash your hair, wash your hair first as usual with your normal shampoo/conditioner. 3. After you shampoo/condition, rinse you hair and body thoroughly to remove shampoo/conditioner. 4. Use CHG as you would any other liquid soap. You can apply CHG directly to the skin and wash gently with a loofah or a clean washcloth. 5. Apply the CHG Soap to your body ONLY FROM THE NECK DOWN. Do not use on open wounds or open sores. Avoid contact with your eyes, ears, mouth, and genitals (private parts). Wash genitals (private part) with your normal soap. 6. Wash thoroughly, paying special attention to the area where your surgery will be performed. 7. Thoroughly rinse your body with warm water from the neck down. 8. DO NOT shower/wash with your normal soap after using and rinsing off the CHG Soap. 9. Pat yourself dry with a clean towel. 10. Wear clean pajamas to bed. 11. Place clean sheets on your bed the night of your first shower and do not sleep with pets..  Day of Surgery: Shower with the CHG Soap following the instructions listed above. DO NOT apply deodorants or lotions. Please wear clean clothes to the  hospital/surgery center.

## 2019-03-11 ENCOUNTER — Encounter: Payer: Self-pay | Admitting: Cardiovascular Disease

## 2019-03-11 DIAGNOSIS — M545 Low back pain: Secondary | ICD-10-CM | POA: Diagnosis not present

## 2019-03-11 DIAGNOSIS — M4856XA Collapsed vertebra, not elsewhere classified, lumbar region, initial encounter for fracture: Secondary | ICD-10-CM | POA: Diagnosis not present

## 2019-03-14 ENCOUNTER — Other Ambulatory Visit: Payer: Self-pay | Admitting: Family Medicine

## 2019-03-14 DIAGNOSIS — K7689 Other specified diseases of liver: Secondary | ICD-10-CM

## 2019-03-18 ENCOUNTER — Emergency Department (HOSPITAL_COMMUNITY)
Admission: EM | Admit: 2019-03-18 | Discharge: 2019-03-19 | Disposition: A | Discharge status: Home / Self Care | Payer: Medicare Other | Attending: Emergency Medicine | Admitting: Emergency Medicine

## 2019-03-18 DIAGNOSIS — Z87891 Personal history of nicotine dependence: Secondary | ICD-10-CM | POA: Insufficient documentation

## 2019-03-18 DIAGNOSIS — Z853 Personal history of malignant neoplasm of breast: Secondary | ICD-10-CM | POA: Insufficient documentation

## 2019-03-18 DIAGNOSIS — G459 Transient cerebral ischemic attack, unspecified: Secondary | ICD-10-CM | POA: Insufficient documentation

## 2019-03-18 DIAGNOSIS — Z923 Personal history of irradiation: Secondary | ICD-10-CM | POA: Diagnosis not present

## 2019-03-18 DIAGNOSIS — R4781 Slurred speech: Secondary | ICD-10-CM | POA: Diagnosis present

## 2019-03-18 DIAGNOSIS — I10 Essential (primary) hypertension: Secondary | ICD-10-CM | POA: Diagnosis not present

## 2019-03-18 DIAGNOSIS — I252 Old myocardial infarction: Secondary | ICD-10-CM | POA: Insufficient documentation

## 2019-03-18 DIAGNOSIS — Z79899 Other long term (current) drug therapy: Secondary | ICD-10-CM | POA: Diagnosis not present

## 2019-03-18 DIAGNOSIS — I2510 Atherosclerotic heart disease of native coronary artery without angina pectoris: Secondary | ICD-10-CM | POA: Diagnosis not present

## 2019-03-18 DIAGNOSIS — Z7901 Long term (current) use of anticoagulants: Secondary | ICD-10-CM | POA: Insufficient documentation

## 2019-03-18 DIAGNOSIS — R200 Anesthesia of skin: Secondary | ICD-10-CM | POA: Insufficient documentation

## 2019-03-18 DIAGNOSIS — R29810 Facial weakness: Secondary | ICD-10-CM | POA: Insufficient documentation

## 2019-03-18 DIAGNOSIS — R55 Syncope and collapse: Secondary | ICD-10-CM | POA: Diagnosis not present

## 2019-03-18 NOTE — ED Triage Notes (Signed)
BIB EMS. Pt was home with daughter and had sudden onset of slurred speech, facial droop and numbness in right hand. Symptoms lasted approximately 10 mins and were resolved prior to arrival to ER. Pt had a fall 2 weeks ago and broke right hand. Currently has no complaints

## 2019-03-18 NOTE — ED Notes (Signed)
Husband Herbie [place]) [phone]

## 2019-03-19 ENCOUNTER — Emergency Department (HOSPITAL_COMMUNITY): Payer: Medicare Other

## 2019-03-19 DIAGNOSIS — G459 Transient cerebral ischemic attack, unspecified: Secondary | ICD-10-CM | POA: Diagnosis not present

## 2019-03-19 LAB — COMPREHENSIVE METABOLIC PANEL
ALT: 16 U/L (ref 0–44)
AST: 18 U/L (ref 15–41)
Albumin: 3.4 g/dL — ABNORMAL LOW (ref 3.5–5.0)
Alkaline Phosphatase: 112 U/L (ref 38–126)
Anion gap: 12 (ref 5–15)
BUN: 10 mg/dL (ref 8–23)
CO2: 25 mmol/L (ref 22–32)
Calcium: 9.2 mg/dL (ref 8.9–10.3)
Chloride: 99 mmol/L (ref 98–111)
Creatinine, Ser: 0.78 mg/dL (ref 0.44–1.00)
GFR calc Af Amer: >60 mL/min (ref >60)
GFR calc non Af Amer: >60 mL/min (ref >60)
Glucose, Bld: 109 mg/dL — ABNORMAL HIGH (ref 70–99)
Potassium: 3.6 mmol/L (ref 3.5–5.1)
Sodium: 136 mmol/L (ref 135–145)
Total Bilirubin: 0.4 mg/dL (ref 0.3–1.2)
Total Protein: 6.9 g/dL (ref 6.5–8.1)

## 2019-03-19 LAB — URINALYSIS, ROUTINE W REFLEX MICROSCOPIC
Bacteria, UA: NONE SEEN
Bilirubin Urine: NEGATIVE
Glucose, UA: NEGATIVE mg/dL
Ketones, ur: 5 mg/dL — AB
Leukocytes,Ua: NEGATIVE
Nitrite: NEGATIVE
Protein, ur: NEGATIVE mg/dL
Specific Gravity, Urine: 1.015 (ref 1.005–1.030)
pH: 6 (ref 5.0–8.0)

## 2019-03-19 LAB — DIFFERENTIAL
Abs Immature Granulocytes: 0.02 10*3/uL (ref 0.00–0.07)
Basophils Absolute: 0 10*3/uL (ref 0.0–0.1)
Basophils Relative: 1 %
Eosinophils Absolute: 0.1 10*3/uL (ref 0.0–0.5)
Eosinophils Relative: 1 %
Immature Granulocytes: 0 %
Lymphocytes Relative: 24 %
Lymphs Abs: 2 10*3/uL (ref 0.7–4.0)
Monocytes Absolute: 0.6 10*3/uL (ref 0.1–1.0)
Monocytes Relative: 8 %
Neutro Abs: 5.6 10*3/uL (ref 1.7–7.7)
Neutrophils Relative %: 66 %

## 2019-03-19 LAB — I-STAT CHEM 8, ED
BUN: 11 mg/dL (ref 8–23)
Calcium, Ion: 1.2 mmol/L (ref 1.15–1.40)
Chloride: 98 mmol/L (ref 98–111)
Creatinine, Ser: 0.7 mg/dL (ref 0.44–1.00)
Glucose, Bld: 104 mg/dL — ABNORMAL HIGH (ref 70–99)
HCT: 38 % (ref 36.0–46.0)
Hemoglobin: 12.9 g/dL (ref 12.0–15.0)
Potassium: 3.6 mmol/L (ref 3.5–5.1)
Sodium: 136 mmol/L (ref 135–145)
TCO2: 30 mmol/L (ref 22–32)

## 2019-03-19 LAB — CBC
HCT: 38.8 % (ref 36.0–46.0)
Hemoglobin: 13 g/dL (ref 12.0–15.0)
MCH: 32.9 pg (ref 26.0–34.0)
MCHC: 33.5 g/dL (ref 30.0–36.0)
MCV: 98.2 fL (ref 80.0–100.0)
Platelets: 400 10*3/uL (ref 150–400)
RBC: 3.95 MIL/uL (ref 3.87–5.11)
RDW: 12 % (ref 11.5–15.5)
WBC: 8.3 10*3/uL (ref 4.0–10.5)
nRBC: 0 % (ref 0.0–0.2)

## 2019-03-19 LAB — RAPID URINE DRUG SCREEN, HOSP PERFORMED
Amphetamines: NOT DETECTED
Barbiturates: NOT DETECTED
Benzodiazepines: NOT DETECTED
Cocaine: NOT DETECTED
Opiates: POSITIVE — AB
Tetrahydrocannabinol: NOT DETECTED

## 2019-03-19 LAB — PROTIME-INR
INR: 1 (ref 0.8–1.2)
Prothrombin Time: 13 seconds (ref 11.4–15.2)

## 2019-03-19 LAB — APTT: aPTT: 34 seconds (ref 24–36)

## 2019-03-19 LAB — ETHANOL: Alcohol, Ethyl (B): <10 mg/dL (ref <10)

## 2019-03-19 MED ORDER — ACETAMINOPHEN 325 MG PO TABS
650.0000 mg | ORAL_TABLET | Freq: Once | ORAL | Status: AC
  Administered 2019-03-19: 650 mg via ORAL
  Filled 2019-03-19: qty 2

## 2019-03-19 MED ORDER — APIXABAN 5 MG PO TABS: 5.0000 mg | ORAL_TABLET | Freq: Two times a day (BID) | ORAL | 0 refills | Status: DC

## 2019-03-19 NOTE — ED Notes (Signed)
Melinda [name] husband [phone] looking for an update on the patient

## 2019-03-19 NOTE — ED Notes (Signed)
Patient verbalizes understanding of discharge instructions. Opportunity for questioning and answers were provided. Armband removed by staff, pt discharged from ED.  

## 2019-03-19 NOTE — Discharge Instructions (Addendum)
You were seen today after having a suspected TIA or mini stroke.  Because of your history of atrial fibrillation, you should restart your Eliquis given this event.  Your MRI is negative for stroke.  Follow-up closely with your primary doctor and cardiologist.  Being on Eliquis does put you at risk for trauma given your recent fall history.  It is important that you make sure that you stay hydrated.  Avoid excessive alcohol.

## 2019-03-19 NOTE — ED Provider Notes (Signed)
[facility] EMERGENCY DEPARTMENT Provider Note   CSN: [id number] Arrival date & time: 03/18/19  2308     History Chief Complaint  Patient presents with  . Weakness    Melinda [name] is a 84 y.o. female.  HPI     This is an 84 year old female with a history of paroxysmal atrial fibrillation, Takotsubo cardiomyopathy, hypertension, hyperlipidemia who presents with slurred speech, right facial numbness and right hand numbness.  Patient reports that she was sitting tonight with her family when she had acute onset of right hand numbness and tingling.  She also noted tingling and numbness to the right side of her face.  Per report, she may have had some facial droop.  Her daughter noted that she had slurred speech.  Symptoms lasted 10 to 20 minutes per patient.  She reports that they have completely resolved.  No history of stroke.  She does have a history of atrial fibrillation.  She has been off of her Eliquis for the last 2 weeks given recent syncopal episodes and falls.  Of note, she was admitted to the hospital on December 4 after 2 syncopal episodes.  She also sustained a fracture to the right hand.  She followed up with her cardiologist Dr. Loletha Grayer, who made the recommendation for her to come off of her Eliquis given her recurrent falls and risk for significant injury.  Patient also noted to drink alcohol not infrequently.  She reports tonight she had 1 alcoholic drink.  She is not had any recent fevers, cough, shortness of breath, chest pain, abdominal pain.  Chart reviewed.  Recent CT imaging but no MRI or advanced neurologic imaging.  She did have an echocardiogram that showed normal EF, moderate aortic regurg and aortic stenosis.  Past Medical History:  Diagnosis Date  . Allergy   . Anemia 1984   before hysterectomy  . Breast cancer ([place])    bilateral  . GERD (gastroesophageal reflux disease)   . History of blood product transfusion 1984  . Hyperlipidemia   .  Hypertension   . Macular degeneration    right eye  . Myocardial infarction ([place])   . Osteopenia   . Personal history of radiation therapy   . Takotsubo cardiomyopathy     Patient Active Problem List   Diagnosis Date Noted  . Syncope 03/04/2019  . Alcohol abuse 03/04/2019  . Fall at home, initial encounter 03/04/2019  . Vasovagal syncope 12/31/2017  . Long term current use of anticoagulant 11/26/2015  . Allergic rhinitis due to pollen 11/23/2015  . Asthmatic bronchitis 11/23/2015  . History of colon polyps 11/23/2015  . Other specified disorders of bone density and structure, other site 11/23/2015  . Glaucoma 11/23/2015  . Mood disorder ([place]) 11/23/2015  . Vitamin D deficiency 11/23/2015  . Paroxysmal atrial fibrillation ([place]) 12/08/2013  . Paroxysmal A-fib ([place]) 12/08/2013  . CAD (coronary artery disease) - 75% ostial OM1 10/21/2013  . Takotsubo cardiomyopathy   . NSTEMI (non-ST elevated myocardial infarction) ([place]) 07/25/2013  . Chest pain 09/09/2012  . GERD (gastroesophageal reflux disease) 09/09/2012  . Essential hypertension 09/09/2012  . Hypercholesterolemia 09/09/2012  . hx: breast cancer, left, invasive ductal carcinoma, right, DCIS 02/19/2011    Past Surgical History:  Procedure Laterality Date  . ABDOMINAL HYSTERECTOMY  1984  . APPENDECTOMY    . BREAST BIOPSY Right 04/29/05   right   . BREAST BIOPSY Left 2006  . BREAST LUMPECTOMY Left 04/19/04   left with sentinel node  .  BREAST LUMPECTOMY Right 2007  . CATARACT EXTRACTION     bilateral  . EYE SURGERY    . LEFT HEART CATHETERIZATION WITH CORONARY ANGIOGRAM N/A 07/26/2013   Procedure: LEFT HEART CATHETERIZATION WITH CORONARY ANGIOGRAM;  Surgeon: Jettie Booze, MD;  Location: [facility] CATH LAB;  Service: Cardiovascular;  Laterality: N/A;  . RETINAL LASER PROCEDURE     right     OB History   No obstetric history on file.     Family History  Problem Relation Age of Onset  . Dementia Mother   . Stroke  Mother   . Heart disease Mother 39  . Cancer Father        Prostate  . Heart disease Father 110  . Cancer Maternal Aunt        Breast  . Breast cancer Maternal Aunt   . Cancer Maternal Grandmother   . Breast cancer Maternal Grandmother   . Heart attack Maternal Grandfather   . Heart attack Paternal Grandfather     Social History   Tobacco Use  . Smoking status: Former Smoker    Quit date: 1991    Years since quitting: 29.9  . Smokeless tobacco: Never Used  Substance Use Topics  . Alcohol use: Yes    Alcohol/week: 3.0 standard drinks    Types: 3 Shots of liquor per week    Comment: 3 drinks per night, "too much"  . Drug use: No    Home Medications Prior to Admission medications   Medication Sig Start Date End Date Taking? Authorizing Provider  acetaminophen (TYLENOL) 500 MG tablet Take 1,000 mg by mouth at bedtime as needed (for pain, headaches, or fever).     [provider]  albuterol (PROAIR HFA) 108 (90 Base) MCG/ACT inhaler Inhale 1-2 puffs into the lungs every 6 (six) hours as needed for wheezing or shortness of breath.    [provider]  apixaban (ELIQUIS) 5 MG TABS tablet Take 1 tablet (5 mg total) by mouth 2 (two) times daily. 03/19/19 04/18/19  Teague Goynes, Barbette Hair, MD  atorvastatin (LIPITOR) 20 MG tablet Take 20 mg by mouth at bedtime.     [provider]  cholecalciferol (VITAMIN D) 1000 units tablet Take 1,000 Units by mouth daily.    [provider]  fexofenadine (ALLEGRA) 180 MG tablet Take 180 mg by mouth daily.     [provider]  latanoprost (XALATAN) 0.005 % ophthalmic solution Place 1 drop into both eyes at bedtime.  06/17/13   [provider]  losartan (COZAAR) 50 MG tablet Take 50 mg by mouth daily. 11/10/16   [provider]  metoprolol succinate (TOPROL-XL) 25 MG 24 hr tablet Take 1 tablet (25 mg total) by mouth daily. Patient taking differently: Take 12.5 mg by mouth daily.  01/08/18   [facility],  Tami Lin, PA  nitroGLYCERIN (NITROSTAT) 0.4 MG SL tablet Place 1 tablet (0.4 mg total) under the tongue every 5 (five) minutes as needed for chest pain. 12/22/16   Croitoru, Mihai, MD  sertraline (ZOLOFT) 25 MG tablet Take 25 mg by mouth daily as needed for anxiety. 10/05/17   [provider]  timolol (TIMOPTIC) 0.5 % ophthalmic solution Place 1 drop into the right eye every morning. 01/31/19   [provider]    Allergies    Ibuprofen, Nsaids, Salicylates, Codeine, Etodolac, and Hydrochlorothiazide  Review of Systems   Review of Systems  Constitutional: Negative for fever.  Respiratory: Negative for shortness of breath.   Cardiovascular:  Negative for chest pain.  Gastrointestinal: Negative for abdominal pain, nausea and vomiting.  Genitourinary: Negative for dysuria.  Neurological: Positive for facial asymmetry, speech difficulty and numbness. Negative for dizziness and weakness.  Psychiatric/Behavioral: Negative for confusion.  All other systems reviewed and are negative.   Physical Exam Updated Vital Signs BP (!) 154/70   Pulse 83   Temp 98.6 F (37 C) (Oral)   Resp 16   SpO2 98%   Physical Exam Vitals and nursing note reviewed.  Constitutional:      Appearance: She is well-developed. She is not ill-appearing.  HENT:     Head: Normocephalic and atraumatic.     Nose: Nose normal.  Eyes:     Pupils: Pupils are equal, round, and reactive to light.  Cardiovascular:     Rate and Rhythm: Normal rate and regular rhythm.     Heart sounds: Murmur present.  Pulmonary:     Effort: Pulmonary effort is normal. No respiratory distress.     Breath sounds: No wheezing.  Abdominal:     General: Bowel sounds are normal.     Palpations: Abdomen is soft.     Tenderness: There is no abdominal tenderness.  Musculoskeletal:     Cervical back: Neck supple.     Right lower leg: No edema.     Left lower leg: No edema.  Skin:    General: Skin is warm and dry.    Neurological:     Mental Status: She is alert and oriented to person, place, and time.     Comments: Fluent speech, cranial nerves II through XII intact, 5 out of 5 strength in all 4 extremities, no dysmetria to finger-nose-finger, gait testing deferred  Psychiatric:        Mood and Affect: Mood normal.     ED Results / Procedures / Treatments   Labs (all labs ordered are listed, but only abnormal results are displayed) Labs Reviewed  COMPREHENSIVE METABOLIC PANEL - Abnormal; Notable for the following components:      Result Value   Glucose, Bld 109 (*)    Albumin 3.4 (*)    All other components within normal limits  RAPID URINE DRUG SCREEN, HOSP PERFORMED - Abnormal; Notable for the following components:   Opiates POSITIVE (*)    All other components within normal limits  URINALYSIS, ROUTINE W REFLEX MICROSCOPIC - Abnormal; Notable for the following components:   Hgb urine dipstick SMALL (*)    Ketones, ur 5 (*)    All other components within normal limits  I-STAT CHEM 8, ED - Abnormal; Notable for the following components:   Glucose, Bld 104 (*)    All other components within normal limits  ETHANOL  PROTIME-INR  APTT  CBC  DIFFERENTIAL    EKG EKG Interpretation  Date/Time:  Saturday March 19 2019 00:14:06 EST Ventricular Rate:  84 PR Interval:    QRS Duration: 91 QT Interval:  383 QTC Calculation: 453 R Axis:   -22 Text Interpretation: Sinus rhythm Probable left atrial enlargement Borderline left axis deviation Anteroseptal infarct, age indeterminate Confirmed by Thayer Jew [id number]) on 03/19/2019 1:42:56 AM   Radiology CT HEAD WO CONTRAST  Result Date: 03/19/2019 CLINICAL DATA:  Transient ischemic attack. Sudden onset of slurred speech and facial droop. Right hand numbness. Symptoms loss at 10 minutes, resolved. EXAM: CT HEAD WITHOUT CONTRAST TECHNIQUE: Contiguous axial images were obtained from the base of the skull through the vertex without  intravenous contrast. COMPARISON:  Head CT 03/04/2019  FINDINGS: Brain: No intracranial hemorrhage, mass effect, or midline shift. No evidence of territorial infarct or acute ischemia. Stable degree of atrophy and chronic small vessel ischemia. No hydrocephalus. The basilar cisterns are patent. No extra-axial or intracranial fluid collection. Vascular: Atherosclerosis of skullbase vasculature without hyperdense vessel or abnormal calcification. Skull: No fracture or focal lesion. Sinuses/Orbits: Paranasal sinuses and mastoid air cells are clear. The visualized orbits are unremarkable. Bilateral cataract resection. Other: None. IMPRESSION: No acute intracranial abnormality. Stable degree of atrophy and chronic small vessel ischemia. Electronically Signed   By: Keith Rake M.D.   On: 03/19/2019 00:21    Procedures Procedures (including critical care time)  CRITICAL CARE Performed by: Merryl Hacker   Total critical care time: 31 minutes  Critical care time was exclusive of separately billable procedures and treating other patients.  Critical care was necessary to treat or prevent imminent or life-threatening deterioration.  Critical care was time spent personally by me on the following activities: development of treatment plan with patient and/or surrogate as well as nursing, discussions with consultants, evaluation of patient's response to treatment, examination of patient, obtaining history from patient or surrogate, ordering and performing treatments and interventions, ordering and review of laboratory studies, ordering and review of radiographic studies, pulse oximetry and re-evaluation of patient's condition.   Medications Ordered in ED Medications  acetaminophen (TYLENOL) tablet 650 mg (650 mg Oral Given 03/19/19 0346)    ED Course  I have reviewed the triage vital signs and the nursing notes.  Pertinent labs & imaging results that were available during my care of the patient  were reviewed by me and considered in my medical decision making (see chart for details).  Clinical Course as of Mar 18 404  Sat Mar 19, 2019  0017 Spoke with Dr. Rory Percy, neurology.  He agrees that there needs to be a risk-benefit discussion regarding anticoagulation.  Given concern for TIA and high chads vasc score of at least 4, anticoagulation would be recommended.  Will obtain an MRI.  If negative, will recommend reinitiating Eliquis.   [CH]    Clinical Course User Index [CH] Breyon Blass, Barbette Hair, MD   MDM Rules/Calculators/A&P                      Patient presents after what sounds highly suspicious for TIA.  This is especially concerning given recent discontinuation of Eliquis.  She is overall nontoxic-appearing and currently her neurologic exam is normal.  She is in normal sinus rhythm.  However, this does not preclude her from being at risk given her history of atrial fibrillation.  See communication with neurology above.  Stroke work-up initiated including lab work.  Lab work reviewed by myself and largely reassuring.  Alcohol level is less than 10.  No significant anabolic derangements.  MRI was obtained and does not show any evidence of embolic phenomenon.  This is very reassuring.  I had a long discussion with the patient regarding the risk and benefits of reinitiating her Eliquis.  Given this recent event which is concerning for TIA, this puts her at higher risk for stroke.  She understands that given her history of syncope and falls, she is also at risk from an anticoagulation standpoint.  Plan for reinitiation of Eliquis and close follow-up with both her cardiologist and neurology as an outpatient.  After history, exam, and medical workup I feel the patient has been appropriately medically screened and is safe for discharge home. Pertinent diagnoses were  discussed with the patient. Patient was given return precautions.    Final Clinical Impression(s) / ED Diagnoses Final diagnoses:   TIA (transient ischemic attack)    Rx / DC Orders ED Discharge Orders         Ordered    apixaban (ELIQUIS) 5 MG TABS tablet  2 times daily     03/19/19 0404           Starsky Nanna, Barbette Hair, MD 03/19/19 [phone]

## 2019-03-22 ENCOUNTER — Telehealth: Payer: Self-pay | Admitting: Cardiovascular Disease

## 2019-03-22 MED ORDER — METOPROLOL SUCCINATE ER 25 MG PO TB24: 25.0000 mg | ORAL_TABLET | Freq: Every day | ORAL | 3 refills | Status: DC

## 2019-03-22 NOTE — Telephone Encounter (Signed)
The patient has been made aware to stay on the Eliquis 5 mg twice daily and to take the Metoprolol Succinate 25 mg once daily. Metoprolol has been sent into her pharmacy per her request.

## 2019-03-22 NOTE — Telephone Encounter (Signed)
Spoke to patient's husband.He stated wife had another TIA last night.Stated her mouth drooped and numbness in left hand.B/P elevated 189/80.Stated a Therapist, sports from [employer] checked her.The episode lasted appox 1 hour.B/P came down to 160/80.Stated she feels better this morning.She just woke up he has not checked her B/P.Stated when she went to ED last Fri.ED Dr.told her to take Eliquis 5 mg twice a day.Stated she has been taking Metoprolol 25 mg 1/2 tablet daily,last night she took the other half 12.5 mg.He wanted to let Dr.Croitoru know and ask him if she needs to continue Metoprolol 25 mg daily.Advised I will send message to Dr.Croitoru.

## 2019-03-22 NOTE — Telephone Encounter (Signed)
I would recommend continuing both the Eliquis and the metoprolol 25 mg daily.

## 2019-03-22 NOTE — Telephone Encounter (Signed)
  Husband calling because patient had a TIA on 03/18/19 and last night she had a minor one. He would like to discuss her metoprolol succinate (TOPROL-XL) 25 MG 24 hr tablet and apixaban (ELIQUIS) 5 MG TABS tablet with the doctor to discuss if it needs to be adjusted.

## 2019-03-28 ENCOUNTER — Other Ambulatory Visit: Payer: Self-pay

## 2019-03-28 ENCOUNTER — Other Ambulatory Visit: Payer: Self-pay | Admitting: Orthopedic Surgery

## 2019-03-28 ENCOUNTER — Ambulatory Visit
Admission: RE | Admit: 2019-03-28 | Discharge: 2019-03-28 | Disposition: A | Discharge status: Home / Self Care | Payer: Self-pay | Source: Ambulatory Visit | Attending: Orthopedic Surgery | Admitting: Orthopedic Surgery

## 2019-03-28 DIAGNOSIS — M545 Low back pain, unspecified: Secondary | ICD-10-CM

## 2019-03-29 ENCOUNTER — Ambulatory Visit
Admission: RE | Admit: 2019-03-29 | Discharge: 2019-03-29 | Disposition: A | Discharge status: Home / Self Care | Payer: Medicare Other | Source: Ambulatory Visit | Attending: Family Medicine | Admitting: Family Medicine

## 2019-03-29 DIAGNOSIS — K7689 Other specified diseases of liver: Secondary | ICD-10-CM

## 2019-04-07 DIAGNOSIS — M25531 Pain in right wrist: Secondary | ICD-10-CM | POA: Diagnosis not present

## 2019-04-08 DIAGNOSIS — M4856XD Collapsed vertebra, not elsewhere classified, lumbar region, subsequent encounter for fracture with routine healing: Secondary | ICD-10-CM | POA: Diagnosis not present

## 2019-04-11 ENCOUNTER — Other Ambulatory Visit: Payer: Self-pay

## 2019-04-11 ENCOUNTER — Ambulatory Visit (INDEPENDENT_AMBULATORY_CARE_PROVIDER_SITE_OTHER): Payer: Medicare Other | Admitting: Cardiovascular Disease

## 2019-04-11 DIAGNOSIS — R55 Syncope and collapse: Secondary | ICD-10-CM

## 2019-04-11 MED ORDER — LIDOCAINE-EPINEPHRINE 1 %-1:100000 IJ SOLN: 10.0000 mL | Freq: Once | INTRAMUSCULAR | Status: AC

## 2019-04-11 NOTE — Patient Instructions (Addendum)
Discharge Instructions for  Loop Recorder Implant    ACTIVITY No restrictions. DO wear your seatbelt, even if it crosses over the site.   WOUND CARE  Keep the wound area clean and dry.  Remove the dressing the day after (usually 24 hours after the procedure).  DO NOT SUBMERGE UNDER WATER UNTIL FULLY HEALED (no tub baths, hot tubs, swimming pools, etc.).   You  may shower or take a sponge bath after the dressing is removed. DO NOT SOAK the area and do not allow the shower to directly spray on the site.  If you have tape/steri-strips on your wound, these will fall off; do not pull them off prematurely.    No bandage is needed on the site.  DO  NOT apply any creams, oils, or ointments to the wound area.  If you notice any drainage or discharge from the wound, any swelling, excessive redness or bruising at the site, or if you develop a fever > 101? F, call the office at once at [phone].   SPECIAL INSTRUCTIONS  You are still able to use cellular telephones.  Avoid carrying your cellular phone near your device.  When traveling through airports, show security personnel your identification card to avoid being screened in the metal detectors.   Avoid arc welding equipment, MRI testing (magnetic resonance imaging), TENS units (transcutaneous nerve stimulators).  Call the office for questions about other devices.  Avoid electrical appliances that are in poor condition or are not properly grounded.  Microwave ovens are safe to be near or to operate.   Follow up:   You have an appointment for a wound check at 2 pm on 04/19/2019  You will need a 3 month appointment in office with Dr. Sallyanne Kuster.

## 2019-04-11 NOTE — Addendum Note (Signed)
Addended by: Ricci Barker on: 04/11/2019 03:59 PM   Modules accepted: Orders

## 2019-04-11 NOTE — Progress Notes (Addendum)
LOOP RECORDER IMPLANT    Reason for procedure:  Recurrent syncope/near-syncope  Procedure performed by:  Sanda Klein, MD  Complications:  None  Estimated blood loss:  <5 mL  Medications administered during procedure:  Lidocaine 1% with 1/10,000 epinephrine 10 mL locally Device details:  Medtronic Reveal Linq II model number [id number], serial number [id number] Procedure details:  After the risks and benefits of the procedure were discussed the patient provided informed consent. The patient was prepped and draped in usual sterile fashion. Local anesthesia was administered to an area 2 cm to the left of the sternum in the 4th intercostal space. A cutaneous incision was made using the incision tool. The introducer was then used to create a subcutaneous tunnel and carefully deploy the device. Local pressure was held to ensure hemostasis.  The incision was closed with SteriStrips and a sterile dressing was applied.  R waves 0.99 mV.   Patient Instructions  Discharge Instructions for  Loop Recorder Implant    ACTIVITY No restrictions. DO wear your seatbelt, even if it crosses over the site.   WOUND CARE  Keep the wound area clean and dry.  Remove the dressing the day after (usually 24 hours after the procedure).  DO NOT SUBMERGE UNDER WATER UNTIL FULLY HEALED (no tub baths, hot tubs, swimming pools, etc.).   You  may shower or take a sponge bath after the dressing is removed. DO NOT SOAK the area and do not allow the shower to directly spray on the site.  If you have tape/steri-strips on your wound, these will fall off; do not pull them off prematurely.    No bandage is needed on the site.  DO  NOT apply any creams, oils, or ointments to the wound area.  If you notice any drainage or discharge from the wound, any swelling, excessive redness or bruising at the site, or if you develop a fever > 101? F, call the office at once at [phone].   SPECIAL INSTRUCTIONS  You are still  able to use cellular telephones.  Avoid carrying your cellular phone near your device.  When traveling through airports, show security personnel your identification card to avoid being screened in the metal detectors.   Avoid arc welding equipment, MRI testing (magnetic resonance imaging), TENS units (transcutaneous nerve stimulators).  Call the office for questions about other devices.  Avoid electrical appliances that are in poor condition or are not properly grounded.  Microwave ovens are safe to be near or to operate.   Follow up:   You have an appointment for a wound check at 2 pm on 04/19/2019           Signed, Sanda Klein, MD  04/11/2019 12:38 PM    [address], [place], [place]  [address] Phone: [phone]; Fax: [phone]

## 2019-04-14 ENCOUNTER — Telehealth: Payer: Self-pay | Admitting: Cardiovascular Disease

## 2019-04-14 NOTE — Telephone Encounter (Signed)
The pt states last night she slept on her left side. The pt states she notice a lot of blood under her steri strips. I asked her was she still bleeding. She denies any bleeding right now. I told her I will have the nurse to give her a call back.

## 2019-04-14 NOTE — Telephone Encounter (Signed)
New message:      Patient calling concering her device. Please call patient back.

## 2019-04-14 NOTE — Telephone Encounter (Signed)
Returned call to patient. Pt covered steri-strips with tissue to see if site is still oozing--no additional drainage noted on tissue throughout the day. Advised if oozing resumes, hold pressure at site for 35min, then monitor. If steri-strips fall off or if she has any additional concerns, encouraged to call our office back. Pt verbalizes understanding and denies questions at this time.

## 2019-04-18 DIAGNOSIS — H00024 Hordeolum internum left upper eyelid: Secondary | ICD-10-CM | POA: Diagnosis not present

## 2019-04-18 DIAGNOSIS — H401131 Primary open-angle glaucoma, bilateral, mild stage: Secondary | ICD-10-CM | POA: Diagnosis not present

## 2019-04-18 DIAGNOSIS — H40051 Ocular hypertension, right eye: Secondary | ICD-10-CM | POA: Diagnosis not present

## 2019-04-19 ENCOUNTER — Ambulatory Visit (INDEPENDENT_AMBULATORY_CARE_PROVIDER_SITE_OTHER): Payer: Medicare Other | Admitting: *Deleted

## 2019-04-19 ENCOUNTER — Other Ambulatory Visit: Payer: Self-pay

## 2019-04-19 DIAGNOSIS — Z95818 Presence of other cardiac implants and grafts: Secondary | ICD-10-CM

## 2019-04-19 DIAGNOSIS — R55 Syncope and collapse: Secondary | ICD-10-CM

## 2019-04-21 DIAGNOSIS — H353132 Nonexudative age-related macular degeneration, bilateral, intermediate dry stage: Secondary | ICD-10-CM | POA: Diagnosis not present

## 2019-04-21 DIAGNOSIS — H35363 Drusen (degenerative) of macula, bilateral: Secondary | ICD-10-CM | POA: Diagnosis not present

## 2019-04-21 DIAGNOSIS — H353212 Exudative age-related macular degeneration, right eye, with inactive choroidal neovascularization: Secondary | ICD-10-CM | POA: Diagnosis not present

## 2019-04-21 DIAGNOSIS — Z9889 Other specified postprocedural states: Secondary | ICD-10-CM | POA: Diagnosis not present

## 2019-04-21 LAB — CUP PACEART INCLINIC DEVICE CHECK
Date Time Interrogation Session: 20210119182603
Implantable Pulse Generator Implant Date: 20210111

## 2019-04-21 NOTE — Progress Notes (Signed)
ILR wound check in clinic. Steri strips removed. Wound well healed. Home monitor transmitting nightly. No episodes. Patient educated about wound care, Carelink monitor, and symptom activator. ROV with Dr. Sallyanne Kuster on 07/13/19.

## 2019-05-06 DIAGNOSIS — M4856XD Collapsed vertebra, not elsewhere classified, lumbar region, subsequent encounter for fracture with routine healing: Secondary | ICD-10-CM | POA: Diagnosis not present

## 2019-05-06 DIAGNOSIS — M4310 Spondylolisthesis, site unspecified: Secondary | ICD-10-CM | POA: Diagnosis not present

## 2019-05-06 DIAGNOSIS — I2510 Atherosclerotic heart disease of native coronary artery without angina pectoris: Secondary | ICD-10-CM | POA: Diagnosis not present

## 2019-05-11 DIAGNOSIS — S32030D Wedge compression fracture of third lumbar vertebra, subsequent encounter for fracture with routine healing: Secondary | ICD-10-CM | POA: Diagnosis not present

## 2019-05-11 DIAGNOSIS — R2681 Unsteadiness on feet: Secondary | ICD-10-CM | POA: Diagnosis not present

## 2019-05-11 DIAGNOSIS — M6281 Muscle weakness (generalized): Secondary | ICD-10-CM | POA: Diagnosis not present

## 2019-05-13 ENCOUNTER — Ambulatory Visit (INDEPENDENT_AMBULATORY_CARE_PROVIDER_SITE_OTHER): Payer: Medicare Other | Admitting: *Deleted

## 2019-05-13 DIAGNOSIS — R55 Syncope and collapse: Secondary | ICD-10-CM

## 2019-05-14 LAB — CUP PACEART REMOTE DEVICE CHECK
Date Time Interrogation Session: 20210212194100
Implantable Pulse Generator Implant Date: 20210111

## 2019-05-16 DIAGNOSIS — S32030D Wedge compression fracture of third lumbar vertebra, subsequent encounter for fracture with routine healing: Secondary | ICD-10-CM | POA: Diagnosis not present

## 2019-05-16 DIAGNOSIS — R2681 Unsteadiness on feet: Secondary | ICD-10-CM | POA: Diagnosis not present

## 2019-05-16 DIAGNOSIS — M6281 Muscle weakness (generalized): Secondary | ICD-10-CM | POA: Diagnosis not present

## 2019-05-18 DIAGNOSIS — S32030D Wedge compression fracture of third lumbar vertebra, subsequent encounter for fracture with routine healing: Secondary | ICD-10-CM | POA: Diagnosis not present

## 2019-05-18 DIAGNOSIS — M6281 Muscle weakness (generalized): Secondary | ICD-10-CM | POA: Diagnosis not present

## 2019-05-18 DIAGNOSIS — R2681 Unsteadiness on feet: Secondary | ICD-10-CM | POA: Diagnosis not present

## 2019-05-23 DIAGNOSIS — S32030D Wedge compression fracture of third lumbar vertebra, subsequent encounter for fracture with routine healing: Secondary | ICD-10-CM | POA: Diagnosis not present

## 2019-05-23 DIAGNOSIS — M6281 Muscle weakness (generalized): Secondary | ICD-10-CM | POA: Diagnosis not present

## 2019-05-23 DIAGNOSIS — R2681 Unsteadiness on feet: Secondary | ICD-10-CM | POA: Diagnosis not present

## 2019-05-25 DIAGNOSIS — M6281 Muscle weakness (generalized): Secondary | ICD-10-CM | POA: Diagnosis not present

## 2019-05-25 DIAGNOSIS — R2681 Unsteadiness on feet: Secondary | ICD-10-CM | POA: Diagnosis not present

## 2019-05-25 DIAGNOSIS — S32030D Wedge compression fracture of third lumbar vertebra, subsequent encounter for fracture with routine healing: Secondary | ICD-10-CM | POA: Diagnosis not present

## 2019-05-30 DIAGNOSIS — M6281 Muscle weakness (generalized): Secondary | ICD-10-CM | POA: Diagnosis not present

## 2019-05-30 DIAGNOSIS — S32030D Wedge compression fracture of third lumbar vertebra, subsequent encounter for fracture with routine healing: Secondary | ICD-10-CM | POA: Diagnosis not present

## 2019-05-30 DIAGNOSIS — R2681 Unsteadiness on feet: Secondary | ICD-10-CM | POA: Diagnosis not present

## 2019-06-01 DIAGNOSIS — S32030D Wedge compression fracture of third lumbar vertebra, subsequent encounter for fracture with routine healing: Secondary | ICD-10-CM | POA: Diagnosis not present

## 2019-06-01 DIAGNOSIS — R2681 Unsteadiness on feet: Secondary | ICD-10-CM | POA: Diagnosis not present

## 2019-06-01 DIAGNOSIS — M6281 Muscle weakness (generalized): Secondary | ICD-10-CM | POA: Diagnosis not present

## 2019-06-04 ENCOUNTER — Other Ambulatory Visit: Payer: Self-pay | Admitting: Cardiovascular Disease

## 2019-06-06 DIAGNOSIS — R2681 Unsteadiness on feet: Secondary | ICD-10-CM | POA: Diagnosis not present

## 2019-06-06 DIAGNOSIS — S32030D Wedge compression fracture of third lumbar vertebra, subsequent encounter for fracture with routine healing: Secondary | ICD-10-CM | POA: Diagnosis not present

## 2019-06-06 DIAGNOSIS — M6281 Muscle weakness (generalized): Secondary | ICD-10-CM | POA: Diagnosis not present

## 2019-06-08 DIAGNOSIS — S32030D Wedge compression fracture of third lumbar vertebra, subsequent encounter for fracture with routine healing: Secondary | ICD-10-CM | POA: Diagnosis not present

## 2019-06-08 DIAGNOSIS — M6281 Muscle weakness (generalized): Secondary | ICD-10-CM | POA: Diagnosis not present

## 2019-06-08 DIAGNOSIS — R2681 Unsteadiness on feet: Secondary | ICD-10-CM | POA: Diagnosis not present

## 2019-06-13 ENCOUNTER — Ambulatory Visit (INDEPENDENT_AMBULATORY_CARE_PROVIDER_SITE_OTHER): Payer: Medicare Other | Admitting: *Deleted

## 2019-06-13 DIAGNOSIS — M8000XD Age-related osteoporosis with current pathological fracture, unspecified site, subsequent encounter for fracture with routine healing: Secondary | ICD-10-CM | POA: Diagnosis not present

## 2019-06-13 DIAGNOSIS — I119 Hypertensive heart disease without heart failure: Secondary | ICD-10-CM | POA: Diagnosis not present

## 2019-06-13 DIAGNOSIS — E669 Obesity, unspecified: Secondary | ICD-10-CM | POA: Diagnosis not present

## 2019-06-13 DIAGNOSIS — M818 Other osteoporosis without current pathological fracture: Secondary | ICD-10-CM | POA: Diagnosis not present

## 2019-06-13 DIAGNOSIS — I429 Cardiomyopathy, unspecified: Secondary | ICD-10-CM | POA: Diagnosis not present

## 2019-06-13 DIAGNOSIS — R7303 Prediabetes: Secondary | ICD-10-CM | POA: Diagnosis not present

## 2019-06-13 DIAGNOSIS — I2510 Atherosclerotic heart disease of native coronary artery without angina pectoris: Secondary | ICD-10-CM | POA: Diagnosis not present

## 2019-06-13 DIAGNOSIS — R55 Syncope and collapse: Secondary | ICD-10-CM | POA: Diagnosis not present

## 2019-06-13 DIAGNOSIS — E7800 Pure hypercholesterolemia, unspecified: Secondary | ICD-10-CM | POA: Diagnosis not present

## 2019-06-13 LAB — CUP PACEART REMOTE DEVICE CHECK
Date Time Interrogation Session: 20210315194442
Implantable Pulse Generator Implant Date: 20210111

## 2019-06-14 NOTE — Progress Notes (Signed)
ILR Remote 

## 2019-07-13 ENCOUNTER — Other Ambulatory Visit: Payer: Self-pay

## 2019-07-13 ENCOUNTER — Encounter: Payer: Self-pay | Admitting: Cardiovascular Disease

## 2019-07-13 ENCOUNTER — Ambulatory Visit (INDEPENDENT_AMBULATORY_CARE_PROVIDER_SITE_OTHER): Payer: Medicare Other | Admitting: Cardiovascular Disease

## 2019-07-13 VITALS — BP 142/64 | HR 64 | Temp 97.0°F | Ht 62.0 in | Wt 165.0 lb

## 2019-07-13 DIAGNOSIS — Z95818 Presence of other cardiac implants and grafts: Secondary | ICD-10-CM

## 2019-07-13 DIAGNOSIS — I10 Essential (primary) hypertension: Secondary | ICD-10-CM

## 2019-07-13 DIAGNOSIS — I480 Paroxysmal atrial fibrillation: Secondary | ICD-10-CM | POA: Diagnosis not present

## 2019-07-13 DIAGNOSIS — G459 Transient cerebral ischemic attack, unspecified: Secondary | ICD-10-CM

## 2019-07-13 DIAGNOSIS — Z7901 Long term (current) use of anticoagulants: Secondary | ICD-10-CM | POA: Diagnosis not present

## 2019-07-13 DIAGNOSIS — E7800 Pure hypercholesterolemia, unspecified: Secondary | ICD-10-CM

## 2019-07-13 DIAGNOSIS — R55 Syncope and collapse: Secondary | ICD-10-CM | POA: Diagnosis not present

## 2019-07-13 DIAGNOSIS — I2510 Atherosclerotic heart disease of native coronary artery without angina pectoris: Secondary | ICD-10-CM | POA: Diagnosis not present

## 2019-07-13 MED ORDER — NITROGLYCERIN 0.4 MG SL SUBL: 0.4000 mg | SUBLINGUAL_TABLET | SUBLINGUAL | 3 refills | Status: DC | PRN

## 2019-07-13 NOTE — Progress Notes (Signed)
Cardiology Office Note    Date:  07/15/2019   ID:  Melinda [name], Melinda [name] Nov 12, 1934, MRN [id number]  PCP:  [name], MD  Cardiologist:   Sanda Klein, MD   Chief Complaint  Patient presents with  . Loss of Consciousness    History of Present Illness:  Melinda [name] is a 85 y.o. female with recent recurrent syncope leading to implantable loop recorder, history of paroxysmal atrial fibrillation, takotsubo cardiomyopathy with complete resolution, minor coronary artery disease with 75% stenosis of a first oblique marginal artery (asymptomatic). Last echo in October 2019 showed normal LVEF 55-60%.  She lives at [place].  She has done well without complaints of near syncope or syncope or other cardiovascular complaints.  She specifically denies chest pain or shortness of breath at rest or with activity, palpitations, leg edema or intermittent claudication.  She does not have orthostatic dizziness.  She has had some complaints of numbness in her right arm and right jaw.  She activated her loop recorder for these complaints but other than rare PVCs there were no significant abnormalities.  She also has problems with her "nerves in her lumbar spine".  Recent testing showed satisfactory lipid profile parameters and normal routine labs.   Past Medical History:  Diagnosis Date  . Allergy   . Anemia 1984   before hysterectomy  . Breast cancer ([place])    bilateral  . GERD (gastroesophageal reflux disease)   . History of blood product transfusion 1984  . Hyperlipidemia   . Hypertension   . Macular degeneration    right eye  . Myocardial infarction ([place])   . Osteopenia   . Personal history of radiation therapy   . Takotsubo cardiomyopathy     Past Surgical History:  Procedure Laterality Date  . ABDOMINAL HYSTERECTOMY  1984  . APPENDECTOMY    . BREAST BIOPSY Right 04/29/05   right   . BREAST BIOPSY Left 2006  . BREAST LUMPECTOMY Left 04/19/04   left with sentinel  node  . BREAST LUMPECTOMY Right 2007  . CATARACT EXTRACTION     bilateral  . EYE SURGERY    . LEFT HEART CATHETERIZATION WITH CORONARY ANGIOGRAM N/A 07/26/2013   Procedure: LEFT HEART CATHETERIZATION WITH CORONARY ANGIOGRAM;  Surgeon: Jettie Booze, MD;  Location: [facility] CATH LAB;  Service: Cardiovascular;  Laterality: N/A;  . RETINAL LASER PROCEDURE     right    Current Medications: Outpatient Medications Prior to Visit  Medication Sig Dispense Refill  . acetaminophen (TYLENOL) 500 MG tablet Take 1,000 mg by mouth at bedtime as needed (for pain, headaches, or fever).     Marland Kitchen albuterol (PROAIR HFA) 108 (90 Base) MCG/ACT inhaler Inhale 1-2 puffs into the lungs every 6 (six) hours as needed for wheezing or shortness of breath.    Marland Kitchen atorvastatin (LIPITOR) 20 MG tablet Take 20 mg by mouth at bedtime.     . cholecalciferol (VITAMIN D) 1000 units tablet Take 1,000 Units by mouth daily.    Marland Kitchen ELIQUIS 5 MG TABS tablet TAKE ONE TABLET BY MOUTH TWICE A DAY 180 tablet 0  . fexofenadine (ALLEGRA) 180 MG tablet Take 180 mg by mouth daily.     Marland Kitchen latanoprost (XALATAN) 0.005 % ophthalmic solution Place 1 drop into both eyes at bedtime.     Marland Kitchen losartan (COZAAR) 50 MG tablet Take 50 mg by mouth daily.    . metoprolol succinate (TOPROL-XL) 25 MG 24 hr tablet Take 1 tablet (25 mg total) by  mouth daily. 90 tablet 3  . sertraline (ZOLOFT) 25 MG tablet Take 25 mg by mouth daily as needed for anxiety.    . timolol (TIMOPTIC) 0.5 % ophthalmic solution Place 1 drop into the right eye every morning.    . nitroGLYCERIN (NITROSTAT) 0.4 MG SL tablet Place 1 tablet (0.4 mg total) under the tongue every 5 (five) minutes as needed for chest pain. 25 tablet 3   Facility-Administered Medications Prior to Visit  Medication Dose Route Frequency Provider Last Rate Last Admin  . lidocaine-EPINEPHrine (XYLOCAINE W/EPI) 1 %-1:100000 (with pres) injection 10 mL  10 mL Infiltration Once Yarissa Reining, MD         Allergies:    Ibuprofen, Nsaids, Salicylates, Codeine, Etodolac, and Hydrochlorothiazide   Social History   Socioeconomic History  . Marital status: Married    Spouse name: Not on file  . Number of children: Not on file  . Years of education: Not on file  . Highest education level: Not on file  Occupational History  . Not on file  Tobacco Use  . Smoking status: Former Smoker    Quit date: 1991    Years since quitting: 30.3  . Smokeless tobacco: Never Used  Substance and Sexual Activity  . Alcohol use: Yes    Alcohol/week: 3.0 standard drinks    Types: 3 Shots of liquor per week    Comment: 3 drinks per night, "too much"  . Drug use: No  . Sexual activity: Not on file  Other Topics Concern  . Not on file  Social History Narrative  . Not on file   Social Determinants of Health   Financial Resource Strain:   . Difficulty of Paying Living Expenses:   Food Insecurity:   . Worried About Charity fundraiser in the Last Year:   . Arboriculturist in the Last Year:   Transportation Needs:   . Film/video editor (Medical):   Marland Kitchen Lack of Transportation (Non-Medical):   Physical Activity:   . Days of Exercise per Week:   . Minutes of Exercise per Session:   Stress:   . Feeling of Stress :   Social Connections:   . Frequency of Communication with Friends and Family:   . Frequency of Social Gatherings with Friends and Family:   . Attends Religious Services:   . Active Member of Clubs or Organizations:   . Attends Archivist Meetings:   Marland Kitchen Marital Status:      Family History:  The patient's family history includes Breast cancer in her maternal aunt and maternal grandmother; Cancer in her father, maternal aunt, and maternal grandmother; Dementia in her mother; Heart attack in her maternal grandfather and paternal grandfather; Heart disease (age of onset: 36) in her father; Heart disease (age of onset: 20) in her mother; Stroke in her mother.   ROS:   Please see the history of  present illness.    All other systems are reviewed and are negative.   PHYSICAL EXAM:   VS:  BP (!) 142/64   Pulse 64   Temp (!) 97 F (36.1 C)   Ht 5\' 2"  (1.575 m)   Wt 165 lb (74.8 kg)   BMI 30.18 kg/m     General: Alert, oriented x3, no distress, borderline obese. Head: no evidence of trauma, PERRL, EOMI, no exophtalmos or lid lag, no myxedema, no xanthelasma; normal ears, nose and oropharynx Neck: normal jugular venous pulsations and no hepatojugular reflux; brisk carotid  pulses without delay and no carotid bruits Chest: clear to auscultation, no signs of consolidation by percussion or palpation, normal fremitus, symmetrical and full respiratory excursions Cardiovascular: normal position and quality of the apical impulse, regular rhythm, normal first and second heart sounds, no murmurs, rubs or gallops.  Well-healed parasternal implantable loop recorder site. Abdomen: no tenderness or distention, no masses by palpation, no abnormal pulsatility or arterial bruits, normal bowel sounds, no hepatosplenomegaly Extremities: no clubbing, cyanosis or edema; 2+ radial, ulnar and brachial pulses bilaterally; 2+ right femoral, posterior tibial and dorsalis pedis pulses; 2+ left femoral, posterior tibial and dorsalis pedis pulses; no subclavian or femoral bruits Neurological: grossly nonfocal Psych: Normal mood and affect   Wt Readings from Last 3 Encounters:  07/13/19 165 lb (74.8 kg)  03/10/19 171 lb (77.6 kg)  03/04/19 167 lb 12.3 oz (76.1 kg)      Studies/Labs Reviewed:   ECHO 03/04/2019:  1. Left ventricular ejection fraction, by visual estimation, is 60 to 65%. The left ventricle has normal function. There is no left ventricular hypertrophy.  2. Global right ventricle has normal systolic function.The right ventricular size is normal. No increase in right ventricular wall thickness.  3. Left atrial size was normal.  4. Right atrial size was normal.  5. The mitral valve is grossly  normal. No evidence of mitral valve regurgitation.  6. The tricuspid valve is grossly normal. Tricuspid valve regurgitation is not demonstrated.  7. Aortic valve regurgitation is mild to moderate.  8. The aortic valve is tricuspid. Aortic valve regurgitation is mild to moderate. Mild to moderate aortic valve stenosis.  9. The pulmonic valve was grossly normal. Pulmonic valve regurgitation is trivial. 10. The atrial septum is grossly normal.  EKG:  EKG is ordered today.  It shows normal sinus rhythm and is a normal tracing.  QTc 433 ms Recent Labs: 06/13/2019 hemoglobin A1c 5.3%, creatinine 0.57, potassium 4.0, normal liver function tests Lipid Panel    Component Value Date/Time   CHOL 159 07/26/2013 0718   TRIG 86 07/26/2013 0718   HDL 89 07/26/2013 0718   CHOLHDL 1.8 07/26/2013 0718   VLDL 17 07/26/2013 0718   LDLCALC 53 07/26/2013 0718  12/02/2018 Total cholesterol 176, HDL 80, LDL 81, triglycerides 79, hemoglobin A1c 5.4% 06/13/2019 Total cholesterol 181, HDL 76, LDL 81, triglycerides 80  ASSESSMENT:    1. Paroxysmal atrial fibrillation (HCC)   2. Long term current use of anticoagulant   3. Vasovagal syncope   4. Coronary artery disease involving native coronary artery of native heart without angina pectoris   5. Hypercholesterolemia   6. Essential hypertension   7. Status post placement of implantable loop recorder   8. TIA (transient ischemic attack)      PLAN:  In order of problems listed above:  1. AFib: She has had both atrial tachycardia and less frequently paroxysmal atrial fibrillation (see ECG 12-08-2013).  No episodes of atrial fibrillation have been detected since loop recorder implantation.  Compliant with anticoagulation. CHADSVasc 5 (age 9, gender, HTN, CAD). 2. Eliquis: No further falls, injuries or bleeding problems. 3. Syncope: All of her syncopal events are compatible with vasovagal mechanism, but cannot exclude an arrhythmic etiology especially with her  recent injuries.  In particular wonder about the possibility of postconversion pauses after episodes of atrial tachycardia or atrial fibrillation.  No events since loop recorder implantation. 4. CAD: She is asymptomatic despite being active.  This was discovered incidentally when she had coronary angiography for takotsubo syndrome.  5. HLP: Excellent lipid profile. 6. HTN: Satisfactory control.  Avoid "perfect" blood pressure control due to the increased risk of syncope and falls.  In particular avoid diuretics.  We will continue metoprolol and losartan. 7. ILR: Site has healed well.  No significant arrhythmia detected other than occasional PVCs. 8. Right arm and facial numbness: She had a normal MRI of the brain in December 2020. There was evidence of carotid bifurcation atherosclerosis on non-contrast head CT in December 2020. Will request carotid Doppler US for possible TIA and if symptoms recur I would recommend neurological evaluation.   Medication Adjustments/Labs and Tests Ordered: Current medicines are reviewed at length with the patient today.  Concerns regarding medicines are outlined above.  Medication changes, Labs and Tests ordered today are listed in the Patient Instructions below. Patient Instructions  Medication Instructions:  No changes *If you need a refill on your cardiac medications before your next appointment, please call your pharmacy*   Lab Work: None ordered If you have labs (blood work) drawn today and your tests are completely normal, you will receive your results only by: Marland Kitchen MyChart Message (if you have MyChart) OR . A paper copy in the mail If you have any lab test that is abnormal or we need to change your treatment, we will call you to review the results.   Testing/Procedures: None ordered   Follow-Up: At [facility], you and your health needs are our priority.  As part of our continuing mission to provide you with exceptional heart care, we have created  designated Provider Care Teams.  These Care Teams include your primary Cardiologist (physician) and Advanced Practice Providers (APPs -  Physician Assistants and Nurse Practitioners) who all work together to provide you with the care you need, when you need it.  We recommend signing up for the patient portal called "MyChart".  Sign up information is provided on this After Visit Summary.  MyChart is used to connect with patients for Virtual Visits (Telemedicine).  Patients are able to view lab/test results, encounter notes, upcoming appointments, etc.  Non-urgent messages can be sent to your provider as well.   To learn more about what you can do with MyChart, go to [email or web address].    Your next appointment:   12 month(s)  The format for your next appointment:   In Person  Provider:   You may see Sanda Klein, MD or one of the following Advanced Practice Providers on your designated Care Team:    Almyra Deforest, PA-C  Fabian Sharp, [place] or   Roby Lofts, PA-C       Signed, Sanda Klein, MD  07/15/2019 12:44 PM    [address], [place], [place]  [address] Phone: [phone]; Fax: [phone]

## 2019-07-13 NOTE — Patient Instructions (Signed)

## 2019-07-14 ENCOUNTER — Ambulatory Visit (INDEPENDENT_AMBULATORY_CARE_PROVIDER_SITE_OTHER): Payer: Medicare Other | Admitting: *Deleted

## 2019-07-14 DIAGNOSIS — R55 Syncope and collapse: Secondary | ICD-10-CM

## 2019-07-15 ENCOUNTER — Other Ambulatory Visit: Payer: Self-pay | Admitting: *Deleted

## 2019-07-15 ENCOUNTER — Encounter: Payer: Self-pay | Admitting: Cardiovascular Disease

## 2019-07-15 DIAGNOSIS — R55 Syncope and collapse: Secondary | ICD-10-CM

## 2019-07-15 LAB — CUP PACEART REMOTE DEVICE CHECK
Date Time Interrogation Session: 20210415194223
Implantable Pulse Generator Implant Date: 20210111

## 2019-07-15 NOTE — Progress Notes (Signed)
ILR Remote 

## 2019-07-22 ENCOUNTER — Other Ambulatory Visit: Payer: Self-pay

## 2019-07-22 ENCOUNTER — Ambulatory Visit (HOSPITAL_COMMUNITY)
Admission: RE | Admit: 2019-07-22 | Discharge: 2019-07-22 | Disposition: A | Discharge status: Home / Self Care | Payer: Medicare Other | Source: Ambulatory Visit | Attending: Cardiovascular Disease | Admitting: Cardiovascular Disease

## 2019-07-22 DIAGNOSIS — R55 Syncope and collapse: Secondary | ICD-10-CM

## 2019-08-15 ENCOUNTER — Ambulatory Visit (INDEPENDENT_AMBULATORY_CARE_PROVIDER_SITE_OTHER): Payer: Medicare Other | Admitting: *Deleted

## 2019-08-15 DIAGNOSIS — R55 Syncope and collapse: Secondary | ICD-10-CM | POA: Diagnosis not present

## 2019-08-15 LAB — CUP PACEART REMOTE DEVICE CHECK
Date Time Interrogation Session: 20210516194442
Implantable Pulse Generator Implant Date: 20210111

## 2019-08-16 NOTE — Progress Notes (Signed)
Carelink Summary Report / Loop Recorder 

## 2019-09-02 ENCOUNTER — Other Ambulatory Visit: Payer: Self-pay | Admitting: Cardiovascular Disease

## 2019-09-02 NOTE — Telephone Encounter (Signed)
Afib Last OV - on 06/2019 84yo 74.8kg Scr = 0.70 on 03/2019

## 2019-09-19 ENCOUNTER — Ambulatory Visit (INDEPENDENT_AMBULATORY_CARE_PROVIDER_SITE_OTHER): Payer: Medicare Other | Admitting: *Deleted

## 2019-09-19 DIAGNOSIS — R55 Syncope and collapse: Secondary | ICD-10-CM | POA: Diagnosis not present

## 2019-09-19 LAB — CUP PACEART REMOTE DEVICE CHECK
Date Time Interrogation Session: 20210620230537
Implantable Pulse Generator Implant Date: 20210111

## 2019-09-20 NOTE — Progress Notes (Signed)
Carelink Summary Report / Loop Recorder 

## 2019-10-11 ENCOUNTER — Other Ambulatory Visit: Payer: Self-pay

## 2019-10-11 ENCOUNTER — Ambulatory Visit (INDEPENDENT_AMBULATORY_CARE_PROVIDER_SITE_OTHER): Payer: Medicare Other | Admitting: Cardiovascular Disease

## 2019-10-11 ENCOUNTER — Encounter: Payer: Self-pay | Admitting: Cardiovascular Disease

## 2019-10-11 VITALS — BP 143/59 | HR 74 | Ht 62.0 in | Wt 163.2 lb

## 2019-10-11 DIAGNOSIS — I2510 Atherosclerotic heart disease of native coronary artery without angina pectoris: Secondary | ICD-10-CM

## 2019-10-11 DIAGNOSIS — R55 Syncope and collapse: Secondary | ICD-10-CM | POA: Diagnosis not present

## 2019-10-11 DIAGNOSIS — I480 Paroxysmal atrial fibrillation: Secondary | ICD-10-CM

## 2019-10-11 DIAGNOSIS — Z7901 Long term (current) use of anticoagulants: Secondary | ICD-10-CM | POA: Diagnosis not present

## 2019-10-11 DIAGNOSIS — E7800 Pure hypercholesterolemia, unspecified: Secondary | ICD-10-CM

## 2019-10-11 DIAGNOSIS — I10 Essential (primary) hypertension: Secondary | ICD-10-CM

## 2019-10-11 DIAGNOSIS — G459 Transient cerebral ischemic attack, unspecified: Secondary | ICD-10-CM

## 2019-10-11 DIAGNOSIS — Z95818 Presence of other cardiac implants and grafts: Secondary | ICD-10-CM

## 2019-10-11 NOTE — Patient Instructions (Signed)
Medication Instructions:  No changes *If you need a refill on your cardiac medications before your next appointment, please call your pharmacy*   Lab Work: None ordered If you have labs (blood work) drawn today and your tests are completely normal, you will receive your results only by: Marland Kitchen MyChart Message (if you have MyChart) OR . A paper copy in the mail If you have any lab test that is abnormal or we need to change your treatment, we will call you to review the results.   Testing/Procedures: None ordered   Follow-Up: At [facility], you and your health needs are our priority.  As part of our continuing mission to provide you with exceptional heart care, we have created designated Provider Care Teams.  These Care Teams include your primary Cardiologist (physician) and Advanced Practice Providers (APPs -  Physician Assistants and Nurse Practitioners) who all work together to provide you with the care you need, when you need it.  We recommend signing up for the patient portal called "MyChart".  Sign up information is provided on this After Visit Summary.  MyChart is used to connect with patients for Virtual Visits (Telemedicine).  Patients are able to view lab/test results, encounter notes, upcoming appointments, etc.  Non-urgent messages can be sent to your provider as well.   To learn more about what you can do with MyChart, go to [email or web address].    Your next appointment:   12 month(s)  The format for your next appointment:   In Person  Provider:   Sanda Klein, MD   Other Instructions A referral has been placed for Neurology-Dr. Jannifer Franklin. They will call to make this appointment. If you have not heard from them in the next week then please call them at [phone]

## 2019-10-11 NOTE — Progress Notes (Signed)
Cardiology Office Note    Date:  10/12/2019   ID:  Harsimran, Westman July 07, 1934, MRN [id number]  PCP:  [name], MD  Cardiologist:   Sanda Klein, MD   No chief complaint on file.   History of Present Illness:  Melinda [name] is a 85 y.o. female with recent recurrent syncope leading to implantable loop recorder, history of paroxysmal atrial fibrillation, takotsubo cardiomyopathy with complete resolution, minor coronary artery disease with 75% stenosis of a first oblique marginal artery (asymptomatic). Last echo in October 2019 showed normal LVEF 55-60%.   Since last appointment she has not had any new syncope or falls but she has had recurrent stereotypical neurological complaints. These occur without warning and without any particular pattern or provocation. On the average they have occurred once a week, but she has on occasion had them twice in a single day. Each episode lasts about 30 minutes and has a very repetitive pattern. She feels numbness in the little finger of her right hand that subsequently extends to her hand forearm face and tongue, all on the right side. This associates a less prominent motor weakness complaint and she has dropped objects from her hand when it occurs. She is worried that she may fall when it happens so she will sit down in a chair and wait for it to pass. The pattern seems to be slowly increasing in frequency.  There has been no arrhythmia recorded during any of these events, other than an occasional PVC.Marland Kitchen She had a 5-second episode of paroxysmal atrial tachycardia recorded on July 2, but no atrial fibrillation and no significant bradycardia or ventricular arrhythmia.  The patient specifically denies any chest pain at rest or with exertion, dyspnea at rest or with exertion, orthopnea, paroxysmal nocturnal dyspnea, syncope, palpitations, claudication, lower extremity edema, unexplained weight gain, cough, hemoptysis or wheezing.  Recent  testing showed satisfactory lipid profile parameters and normal routine labs. Carotid duplex ultrasonography was normal.   Past Medical History:  Diagnosis Date  . Allergy   . Anemia 1984   before hysterectomy  . Breast cancer ([place])    bilateral  . GERD (gastroesophageal reflux disease)   . History of blood product transfusion 1984  . Hyperlipidemia   . Hypertension   . Macular degeneration    right eye  . Myocardial infarction ([place])   . Osteopenia   . Personal history of radiation therapy   . Takotsubo cardiomyopathy     Past Surgical History:  Procedure Laterality Date  . ABDOMINAL HYSTERECTOMY  1984  . APPENDECTOMY    . BREAST BIOPSY Right 04/29/05   right   . BREAST BIOPSY Left 2006  . BREAST LUMPECTOMY Left 04/19/04   left with sentinel node  . BREAST LUMPECTOMY Right 2007  . CATARACT EXTRACTION     bilateral  . EYE SURGERY    . LEFT HEART CATHETERIZATION WITH CORONARY ANGIOGRAM N/A 07/26/2013   Procedure: LEFT HEART CATHETERIZATION WITH CORONARY ANGIOGRAM;  Surgeon: Jettie Booze, MD;  Location: [facility] CATH LAB;  Service: Cardiovascular;  Laterality: N/A;  . RETINAL LASER PROCEDURE     right    Current Medications: Outpatient Medications Prior to Visit  Medication Sig Dispense Refill  . acetaminophen (TYLENOL) 500 MG tablet Take 1,000 mg by mouth at bedtime as needed (for pain, headaches, or fever).     Marland Kitchen albuterol (PROAIR HFA) 108 (90 Base) MCG/ACT inhaler Inhale 1-2 puffs into the lungs every 6 (six) hours as needed for wheezing  or shortness of breath.    Marland Kitchen atorvastatin (LIPITOR) 20 MG tablet Take 20 mg by mouth at bedtime.     . cholecalciferol (VITAMIN D) 1000 units tablet Take 1,000 Units by mouth daily.    Marland Kitchen ELIQUIS 5 MG TABS tablet TAKE ONE TABLET BY MOUTH TWICE A DAY 180 tablet 1  . fexofenadine (ALLEGRA) 180 MG tablet Take 180 mg by mouth daily.     Marland Kitchen latanoprost (XALATAN) 0.005 % ophthalmic solution Place 1 drop into both eyes at bedtime.     Marland Kitchen  losartan (COZAAR) 50 MG tablet Take 50 mg by mouth daily.    . metoprolol succinate (TOPROL-XL) 25 MG 24 hr tablet Take 1 tablet (25 mg total) by mouth daily. 90 tablet 3  . nitroGLYCERIN (NITROSTAT) 0.4 MG SL tablet Place 1 tablet (0.4 mg total) under the tongue every 5 (five) minutes as needed for chest pain. 25 tablet 3  . sertraline (ZOLOFT) 25 MG tablet Take 25 mg by mouth daily as needed for anxiety.    . timolol (TIMOPTIC) 0.5 % ophthalmic solution Place 1 drop into the right eye every morning.     Facility-Administered Medications Prior to Visit  Medication Dose Route Frequency Provider Last Rate Last Admin  . lidocaine-EPINEPHrine (XYLOCAINE W/EPI) 1 %-1:100000 (with pres) injection 10 mL  10 mL Infiltration Once Zeeva Courser, MD         Allergies:   Ibuprofen, Nsaids, Salicylates, Codeine, Etodolac, and Hydrochlorothiazide   Social History   Socioeconomic History  . Marital status: Married    Spouse name: Not on file  . Number of children: Not on file  . Years of education: Not on file  . Highest education level: Not on file  Occupational History  . Not on file  Tobacco Use  . Smoking status: Former Smoker    Quit date: 1991    Years since quitting: 30.5  . Smokeless tobacco: Never Used  Vaping Use  . Vaping Use: Never used  Substance and Sexual Activity  . Alcohol use: Yes    Alcohol/week: 3.0 standard drinks    Types: 3 Shots of liquor per week    Comment: 3 drinks per night, "too much"  . Drug use: No  . Sexual activity: Not on file  Other Topics Concern  . Not on file  Social History Narrative  . Not on file   Social Determinants of Health   Financial Resource Strain:   . Difficulty of Paying Living Expenses:   Food Insecurity:   . Worried About Charity fundraiser in the Last Year:   . Arboriculturist in the Last Year:   Transportation Needs:   . Film/video editor (Medical):   Marland Kitchen Lack of Transportation (Non-Medical):   Physical Activity:   .  Days of Exercise per Week:   . Minutes of Exercise per Session:   Stress:   . Feeling of Stress :   Social Connections:   . Frequency of Communication with Friends and Family:   . Frequency of Social Gatherings with Friends and Family:   . Attends Religious Services:   . Active Member of Clubs or Organizations:   . Attends Archivist Meetings:   Marland Kitchen Marital Status:      Family History:  The patient's family history includes Breast cancer in her maternal aunt and maternal grandmother; Cancer in her father, maternal aunt, and maternal grandmother; Dementia in her mother; Heart attack in her maternal grandfather and  paternal grandfather; Heart disease (age of onset: 74) in her father; Heart disease (age of onset: 30) in her mother; Stroke in her mother.   ROS:   Please see the history of present illness.    All other systems are reviewed and are negative.   PHYSICAL EXAM:   VS:  BP (!) 143/59   Pulse 74   Ht 5\' 2"  (1.575 m)   Wt 163 lb 3.2 oz (74 kg)   SpO2 98%   BMI 29.85 kg/m      General: Alert, oriented x3, no distress, overweight Head: no evidence of trauma, PERRL, EOMI, no exophtalmos or lid lag, no myxedema, no xanthelasma; normal ears, nose and oropharynx Neck: normal jugular venous pulsations and no hepatojugular reflux; brisk carotid pulses without delay and no carotid bruits Chest: clear to auscultation, no signs of consolidation by percussion or palpation, normal fremitus, symmetrical and full respiratory excursions Cardiovascular: normal position and quality of the apical impulse, regular rhythm, normal first and second heart sounds, no murmurs, rubs or gallops Abdomen: no tenderness or distention, no masses by palpation, no abnormal pulsatility or arterial bruits, normal bowel sounds, no hepatosplenomegaly Extremities: no clubbing, cyanosis or edema; 2+ radial, ulnar and brachial pulses bilaterally; 2+ right femoral, posterior tibial and dorsalis pedis pulses; 2+  left femoral, posterior tibial and dorsalis pedis pulses; no subclavian or femoral bruits Neurological: grossly nonfocal Psych: Normal mood and affect   Wt Readings from Last 3 Encounters:  10/11/19 163 lb 3.2 oz (74 kg)  07/13/19 165 lb (74.8 kg)  03/10/19 171 lb (77.6 kg)      Studies/Labs Reviewed:   ECHO 03/04/2019:  1. Left ventricular ejection fraction, by visual estimation, is 60 to 65%. The left ventricle has normal function. There is no left ventricular hypertrophy.  2. Global right ventricle has normal systolic function.The right ventricular size is normal. No increase in right ventricular wall thickness.  3. Left atrial size was normal.  4. Right atrial size was normal.  5. The mitral valve is grossly normal. No evidence of mitral valve regurgitation.  6. The tricuspid valve is grossly normal. Tricuspid valve regurgitation is not demonstrated.  7. Aortic valve regurgitation is mild to moderate.  8. The aortic valve is tricuspid. Aortic valve regurgitation is mild to moderate. Mild to moderate aortic valve stenosis.  9. The pulmonic valve was grossly normal. Pulmonic valve regurgitation is trivial. 10. The atrial septum is grossly normal.  EKG:  EKG is not ordered today. Tracing from 07/13/2019 shows normal sinus rhythm and is a normal tracing.  QTc 433 ms Recent Labs: 06/13/2019 hemoglobin A1c 5.3%, creatinine 0.57, potassium 4.0, normal liver function tests Lipid Panel    Component Value Date/Time   CHOL 159 07/26/2013 0718   TRIG 86 07/26/2013 0718   HDL 89 07/26/2013 0718   CHOLHDL 1.8 07/26/2013 0718   VLDL 17 07/26/2013 0718   LDLCALC 53 07/26/2013 0718  12/02/2018 Total cholesterol 176, HDL 80, LDL 81, triglycerides 79, hemoglobin A1c 5.4% 06/13/2019 Total cholesterol 181, HDL 76, LDL 81, triglycerides 80  ASSESSMENT:    1. Paroxysmal atrial fibrillation (HCC)   2. Long term current use of anticoagulant   3. Vasovagal syncope   4. Atherosclerosis of  native coronary artery of native heart without angina pectoris   5. Hypercholesterolemia   6. Essential hypertension   7. Status post placement of implantable loop recorder   8. TIA (transient ischemic attack)      PLAN:  In order  of problems listed above:  1. AFib: She has had both atrial tachycardia and less frequently paroxysmal atrial fibrillation (see ECG 12-08-2013).  No episodes of atrial fibrillation have been detected since loop recorder implantation 6 months ago.  Compliant with anticoagulation. CHADSVasc 5 (age 57, gender, HTN, CAD). 2. Eliquis: No further falls, injuries or bleeding problems. 3. Syncope: All of her syncopal events are compatible with vasovagal mechanism, but cannot exclude an arrhythmic etiology especially with her recent injuries.  In particular wonder about the possibility of postconversion pauses after episodes of atrial tachycardia or atrial fibrillation.  No events since loop recorder implantation. 4. CAD: She is asymptomatic despite being active. Moderate CAD was discovered incidentally when she had coronary angiography for takotsubo syndrome.  5. HLP: Excellent lipid profile. 6. HTN: Satisfactory control. Her diastolic blood pressure is quite low. Avoid "perfect" blood pressure control due to the increased risk of syncope and falls.  In particular avoid diuretics.  We will continue metoprolol and losartan. 7. ILR: Site has healed well.  No significant arrhythmia detected other than occasional PVCs and a 5-second episodes of paroxysmal atrial tachycardia. The does not appear to be any connection between the arrhythmia and her neurological complaints. 8. Right arm and facial numbness: She had a normal MRI of the brain in December 2020. MRA was not performed. There was evidence of carotid bifurcation atherosclerosis on non-contrast head CT in December 2020. No evidence of large vessel obstruction by carotid Doppler US. Possible seizure: The repetitive nature of her  sensory and motor complaints in her right hand and face is consistent with a focal abnormality such as an atypical partial seizure or a recurrent ischemic event related to intracranial vascular obstruction. It cannot be explained by cardioembolic or arrhythmic events. She may need EEG and MRA. Recommend evaluation by neurology.    Medication Adjustments/Labs and Tests Ordered: Current medicines are reviewed at length with the patient today.  Concerns regarding medicines are outlined above.  Medication changes, Labs and Tests ordered today are listed in the Patient Instructions below. Patient Instructions  Medication Instructions:  No changes *If you need a refill on your cardiac medications before your next appointment, please call your pharmacy*   Lab Work: None ordered If you have labs (blood work) drawn today and your tests are completely normal, you will receive your results only by: Marland Kitchen MyChart Message (if you have MyChart) OR . A paper copy in the mail If you have any lab test that is abnormal or we need to change your treatment, we will call you to review the results.   Testing/Procedures: None ordered   Follow-Up: At [facility], you and your health needs are our priority.  As part of our continuing mission to provide you with exceptional heart care, we have created designated Provider Care Teams.  These Care Teams include your primary Cardiologist (physician) and Advanced Practice Providers (APPs -  Physician Assistants and Nurse Practitioners) who all work together to provide you with the care you need, when you need it.  We recommend signing up for the patient portal called "MyChart".  Sign up information is provided on this After Visit Summary.  MyChart is used to connect with patients for Virtual Visits (Telemedicine).  Patients are able to view lab/test results, encounter notes, upcoming appointments, etc.  Non-urgent messages can be sent to your provider as well.   To learn  more about what you can do with MyChart, go to [email or web address].    Your next appointment:  12 month(s)  The format for your next appointment:   In Person  Provider:   Sanda Klein, MD   Other Instructions A referral has been placed for Neurology-Dr. Jannifer Franklin. They will call to make this appointment. If you have not heard from them in the next week then please call them at [phone]    Signed, Sanda Klein, MD  10/12/2019 6:44 PM    [facility] [address], [place], [place]  [address] Phone: [phone]; Fax: [phone]

## 2019-10-12 ENCOUNTER — Encounter: Payer: Self-pay | Admitting: Cardiovascular Disease

## 2019-10-24 ENCOUNTER — Ambulatory Visit (INDEPENDENT_AMBULATORY_CARE_PROVIDER_SITE_OTHER): Payer: Medicare Other | Admitting: *Deleted

## 2019-10-24 DIAGNOSIS — R55 Syncope and collapse: Secondary | ICD-10-CM

## 2019-10-25 LAB — CUP PACEART REMOTE DEVICE CHECK
Date Time Interrogation Session: 20210725230627
Implantable Pulse Generator Implant Date: 20210111

## 2019-10-27 NOTE — Progress Notes (Signed)
Carelink Summary Report / Loop Recorder 

## 2019-11-06 ENCOUNTER — Emergency Department (HOSPITAL_COMMUNITY)
Admission: EM | Admit: 2019-11-06 | Discharge: 2019-11-06 | Disposition: A | Discharge status: Home / Self Care | Payer: Medicare Other | Attending: Emergency Medicine | Admitting: Emergency Medicine

## 2019-11-06 ENCOUNTER — Emergency Department (HOSPITAL_COMMUNITY): Payer: Medicare Other

## 2019-11-06 ENCOUNTER — Other Ambulatory Visit: Payer: Self-pay

## 2019-11-06 ENCOUNTER — Encounter (HOSPITAL_COMMUNITY): Payer: Self-pay | Admitting: *Deleted

## 2019-11-06 DIAGNOSIS — Y999 Unspecified external cause status: Secondary | ICD-10-CM | POA: Insufficient documentation

## 2019-11-06 DIAGNOSIS — Y92009 Unspecified place in unspecified non-institutional (private) residence as the place of occurrence of the external cause: Secondary | ICD-10-CM | POA: Diagnosis not present

## 2019-11-06 DIAGNOSIS — S0990XA Unspecified injury of head, initial encounter: Secondary | ICD-10-CM | POA: Diagnosis not present

## 2019-11-06 DIAGNOSIS — Z7982 Long term (current) use of aspirin: Secondary | ICD-10-CM | POA: Insufficient documentation

## 2019-11-06 DIAGNOSIS — S300XXA Contusion of lower back and pelvis, initial encounter: Secondary | ICD-10-CM | POA: Insufficient documentation

## 2019-11-06 DIAGNOSIS — W1809XA Striking against other object with subsequent fall, initial encounter: Secondary | ICD-10-CM | POA: Insufficient documentation

## 2019-11-06 DIAGNOSIS — Z79899 Other long term (current) drug therapy: Secondary | ICD-10-CM | POA: Diagnosis not present

## 2019-11-06 DIAGNOSIS — W19XXXA Unspecified fall, initial encounter: Secondary | ICD-10-CM

## 2019-11-06 DIAGNOSIS — Y939 Activity, unspecified: Secondary | ICD-10-CM | POA: Insufficient documentation

## 2019-11-06 NOTE — ED Triage Notes (Signed)
Pt arrived to the ed around 0100am  From [place]  She fell  After drinking alcohol  She has a bruised lt hand   Struck the lt side of her head  C/o coccyx pain no loc   Takes  eliquist   A and o x4

## 2019-11-06 NOTE — ED Provider Notes (Signed)
[facility] EMERGENCY DEPARTMENT Provider Note   CSN: [id number] Arrival date & time: 11/06/19  0117     History Chief Complaint  Patient presents with  . Fall    Melinda [name] is a 85 y.o. female.   85 y.o. female with hx of recurrent syncope leading to implantable loop recorder, PAF (on Eliquis), Takotsubo cardiomyopathy, CAD presents to the emergency department following a fall at home.  She states that she went to turn off the light and turned causing her foot to come out of her shoe.  She lost her balance and subsequently fell backwards striking her head and buttock.  Took Tylenol prior to arrival for soreness.  She complains of a mild headache.  Has blurry vision at baseline, but denies any worsening blurriness or vision loss.  Further denies nausea, vomiting, extremity numbness or paresthesias, extremity weakness, nausea, vomiting, lightheadedness, syncope, bowel or bladder incontinence.       History reviewed. No pertinent past medical history.  There are no problems to display for this patient.   History reviewed. No pertinent surgical history.   OB History   No obstetric history on file.     No family history on file.  Social History   Tobacco Use  . Smoking status: Never Smoker  . Smokeless tobacco: Never Used  Substance Use Topics  . Alcohol use: Yes  . Drug use: Not on file    Home Medications Prior to Admission medications   Medication Sig Start Date End Date Taking? Authorizing Provider  albuterol (VENTOLIN HFA) 108 (90 Base) MCG/ACT inhaler Inhale 1-2 puffs into the lungs every 4 (four) hours as needed for wheezing.  06/29/19  Yes [provider]  atorvastatin (LIPITOR) 20 MG tablet Take 20 mg by mouth daily. 09/02/19  Yes [provider]  ELIQUIS 5 MG TABS tablet Take 5 mg by mouth 2 (two) times daily. 09/26/19  Yes [provider]  latanoprost (XALATAN) 0.005 % ophthalmic solution Place 1 drop into both  eyes at bedtime.  05/18/19  Yes [provider]  losartan (COZAAR) 50 MG tablet Take 50 mg by mouth daily. 09/02/19  Yes [provider]  metoprolol succinate (TOPROL-XL) 25 MG 24 hr tablet Take 25 mg by mouth daily. 09/23/19  Yes [provider]  nitroGLYCERIN (NITROSTAT) 0.4 MG SL tablet Place 0.4 mg under the tongue every 5 (five) minutes as needed for chest pain.  07/13/19  Yes [provider]  sertraline (ZOLOFT) 25 MG tablet Take 25 mg by mouth daily. 09/05/19  Yes [provider]    Allergies    Ibuprofen, Nsaids, Aspirin, Codeine, Hctz [hydrochlorothiazide], Salicylates, and Etodolac  Review of Systems   Review of Systems  Ten systems reviewed and are negative for acute change, except as noted in the HPI.    Physical Exam Updated Vital Signs BP (!) 144/53   Pulse 76   Temp 98.1 F (36.7 C) (Oral)   Resp (!) 24   Ht 5\' 2"  (1.575 m)   Wt 72.6 kg   SpO2 95%   BMI 29.26 kg/m   Physical Exam Vitals and nursing note reviewed.  Constitutional:      General: She is not in acute distress.    Appearance: She is well-developed. She is not diaphoretic.     Comments: Nontoxic-appearing and in no distress  HENT:     Head: Normocephalic and atraumatic.     Comments: No battle sign or raccoon's eyes.  No significant  hematoma to scalp.    Mouth/Throat:     Comments: Clear posterior oropharynx Eyes:     General: No scleral icterus.    Extraocular Movements: Extraocular movements intact.     Conjunctiva/sclera: Conjunctivae normal.     Pupils: Pupils are equal, round, and reactive to light.  Neck:     Comments: No tenderness to palpation of the cervical midline.  No bony deformities, step-offs, crepitus. Pulmonary:     Effort: Pulmonary effort is normal. No respiratory distress.     Comments: Respirations even and unlabored Abdominal:     Comments: Soft, nontender, nondistended abdomen.  Musculoskeletal:        General: Normal range of  motion.     Cervical back: Normal range of motion.     Comments: No TTP to the thoracic or lumbosacral midline.  No bony deformities, step-offs, crepitus. No bruising to back.  Skin:    General: Skin is warm and dry.     Coloration: Skin is not pale.     Findings: No erythema or rash.     Comments: Abrasion to dorsum of left hand  Neurological:     Mental Status: She is alert and oriented to person, place, and time.     Coordination: Coordination normal.     Comments: GCS 15.  Answers questions appropriately and follows commands.  No focal deficits appreciated.  Moving all extremities spontaneously.  Psychiatric:        Behavior: Behavior normal.     ED Results / Procedures / Treatments   Labs (all labs ordered are listed, but only abnormal results are displayed) Labs Reviewed - No data to display  EKG None  Radiology CT Head Wo Contrast  Result Date: 11/06/2019 CLINICAL DATA:  Fall hitting left side of head EXAM: CT HEAD WITHOUT CONTRAST TECHNIQUE: Contiguous axial images were obtained from the base of the skull through the vertex without intravenous contrast. COMPARISON:  None. FINDINGS: Brain: No evidence of acute territorial infarction, hemorrhage, hydrocephalus,extra-axial collection or mass lesion/mass effect. There is dilatation the ventricles and sulci consistent with age-related atrophy. Low-attenuation changes in the deep white matter consistent with small vessel ischemia. Vascular: No hyperdense vessel or unexpected calcification. Skull: The skull is intact. No fracture or focal lesion identified. Sinuses/Orbits: The visualized paranasal sinuses and mastoid air cells are clear. The orbits and globes intact. Other: None IMPRESSION: No acute intracranial abnormality. Findings consistent with age related atrophy and chronic small vessel ischemia Electronically Signed   By: Prudencio Pair M.D.   On: 11/06/2019 02:21    Procedures Procedures (including critical care  time)  Medications Ordered in ED Medications - No data to display   ED Course  I have reviewed the triage vital signs and the nursing notes.  Pertinent labs & imaging results that were available during my care of the patient were reviewed by me and considered in my medical decision making (see chart for details).  Clinical Course as of Nov 06 243  Sun Nov 06, 2019  0240 Patient ambulatory to bathroom without assistance. Reports persistent soreness in her buttock. Offered Xray imaging which patient declines. Area examined again without change or evidence of blunt trauma.  CT head negative. Patient verbalizes understanding of results.   [KH]    Clinical Course User Index [KH] Beverely Pace   MDM Rules/Calculators/A&P                          85 year old  female presents to the emergency department after a mechanical fall at home causing her to strike her head and buttock.  She had no loss of consciousness.  Complaining of mild headache for which she took Tylenol prior to arrival.  Declines additional pain medication while in the emergency department.  Noted to have a nonfocal neurologic exam.  No battle sign, raccoon's eyes, hematoma to scalp.  Given that the patient is on Eliquis, decision was made to proceed with head CT.  This is negative for acute intracranial abnormality.  Patient remains in good spirits on repeat assessment.  She is able to ambulate to the bathroom with her cane which she uses as needed at baseline.  Do not feel further emergent work-up is indicated.  Encouraged follow-up with the patient's primary care doctor.  Return precautions discussed and provided. Patient discharged in stable condition with no unaddressed concerns.   Final Clinical Impression(s) / ED Diagnoses Final diagnoses:  Fall in home, initial encounter  Minor head injury, initial encounter  Contusion, buttock, initial encounter    Rx / DC Orders ED Discharge Orders    None       Antonietta Breach, PA-C 11/06/19 0316    Ripley Fraise, MD 11/06/19 [phone]

## 2019-11-06 NOTE — ED Notes (Signed)
Pt ambulated to the bathroom with no staff assistance, pt used cane that she brought from home.

## 2019-11-06 NOTE — ED Provider Notes (Signed)
Patient seen/examined in the Emergency Department in conjunction with Advanced Practice Provider [facility] Patient reports she fell and struck her head and on anticoagulation Exam : awake/alert, no acute distress, no extremity deformities Plan: CT head, ambulate and d/c home if no acute findings     Ripley Fraise, MD 11/06/19 0225

## 2019-11-07 ENCOUNTER — Encounter: Payer: Self-pay | Admitting: Cardiovascular Disease

## 2019-11-10 DIAGNOSIS — M533 Sacrococcygeal disorders, not elsewhere classified: Secondary | ICD-10-CM | POA: Diagnosis not present

## 2019-11-10 DIAGNOSIS — W010XXA Fall on same level from slipping, tripping and stumbling without subsequent striking against object, initial encounter: Secondary | ICD-10-CM | POA: Diagnosis not present

## 2019-11-27 LAB — CUP PACEART REMOTE DEVICE CHECK
Date Time Interrogation Session: 20210827230551
Implantable Pulse Generator Implant Date: 20210111

## 2019-11-28 ENCOUNTER — Ambulatory Visit (INDEPENDENT_AMBULATORY_CARE_PROVIDER_SITE_OTHER): Payer: Medicare Other | Admitting: *Deleted

## 2019-11-28 DIAGNOSIS — R55 Syncope and collapse: Secondary | ICD-10-CM | POA: Diagnosis not present

## 2019-11-30 NOTE — Progress Notes (Signed)
Carelink Summary Report / Loop Recorder 

## 2019-12-15 DIAGNOSIS — E559 Vitamin D deficiency, unspecified: Secondary | ICD-10-CM | POA: Diagnosis not present

## 2019-12-15 DIAGNOSIS — J301 Allergic rhinitis due to pollen: Secondary | ICD-10-CM | POA: Diagnosis not present

## 2019-12-15 DIAGNOSIS — R7303 Prediabetes: Secondary | ICD-10-CM | POA: Diagnosis not present

## 2019-12-15 DIAGNOSIS — I429 Cardiomyopathy, unspecified: Secondary | ICD-10-CM | POA: Diagnosis not present

## 2019-12-15 DIAGNOSIS — I119 Hypertensive heart disease without heart failure: Secondary | ICD-10-CM | POA: Diagnosis not present

## 2019-12-15 DIAGNOSIS — E7800 Pure hypercholesterolemia, unspecified: Secondary | ICD-10-CM | POA: Diagnosis not present

## 2019-12-15 DIAGNOSIS — I4891 Unspecified atrial fibrillation: Secondary | ICD-10-CM | POA: Diagnosis not present

## 2019-12-15 DIAGNOSIS — M818 Other osteoporosis without current pathological fracture: Secondary | ICD-10-CM | POA: Diagnosis not present

## 2019-12-15 DIAGNOSIS — I2510 Atherosclerotic heart disease of native coronary artery without angina pectoris: Secondary | ICD-10-CM | POA: Diagnosis not present

## 2019-12-15 DIAGNOSIS — M8000XD Age-related osteoporosis with current pathological fracture, unspecified site, subsequent encounter for fracture with routine healing: Secondary | ICD-10-CM | POA: Diagnosis not present

## 2019-12-15 DIAGNOSIS — Z0000 Encounter for general adult medical examination without abnormal findings: Secondary | ICD-10-CM | POA: Diagnosis not present

## 2019-12-15 DIAGNOSIS — Z1389 Encounter for screening for other disorder: Secondary | ICD-10-CM | POA: Diagnosis not present

## 2019-12-15 DIAGNOSIS — H401131 Primary open-angle glaucoma, bilateral, mild stage: Secondary | ICD-10-CM | POA: Diagnosis not present

## 2019-12-20 ENCOUNTER — Other Ambulatory Visit: Payer: Self-pay | Admitting: Family Medicine

## 2019-12-20 DIAGNOSIS — M8580 Other specified disorders of bone density and structure, unspecified site: Secondary | ICD-10-CM

## 2019-12-22 ENCOUNTER — Emergency Department (HOSPITAL_COMMUNITY)
Admission: EM | Admit: 2019-12-22 | Discharge: 2019-12-23 | Disposition: A | Discharge status: Home / Self Care | Payer: Medicare Other | Attending: Emergency Medicine | Admitting: Emergency Medicine

## 2019-12-22 ENCOUNTER — Encounter (HOSPITAL_COMMUNITY): Payer: Self-pay | Admitting: Emergency Medicine

## 2019-12-22 ENCOUNTER — Other Ambulatory Visit: Payer: Self-pay

## 2019-12-22 DIAGNOSIS — Z79899 Other long term (current) drug therapy: Secondary | ICD-10-CM | POA: Insufficient documentation

## 2019-12-22 DIAGNOSIS — R040 Epistaxis: Secondary | ICD-10-CM | POA: Diagnosis not present

## 2019-12-22 DIAGNOSIS — Z951 Presence of aortocoronary bypass graft: Secondary | ICD-10-CM | POA: Diagnosis not present

## 2019-12-22 DIAGNOSIS — R58 Hemorrhage, not elsewhere classified: Secondary | ICD-10-CM | POA: Diagnosis not present

## 2019-12-22 DIAGNOSIS — Z7901 Long term (current) use of anticoagulants: Secondary | ICD-10-CM | POA: Diagnosis not present

## 2019-12-22 DIAGNOSIS — I10 Essential (primary) hypertension: Secondary | ICD-10-CM | POA: Insufficient documentation

## 2019-12-22 DIAGNOSIS — I2510 Atherosclerotic heart disease of native coronary artery without angina pectoris: Secondary | ICD-10-CM | POA: Insufficient documentation

## 2019-12-22 DIAGNOSIS — Z853 Personal history of malignant neoplasm of breast: Secondary | ICD-10-CM | POA: Insufficient documentation

## 2019-12-22 DIAGNOSIS — G4489 Other headache syndrome: Secondary | ICD-10-CM | POA: Diagnosis not present

## 2019-12-22 NOTE — ED Triage Notes (Signed)
Pt to triage via [facility].  Reports nosebleed to R nostril since 3pm.  EMS administered Afrin 2 sprays PTA.  Also reports HTN (210/80) and headache.  Takes Eliquis.  Took BP medication today.  States it seems to have slowed down.  Gauze in place to R nostril.

## 2019-12-23 DIAGNOSIS — R040 Epistaxis: Secondary | ICD-10-CM | POA: Diagnosis not present

## 2019-12-23 MED ORDER — TRANEXAMIC ACID FOR EPISTAXIS
500.0000 mg | Freq: Once | TOPICAL | Status: DC
  Filled 2019-12-23: qty 10

## 2019-12-23 NOTE — Discharge Instructions (Addendum)
Return if bleeding starts up again.

## 2019-12-23 NOTE — ED Notes (Signed)
Patient verbalizes understanding of discharge instructions. Opportunity for questioning and answers were provided. Armband removed by staff, pt discharged from ED via wheelchair to lobby to return home with spouse.   

## 2019-12-23 NOTE — ED Provider Notes (Signed)
[facility] EMERGENCY DEPARTMENT Provider Note   CSN: [id number] Arrival date & time: 12/22/19  1746   History Chief Complaint  Patient presents with  . Epistaxis    Melinda [name] is a 85 y.o. female.  The history is provided by the patient.  Epistaxis She has history of hypertension, hyperlipidemia, paroxysmal atrial fibrillation anticoagulated on apixaban and comes in because of a nosebleed.  Bleeding is from the right nostril and started about 3 PM.  She denies any antecedent trauma.  She has not had problems with nosebleeds before.  Past Medical History:  Diagnosis Date  . Allergy   . Anemia 1984   before hysterectomy  . Breast cancer ([place])    bilateral  . GERD (gastroesophageal reflux disease)   . History of blood product transfusion 1984  . Hyperlipidemia   . Hypertension   . Macular degeneration    right eye  . Myocardial infarction ([place])   . Osteopenia   . Personal history of radiation therapy   . Takotsubo cardiomyopathy     Patient Active Problem List   Diagnosis Date Noted  . Syncope 03/04/2019  . Alcohol abuse 03/04/2019  . Fall at home, initial encounter 03/04/2019  . Vasovagal syncope 12/31/2017  . Long term current use of anticoagulant 11/26/2015  . Allergic rhinitis due to pollen 11/23/2015  . Asthmatic bronchitis 11/23/2015  . History of colon polyps 11/23/2015  . Other specified disorders of bone density and structure, other site 11/23/2015  . Glaucoma 11/23/2015  . Mood disorder ([place]) 11/23/2015  . Vitamin D deficiency 11/23/2015  . Paroxysmal atrial fibrillation ([place]) 12/08/2013  . Paroxysmal A-fib ([place]) 12/08/2013  . CAD (coronary artery disease) - 75% ostial OM1 10/21/2013  . Takotsubo cardiomyopathy   . NSTEMI (non-ST elevated myocardial infarction) ([place]) 07/25/2013  . Chest pain 09/09/2012  . GERD (gastroesophageal reflux disease) 09/09/2012  . Essential hypertension 09/09/2012  . Hypercholesterolemia  09/09/2012  . hx: breast cancer, left, invasive ductal carcinoma, right, DCIS 02/19/2011    Past Surgical History:  Procedure Laterality Date  . ABDOMINAL HYSTERECTOMY  1984  . APPENDECTOMY    . BREAST BIOPSY Right 04/29/05   right   . BREAST BIOPSY Left 2006  . BREAST LUMPECTOMY Left 04/19/04   left with sentinel node  . BREAST LUMPECTOMY Right 2007  . CATARACT EXTRACTION     bilateral  . EYE SURGERY    . LEFT HEART CATHETERIZATION WITH CORONARY ANGIOGRAM N/A 07/26/2013   Procedure: LEFT HEART CATHETERIZATION WITH CORONARY ANGIOGRAM;  Surgeon: Jettie Booze, MD;  Location: [facility] CATH LAB;  Service: Cardiovascular;  Laterality: N/A;  . RETINAL LASER PROCEDURE     right     OB History   No obstetric history on file.     Family History  Problem Relation Age of Onset  . Dementia Mother   . Stroke Mother   . Heart disease Mother 4  . Cancer Father        Prostate  . Heart disease Father 58  . Cancer Maternal Aunt        Breast  . Breast cancer Maternal Aunt   . Cancer Maternal Grandmother   . Breast cancer Maternal Grandmother   . Heart attack Maternal Grandfather   . Heart attack Paternal Grandfather     Social History   Tobacco Use  . Smoking status: Never Smoker  . Smokeless tobacco: Never Used  Vaping Use  . Vaping Use: Never used  Substance Use  Topics  . Alcohol use: Yes    Comment: 3 drinks per night, "too much"  . Drug use: No    Home Medications Prior to Admission medications   Medication Sig Start Date End Date Taking? Authorizing Provider  acetaminophen (TYLENOL) 500 MG tablet Take 1,000 mg by mouth at bedtime as needed (for pain, headaches, or fever).     [provider]  albuterol (PROAIR HFA) 108 (90 Base) MCG/ACT inhaler Inhale 1-2 puffs into the lungs every 6 (six) hours as needed for wheezing or shortness of breath.    [provider]  albuterol (VENTOLIN HFA) 108 (90 Base) MCG/ACT inhaler Inhale 1-2 puffs into the lungs  every 4 (four) hours as needed for wheezing.  06/29/19   [provider]  atorvastatin (LIPITOR) 20 MG tablet Take 20 mg by mouth at bedtime.     [provider]  atorvastatin (LIPITOR) 20 MG tablet Take 20 mg by mouth daily. 09/02/19   [provider]  cholecalciferol (VITAMIN D) 1000 units tablet Take 1,000 Units by mouth daily.    [provider]  ELIQUIS 5 MG TABS tablet TAKE ONE TABLET BY MOUTH TWICE A DAY 09/02/19   Croitoru, Mihai, MD  ELIQUIS 5 MG TABS tablet Take 5 mg by mouth 2 (two) times daily. 09/26/19   [provider]  fexofenadine (ALLEGRA) 180 MG tablet Take 180 mg by mouth daily.     [provider]  latanoprost (XALATAN) 0.005 % ophthalmic solution Place 1 drop into both eyes at bedtime.  06/17/13   [provider]  latanoprost (XALATAN) 0.005 % ophthalmic solution Place 1 drop into both eyes at bedtime.  05/18/19   [provider]  losartan (COZAAR) 50 MG tablet Take 50 mg by mouth daily. 11/10/16   [provider]  losartan (COZAAR) 50 MG tablet Take 50 mg by mouth daily. 09/02/19   [provider]  metoprolol succinate (TOPROL-XL) 25 MG 24 hr tablet Take 1 tablet (25 mg total) by mouth daily. 03/22/19   Croitoru, Mihai, MD  metoprolol succinate (TOPROL-XL) 25 MG 24 hr tablet Take 25 mg by mouth daily. 09/23/19   [provider]  nitroGLYCERIN (NITROSTAT) 0.4 MG SL tablet Place 1 tablet (0.4 mg total) under the tongue every 5 (five) minutes as needed for chest pain. 07/13/19   Croitoru, Mihai, MD  nitroGLYCERIN (NITROSTAT) 0.4 MG SL tablet Place 0.4 mg under the tongue every 5 (five) minutes as needed for chest pain.  07/13/19   [provider]  sertraline (ZOLOFT) 25 MG tablet Take 25 mg by mouth daily as needed for anxiety. 10/05/17   [provider]  sertraline (ZOLOFT) 25 MG tablet Take 25 mg by mouth daily. 09/05/19   [provider]  timolol (TIMOPTIC) 0.5 %  ophthalmic solution Place 1 drop into the right eye every morning. 01/31/19   [provider]    Allergies    Ibuprofen, Ibuprofen, Nsaids, Nsaids, Salicylates, Aspirin, Codeine, Hctz [hydrochlorothiazide], Salicylates, Codeine, Etodolac, and Hydrochlorothiazide  Review of Systems   Review of Systems  HENT: Positive for nosebleeds.   All other systems reviewed and are negative.   Physical Exam Updated Vital Signs BP (!) 189/109 (BP Location: Right Arm)   Pulse 85   Temp 98.6 F (37 C) (Oral)   Resp 18   SpO2 100%   Physical Exam Vitals and nursing note reviewed.   85 year old female, resting comfortably and in no acute distress. Vital signs are  significant for elevated blood pressure. Oxygen saturation is 100%, which is normal. Head is normocephalic and atraumatic. PERRLA, EOMI. Oropharynx is clear.  Bleeding site is identified on the right side of the nasal septum and Kiesselbach's plexus. Neck is nontender and supple without adenopathy or JVD. Back is nontender and there is no CVA tenderness. Lungs are clear without rales, wheezes, or rhonchi. Chest is nontender. Heart has regular rate and rhythm without murmur. Abdomen is soft, flat, nontender without masses or hepatosplenomegaly and peristalsis is normoactive. Extremities have no cyanosis or edema, full range of motion is present. Skin is warm and dry without rash. Neurologic: Mental status is normal, cranial nerves are intact, there are no motor or sensory deficits.  ED Results / Procedures / Treatments    Procedures .Epistaxis Management  Date/Time: 12/23/2019 2:17 AM Performed by: Delora Fuel, MD Authorized by: Delora Fuel, MD   Consent:    Consent obtained:  Verbal   Consent given by:  Patient   Risks discussed:  Bleeding and pain   Alternatives discussed:  Alternative treatment Anesthesia (see MAR for exact dosages):    Anesthesia method:  None Procedure details:    Treatment site:  R anterior    Treatment method:  Anterior pack (Cotton soaked with tranexamic acid)   Treatment complexity:  Limited   Treatment episode: initial   Post-procedure details:    Assessment:  Bleeding stopped   Patient tolerance of procedure:  Tolerated well, no immediate complications     Medications Ordered in ED Medications  tranexamic acid (CYKLOKAPRON) 1000 MG/10ML topical solution 500 mg (has no administration in time range)    ED Course  I have reviewed the triage vital signs and the nursing notes.  MDM Rules/Calculators/A&P Right-sided epistaxis and patient anticoagulated on apixaban.  Old records are reviewed, and she has no prior ED visits for epistaxis.  Office notes present for management of paroxysmal atrial fibrillation with anticoagulation.  Will try topical tranexamic acid.  Following application of tranexamic acid, nosebleed stopped. She is observed in the ED for 45 minutes with no recurrence of bleeding. At this point, she was felt to be safe for discharge. Return precautions discussed.  Final Clinical Impression(s) / ED Diagnoses Final diagnoses:  Right-sided epistaxis  Chronic anticoagulation  Elevated blood pressure reading with diagnosis of hypertension    Rx / DC Orders ED Discharge Orders    None       Delora Fuel, MD 00/86/76 [phone]

## 2019-12-27 ENCOUNTER — Telehealth: Payer: Self-pay | Admitting: Cardiovascular Disease

## 2019-12-27 NOTE — Telephone Encounter (Signed)
Reports having headaches so she had her BP checked today by nurse at [facility]. BP was 188/80. Took tylenol twice today but felt little relief. Described headache as dull/sinus headache above eyes. Denies blurred vision, sob, chest pain or dizziness.  Medications reviewed Says she doesn't have a working BP cuff at home but says she can have her BP checked by nursing staff where she lives. Says she has contacted her PCP for an appointment but nothing scheduled Advised to monitor BP daily, 1-2 hours after BP medications, same arm, same cuff Advised to records readings and contact our office in one week with the results Advised to contact PCP again for f/u on headache and nose bleed Verbalized understanding of plan

## 2019-12-27 NOTE — Telephone Encounter (Signed)
Pt c/o BP issue: STAT if pt c/o blurred vision, one-sided weakness or slurred speech  1. What are your last 5 BP readings?  188/80 today  229 over something in the hospital   2. Are you having any other symptoms (ex. Dizziness, headache, blurred vision, passed out)? Headache   3. What is your BP issue? Melinda [name] is calling stating she was seen in the hospital for hypertension and a nose bleed. She states they did not make any medication changes and her BP today is 188/80. She is wanting to know if med changes need to be made. Please advise.

## 2019-12-27 NOTE — Telephone Encounter (Signed)
Please increase the losartan to 100 mg daily and check back in with BP readings in a couple of days

## 2019-12-28 NOTE — Telephone Encounter (Signed)
Patient informed. Advised to take (2) of her 50 mg losartan daily. Verbalized understanding of plan. When patient calls back with BP readings on Friday, will need new prescription sent to pharmacy for 100 mg losartan if continue on this dose.

## 2019-12-29 ENCOUNTER — Other Ambulatory Visit: Payer: Self-pay

## 2019-12-29 ENCOUNTER — Encounter: Payer: Self-pay | Admitting: Neurology

## 2019-12-29 ENCOUNTER — Ambulatory Visit (INDEPENDENT_AMBULATORY_CARE_PROVIDER_SITE_OTHER): Payer: Medicare Other | Admitting: Neurology

## 2019-12-29 VITALS — BP 180/81 | HR 73 | Ht 62.0 in | Wt 169.0 lb

## 2019-12-29 DIAGNOSIS — R55 Syncope and collapse: Secondary | ICD-10-CM

## 2019-12-29 DIAGNOSIS — I2510 Atherosclerotic heart disease of native coronary artery without angina pectoris: Secondary | ICD-10-CM

## 2019-12-29 DIAGNOSIS — G459 Transient cerebral ischemic attack, unspecified: Secondary | ICD-10-CM | POA: Diagnosis not present

## 2019-12-29 NOTE — Progress Notes (Signed)
Reason for visit: TIA like events, syncope  Referring physician: Dr. Marisa Sprinkles Melinda [name] is a 85 y.o. female  History of present illness:  Melinda [name] is an 85 year old right-handed white female with a history of atrial fibrillation on anticoagulation.  The patient was admitted to the hospital on 18 March 2019 with an episode of numbness involving the right hand with a spread up the arm to the right face over 5 minutes.  The patient will have numbness of the tongue as well as the face and some slurring of speech.  At that time, MRI of the brain did not show evidence of an acute stroke.  She had been off of her anticoagulant therapy for couple weeks due to several episodes of syncope.  Her syncope is associated with visual dimming prior to loss of consciousness which is fairly brief.  The patient claims that she has gone on since December 2020 to have frequent episodes of stereotypical numbness of the right hand with a gradual spread to the face and resolution of the symptoms over 30 minutes.  The patient denies any headaches with above events.  She may have some slight weakness of the right face and hand with the event.  She has not had any episodes however over the last month or so.  The patient had a loop recorder placed and she has not had any blackout episodes since the loop recorder was placed.  She has had an EEG study that was unremarkable.  A carotid Doppler study was unremarkable.  She has had some problems with elevation her blood pressure, she recently had an increase in her blood pressure medication to accommodate this.  The patient has had a chronic issue with balance, she has used a cane for least 2 years.  She denies issues controlling the bowels or the bladder.  She is sent to this office for an evaluation.  Past Medical History:  Diagnosis Date  . Allergy   . Anemia 1984   before hysterectomy  . Breast cancer ([place])    bilateral  . GERD (gastroesophageal reflux  disease)   . History of blood product transfusion 1984  . Hyperlipidemia   . Hypertension   . Macular degeneration    right eye  . Myocardial infarction ([place])   . Osteopenia   . Personal history of radiation therapy   . Takotsubo cardiomyopathy     Past Surgical History:  Procedure Laterality Date  . ABDOMINAL HYSTERECTOMY  1984  . APPENDECTOMY    . BREAST BIOPSY Right 04/29/05   right   . BREAST BIOPSY Left 2006  . BREAST LUMPECTOMY Left 04/19/04   left with sentinel node  . BREAST LUMPECTOMY Right 2007  . CATARACT EXTRACTION     bilateral  . EYE SURGERY    . LEFT HEART CATHETERIZATION WITH CORONARY ANGIOGRAM N/A 07/26/2013   Procedure: LEFT HEART CATHETERIZATION WITH CORONARY ANGIOGRAM;  Surgeon: Jettie Booze, MD;  Location: [facility] CATH LAB;  Service: Cardiovascular;  Laterality: N/A;  . RETINAL LASER PROCEDURE     right    Family History  Problem Relation Age of Onset  . Dementia Mother   . Stroke Mother   . Heart disease Mother 41  . Cancer Father        Prostate  . Heart disease Father 53  . Cancer Maternal Aunt        Breast  . Breast cancer Maternal Aunt   . Cancer Maternal Grandmother   .  Breast cancer Maternal Grandmother   . Heart attack Maternal Grandfather   . Heart attack Paternal Grandfather     Social history:  reports that she quit smoking about 31 years ago. Her smoking use included cigarettes. She has never used smokeless tobacco. She reports current alcohol use. She reports that she does not use drugs.  Medications:  Prior to Admission medications   Medication Sig Start Date End Date Taking? Authorizing Provider  acetaminophen (TYLENOL) 500 MG tablet Take 1,000 mg by mouth at bedtime as needed (for pain, headaches, or fever).    Yes [provider]  albuterol (PROAIR HFA) 108 (90 Base) MCG/ACT inhaler Inhale 1-2 puffs into the lungs every 6 (six) hours as needed for wheezing or shortness of breath.   Yes [provider]    atorvastatin (LIPITOR) 20 MG tablet Take 20 mg by mouth at bedtime.    Yes [provider]  cholecalciferol (VITAMIN D) 1000 units tablet Take 1,000 Units by mouth daily.   Yes [provider]  ELIQUIS 5 MG TABS tablet Take 5 mg by mouth 2 (two) times daily. 09/26/19  Yes [provider]  fexofenadine (ALLEGRA) 180 MG tablet Take 180 mg by mouth daily.    Yes [provider]  latanoprost (XALATAN) 0.005 % ophthalmic solution Place 1 drop into both eyes at bedtime.  05/18/19  Yes [provider]  losartan (COZAAR) 50 MG tablet Take 100 mg by mouth daily. 09/02/19  Yes [provider]  metoprolol succinate (TOPROL-XL) 25 MG 24 hr tablet Take 25 mg by mouth daily. 09/23/19  Yes [provider]  nitroGLYCERIN (NITROSTAT) 0.4 MG SL tablet Place 1 tablet (0.4 mg total) under the tongue every 5 (five) minutes as needed for chest pain. 07/13/19  Yes Croitoru, Mihai, MD  sertraline (ZOLOFT) 25 MG tablet Take 25 mg by mouth daily. 09/05/19  Yes [provider]  timolol (TIMOPTIC) 0.5 % ophthalmic solution Place 1 drop into the right eye every morning.  01/31/19  Yes [provider]      Allergies  Allergen Reactions  . Ibuprofen Shortness Of Breath and Other (See Comments)    Breathing issues  . Ibuprofen Shortness Of Breath  . Nsaids Hives, Shortness Of Breath and Other (See Comments)    Extreme hives and difficulty breathing. "Has had to go to the ER."  . Nsaids Hives and Shortness Of Breath  . Salicylates Other (See Comments)    Extreme hives and difficulty breathing. "Has had to go to the ER."  . Aspirin Other (See Comments)    unknown  . Codeine Nausea Only  . Hctz [Hydrochlorothiazide] Other (See Comments)    unknown  . Salicylates Other (See Comments)    unknown  . Codeine Nausea Only  . Etodolac Rash  . Hydrochlorothiazide Other (See Comments)    Hyponatremia    ROS:  Out of a complete 14 system review of  symptoms, the patient complains only of the following symptoms, and all other reviewed systems are negative.  Syncope Transient right hand and arm and face numbness Walking difficulty  Blood pressure (!) 180/81, pulse 73, height 5\' 2"  (1.575 m), weight 169 lb (76.7 kg).  Physical Exam  General: The patient is alert and cooperative at the time of the examination.  Eyes: Pupils are equal, round, and reactive to light. Discs are flat bilaterally.  Neck: The neck is supple, no carotid bruits are noted.  Respiratory: The respiratory examination is clear.  Cardiovascular:  The cardiovascular examination reveals a regular rate and rhythm, a grade III/VI systolic ejection murmur in the aortic area is noted.  Skin: Extremities are without significant edema.  Neurologic Exam  Mental status: The patient is alert and oriented x 3 at the time of the examination. The patient has apparent normal recent and remote memory, with an apparently normal attention span and concentration ability.  Cranial nerves: Facial symmetry is present. There is good sensation of the face to pinprick and soft touch bilaterally. The strength of the facial muscles and the muscles to head turning and shoulder shrug are normal bilaterally. Speech is well enunciated, no aphasia or dysarthria is noted. Extraocular movements are full. Visual fields are full. The tongue is midline, and the patient has symmetric elevation of the soft palate. No obvious hearing deficits are noted.  Motor: The motor testing reveals 5 over 5 strength of all 4 extremities. Good symmetric motor tone is noted throughout.  Sensory: Sensory testing is intact to pinprick, soft touch, vibration sensation, and position sense on all 4 extremities. No evidence of extinction is noted.  Coordination: Cerebellar testing reveals good finger-nose-finger and heel-to-shin bilaterally.  Gait and station: Gait is somewhat wide-based, unsteady.  Patient only uses a  cane for ambulation.  Tandem gait is not attempted.  Romberg is negative.  Reflexes: Deep tendon reflexes are symmetric and normal bilaterally. Toes are downgoing bilaterally.   MRI brain 03/18/20:  IMPRESSION: 1. No acute intracranial infarct or other abnormality identified. 2. Age-related cerebral atrophy with mild chronic small vessel ischemic disease.  * MRI scan images were reviewed online. I agree with the written report.    Assessment/Plan:  1.  Episodic right hand numbness, right face numbness  2.  Recurrent syncope  The patient has had stereotypical events of right hand numbness with gradual spread to the right face lasting 30 minutes with full resolution.  These events represent a "[place] march" and may represent seizures or a migraine type equivalent.  The patient however will be sent for CT angiogram of the head, the carotid Doppler study was unremarkable.  The patient has had events of syncope associated with visual dimming prior to onset of loss of consciousness consistent with an event that lowers blood pressure.  The patient has not had any events since a loop recorder has been placed.  The patient is not having the sensory events over the last month, if the events recur, I will add a low-dose of an anticonvulsant medication to her drug regimen.  She will follow up here in 4 or 5 months.   Jill Alexanders MD 12/29/2019 3:42 PM  [facility] [address] [place], [place] [address]  Phone [phone] Fax [phone]

## 2019-12-30 ENCOUNTER — Telehealth: Payer: Self-pay | Admitting: Cardiovascular Disease

## 2019-12-30 NOTE — Telephone Encounter (Signed)
3 day BP readings:   9/28- 188/80  9/29- 160/80 9/30- 140/72 in the AM- 180/81 in the afternoon at doctor. Unable to get BP today, and no HR to give.   Advised patient to monitor for another week since just starting the new increase dose- she will get BP and HR for next report on BP.  No symptoms at this time to report.  Will notify MD.

## 2019-12-30 NOTE — Telephone Encounter (Signed)
New message:     Patient calling to report her BP to the nurse. Please call patient.

## 2019-12-30 NOTE — Telephone Encounter (Signed)
Agree - another week of data

## 2020-01-02 ENCOUNTER — Ambulatory Visit (INDEPENDENT_AMBULATORY_CARE_PROVIDER_SITE_OTHER): Payer: Medicare Other

## 2020-01-02 ENCOUNTER — Telehealth: Payer: Self-pay | Admitting: Neurology

## 2020-01-02 DIAGNOSIS — H401131 Primary open-angle glaucoma, bilateral, mild stage: Secondary | ICD-10-CM | POA: Diagnosis not present

## 2020-01-02 DIAGNOSIS — H353212 Exudative age-related macular degeneration, right eye, with inactive choroidal neovascularization: Secondary | ICD-10-CM | POA: Diagnosis not present

## 2020-01-02 DIAGNOSIS — H47022 Hemorrhage in optic nerve sheath, left eye: Secondary | ICD-10-CM | POA: Diagnosis not present

## 2020-01-02 DIAGNOSIS — H353122 Nonexudative age-related macular degeneration, left eye, intermediate dry stage: Secondary | ICD-10-CM | POA: Diagnosis not present

## 2020-01-02 DIAGNOSIS — I480 Paroxysmal atrial fibrillation: Secondary | ICD-10-CM | POA: Diagnosis not present

## 2020-01-02 LAB — CUP PACEART REMOTE DEVICE CHECK
Date Time Interrogation Session: 20210929230506
Implantable Pulse Generator Implant Date: 20210111

## 2020-01-02 NOTE — Telephone Encounter (Signed)
medicare/uh[facility] Karie Fetch: [id number] exp. 01/02/20 to 04/01/20) order sent to GI. They will reach out to the patient to schedule.

## 2020-01-04 NOTE — Progress Notes (Signed)
Carelink Summary Report / Loop Recorder 

## 2020-01-06 DIAGNOSIS — Z23 Encounter for immunization: Secondary | ICD-10-CM | POA: Diagnosis not present

## 2020-01-09 NOTE — Telephone Encounter (Signed)
The patient has been made aware and is going to contact an ENT.

## 2020-01-09 NOTE — Telephone Encounter (Signed)
BP is right where we want it. Not shooting for perfect due to her history of low DBP and falls. OK to hold Eliquis for 24 hours for nose bleed. Has she seen an ENT to see if there is a correctable cause for the nose bleeds?

## 2020-01-09 NOTE — Telephone Encounter (Signed)
1) Continues to have nose bleeds that last several hours, last night 10 pm - 3 am. Has had bleeds several times a week since 12/22/2019  Has not seen an ENT  Did not take Eliquis last night secondary to bleed  Is it ok to hold Eliquis if she has a nose bleed?  2) blood pressure readings, all around same time 10/4 152/70 10/5 140/68 10/6 142/66  10/7 140/64  10/8 142/68  Will forward to Dr Sallyanne Kuster for review

## 2020-01-09 NOTE — Telephone Encounter (Signed)
    Pt is calling back, she wanted to speak with Lattie Haw or Almyra Free. She said she need to give her BP reading and also she kept having nose bleeds. She would like to know if that's from her eliquis

## 2020-01-17 ENCOUNTER — Other Ambulatory Visit: Payer: Self-pay

## 2020-01-17 ENCOUNTER — Emergency Department (HOSPITAL_COMMUNITY): Payer: Medicare Other

## 2020-01-17 ENCOUNTER — Emergency Department (HOSPITAL_COMMUNITY)
Admission: EM | Admit: 2020-01-17 | Discharge: 2020-01-17 | Disposition: A | Discharge status: Home / Self Care | Payer: Medicare Other | Attending: Emergency Medicine | Admitting: Emergency Medicine

## 2020-01-17 ENCOUNTER — Encounter (HOSPITAL_COMMUNITY): Payer: Self-pay | Admitting: Emergency Medicine

## 2020-01-17 ENCOUNTER — Ambulatory Visit
Admission: RE | Admit: 2020-01-17 | Discharge: 2020-01-17 | Disposition: A | Discharge status: Home / Self Care | Payer: Medicare Other | Source: Ambulatory Visit | Attending: Neurology | Admitting: Neurology

## 2020-01-17 DIAGNOSIS — I2510 Atherosclerotic heart disease of native coronary artery without angina pectoris: Secondary | ICD-10-CM | POA: Insufficient documentation

## 2020-01-17 DIAGNOSIS — I6523 Occlusion and stenosis of bilateral carotid arteries: Secondary | ICD-10-CM | POA: Diagnosis not present

## 2020-01-17 DIAGNOSIS — J9811 Atelectasis: Secondary | ICD-10-CM | POA: Diagnosis not present

## 2020-01-17 DIAGNOSIS — Z87891 Personal history of nicotine dependence: Secondary | ICD-10-CM | POA: Diagnosis not present

## 2020-01-17 DIAGNOSIS — Z853 Personal history of malignant neoplasm of breast: Secondary | ICD-10-CM | POA: Insufficient documentation

## 2020-01-17 DIAGNOSIS — R079 Chest pain, unspecified: Secondary | ICD-10-CM

## 2020-01-17 DIAGNOSIS — I672 Cerebral atherosclerosis: Secondary | ICD-10-CM | POA: Diagnosis not present

## 2020-01-17 DIAGNOSIS — Z79899 Other long term (current) drug therapy: Secondary | ICD-10-CM | POA: Insufficient documentation

## 2020-01-17 DIAGNOSIS — R0789 Other chest pain: Secondary | ICD-10-CM | POA: Insufficient documentation

## 2020-01-17 DIAGNOSIS — I10 Essential (primary) hypertension: Secondary | ICD-10-CM | POA: Diagnosis not present

## 2020-01-17 DIAGNOSIS — I4891 Unspecified atrial fibrillation: Secondary | ICD-10-CM | POA: Diagnosis not present

## 2020-01-17 DIAGNOSIS — R0602 Shortness of breath: Secondary | ICD-10-CM | POA: Diagnosis not present

## 2020-01-17 DIAGNOSIS — G319 Degenerative disease of nervous system, unspecified: Secondary | ICD-10-CM | POA: Diagnosis not present

## 2020-01-17 DIAGNOSIS — I6782 Cerebral ischemia: Secondary | ICD-10-CM | POA: Diagnosis not present

## 2020-01-17 DIAGNOSIS — Z7901 Long term (current) use of anticoagulants: Secondary | ICD-10-CM | POA: Diagnosis not present

## 2020-01-17 DIAGNOSIS — G459 Transient cerebral ischemic attack, unspecified: Secondary | ICD-10-CM

## 2020-01-17 DIAGNOSIS — R519 Headache, unspecified: Secondary | ICD-10-CM | POA: Diagnosis not present

## 2020-01-17 DIAGNOSIS — R55 Syncope and collapse: Secondary | ICD-10-CM | POA: Diagnosis not present

## 2020-01-17 LAB — BASIC METABOLIC PANEL
Anion gap: 9 (ref 5–15)
BUN: 9 mg/dL (ref 8–23)
CO2: 30 mmol/L (ref 22–32)
Calcium: 9.4 mg/dL (ref 8.9–10.3)
Chloride: 98 mmol/L (ref 98–111)
Creatinine, Ser: 0.57 mg/dL (ref 0.44–1.00)
GFR, Estimated: >60 mL/min (ref >60)
Glucose, Bld: 102 mg/dL — ABNORMAL HIGH (ref 70–99)
Potassium: 3.8 mmol/L (ref 3.5–5.1)
Sodium: 137 mmol/L (ref 135–145)

## 2020-01-17 LAB — CBC
HCT: 42.1 % (ref 36.0–46.0)
Hemoglobin: 13.5 g/dL (ref 12.0–15.0)
MCH: 33.4 pg (ref 26.0–34.0)
MCHC: 32.1 g/dL (ref 30.0–36.0)
MCV: 104.2 fL — ABNORMAL HIGH (ref 80.0–100.0)
Platelets: 273 10*3/uL (ref 150–400)
RBC: 4.04 MIL/uL (ref 3.87–5.11)
RDW: 12.4 % (ref 11.5–15.5)
WBC: 6 10*3/uL (ref 4.0–10.5)
nRBC: 0 % (ref 0.0–0.2)

## 2020-01-17 LAB — TROPONIN I (HIGH SENSITIVITY): Troponin I (High Sensitivity): 7 ng/L (ref <18)

## 2020-01-17 MED ORDER — IOPAMIDOL (ISOVUE-370) INJECTION 76%
75.0000 mL | Freq: Once | INTRAVENOUS | Status: AC | PRN
  Administered 2020-01-17: 75 mL via INTRAVENOUS

## 2020-01-17 NOTE — ED Provider Notes (Signed)
  Face-to-face evaluation   History: She is here for transient symptoms which occurred after she had a CT with IV contrast today.  Physical exam: Alert, lucid, calm cooperative.  No respiratory distress.  No dysarthria or aphasia.  Medical screening examination/treatment/procedure(s) were conducted as a shared visit with non-physician practitioner(s) and myself.  I personally evaluated the patient during the encounter    Daleen Bo, MD 01/17/20 2351

## 2020-01-17 NOTE — ED Provider Notes (Signed)
[facility] EMERGENCY DEPARTMENT Provider Note   CSN: [id number] Arrival date & time: 01/17/20  1332     History Chief Complaint  Patient presents with  . Chest Pain    Melinda [name] is a 85 y.o. female.  HPI Patient is an 85 year old female with past medical history of reflux, bilateral breast cancer, anemia, allergies, HLD, HTN, Takotsubo cardiomyopathy, atrial fibrillation on anticoagulation Followed by Croitoru MD cardiology.   Patient brought to the emergency department today because of chest pressure that she felt approximately 5 minutes after she had CT angiography of the head done. This imaging was done because of Jacksonian seizure that she has been having she was assessed by neurology who ordered this study. She states that she has never had a CT with contrast ever done before. She states that she felt short of breath and that she was increasing her respiratory rate and that she could hear herself breathing however she states she was not heavily wheezing. She states that the chest pressure and shortness of breath lasted for less than 10 minutes and resolved without any intervention.   She denies any nausea, radiation of chest pain, vomiting, diaphoresis, weakness, lightheadedness or dizziness during this episode. She states she felt somewhat anxious but did not feel anxious until after her symptoms began. She has no history of anaphylaxis and states that the technicians evaluated her entire body for rashes and found nothing. She states she had no nausea vomiting or diarrhea.  She has had no recurrence of her symptoms.    Past Medical History:  Diagnosis Date  . Allergy   . Anemia 1984   before hysterectomy  . Breast cancer ([facility] [facility])    bilateral  . GERD (gastroesophageal reflux disease)   . History of blood product transfusion 1984  . Hyperlipidemia   . Hypertension   . Macular degeneration    right eye  . Myocardial infarction ([place])   .  Osteopenia   . Personal history of radiation therapy   . Takotsubo cardiomyopathy     Patient Active Problem List   Diagnosis Date Noted  . Syncope 03/04/2019  . Alcohol abuse 03/04/2019  . Fall at home, initial encounter 03/04/2019  . Vasovagal syncope 12/31/2017  . Long term current use of anticoagulant 11/26/2015  . Allergic rhinitis due to pollen 11/23/2015  . Asthmatic bronchitis 11/23/2015  . History of colon polyps 11/23/2015  . Other specified disorders of bone density and structure, other site 11/23/2015  . Glaucoma 11/23/2015  . Mood disorder ([place]) 11/23/2015  . Vitamin D deficiency 11/23/2015  . Paroxysmal atrial fibrillation ([place]) 12/08/2013  . Paroxysmal A-fib ([place]) 12/08/2013  . CAD (coronary artery disease) - 75% ostial OM1 10/21/2013  . Takotsubo cardiomyopathy   . NSTEMI (non-ST elevated myocardial infarction) ([place]) 07/25/2013  . Chest pain 09/09/2012  . GERD (gastroesophageal reflux disease) 09/09/2012  . Essential hypertension 09/09/2012  . Hypercholesterolemia 09/09/2012  . hx: breast cancer, left, invasive ductal carcinoma, right, DCIS 02/19/2011    Past Surgical History:  Procedure Laterality Date  . ABDOMINAL HYSTERECTOMY  1984  . APPENDECTOMY    . BREAST BIOPSY Right 04/29/05   right   . BREAST BIOPSY Left 2006  . BREAST LUMPECTOMY Left 04/19/04   left with sentinel node  . BREAST LUMPECTOMY Right 2007  . CATARACT EXTRACTION     bilateral  . EYE SURGERY    . LEFT HEART CATHETERIZATION WITH CORONARY ANGIOGRAM N/A 07/26/2013   Procedure: LEFT HEART CATHETERIZATION  WITH CORONARY ANGIOGRAM;  Surgeon: Jettie Booze, MD;  Location: [facility] CATH LAB;  Service: Cardiovascular;  Laterality: N/A;  . RETINAL LASER PROCEDURE     right     OB History   No obstetric history on file.     Family History  Problem Relation Age of Onset  . Dementia Mother   . Stroke Mother   . Heart disease Mother 13  . Cancer Father        Prostate  . Heart disease  Father 38  . Cancer Maternal Aunt        Breast  . Breast cancer Maternal Aunt   . Cancer Maternal Grandmother   . Breast cancer Maternal Grandmother   . Heart attack Maternal Grandfather   . Heart attack Paternal Grandfather     Social History   Tobacco Use  . Smoking status: Former Smoker    Types: Cigarettes    Quit date: 1990    Years since quitting: 31.8  . Smokeless tobacco: Never Used  Vaping Use  . Vaping Use: Never used  Substance Use Topics  . Alcohol use: Yes    Comment: 3 drinks per night, "too much"  . Drug use: No    Home Medications Prior to Admission medications   Medication Sig Start Date End Date Taking? Authorizing Provider  acetaminophen (TYLENOL) 500 MG tablet Take 1,000 mg by mouth at bedtime as needed (for pain, headaches, or fever).     [provider]  albuterol (PROAIR HFA) 108 (90 Base) MCG/ACT inhaler Inhale 1-2 puffs into the lungs every 6 (six) hours as needed for wheezing or shortness of breath.    [provider]  atorvastatin (LIPITOR) 20 MG tablet Take 20 mg by mouth at bedtime.     [provider]  cholecalciferol (VITAMIN D) 1000 units tablet Take 1,000 Units by mouth daily.    [provider]  ELIQUIS 5 MG TABS tablet Take 5 mg by mouth 2 (two) times daily. 09/26/19   [provider]  fexofenadine (ALLEGRA) 180 MG tablet Take 180 mg by mouth daily.     [provider]  latanoprost (XALATAN) 0.005 % ophthalmic solution Place 1 drop into both eyes at bedtime.  05/18/19   [provider]  losartan (COZAAR) 50 MG tablet Take 100 mg by mouth daily. 09/02/19   [provider]  metoprolol succinate (TOPROL-XL) 25 MG 24 hr tablet Take 25 mg by mouth daily. 09/23/19   [provider]  nitroGLYCERIN (NITROSTAT) 0.4 MG SL tablet Place 1 tablet (0.4 mg total) under the tongue every 5 (five) minutes as needed for chest pain. 07/13/19   Croitoru, Mihai, MD  sertraline (ZOLOFT) 25  MG tablet Take 25 mg by mouth daily. 09/05/19   [provider]  timolol (TIMOPTIC) 0.5 % ophthalmic solution Place 1 drop into the right eye every morning.  01/31/19   [provider]    Allergies    Ibuprofen, Nsaids, Nsaids, Salicylates, Aspirin, Hctz [hydrochlorothiazide], Salicylates, Codeine, Etodolac, and Hydrochlorothiazide  Review of Systems   Review of Systems  Constitutional: Negative for chills and fever.  HENT: Negative for congestion.   Eyes: Negative for pain.  Respiratory: Positive for shortness of breath. Negative for cough.   Cardiovascular: Positive for chest pain. Negative for leg swelling.  Gastrointestinal: Negative for abdominal distention, abdominal pain and vomiting.  Genitourinary: Negative for dysuria.  Musculoskeletal: Negative for myalgias.  Skin: Negative for rash.  Neurological: Negative for dizziness and  headaches.    Physical Exam Updated Vital Signs BP (!) 177/58   Pulse 64   Temp 97.6 F (36.4 C) (Oral)   Resp (!) 24   Ht 5\' 2"  (1.575 m)   Wt 73.5 kg   SpO2 98%   BMI 29.63 kg/m   Physical Exam Vitals and nursing note reviewed.  Constitutional:      General: She is not in acute distress. HENT:     Head: Normocephalic and atraumatic.     Nose: Nose normal.  Eyes:     General: No scleral icterus. Cardiovascular:     Rate and Rhythm: Normal rate and regular rhythm.     Pulses: Normal pulses.     Heart sounds: Normal heart sounds.     Comments: RRR, no MRG Bilateral radial pulses 3+ Pulmonary:     Effort: Pulmonary effort is normal. No respiratory distress.     Breath sounds: Normal breath sounds. No wheezing.  Abdominal:     Palpations: Abdomen is soft.     Tenderness: There is no abdominal tenderness.  Musculoskeletal:     Cervical back: Normal range of motion.     Right lower leg: No edema.     Left lower leg: No edema.  Skin:    General: Skin is warm and dry.     Capillary Refill: Capillary refill takes less  than 2 seconds.  Neurological:     Mental Status: She is alert. Mental status is at baseline.  Psychiatric:        Mood and Affect: Mood normal.        Behavior: Behavior normal.     ED Results / Procedures / Treatments   Labs (all labs ordered are listed, but only abnormal results are displayed) Labs Reviewed  BASIC METABOLIC PANEL - Abnormal; Notable for the following components:      Result Value   Glucose, Bld 102 (*)    All other components within normal limits  CBC - Abnormal; Notable for the following components:   MCV 104.2 (*)    All other components within normal limits  TROPONIN I (HIGH SENSITIVITY)    EKG EKG Interpretation  Date/Time:  Tuesday January 17 2020 13:29:42 EDT Ventricular Rate:  67 PR Interval:  166 QRS Duration: 96 QT Interval:  422 QTC Calculation: 445 R Axis:   -29 Text Interpretation: Normal sinus rhythm Possible Left atrial enlargement Left ventricular hypertrophy with repolarization abnormality ( R in aVL , Cornell product ) Cannot rule out Septal infarct , age undetermined Abnormal ECG since last tracing no significant change Confirmed by Daleen Bo [phone]) on 01/17/2020 6:34:58 PM   Radiology CT ANGIO HEAD W OR WO CONTRAST  Result Date: 01/17/2020 CLINICAL DATA:  Transit ischemic attack. EXAM: CT ANGIOGRAPHY HEAD TECHNIQUE: Multidetector CT imaging of the head was performed using the standard protocol during bolus administration of intravenous contrast. Multiplanar CT image reconstructions and MIPs were obtained to evaluate the vascular anatomy. CONTRAST:  34mL ISOVUE-370 IOPAMIDOL (ISOVUE-370) INJECTION 76% COMPARISON:  03/19/2019 CT and MRI head. FINDINGS: CT HEAD Brain: No acute infarct or intracranial hemorrhage. No mass lesion. No midline shift, ventriculomegaly or extra-axial fluid collection. Mild diffuse cerebral atrophy with ex vacuo dilatation. Chronic microvascular ischemic changes. Bilateral basal ganglia calcifications.  Vascular: No hyperdense vessel or unexpected calcification. Bilateral carotid siphon and V4 segment atherosclerotic calcifications. Skull: Negative for fracture or focal lesion. Sinuses/Orbits: Bilateral lens replacement. Clear paranasal sinuses and mastoid air cells. Other: None. CTA HEAD Anterior circulation:  No high-grade stenosis, proximal occlusion, aneurysm, or vascular malformation. Patent bilateral ICAs. Carotid siphon atherosclerotic calcifications with mild bilateral cavernous ICA narrowing. Patent anterior and middle cerebral arteries. Posterior circulation: No high-grade stenosis, proximal occlusion, aneurysm, or vascular malformation. Dominant left vertebral artery. Bilateral V4 segment atherosclerotic calcifications with no greater than mild luminal narrowing. Patent PICA. Patent basilar and superior cerebellar arteries. Patent posterior cerebral arteries. Venous sinuses: As permitted by contrast timing, patent. Anatomic variants: None. IMPRESSION: Head CT: No acute intracranial process. Age-related cerebral atrophy and chronic microvascular ischemic changes. Head CTA: Carotid siphon and V4 segment atheromatous disease with no greater than mild luminal narrowing. No large vessel occlusion, high-grade narrowing, aneurysm or dissection. Electronically Signed   By: Primitivo Gauze M.D.   On: 01/17/2020 14:43   DG Chest 2 View  Result Date: 01/17/2020 CLINICAL DATA:  Chest pain, headache. EXAM: CHEST - 2 VIEW COMPARISON:  03/04/2019 chest radiograph and prior. FINDINGS: Right basilar atelectasis. No pneumothorax or pleural effusion. Cardiomediastinal silhouette within normal limits. Indwelling loop recorder. Multilevel spondylosis. IMPRESSION: No focal airspace disease.  Right basilar atelectasis. Electronically Signed   By: Primitivo Gauze M.D.   On: 01/17/2020 14:23    Procedures Procedures (including critical care time)  Medications Ordered in ED Medications - No data to  display  ED Course  I have reviewed the triage vital signs and the nursing notes.  Pertinent labs & imaging results that were available during my care of the patient were reviewed by me and considered in my medical decision making (see chart for details).    MDM Rules/Calculators/A&P                          Patient is 85 year old female past medical history detailed above presented today for less than 10 minutes of chest pain or shortness of breath that occurred after she had a CT contrast study done of her brain.  She has had no recurrence of the symptoms and she was in the ER waiting room for approximately 5 hours before my evaluation. She has had no other symptoms. Physical exam is reassuring. Her BMP is without electrolyte abnormality CBC without leukocytosis or anemia. Troponin x1 within normal limits. DG chest without any acute normality some right-sided basilar atelectasis visualized. EKG is unchanged from prior.  Plan is to discharge patient with follow-up with her primary care doctor and return precautions to the emergency department should she have any new or concerning symptoms. I explicitly told patient that I would not call this a contrast allergy given that she had no significant symptoms of it and it resolved without any intervention at all. Suspect that this was either anxiety mediated or a reaction to contrast that is normal and nonlethal. I did recommend that she discuss this episode with the next provider who ordered the contrast study however I do not believe that she would need any premedication.  I discussed this case with my attending physician who cosigned this note including patient's presenting symptoms, physical exam, and planned diagnostics and interventions. Attending physician stated agreement with plan or made changes to plan which were implemented.   Attending physician assessed patient at bedside.  Patient discharged from emergency room. She is agreeable to  plan understanding of follow-up.  Final Clinical Impression(s) / ED Diagnoses Final diagnoses:  Chest pain, unspecified type  Atypical chest pain    Rx / DC Orders ED Discharge Orders    None  Pati Gallo [place], [place] 01/17/20 Docia Chuck    Daleen Bo, MD 01/17/20 2351

## 2020-01-17 NOTE — ED Triage Notes (Signed)
Patient arrives to ED with complaints of sudden headache, chest pain and shortness of breath after getting a CTA with contrast today. In triage pts CP now 0/10. Received no meds PTA with EMS.

## 2020-01-17 NOTE — Discharge Instructions (Signed)
Please follow-up with your primary care doctor.  Return to the emergency department for any new or concerning symptoms.

## 2020-01-19 ENCOUNTER — Telehealth: Payer: Self-pay | Admitting: Neurology

## 2020-01-19 NOTE — Telephone Encounter (Signed)
I called the patient.  CT angiogram of the head was relatively unremarkable, no source of TIA.  If the episodes of hand and face numbness recur, will give an empiric trial on Keppra.   CTA head 01/17/20:  IMPRESSION: Head CT:  No acute intracranial process.  Age-related cerebral atrophy and chronic microvascular ischemic changes.  Head CTA:  Carotid siphon and V4 segment atheromatous disease with no greater than mild luminal narrowing.  No large vessel occlusion, high-grade narrowing, aneurysm or dissection.

## 2020-01-30 DIAGNOSIS — H26491 Other secondary cataract, right eye: Secondary | ICD-10-CM | POA: Diagnosis not present

## 2020-01-31 DIAGNOSIS — Z7901 Long term (current) use of anticoagulants: Secondary | ICD-10-CM | POA: Diagnosis not present

## 2020-01-31 DIAGNOSIS — R040 Epistaxis: Secondary | ICD-10-CM | POA: Diagnosis not present

## 2020-02-03 LAB — CUP PACEART REMOTE DEVICE CHECK
Date Time Interrogation Session: 20211101230350
Implantable Pulse Generator Implant Date: 20210111

## 2020-02-06 ENCOUNTER — Ambulatory Visit (INDEPENDENT_AMBULATORY_CARE_PROVIDER_SITE_OTHER): Payer: Medicare Other

## 2020-02-06 DIAGNOSIS — R55 Syncope and collapse: Secondary | ICD-10-CM

## 2020-02-07 ENCOUNTER — Telehealth: Payer: Self-pay | Admitting: Cardiovascular Disease

## 2020-02-07 NOTE — Telephone Encounter (Signed)
*  STAT* If patient is at the pharmacy, call can be transferred to refill team.   1. Which medications need to be refilled? (please list name of each medication and dose if known) a new prescription for Losartan 100mg   2. Which pharmacy/location (including street and city if local pharmacy) is medication to be sent to [facility], [place], [place]   3. Do they need a 30 day or 90 day supply? 90 days and refils

## 2020-02-07 NOTE — Progress Notes (Signed)
Carelink Summary Report / Loop Recorder 

## 2020-02-09 MED ORDER — LOSARTAN POTASSIUM 50 MG PO TABS
100.0000 mg | ORAL_TABLET | Freq: Every day | ORAL | 3 refills | Status: DC
Start: 2020-02-09 — End: 2021-02-04

## 2020-02-10 NOTE — Telephone Encounter (Signed)
Pt called in and stated this med was increased within the last month.  She was taking 2 per day 100mg .  She stated that is why she is complete out of this med   Best number  [phone]

## 2020-02-10 NOTE — Telephone Encounter (Signed)
Rx has been sent to the pharmacy electronically. ° °

## 2020-02-29 ENCOUNTER — Other Ambulatory Visit: Payer: Self-pay | Admitting: Cardiovascular Disease

## 2020-03-05 DIAGNOSIS — I119 Hypertensive heart disease without heart failure: Secondary | ICD-10-CM | POA: Diagnosis not present

## 2020-03-11 LAB — CUP PACEART REMOTE DEVICE CHECK
Date Time Interrogation Session: 20211204230314
Implantable Pulse Generator Implant Date: 20210111

## 2020-03-12 ENCOUNTER — Ambulatory Visit (INDEPENDENT_AMBULATORY_CARE_PROVIDER_SITE_OTHER): Payer: Medicare Other

## 2020-03-12 DIAGNOSIS — R55 Syncope and collapse: Secondary | ICD-10-CM

## 2020-03-27 NOTE — Progress Notes (Signed)
Carelink Summary Report / Loop Recorder 

## 2020-04-02 DIAGNOSIS — H401131 Primary open-angle glaucoma, bilateral, mild stage: Secondary | ICD-10-CM | POA: Diagnosis not present

## 2020-04-02 DIAGNOSIS — H04123 Dry eye syndrome of bilateral lacrimal glands: Secondary | ICD-10-CM | POA: Diagnosis not present

## 2020-04-02 DIAGNOSIS — H16223 Keratoconjunctivitis sicca, not specified as Sjogren's, bilateral: Secondary | ICD-10-CM | POA: Diagnosis not present

## 2020-04-14 ENCOUNTER — Other Ambulatory Visit: Payer: Self-pay | Admitting: Cardiovascular Disease

## 2020-04-16 ENCOUNTER — Ambulatory Visit (INDEPENDENT_AMBULATORY_CARE_PROVIDER_SITE_OTHER): Payer: Medicare Other

## 2020-04-16 DIAGNOSIS — R55 Syncope and collapse: Secondary | ICD-10-CM | POA: Diagnosis not present

## 2020-04-16 NOTE — Telephone Encounter (Signed)
85 F 76.7 kg SCr 0.57 LOV 7/21 [facility]

## 2020-04-18 LAB — CUP PACEART REMOTE DEVICE CHECK
Date Time Interrogation Session: 20220115230454
Implantable Pulse Generator Implant Date: 20210111

## 2020-04-19 ENCOUNTER — Ambulatory Visit (INDEPENDENT_AMBULATORY_CARE_PROVIDER_SITE_OTHER): Payer: Medicare Other | Admitting: Ophthalmology

## 2020-04-19 ENCOUNTER — Other Ambulatory Visit: Payer: Self-pay

## 2020-04-19 ENCOUNTER — Encounter (INDEPENDENT_AMBULATORY_CARE_PROVIDER_SITE_OTHER): Payer: Self-pay | Admitting: Ophthalmology

## 2020-04-19 DIAGNOSIS — H353212 Exudative age-related macular degeneration, right eye, with inactive choroidal neovascularization: Secondary | ICD-10-CM

## 2020-04-19 DIAGNOSIS — Z961 Presence of intraocular lens: Secondary | ICD-10-CM

## 2020-04-19 DIAGNOSIS — Z9889 Other specified postprocedural states: Secondary | ICD-10-CM | POA: Diagnosis not present

## 2020-04-19 DIAGNOSIS — H401131 Primary open-angle glaucoma, bilateral, mild stage: Secondary | ICD-10-CM | POA: Diagnosis not present

## 2020-04-19 DIAGNOSIS — H43812 Vitreous degeneration, left eye: Secondary | ICD-10-CM | POA: Diagnosis not present

## 2020-04-19 DIAGNOSIS — H353132 Nonexudative age-related macular degeneration, bilateral, intermediate dry stage: Secondary | ICD-10-CM

## 2020-04-19 NOTE — Assessment & Plan Note (Signed)
Follow-up with Dr. Cori Razor as scheduled

## 2020-04-19 NOTE — Assessment & Plan Note (Signed)
No active disease OD 

## 2020-04-19 NOTE — Assessment & Plan Note (Signed)

## 2020-04-19 NOTE — Progress Notes (Signed)
04/19/2020     CHIEF COMPLAINT Patient presents for Retina Follow Up (1 Year AMD F/U OU//Pt denies noticeable changes to [place] OU since last visit. Pt denies ocular pain, flashes of light, or floaters OU. //)   HISTORY OF PRESENT ILLNESS: Melinda [name] is a 86 y.o. female who presents to the clinic today for:   HPI    Retina Follow Up    Patient presents with  Dry AMD.  In both eyes.  This started 1 year ago.  Severity is mild.  Duration of 1 year.  Since onset it is stable. Additional comments: 1 Year AMD F/U OU  Pt denies noticeable changes to [place] OU since last visit. Pt denies ocular pain, flashes of light, or floaters OU.          Last edited by Rockie Neighbours, [place] on 04/19/2020  1:23 PM. (History)      Referring physician: [name], MD [address],  [place] [address]  HISTORICAL INFORMATION:   Selected notes from the MEDICAL RECORD NUMBER    Lab Results  Component Value Date   HGBA1C 5.7 (H) 07/25/2013     CURRENT MEDICATIONS: Current Outpatient Medications (Ophthalmic Drugs)  Medication Sig  . latanoprost (XALATAN) 0.005 % ophthalmic solution Place 1 drop into both eyes at bedtime.   . timolol (TIMOPTIC) 0.5 % ophthalmic solution Place 1 drop into the right eye every morning.    No current facility-administered medications for this visit. (Ophthalmic Drugs)   Current Outpatient Medications (Other)  Medication Sig  . acetaminophen (TYLENOL) 500 MG tablet Take 1,000 mg by mouth at bedtime as needed (for pain, headaches, or fever).   Marland Kitchen albuterol (PROAIR HFA) 108 (90 Base) MCG/ACT inhaler Inhale 1-2 puffs into the lungs every 6 (six) hours as needed for wheezing or shortness of breath.  Marland Kitchen atorvastatin (LIPITOR) 20 MG tablet Take 20 mg by mouth at bedtime.   . cholecalciferol (VITAMIN D) 1000 units tablet Take 1,000 Units by mouth daily.  Marland Kitchen ELIQUIS 5 MG TABS tablet TAKE ONE TABLET BY MOUTH TWICE A DAY  . fexofenadine (ALLEGRA) 180  MG tablet Take 180 mg by mouth daily.   Marland Kitchen losartan (COZAAR) 50 MG tablet Take 2 tablets (100 mg total) by mouth daily.  . metoprolol succinate (TOPROL-XL) 25 MG 24 hr tablet TAKE ONE TABLET BY MOUTH DAILY  . nitroGLYCERIN (NITROSTAT) 0.4 MG SL tablet Place 1 tablet (0.4 mg total) under the tongue every 5 (five) minutes as needed for chest pain.  Marland Kitchen sertraline (ZOLOFT) 25 MG tablet Take 25 mg by mouth daily.   Current Facility-Administered Medications (Other)  Medication Route  . lidocaine-EPINEPHrine (XYLOCAINE W/EPI) 1 %-1:100000 (with pres) injection 10 mL Infiltration      REVIEW OF SYSTEMS:    ALLERGIES Allergies  Allergen Reactions  . Nsaids Hives, Shortness Of Breath and Other (See Comments)    Extreme hives and difficulty breathing. "Has had to go to the ER."  . Salicylates Other (See Comments)    (aspirin) Extreme hives and difficulty breathing. "Has had to go to the ER."  . Hctz [Hydrochlorothiazide] Other (See Comments)    unknown  . Codeine Nausea Only  . Etodolac Rash  . Hydrochlorothiazide Other (See Comments)    Hyponatremia    PAST MEDICAL HISTORY Past Medical History:  Diagnosis Date  . Allergy   . Anemia 1984   before hysterectomy  . Breast cancer ([place])    bilateral  .  GERD (gastroesophageal reflux disease)   . History of blood product transfusion 1984  . Hyperlipidemia   . Hypertension   . Macular degeneration    right eye  . Myocardial infarction (HCC)   . Osteopenia   . Personal history of radiation therapy   . Takotsubo cardiomyopathy    Past Surgical History:  Procedure Laterality Date  . ABDOMINAL HYSTERECTOMY  1984  . APPENDECTOMY    . BREAST BIOPSY Right 04/29/05   right   . BREAST BIOPSY Left 2006  . BREAST LUMPECTOMY Left 04/19/04   left with sentinel node  . BREAST LUMPECTOMY Right 2007  . CATARACT EXTRACTION     bilateral  . EYE SURGERY    . LEFT HEART CATHETERIZATION WITH CORONARY ANGIOGRAM N/A 07/26/2013   Procedure: LEFT  HEART CATHETERIZATION WITH CORONARY ANGIOGRAM;  Surgeon: Corky Crafts, MD;  Location[facility] C[facility] LAB;  Service: Cardiovascular;  Laterality: N/A;  . RETINAL LASER PROCEDURE     right    FAMILY HISTORY Family History  Problem Relation Age of Onset  . Dementia Mother   . Stroke Mother   . Heart disease Mother 69  . Cancer Father        Prostate  . Heart disease Father 27  . Cancer Maternal Aunt        Breast  . Breast cancer Maternal Aunt   . Cancer Maternal Grandmother   . Breast cancer Maternal Grandmother   . Heart attack Maternal Grandfather   . Heart attack Paternal Grandfather     SOCIAL HISTORY Social History   Tobacco Use  . Smoking status: Former Smoker    Types: Cigarettes    Quit date: 1990    Years since quitting: 32.0  . Smokeless tobacco: Never Used  Vaping Use  . Vaping Use: Never used  Substance Use Topics  . Alcohol use: Yes    Comment: 3 drinks per night, "too much"  . Drug use: No         OPHTHALMIC EXAM:  Base Eye Exam    Visual Acuity (ETDRS)      Right Left   Dist cc 20/200 20/25 -2   Dist ph cc 20/30 -2    Correction: Glasses  Pt wearing older glasses, broke newer glasses       Tonometry (Tonopen, 1:23 PM)      Right Left   Pressure 15 15       Pupils      Pupils Dark Light Shape React APD   Right PERRL 4 3 Round Brisk None   Left PERRL 4 3 Round Brisk None       Visual Fields (Counting fingers)      Left Right    Full Full       Extraocular Movement      Right Left    Full Full       Neuro/Psych    Oriented x3: Yes   Mood/Affect: Normal       Dilation    Both eyes: 1.0% Mydriacyl, 2.5% Phenylephrine @ 1:29 PM        Slit Lamp and Fundus Exam    External Exam      Right Left   External Normal Normal       Slit Lamp Exam      Right Left   Lids/Lashes Normal Normal   Conjunctiva/Sclera White and quiet White and quiet   Cornea Clear Clear   Anterior Chamber Deep and quiet Deep and  quiet   Iris  Round and reactive Round and reactive   Lens Posterior chamber intraocular lens Posterior chamber intraocular lens   Anterior Vitreous Normal Normal       Fundus Exam      Right Left   Posterior Vitreous Clear, vitrectomized Normal   Disc Normal Normal   C/D Ratio 0.45 0.2   Macula Hard drusen, Soft drusen, no macular thickening, no hemorrhage, Intermediate age related macular degeneration Hard drusen, Soft drusen, no macular thickening, no hemorrhage, Intermediate age related macular degeneration   Vessels Normal Normal   Periphery Normal Normal          IMAGING AND PROCEDURES  Imaging and Procedures for 04/19/20           ASSESSMENT/PLAN:  Exudative age-related macular degeneration of right eye with inactive choroidal neovascularization (HCC) No active disease OD  Intermediate stage nonexudative age-related macular degeneration of both eyes The nature of age--related macular degeneration was discussed with the patient as well as the distinction between dry and wet types. Checking an Amsler Grid daily with advice to return immediately should a distortion develop, was given to the patient. The patient 's smoking status now and in the past was determined and advice based on the AREDS study was provided regarding the consumption of antioxidant supplements. AREDS 2 vitamin formulation was recommended. Consumption of dark leafy vegetables and fresh fruits of various colors was recommended. Treatment modalities for wet macular degeneration particularly the use of intravitreal injections of anti-blood vessel growth factors was discussed with the patient. Avastin, Lucentis, and Eylea are the available options. On occasion, therapy includes the use of photodynamic therapy and thermal laser. Stressed to the patient do not rub eyes.  Patient was advised to check Amsler Grid daily and return immediately if changes are noted. Instructions on using the grid were given to the patient. All patient  questions were answered.  Primary open angle glaucoma of both eyes, mild stage Follow-up with Dr. Cori Razor as scheduled      ICD-10-CM   1. Exudative age-related macular degeneration of right eye with inactive choroidal neovascularization ([place])  H35.3212   2. Intermediate stage nonexudative age-related macular degeneration of both eyes  H35.3132   3. Pseudophakia  Z96.1   4. Posterior vitreous detachment of left eye  H43.812   5. History of vitrectomy  Z98.890   6. Primary open angle glaucoma of both eyes, mild stage  H40.1131     1.  OU looks great, with inactive dry age-related macular degeneration and preservation of good acuity  2.  History of vitrectomy for macular pucker and epiretinal membrane, hole OD, stable with good acuity  3.  Ophthalmic Meds Ordered this visit:  No orders of the defined types were placed in this encounter.      Return in about 1 year (around 04/19/2021) for DILATE OU, OCT, COLOR FP.  There are no Patient Instructions on file for this visit.   Explained the diagnoses, plan, and follow up with the patient and they expressed understanding.  Patient expressed understanding of the importance of proper follow up care.   Clent Demark Ailis Rigaud M.D. Diseases & Surgery of the Retina and Vitreous [facility]ina [facility] [place] 04/19/20     Abbreviations: M myopia (nearsighted); A astigmatism; H hyperopia (farsighted); P presbyopia; Mrx spectacle prescription;  CTL contact lenses; OD right eye; OS left eye; OU both eyes  XT exotropia; ET esotropia; PEK punctate epithelial keratitis; PEE punctate epithelial erosions; DES dry eye  syndrome; MGD meibomian gland dysfunction; ATs artificial tears; PFAT's preservative free artificial tears; Bells nuclear sclerotic cataract; PSC posterior subcapsular cataract; ERM epi-retinal membrane; PVD posterior vitreous detachment; RD retinal detachment; DM diabetes mellitus; DR diabetic retinopathy; NPDR non-proliferative diabetic  retinopathy; PDR proliferative diabetic retinopathy; CSME clinically significant macular edema; DME diabetic macular edema; dbh dot blot hemorrhages; CWS cotton wool spot; POAG primary open angle glaucoma; C/D cup-to-disc ratio; HVF humphrey visual field; GVF goldmann visual field; OCT optical coherence tomography; IOP intraocular pressure; BRVO Branch retinal vein occlusion; CRVO central retinal vein occlusion; CRAO central retinal artery occlusion; BRAO branch retinal artery occlusion; RT retinal tear; SB scleral buckle; PPV pars plana vitrectomy; VH Vitreous hemorrhage; PRP panretinal laser photocoagulation; IVK intravitreal kenalog; VMT vitreomacular traction; MH Macular hole;  NVD neovascularization of the disc; NVE neovascularization elsewhere; AREDS age related eye disease study; ARMD age related macular degeneration; POAG primary open angle glaucoma; EBMD epithelial/anterior basement membrane dystrophy; ACIOL anterior chamber intraocular lens; IOL intraocular lens; PCIOL posterior chamber intraocular lens; Phaco/IOL phacoemulsification with intraocular lens placement; [place] photorefractive keratectomy; LASIK laser assisted in situ keratomileusis; HTN hypertension; DM diabetes mellitus; COPD chronic obstructive pulmonary disease

## 2020-04-26 DIAGNOSIS — Z23 Encounter for immunization: Secondary | ICD-10-CM | POA: Diagnosis not present

## 2020-05-01 NOTE — Progress Notes (Signed)
Carelink Summary Report / Loop Recorder 

## 2020-05-10 ENCOUNTER — Other Ambulatory Visit: Payer: Self-pay | Admitting: Family Medicine

## 2020-05-10 DIAGNOSIS — Z1231 Encounter for screening mammogram for malignant neoplasm of breast: Secondary | ICD-10-CM

## 2020-05-21 ENCOUNTER — Ambulatory Visit (INDEPENDENT_AMBULATORY_CARE_PROVIDER_SITE_OTHER): Payer: Medicare Other

## 2020-05-21 DIAGNOSIS — R55 Syncope and collapse: Secondary | ICD-10-CM | POA: Diagnosis not present

## 2020-05-23 LAB — CUP PACEART REMOTE DEVICE CHECK
Date Time Interrogation Session: 20220217230550
Implantable Pulse Generator Implant Date: 20210111

## 2020-05-25 NOTE — Progress Notes (Signed)
Carelink Summary Report / Loop Recorder 

## 2020-05-31 ENCOUNTER — Encounter: Payer: Self-pay | Admitting: Neurology

## 2020-05-31 ENCOUNTER — Ambulatory Visit (INDEPENDENT_AMBULATORY_CARE_PROVIDER_SITE_OTHER): Payer: Medicare Other | Admitting: Neurology

## 2020-05-31 ENCOUNTER — Other Ambulatory Visit: Payer: Self-pay

## 2020-05-31 DIAGNOSIS — R200 Anesthesia of skin: Secondary | ICD-10-CM | POA: Diagnosis not present

## 2020-05-31 NOTE — Progress Notes (Signed)
Reason for visit: Transient sensory alterations  Melinda [name] is an 86 y.o. female  History of present illness:  Melinda [name] is an 86 year old right-handed white female with a history of atrial fibrillation.  The patient has had multiple stereotypical events of the right hand numbness that spread up the arm to include the face and tongue.  This is unassociated with weakness or clumsiness or dystonic posturing.  The patient underwent a CT angiogram of the head and neck that did not reveal any evidence of significant large or medium arterial blockages.  The patient has not had any further events since October 2021.  She has done quite well in this regard.  She reports no other new significant medical issues that have come up since last seen.  Past Medical History:  Diagnosis Date  . Allergy   . Anemia 1984   before hysterectomy  . Breast cancer ([place])    bilateral  . GERD (gastroesophageal reflux disease)   . History of blood product transfusion 1984  . Hyperlipidemia   . Hypertension   . Macular degeneration    right eye  . Myocardial infarction ([place])   . Osteopenia   . Personal history of radiation therapy   . Takotsubo cardiomyopathy     Past Surgical History:  Procedure Laterality Date  . ABDOMINAL HYSTERECTOMY  1984  . APPENDECTOMY    . BREAST BIOPSY Right 04/29/05   right   . BREAST BIOPSY Left 2006  . BREAST LUMPECTOMY Left 04/19/04   left with sentinel node  . BREAST LUMPECTOMY Right 2007  . CATARACT EXTRACTION     bilateral  . EYE SURGERY    . LEFT HEART CATHETERIZATION WITH CORONARY ANGIOGRAM N/A 07/26/2013   Procedure: LEFT HEART CATHETERIZATION WITH CORONARY ANGIOGRAM;  Surgeon: Jettie Booze, MD;  Location: [facility] CATH LAB;  Service: Cardiovascular;  Laterality: N/A;  . RETINAL LASER PROCEDURE     right    Family History  Problem Relation Age of Onset  . Dementia Mother   . Stroke Mother   . Heart disease Mother 38  . Cancer Father         Prostate  . Heart disease Father 13  . Cancer Maternal Aunt        Breast  . Breast cancer Maternal Aunt   . Cancer Maternal Grandmother   . Breast cancer Maternal Grandmother   . Heart attack Maternal Grandfather   . Heart attack Paternal Grandfather     Social history:  reports that she quit smoking about 32 years ago. Her smoking use included cigarettes. She has never used smokeless tobacco. She reports current alcohol use. She reports that she does not use drugs.    Allergies  Allergen Reactions  . Nsaids Hives, Shortness Of Breath and Other (See Comments)    Extreme hives and difficulty breathing. "Has had to go to the ER."  . Salicylates Other (See Comments)    (aspirin) Extreme hives and difficulty breathing. "Has had to go to the ER."  . Hctz [Hydrochlorothiazide] Other (See Comments)    unknown  . Codeine Nausea Only  . Etodolac Rash  . Hydrochlorothiazide Other (See Comments)    Hyponatremia    Medications:  Prior to Admission medications   Medication Sig Start Date End Date Taking? Authorizing Provider  acetaminophen (TYLENOL) 500 MG tablet Take 1,000 mg by mouth at bedtime as needed (for pain, headaches, or fever).    Yes [provider]  albuterol (VENTOLIN  HFA) 108 (90 Base) MCG/ACT inhaler Inhale 1-2 puffs into the lungs every 6 (six) hours as needed for wheezing or shortness of breath.   Yes [provider]  atorvastatin (LIPITOR) 20 MG tablet Take 20 mg by mouth at bedtime.   Yes [provider]  cholecalciferol (VITAMIN D) 1000 units tablet Take 1,000 Units by mouth daily.   Yes [provider]  ELIQUIS 5 MG TABS tablet TAKE ONE TABLET BY MOUTH TWICE A DAY 04/16/20  Yes Croitoru, Mihai, MD  fexofenadine (ALLEGRA) 180 MG tablet Take 180 mg by mouth daily.   Yes [provider]  latanoprost (XALATAN) 0.005 % ophthalmic solution Place 1 drop into both eyes at bedtime.  05/18/19  Yes [provider]  losartan  (COZAAR) 50 MG tablet Take 2 tablets (100 mg total) by mouth daily. 02/09/20  Yes Croitoru, Mihai, MD  metoprolol succinate (TOPROL-XL) 25 MG 24 hr tablet TAKE ONE TABLET BY MOUTH DAILY 02/29/20  Yes Croitoru, Mihai, MD  nitroGLYCERIN (NITROSTAT) 0.4 MG SL tablet Place 1 tablet (0.4 mg total) under the tongue every 5 (five) minutes as needed for chest pain. 07/13/19  Yes Croitoru, Mihai, MD  sertraline (ZOLOFT) 25 MG tablet Take 25 mg by mouth daily. 09/05/19  Yes [provider]  timolol (TIMOPTIC) 0.5 % ophthalmic solution Place 1 drop into the right eye every morning.  01/31/19  Yes [provider]    ROS:  Out of a complete 14 system review of symptoms, the patient complains only of the following symptoms, and all other reviewed systems are negative.  Slight balance problems Transient numbness  Blood pressure (!) 194/79, pulse 83, height 5\' 2"  (1.575 m), weight 158 lb (71.7 kg).  Physical Exam  General: The patient is alert and cooperative at the time of the examination.  Skin: No significant peripheral edema is noted.   Neurologic Exam  Mental status: The patient is alert and oriented x 3 at the time of the examination. The patient has apparent normal recent and remote memory, with an apparently normal attention span and concentration ability.   Cranial nerves: Facial symmetry is present. Speech is normal, no aphasia or dysarthria is noted. Extraocular movements are full. Visual fields are full.  Motor: The patient has good strength in all 4 extremities.  Sensory examination: Soft touch sensation is symmetric on the face, arms, and legs.  Coordination: The patient has good finger-nose-finger and heel-to-shin bilaterally.  Gait and station: The patient has a normal gait, but she usually uses a cane for ambulation. Tandem gait is slightly unsteady. Romberg is negative. No drift is seen.  Reflexes: Deep tendon reflexes are symmetric.   CTA head  01/17/20:  IMPRESSION: Head CT:  No acute intracranial process.  Age-related cerebral atrophy and chronic microvascular ischemic changes.  Head CTA:  Carotid siphon and V4 segment atheromatous disease with no greater than mild luminal narrowing.  No large vessel occlusion, high-grade narrowing, aneurysm or dissection.   Assessment/Plan:  1.  Transient right sensory changes  The patient has a Jacksonian type March with the sensory changes that suggest that the events represent either a sensory seizure or a migraine equivalent event.  There is no evidence of heightened cerebrovascular risk.  At this point, the patient is no longer having her episodes.  If the spells continue, I may consider empiric trial on anticonvulsant medication.  Otherwise, she will follow up here on an as-needed basis.   Jill Alexanders MD 05/31/2020 3:02 PM  [facility] [address] [place], [place] [address]  Phone [phone] Fax [phone]

## 2020-06-14 DIAGNOSIS — I119 Hypertensive heart disease without heart failure: Secondary | ICD-10-CM | POA: Diagnosis not present

## 2020-06-14 DIAGNOSIS — E7800 Pure hypercholesterolemia, unspecified: Secondary | ICD-10-CM | POA: Diagnosis not present

## 2020-06-14 DIAGNOSIS — D6869 Other thrombophilia: Secondary | ICD-10-CM | POA: Diagnosis not present

## 2020-06-14 DIAGNOSIS — I4891 Unspecified atrial fibrillation: Secondary | ICD-10-CM | POA: Diagnosis not present

## 2020-06-14 DIAGNOSIS — R7303 Prediabetes: Secondary | ICD-10-CM | POA: Diagnosis not present

## 2020-06-14 DIAGNOSIS — M8588 Other specified disorders of bone density and structure, other site: Secondary | ICD-10-CM | POA: Diagnosis not present

## 2020-06-24 LAB — CUP PACEART REMOTE DEVICE CHECK
Date Time Interrogation Session: 20220322230309
Implantable Pulse Generator Implant Date: 20210111

## 2020-06-25 ENCOUNTER — Ambulatory Visit (INDEPENDENT_AMBULATORY_CARE_PROVIDER_SITE_OTHER): Payer: Medicare Other

## 2020-06-25 DIAGNOSIS — I480 Paroxysmal atrial fibrillation: Secondary | ICD-10-CM

## 2020-07-02 ENCOUNTER — Other Ambulatory Visit: Payer: Self-pay

## 2020-07-02 ENCOUNTER — Ambulatory Visit
Admission: RE | Admit: 2020-07-02 | Discharge: 2020-07-02 | Disposition: A | Discharge status: Home / Self Care | Payer: Medicare Other | Source: Ambulatory Visit | Attending: Family Medicine | Admitting: Family Medicine

## 2020-07-02 DIAGNOSIS — Z1231 Encounter for screening mammogram for malignant neoplasm of breast: Secondary | ICD-10-CM

## 2020-07-09 NOTE — Progress Notes (Signed)
Carelink Summary Report / Loop Recorder 

## 2020-07-12 DIAGNOSIS — Z23 Encounter for immunization: Secondary | ICD-10-CM | POA: Diagnosis not present

## 2020-07-23 ENCOUNTER — Ambulatory Visit (INDEPENDENT_AMBULATORY_CARE_PROVIDER_SITE_OTHER): Payer: Medicare Other

## 2020-07-23 DIAGNOSIS — R55 Syncope and collapse: Secondary | ICD-10-CM

## 2020-07-24 LAB — CUP PACEART REMOTE DEVICE CHECK
Date Time Interrogation Session: 20220424230319
Implantable Pulse Generator Implant Date: 20210111

## 2020-07-31 ENCOUNTER — Ambulatory Visit (INDEPENDENT_AMBULATORY_CARE_PROVIDER_SITE_OTHER): Payer: Medicare Other | Admitting: Cardiovascular Disease

## 2020-07-31 ENCOUNTER — Encounter: Payer: Self-pay | Admitting: Cardiovascular Disease

## 2020-07-31 ENCOUNTER — Other Ambulatory Visit: Payer: Self-pay

## 2020-07-31 VITALS — BP 174/74 | HR 63 | Ht 62.0 in | Wt 163.0 lb

## 2020-07-31 DIAGNOSIS — E7800 Pure hypercholesterolemia, unspecified: Secondary | ICD-10-CM | POA: Diagnosis not present

## 2020-07-31 DIAGNOSIS — Z7901 Long term (current) use of anticoagulants: Secondary | ICD-10-CM

## 2020-07-31 DIAGNOSIS — I480 Paroxysmal atrial fibrillation: Secondary | ICD-10-CM

## 2020-07-31 DIAGNOSIS — I10 Essential (primary) hypertension: Secondary | ICD-10-CM | POA: Diagnosis not present

## 2020-07-31 DIAGNOSIS — I2510 Atherosclerotic heart disease of native coronary artery without angina pectoris: Secondary | ICD-10-CM

## 2020-07-31 DIAGNOSIS — R55 Syncope and collapse: Secondary | ICD-10-CM | POA: Diagnosis not present

## 2020-07-31 DIAGNOSIS — Z95818 Presence of other cardiac implants and grafts: Secondary | ICD-10-CM

## 2020-07-31 NOTE — Progress Notes (Signed)
Cardiology Office Note    Date:  07/31/2020   ID:  Melinda [name], Melinda [name] [birth year], MRN [id number]  PCP:  [name], MD  Cardiologist:   Sanda Klein, MD   Chief Complaint  Patient presents with  . Atrial Fibrillation    History of Present Illness:  Melinda [name] is a 86 y.o. female with recent recurrent syncope leading to implantable loop recorder, history of paroxysmal atrial fibrillation, takotsubo cardiomyopathy with complete resolution, minor coronary artery disease with 75% stenosis of a first oblique marginal artery (asymptomatic). Last echo in October 2019 showed normal LVEF 55-60%.  She has not had any new episodes of syncope or falls.  Her last event was when she had a CT angiogram.  After she got up the examination table and walked a few steps she felt "something was wrong" and told the nurse that "I have to sit down".  She eventually improved when she lie down horizontally.  Her blood pressure was checked and was actually high.  She did not lose consciousness.  The CT angiogram only showed relatively mild intracranial arterial stenosis due to atherosclerosis.  The patient specifically denies any chest pain at rest exertion, dyspnea at rest or with exertion, orthopnea, paroxysmal nocturnal dyspnea, syncope, palpitations, focal neurological deficits, intermittent claudication, lower extremity edema, unexplained weight gain, cough, hemoptysis or wheezing.  Her loop recorder showed an episode of atrial fibrillation with controlled ventricular rate (average 98 bpm) on March 31, 2020, lasting for about 6 hours.  She has not had any new events since then.  She was seen in the emergency room with epistaxis in September and October of last year, no problems since then.    Her most recent lipid profile from just a few weeks ago shows an acceptable LDL of 89 and an excellent HDL of 77.  She has normal renal function, thyroid function and hemoglobin.  CT angiogram of the  head performed October 2021 showed mild diffuse cerebral atrophy with ex vacuo dilatation no evidence of recent stroke or intracranial hemorrhage, but with chronic microvascular ischemic changes.  There was mild carotid siphon and V4 segment atheromatous disease with only mild luminal narrowing.   Past Medical History:  Diagnosis Date  . Allergy   . Anemia 1984   before hysterectomy  . Breast cancer ([place])    bilateral  . GERD (gastroesophageal reflux disease)   . History of blood product transfusion 1984  . Hyperlipidemia   . Hypertension   . Macular degeneration    right eye  . Myocardial infarction ([place])   . Osteopenia   . Personal history of radiation therapy   . Takotsubo cardiomyopathy     Past Surgical History:  Procedure Laterality Date  . ABDOMINAL HYSTERECTOMY  1984  . APPENDECTOMY    . BREAST BIOPSY Right 04/29/05   right   . BREAST BIOPSY Left 2006  . BREAST LUMPECTOMY Left 04/19/04   left with sentinel node  . BREAST LUMPECTOMY Right 2007  . CATARACT EXTRACTION     bilateral  . EYE SURGERY    . LEFT HEART CATHETERIZATION WITH CORONARY ANGIOGRAM N/A 07/26/2013   Procedure: LEFT HEART CATHETERIZATION WITH CORONARY ANGIOGRAM;  Surgeon: Jettie Booze, MD;  Location: [facility] CATH LAB;  Service: Cardiovascular;  Laterality: N/A;  . RETINAL LASER PROCEDURE     right    Current Medications: Outpatient Medications Prior to Visit  Medication Sig Dispense Refill  . acetaminophen (TYLENOL) 500 MG tablet Take 1,000 mg by  mouth at bedtime as needed (for pain, headaches, or fever).     Marland Kitchen albuterol (VENTOLIN HFA) 108 (90 Base) MCG/ACT inhaler Inhale 1-2 puffs into the lungs every 6 (six) hours as needed for wheezing or shortness of breath.    Marland Kitchen atorvastatin (LIPITOR) 20 MG tablet Take 20 mg by mouth at bedtime.    . cholecalciferol (VITAMIN D) 1000 units tablet Take 1,000 Units by mouth daily.    Marland Kitchen ELIQUIS 5 MG TABS tablet TAKE ONE TABLET BY MOUTH TWICE A DAY 180 tablet 1   . fexofenadine (ALLEGRA) 180 MG tablet Take 180 mg by mouth daily.    Marland Kitchen latanoprost (XALATAN) 0.005 % ophthalmic solution Place 1 drop into both eyes at bedtime.     Marland Kitchen losartan (COZAAR) 50 MG tablet Take 2 tablets (100 mg total) by mouth daily. 180 tablet 3  . metoprolol succinate (TOPROL-XL) 25 MG 24 hr tablet TAKE ONE TABLET BY MOUTH DAILY 90 tablet 3  . nitroGLYCERIN (NITROSTAT) 0.4 MG SL tablet Place 1 tablet (0.4 mg total) under the tongue every 5 (five) minutes as needed for chest pain. 25 tablet 3  . sertraline (ZOLOFT) 25 MG tablet Take 25 mg by mouth daily.    . timolol (TIMOPTIC) 0.5 % ophthalmic solution Place 1 drop into the right eye every morning.      Facility-Administered Medications Prior to Visit  Medication Dose Route Frequency Provider Last Rate Last Admin  . lidocaine-EPINEPHrine (XYLOCAINE W/EPI) 1 %-1:100000 (with pres) injection 10 mL  10 mL Infiltration Once Brendia Dampier, MD         Allergies:   Nsaids, Salicylates, Hctz [hydrochlorothiazide], Codeine, Etodolac, and Hydrochlorothiazide   Social History   Socioeconomic History  . Marital status: Married    Spouse name: Herbie Baltimore  . Number of children: 2  . Years of education: college  . Highest education level: Not on file  Occupational History  . Not on file  Tobacco Use  . Smoking status: Former Smoker    Types: Cigarettes    Quit date: 1990    Years since quitting: 32.3  . Smokeless tobacco: Never Used  Vaping Use  . Vaping Use: Never used  Substance and Sexual Activity  . Alcohol use: Yes    Comment: 3 drinks per night, "too much"  . Drug use: No  . Sexual activity: Not on file  Other Topics Concern  . Not on file  Social History Narrative   Lives with Spouse and dog   Drinks 3 cups of caffeine daily   Right handed             Social Determinants of Health   Financial Resource Strain: Not on file  Food Insecurity: Not on file  Transportation Needs: Not on file  Physical Activity: Not  on file  Stress: Not on file  Social Connections: Not on file     Family History:  The patient's family history includes Breast cancer in her maternal aunt and maternal grandmother; Cancer in her father, maternal aunt, and maternal grandmother; Dementia in her mother; Heart attack in her maternal grandfather and paternal grandfather; Heart disease (age of onset: 45) in her father; Heart disease (age of onset: 37) in her mother; Stroke in her mother.   ROS:   Please see the history of present illness.    All other systems are reviewed and are negative.   PHYSICAL EXAM:   VS:  BP (!) 174/74   Pulse 63   Ht 5'  2" (1.575 m)   Wt 163 lb (73.9 kg)   SpO2 97%   BMI 29.81 kg/m      General: Alert, oriented x3, no distress,  Head: no evidence of trauma, PERRL, EOMI, no exophtalmos or lid lag, no myxedema, no xanthelasma; normal ears, nose and oropharynx Neck: normal jugular venous pulsations and no hepatojugular reflux; brisk carotid pulses without delay and no carotid bruits Chest: clear to auscultation, no signs of consolidation by percussion or palpation, normal fremitus, symmetrical and full respiratory excursions Cardiovascular: normal position and quality of the apical impulse, regular rhythm, normal first and second heart sounds, no murmurs, rubs or gallops Abdomen: no tenderness or distention, no masses by palpation, no abnormal pulsatility or arterial bruits, normal bowel sounds, no hepatosplenomegaly Extremities: no clubbing, cyanosis or edema; 2+ radial, ulnar and brachial pulses bilaterally; 2+ right femoral, posterior tibial and dorsalis pedis pulses; 2+ left femoral, posterior tibial and dorsalis pedis pulses; no subclavian or femoral bruits Neurological: grossly nonfocal Psych: Normal mood and affect    Wt Readings from Last 3 Encounters:  07/31/20 163 lb (73.9 kg)  05/31/20 158 lb (71.7 kg)  01/17/20 162 lb (73.5 kg)      Studies/Labs Reviewed:   ECHO 03/04/2019:  1.  Left ventricular ejection fraction, by visual estimation, is 60 to 65%. The left ventricle has normal function. There is no left ventricular hypertrophy.  2. Global right ventricle has normal systolic function.The right ventricular size is normal. No increase in right ventricular wall thickness.  3. Left atrial size was normal.  4. Right atrial size was normal.  5. The mitral valve is grossly normal. No evidence of mitral valve regurgitation.  6. The tricuspid valve is grossly normal. Tricuspid valve regurgitation is not demonstrated.  7. Aortic valve regurgitation is mild to moderate.  8. The aortic valve is tricuspid. Aortic valve regurgitation is mild to moderate. Mild to moderate aortic valve stenosis.  9. The pulmonic valve was grossly normal. Pulmonic valve regurgitation is trivial. 10. The atrial septum is grossly normal.  EKG:  EKG is not ordered today.  ECG is ordered today shows normal sinus rhythm with left atrial abnormality, no repolarization changes, QTC 431 ms.    Her loop recorder was downloaded in the office today and shows no interval arrhythmia since January 1. Recent Labs: 06/13/2019 hemoglobin A1c 5.3%, creatinine 0.57, potassium 4.0, normal liver function tests 06/14/2020 creatinine 0.74, potassium 3.9, normal liver function tests, hemoglobin A1c 5.4% Lipid Panel    Component Value Date/Time   CHOL 159 07/26/2013 0718   TRIG 86 07/26/2013 0718   HDL 89 07/26/2013 0718   CHOLHDL 1.8 07/26/2013 0718   VLDL 17 07/26/2013 0718   LDLCALC 53 07/26/2013 0718  12/02/2018 Total cholesterol 176, HDL 80, LDL 81, triglycerides 79, hemoglobin A1c 5.4% 06/13/2019 Total cholesterol 181, HDL 76, LDL 81, triglycerides 80 06/14/2020 Cholesterol 187, HDL 77, LDL 89, triglycerides 117  ASSESSMENT:    1. Paroxysmal atrial fibrillation (HCC)   2. Long term current use of anticoagulant   3. Vasovagal syncope   4. Coronary artery disease involving native coronary artery of native  heart without angina pectoris   5. Hypercholesterolemia   6. Essential hypertension   7. Status post placement of implantable loop recorder      PLAN:  In order of problems listed above:  1. AFib: She has very infrequent episodes of atrial fibrillation.  Since loop recorder implantation she has essentially had 1 event lasting about 6 hours.  She has occasional brief episodes of paroxysmal atrial tachycardia.Marland Kitchen CHADSVasc 5 (age 42, gender, HTN, CAD). 2. Eliquis: Previous problems with falls and some episodes of epistaxis last September-October.  No problems in the last several months. 3. Syncope: Has with all her previous episodes of syncope and near syncope, the symptoms of her most recent event are strongly suggestive of vasovagal mechanism.  No meaningful arrhythmia has ever been detected at the time of her presyncopal syncopal events since we implanted a loop recorder.   4. CAD: She does not have angina pectoris.. Moderate CAD was discovered incidentally when she had coronary angiography for takotsubo syndrome.  5. HLP: Excellent HDL, acceptable LDL. 6. HTN: Fair control.  Target blood pressure 140-150/70-80 since she has a history of falls related to orthostatic hypotension. 7. ILR: A 6-hour episode of atrial fibrillation was recorded on January 1 and was asymptomatic.  No episodes of significant pauses or severe bradycardia. 8. Intracranial atherosclerosis: No significant stenoses by recent CT angiogram. 9. Stereotypical recurrent transient right sensory changes: Per Dr. Tobey Grim recent notes, "The patient has a Jacksonian type March with the sensory changes that suggest that the events represent either a sensory seizure or a migraine equivalent event.  There is no evidence of heightened cerebrovascular risk.  At this point, the patient is no longer having her episodes.  If the spells continue, I may consider empiric trial on anticonvulsant medication.  Otherwise, she will follow up here on an  as-needed basis".   Medication Adjustments/Labs and Tests Ordered: Current medicines are reviewed at length with the patient today.  Concerns regarding medicines are outlined above.  Medication changes, Labs and Tests ordered today are listed in the Patient Instructions below. Patient Instructions  Medication Instructions:  No changes *If you need a refill on your cardiac medications before your next appointment, please call your pharmacy*   Lab Work: None ordered If you have labs (blood work) drawn today and your tests are completely normal, you will receive your results only by: Marland Kitchen MyChart Message (if you have MyChart) OR . A paper copy in the mail If you have any lab test that is abnormal or we need to change your treatment, we will call you to review the results.   Testing/Procedures: None ordered   Follow-Up: At [facility], you and your health needs are our priority.  As part of our continuing mission to provide you with exceptional heart care, we have created designated Provider Care Teams.  These Care Teams include your primary Cardiologist (physician) and Advanced Practice Providers (APPs -  Physician Assistants and Nurse Practitioners) who all work together to provide you with the care you need, when you need it.  We recommend signing up for the patient portal called "MyChart".  Sign up information is provided on this After Visit Summary.  MyChart is used to connect with patients for Virtual Visits (Telemedicine).  Patients are able to view lab/test results, encounter notes, upcoming appointments, etc.  Non-urgent messages can be sent to your provider as well.   To learn more about what you can do with MyChart, go to [email or web address].    Your next appointment:   12 month(s)  The format for your next appointment:   In Person  Provider:   You may see Sanda Klein, MD or one of the following Advanced Practice Providers on your designated Care Team:    Almyra Deforest,  PA-C  Fabian Sharp, PA-C or   Roby Lofts, [place]  Signed, Sanda Klein, MD  07/31/2020 3:10 PM    [facility] [address], [place], [place]  [address] Phone: [phone]; Fax: [phone]

## 2020-07-31 NOTE — Patient Instructions (Signed)

## 2020-08-06 DIAGNOSIS — Z23 Encounter for immunization: Secondary | ICD-10-CM | POA: Diagnosis not present

## 2020-08-10 NOTE — Progress Notes (Signed)
Carelink Summary Report / Loop Recorder 

## 2020-08-27 LAB — CUP PACEART REMOTE DEVICE CHECK
Date Time Interrogation Session: 20220527230216
Implantable Pulse Generator Implant Date: 20210111

## 2020-08-28 ENCOUNTER — Ambulatory Visit (INDEPENDENT_AMBULATORY_CARE_PROVIDER_SITE_OTHER): Payer: Medicare Other

## 2020-08-28 DIAGNOSIS — I480 Paroxysmal atrial fibrillation: Secondary | ICD-10-CM | POA: Diagnosis not present

## 2020-09-06 DIAGNOSIS — U071 COVID-19: Secondary | ICD-10-CM | POA: Diagnosis not present

## 2020-09-20 NOTE — Progress Notes (Signed)
Carelink Summary Report / Loop Recorder 

## 2020-10-02 ENCOUNTER — Ambulatory Visit (INDEPENDENT_AMBULATORY_CARE_PROVIDER_SITE_OTHER): Payer: Medicare Other

## 2020-10-02 ENCOUNTER — Other Ambulatory Visit: Payer: Self-pay | Admitting: Family Medicine

## 2020-10-02 DIAGNOSIS — M8580 Other specified disorders of bone density and structure, unspecified site: Secondary | ICD-10-CM

## 2020-10-02 DIAGNOSIS — I480 Paroxysmal atrial fibrillation: Secondary | ICD-10-CM | POA: Diagnosis not present

## 2020-10-04 LAB — CUP PACEART REMOTE DEVICE CHECK
Date Time Interrogation Session: 20220629230502
Implantable Pulse Generator Implant Date: 20210111

## 2020-10-15 ENCOUNTER — Other Ambulatory Visit: Payer: Self-pay | Admitting: Cardiovascular Disease

## 2020-10-16 NOTE — Telephone Encounter (Signed)
50f, 73.9kg, scr 0.5710/19/21, lovw/croitoru 07/31/20

## 2020-10-23 NOTE — Progress Notes (Signed)
Carelink Summary Report / Loop Recorder 

## 2020-10-24 DIAGNOSIS — M25461 Effusion, right knee: Secondary | ICD-10-CM | POA: Diagnosis not present

## 2020-10-24 DIAGNOSIS — M1711 Unilateral primary osteoarthritis, right knee: Secondary | ICD-10-CM | POA: Diagnosis not present

## 2020-10-30 DIAGNOSIS — M1711 Unilateral primary osteoarthritis, right knee: Secondary | ICD-10-CM | POA: Diagnosis not present

## 2020-10-30 DIAGNOSIS — M25561 Pain in right knee: Secondary | ICD-10-CM | POA: Diagnosis not present

## 2020-10-30 DIAGNOSIS — M7601 Gluteal tendinitis, right hip: Secondary | ICD-10-CM | POA: Diagnosis not present

## 2020-10-30 DIAGNOSIS — S32030D Wedge compression fracture of third lumbar vertebra, subsequent encounter for fracture with routine healing: Secondary | ICD-10-CM | POA: Diagnosis not present

## 2020-10-30 DIAGNOSIS — M545 Low back pain, unspecified: Secondary | ICD-10-CM | POA: Diagnosis not present

## 2020-11-01 LAB — CUP PACEART REMOTE DEVICE CHECK
Date Time Interrogation Session: 20220801230207
Implantable Pulse Generator Implant Date: 20210111

## 2020-11-05 ENCOUNTER — Ambulatory Visit (INDEPENDENT_AMBULATORY_CARE_PROVIDER_SITE_OTHER): Payer: Medicare Other

## 2020-11-05 DIAGNOSIS — M545 Low back pain, unspecified: Secondary | ICD-10-CM | POA: Diagnosis not present

## 2020-11-05 DIAGNOSIS — M7601 Gluteal tendinitis, right hip: Secondary | ICD-10-CM | POA: Diagnosis not present

## 2020-11-05 DIAGNOSIS — S32030D Wedge compression fracture of third lumbar vertebra, subsequent encounter for fracture with routine healing: Secondary | ICD-10-CM | POA: Diagnosis not present

## 2020-11-05 DIAGNOSIS — I480 Paroxysmal atrial fibrillation: Secondary | ICD-10-CM | POA: Diagnosis not present

## 2020-11-05 DIAGNOSIS — M1711 Unilateral primary osteoarthritis, right knee: Secondary | ICD-10-CM | POA: Diagnosis not present

## 2020-11-05 DIAGNOSIS — M25561 Pain in right knee: Secondary | ICD-10-CM | POA: Diagnosis not present

## 2020-11-08 DIAGNOSIS — M545 Low back pain, unspecified: Secondary | ICD-10-CM | POA: Diagnosis not present

## 2020-11-08 DIAGNOSIS — S32030D Wedge compression fracture of third lumbar vertebra, subsequent encounter for fracture with routine healing: Secondary | ICD-10-CM | POA: Diagnosis not present

## 2020-11-08 DIAGNOSIS — M7601 Gluteal tendinitis, right hip: Secondary | ICD-10-CM | POA: Diagnosis not present

## 2020-11-08 DIAGNOSIS — M1711 Unilateral primary osteoarthritis, right knee: Secondary | ICD-10-CM | POA: Diagnosis not present

## 2020-11-08 DIAGNOSIS — M25561 Pain in right knee: Secondary | ICD-10-CM | POA: Diagnosis not present

## 2020-11-12 DIAGNOSIS — M545 Low back pain, unspecified: Secondary | ICD-10-CM | POA: Diagnosis not present

## 2020-11-12 DIAGNOSIS — M1711 Unilateral primary osteoarthritis, right knee: Secondary | ICD-10-CM | POA: Diagnosis not present

## 2020-11-12 DIAGNOSIS — M25561 Pain in right knee: Secondary | ICD-10-CM | POA: Diagnosis not present

## 2020-11-12 DIAGNOSIS — M7601 Gluteal tendinitis, right hip: Secondary | ICD-10-CM | POA: Diagnosis not present

## 2020-11-12 DIAGNOSIS — S32030D Wedge compression fracture of third lumbar vertebra, subsequent encounter for fracture with routine healing: Secondary | ICD-10-CM | POA: Diagnosis not present

## 2020-11-15 DIAGNOSIS — M1711 Unilateral primary osteoarthritis, right knee: Secondary | ICD-10-CM | POA: Diagnosis not present

## 2020-11-15 DIAGNOSIS — S32030D Wedge compression fracture of third lumbar vertebra, subsequent encounter for fracture with routine healing: Secondary | ICD-10-CM | POA: Diagnosis not present

## 2020-11-15 DIAGNOSIS — M545 Low back pain, unspecified: Secondary | ICD-10-CM | POA: Diagnosis not present

## 2020-11-15 DIAGNOSIS — M7601 Gluteal tendinitis, right hip: Secondary | ICD-10-CM | POA: Diagnosis not present

## 2020-11-15 DIAGNOSIS — M25561 Pain in right knee: Secondary | ICD-10-CM | POA: Diagnosis not present

## 2020-11-19 DIAGNOSIS — M1711 Unilateral primary osteoarthritis, right knee: Secondary | ICD-10-CM | POA: Diagnosis not present

## 2020-11-19 DIAGNOSIS — M7601 Gluteal tendinitis, right hip: Secondary | ICD-10-CM | POA: Diagnosis not present

## 2020-11-19 DIAGNOSIS — S32030D Wedge compression fracture of third lumbar vertebra, subsequent encounter for fracture with routine healing: Secondary | ICD-10-CM | POA: Diagnosis not present

## 2020-11-19 DIAGNOSIS — M545 Low back pain, unspecified: Secondary | ICD-10-CM | POA: Diagnosis not present

## 2020-11-19 DIAGNOSIS — M25561 Pain in right knee: Secondary | ICD-10-CM | POA: Diagnosis not present

## 2020-11-22 DIAGNOSIS — M25561 Pain in right knee: Secondary | ICD-10-CM | POA: Diagnosis not present

## 2020-11-22 DIAGNOSIS — S32030D Wedge compression fracture of third lumbar vertebra, subsequent encounter for fracture with routine healing: Secondary | ICD-10-CM | POA: Diagnosis not present

## 2020-11-22 DIAGNOSIS — M545 Low back pain, unspecified: Secondary | ICD-10-CM | POA: Diagnosis not present

## 2020-11-22 DIAGNOSIS — M1711 Unilateral primary osteoarthritis, right knee: Secondary | ICD-10-CM | POA: Diagnosis not present

## 2020-11-22 DIAGNOSIS — M7601 Gluteal tendinitis, right hip: Secondary | ICD-10-CM | POA: Diagnosis not present

## 2020-11-23 DIAGNOSIS — M1711 Unilateral primary osteoarthritis, right knee: Secondary | ICD-10-CM | POA: Diagnosis not present

## 2020-11-23 DIAGNOSIS — M25561 Pain in right knee: Secondary | ICD-10-CM | POA: Diagnosis not present

## 2020-11-23 DIAGNOSIS — S32030D Wedge compression fracture of third lumbar vertebra, subsequent encounter for fracture with routine healing: Secondary | ICD-10-CM | POA: Diagnosis not present

## 2020-11-23 DIAGNOSIS — M545 Low back pain, unspecified: Secondary | ICD-10-CM | POA: Diagnosis not present

## 2020-11-23 DIAGNOSIS — M7601 Gluteal tendinitis, right hip: Secondary | ICD-10-CM | POA: Diagnosis not present

## 2020-11-26 DIAGNOSIS — M1711 Unilateral primary osteoarthritis, right knee: Secondary | ICD-10-CM | POA: Diagnosis not present

## 2020-11-26 DIAGNOSIS — M545 Low back pain, unspecified: Secondary | ICD-10-CM | POA: Diagnosis not present

## 2020-11-26 DIAGNOSIS — M25561 Pain in right knee: Secondary | ICD-10-CM | POA: Diagnosis not present

## 2020-11-26 DIAGNOSIS — M7601 Gluteal tendinitis, right hip: Secondary | ICD-10-CM | POA: Diagnosis not present

## 2020-11-26 DIAGNOSIS — H401131 Primary open-angle glaucoma, bilateral, mild stage: Secondary | ICD-10-CM | POA: Diagnosis not present

## 2020-11-26 DIAGNOSIS — S32030D Wedge compression fracture of third lumbar vertebra, subsequent encounter for fracture with routine healing: Secondary | ICD-10-CM | POA: Diagnosis not present

## 2020-11-29 DIAGNOSIS — M1711 Unilateral primary osteoarthritis, right knee: Secondary | ICD-10-CM | POA: Diagnosis not present

## 2020-11-29 DIAGNOSIS — M7601 Gluteal tendinitis, right hip: Secondary | ICD-10-CM | POA: Diagnosis not present

## 2020-11-29 DIAGNOSIS — M25561 Pain in right knee: Secondary | ICD-10-CM | POA: Diagnosis not present

## 2020-11-29 DIAGNOSIS — M545 Low back pain, unspecified: Secondary | ICD-10-CM | POA: Diagnosis not present

## 2020-11-29 NOTE — Progress Notes (Signed)
Carelink Summary Report / Loop Recorder 

## 2020-12-03 DIAGNOSIS — M1711 Unilateral primary osteoarthritis, right knee: Secondary | ICD-10-CM | POA: Diagnosis not present

## 2020-12-03 DIAGNOSIS — M7601 Gluteal tendinitis, right hip: Secondary | ICD-10-CM | POA: Diagnosis not present

## 2020-12-03 DIAGNOSIS — M25561 Pain in right knee: Secondary | ICD-10-CM | POA: Diagnosis not present

## 2020-12-03 DIAGNOSIS — M545 Low back pain, unspecified: Secondary | ICD-10-CM | POA: Diagnosis not present

## 2020-12-06 DIAGNOSIS — M25561 Pain in right knee: Secondary | ICD-10-CM | POA: Diagnosis not present

## 2020-12-06 DIAGNOSIS — M1711 Unilateral primary osteoarthritis, right knee: Secondary | ICD-10-CM | POA: Diagnosis not present

## 2020-12-06 DIAGNOSIS — M545 Low back pain, unspecified: Secondary | ICD-10-CM | POA: Diagnosis not present

## 2020-12-06 DIAGNOSIS — M7601 Gluteal tendinitis, right hip: Secondary | ICD-10-CM | POA: Diagnosis not present

## 2020-12-10 ENCOUNTER — Ambulatory Visit (INDEPENDENT_AMBULATORY_CARE_PROVIDER_SITE_OTHER): Payer: Medicare Other

## 2020-12-10 DIAGNOSIS — R55 Syncope and collapse: Secondary | ICD-10-CM | POA: Diagnosis not present

## 2020-12-10 DIAGNOSIS — M545 Low back pain, unspecified: Secondary | ICD-10-CM | POA: Diagnosis not present

## 2020-12-10 DIAGNOSIS — M1711 Unilateral primary osteoarthritis, right knee: Secondary | ICD-10-CM | POA: Diagnosis not present

## 2020-12-10 DIAGNOSIS — M25561 Pain in right knee: Secondary | ICD-10-CM | POA: Diagnosis not present

## 2020-12-10 DIAGNOSIS — M7601 Gluteal tendinitis, right hip: Secondary | ICD-10-CM | POA: Diagnosis not present

## 2020-12-13 DIAGNOSIS — Z23 Encounter for immunization: Secondary | ICD-10-CM | POA: Diagnosis not present

## 2020-12-13 LAB — CUP PACEART REMOTE DEVICE CHECK
Date Time Interrogation Session: 20220903230630
Implantable Pulse Generator Implant Date: 20210111

## 2020-12-17 DIAGNOSIS — M7601 Gluteal tendinitis, right hip: Secondary | ICD-10-CM | POA: Diagnosis not present

## 2020-12-17 DIAGNOSIS — M25561 Pain in right knee: Secondary | ICD-10-CM | POA: Diagnosis not present

## 2020-12-17 DIAGNOSIS — M545 Low back pain, unspecified: Secondary | ICD-10-CM | POA: Diagnosis not present

## 2020-12-17 DIAGNOSIS — M1711 Unilateral primary osteoarthritis, right knee: Secondary | ICD-10-CM | POA: Diagnosis not present

## 2020-12-19 NOTE — Progress Notes (Signed)
Carelink Summary Report / Loop Recorder 

## 2020-12-21 DIAGNOSIS — M25561 Pain in right knee: Secondary | ICD-10-CM | POA: Diagnosis not present

## 2020-12-21 DIAGNOSIS — M7601 Gluteal tendinitis, right hip: Secondary | ICD-10-CM | POA: Diagnosis not present

## 2020-12-21 DIAGNOSIS — M545 Low back pain, unspecified: Secondary | ICD-10-CM | POA: Diagnosis not present

## 2020-12-21 DIAGNOSIS — M1711 Unilateral primary osteoarthritis, right knee: Secondary | ICD-10-CM | POA: Diagnosis not present

## 2020-12-24 DIAGNOSIS — J301 Allergic rhinitis due to pollen: Secondary | ICD-10-CM | POA: Diagnosis not present

## 2020-12-24 DIAGNOSIS — E559 Vitamin D deficiency, unspecified: Secondary | ICD-10-CM | POA: Diagnosis not present

## 2020-12-24 DIAGNOSIS — I119 Hypertensive heart disease without heart failure: Secondary | ICD-10-CM | POA: Diagnosis not present

## 2020-12-24 DIAGNOSIS — F39 Unspecified mood [affective] disorder: Secondary | ICD-10-CM | POA: Diagnosis not present

## 2020-12-24 DIAGNOSIS — M8588 Other specified disorders of bone density and structure, other site: Secondary | ICD-10-CM | POA: Diagnosis not present

## 2020-12-24 DIAGNOSIS — H35033 Hypertensive retinopathy, bilateral: Secondary | ICD-10-CM | POA: Diagnosis not present

## 2020-12-24 DIAGNOSIS — I4891 Unspecified atrial fibrillation: Secondary | ICD-10-CM | POA: Diagnosis not present

## 2020-12-24 DIAGNOSIS — Z0000 Encounter for general adult medical examination without abnormal findings: Secondary | ICD-10-CM | POA: Diagnosis not present

## 2020-12-24 DIAGNOSIS — E7800 Pure hypercholesterolemia, unspecified: Secondary | ICD-10-CM | POA: Diagnosis not present

## 2020-12-24 DIAGNOSIS — D6869 Other thrombophilia: Secondary | ICD-10-CM | POA: Diagnosis not present

## 2020-12-24 DIAGNOSIS — I2510 Atherosclerotic heart disease of native coronary artery without angina pectoris: Secondary | ICD-10-CM | POA: Diagnosis not present

## 2021-01-14 ENCOUNTER — Ambulatory Visit (INDEPENDENT_AMBULATORY_CARE_PROVIDER_SITE_OTHER): Payer: Medicare Other

## 2021-01-14 DIAGNOSIS — R55 Syncope and collapse: Secondary | ICD-10-CM | POA: Diagnosis not present

## 2021-01-17 LAB — CUP PACEART REMOTE DEVICE CHECK
Date Time Interrogation Session: 20221019144255
Implantable Pulse Generator Implant Date: 20210111

## 2021-01-23 NOTE — Progress Notes (Signed)
Carelink Summary Report / Loop Recorder 

## 2021-02-02 DIAGNOSIS — Z23 Encounter for immunization: Secondary | ICD-10-CM | POA: Diagnosis not present

## 2021-02-04 ENCOUNTER — Other Ambulatory Visit: Payer: Self-pay | Admitting: Cardiovascular Disease

## 2021-02-12 DIAGNOSIS — R5383 Other fatigue: Secondary | ICD-10-CM | POA: Diagnosis not present

## 2021-02-12 DIAGNOSIS — I350 Nonrheumatic aortic (valve) stenosis: Secondary | ICD-10-CM | POA: Diagnosis not present

## 2021-02-12 DIAGNOSIS — R195 Other fecal abnormalities: Secondary | ICD-10-CM | POA: Diagnosis not present

## 2021-02-15 DIAGNOSIS — R195 Other fecal abnormalities: Secondary | ICD-10-CM | POA: Diagnosis not present

## 2021-02-15 DIAGNOSIS — R5383 Other fatigue: Secondary | ICD-10-CM | POA: Diagnosis not present

## 2021-02-18 ENCOUNTER — Ambulatory Visit (INDEPENDENT_AMBULATORY_CARE_PROVIDER_SITE_OTHER): Payer: Medicare Other

## 2021-02-18 DIAGNOSIS — R55 Syncope and collapse: Secondary | ICD-10-CM | POA: Diagnosis not present

## 2021-02-19 LAB — CUP PACEART REMOTE DEVICE CHECK
Date Time Interrogation Session: 20221120230939
Implantable Pulse Generator Implant Date: 20210111

## 2021-02-28 NOTE — Progress Notes (Signed)
Carelink Summary Report / Loop Recorder 

## 2021-03-14 ENCOUNTER — Other Ambulatory Visit: Payer: Self-pay | Admitting: Family Medicine

## 2021-03-14 DIAGNOSIS — M8580 Other specified disorders of bone density and structure, unspecified site: Secondary | ICD-10-CM

## 2021-03-15 ENCOUNTER — Ambulatory Visit
Admission: RE | Admit: 2021-03-15 | Discharge: 2021-03-15 | Disposition: A | Discharge status: Home / Self Care | Payer: Medicare Other | Source: Ambulatory Visit | Attending: Family Medicine | Admitting: Family Medicine

## 2021-03-15 DIAGNOSIS — M8580 Other specified disorders of bone density and structure, unspecified site: Secondary | ICD-10-CM

## 2021-03-15 DIAGNOSIS — Z780 Asymptomatic menopausal state: Secondary | ICD-10-CM | POA: Diagnosis not present

## 2021-03-15 DIAGNOSIS — M85852 Other specified disorders of bone density and structure, left thigh: Secondary | ICD-10-CM | POA: Diagnosis not present

## 2021-03-26 ENCOUNTER — Ambulatory Visit (INDEPENDENT_AMBULATORY_CARE_PROVIDER_SITE_OTHER): Payer: Medicare Other

## 2021-03-26 DIAGNOSIS — R55 Syncope and collapse: Secondary | ICD-10-CM | POA: Diagnosis not present

## 2021-03-26 LAB — CUP PACEART REMOTE DEVICE CHECK
Date Time Interrogation Session: 20221223230317
Implantable Pulse Generator Implant Date: 20210111

## 2021-04-05 NOTE — Progress Notes (Signed)
Carelink Summary Report / Loop Recorder 

## 2021-04-09 DIAGNOSIS — H401131 Primary open-angle glaucoma, bilateral, mild stage: Secondary | ICD-10-CM | POA: Diagnosis not present

## 2021-04-09 DIAGNOSIS — H353122 Nonexudative age-related macular degeneration, left eye, intermediate dry stage: Secondary | ICD-10-CM | POA: Diagnosis not present

## 2021-04-09 DIAGNOSIS — H353212 Exudative age-related macular degeneration, right eye, with inactive choroidal neovascularization: Secondary | ICD-10-CM | POA: Diagnosis not present

## 2021-04-09 DIAGNOSIS — H04123 Dry eye syndrome of bilateral lacrimal glands: Secondary | ICD-10-CM | POA: Diagnosis not present

## 2021-04-12 IMAGING — CT CT CERVICAL SPINE W/O CM
4 of 5 series · 14 of 34 positions shown, 16 images · non-contrast
Comparison: MR head without contrast 01/01/2018. CT head without
contrast 11/29/2013.

CLINICAL DATA: Syncopal episode. Fall. Trauma to right hip. Patient
denies headache or neck pain. Initial encounter.

EXAM:
CT HEAD WITHOUT CONTRAST
CT CERVICAL SPINE WITHOUT CONTRAST
TECHNIQUE: Multidetector CT imaging of the head and cervical spine was
performed following the standard protocol without intravenous
contrast. Multiplanar CT image reconstructions of the cervical spine
were also generated.

[Series 8: sag bone · sagittal · 0.39mm/px · 5 of 65 slices shown, 6 images]
[im 22/65  bone]
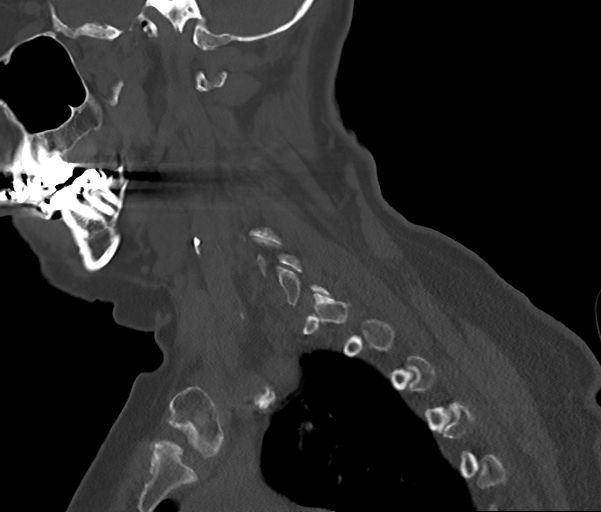
[im 27/65  bone]
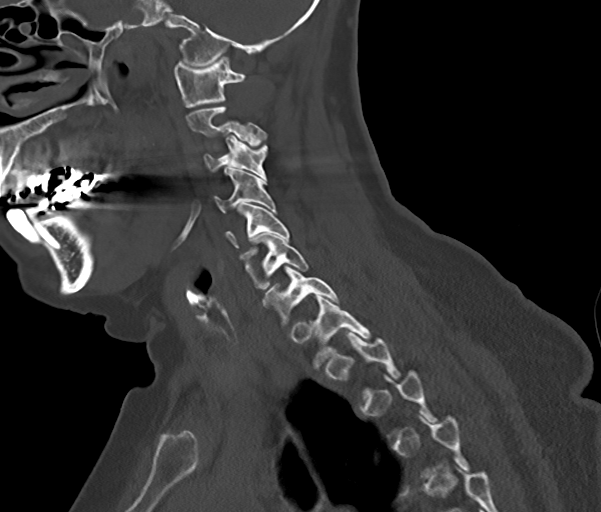
[im 33/65  soft-tissue]
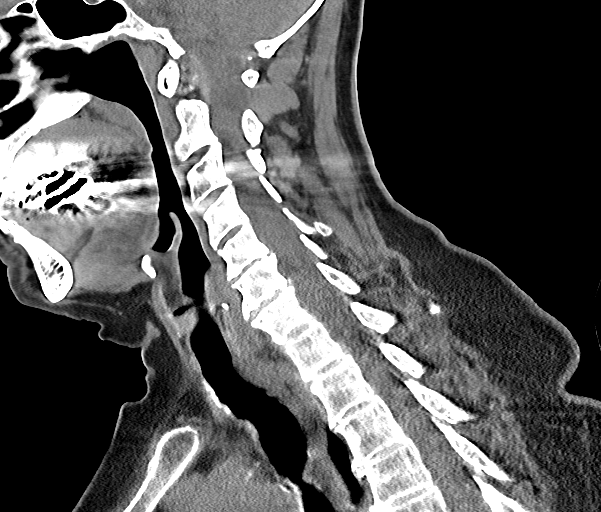
[im 33/65  bone]
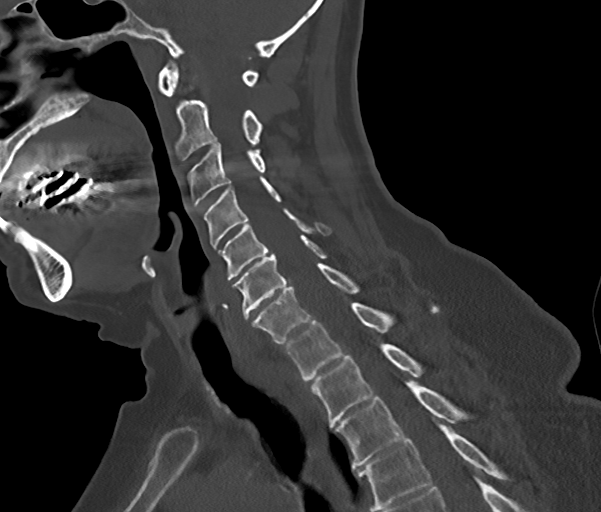
[im 38/65  bone]
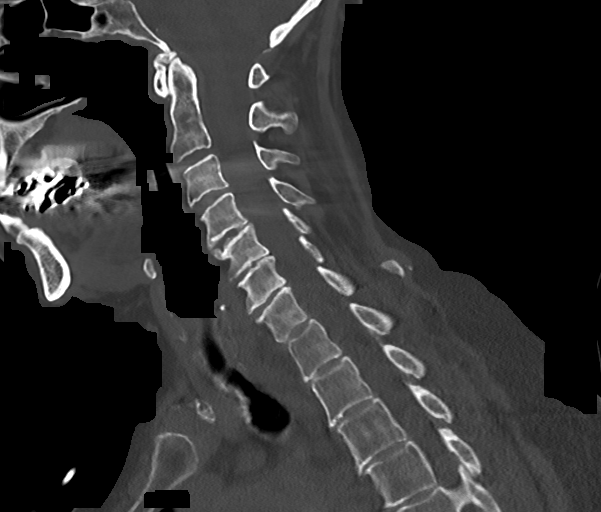
[im 43/65  bone]
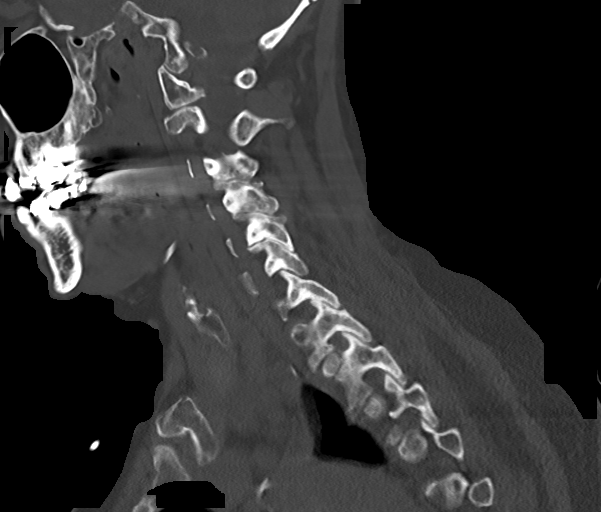

[Series 9: cor bone · coronal · 0.30mm/px · 3 of 72 slices shown]
[im 17/72  bone]
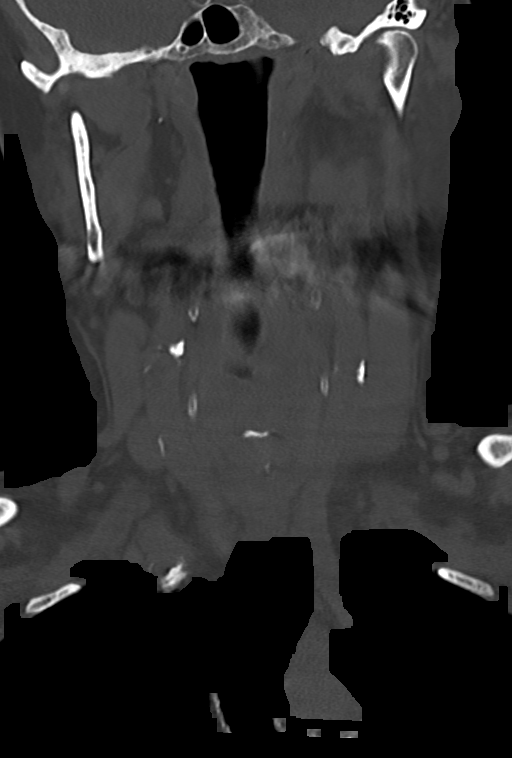
[im 30/72  bone]
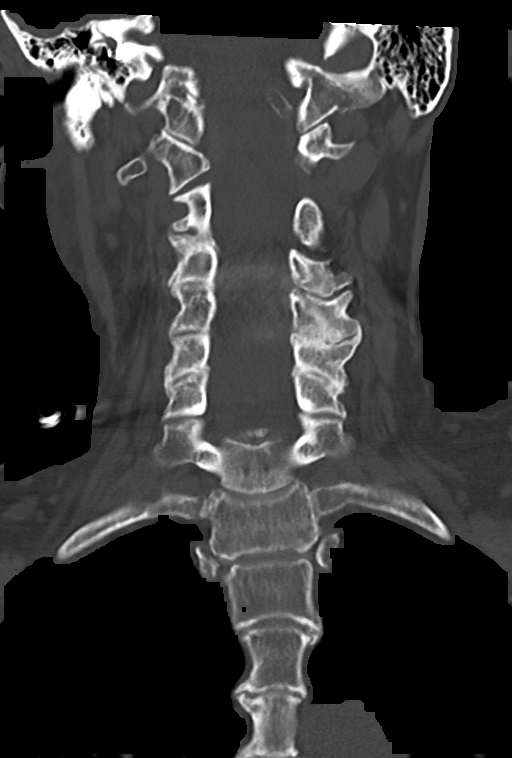
[im 42/72  bone]
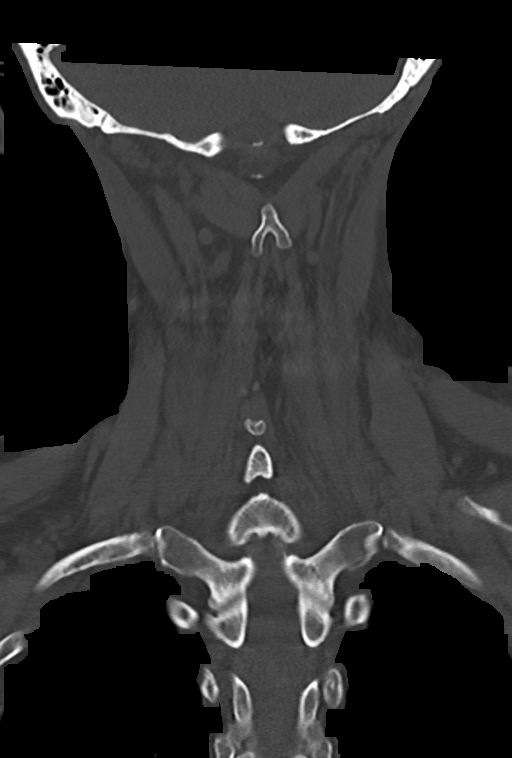

[Series 10: orthogonal axials · axial · 0.21mm/px · z∈[-252,-156]mm · 3 of 114 slices shown, 4 images (1 of 2)]
[im 29/114  soft-tissue]
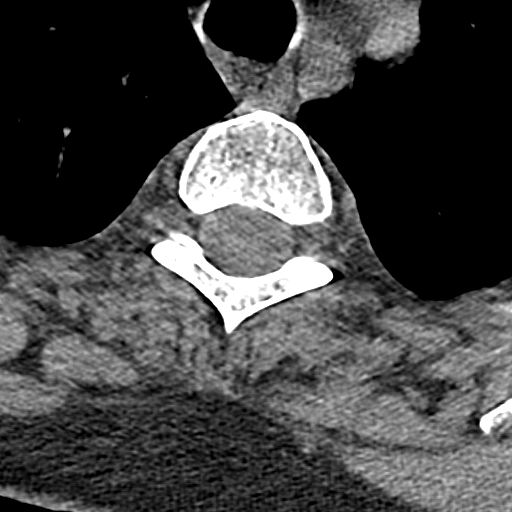
[im 29/114  bone]
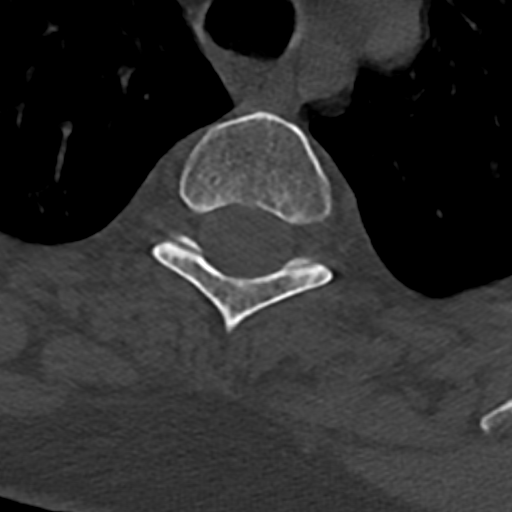
[im 57/114  bone]
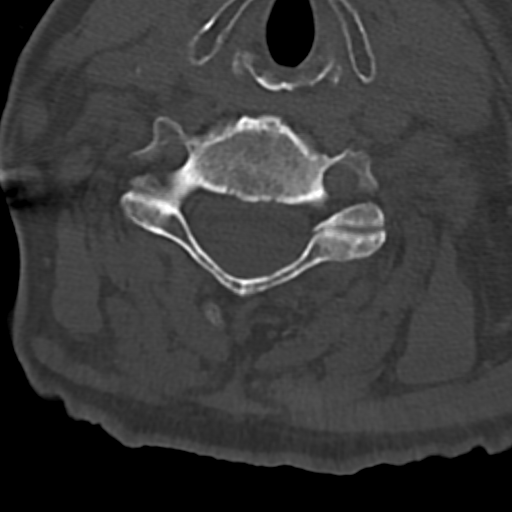
[im 85/114  bone]
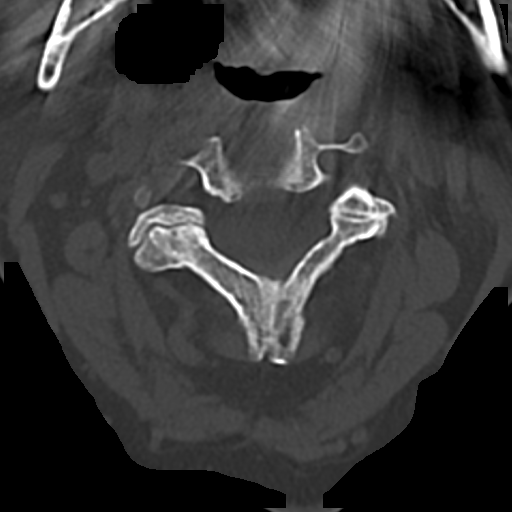

[Series 11: orthogonal axials · axial · 0.21mm/px · z∈[-252,-156]mm · 3 of 114 slices shown (2 of 2)]
[im 29/114  bone]
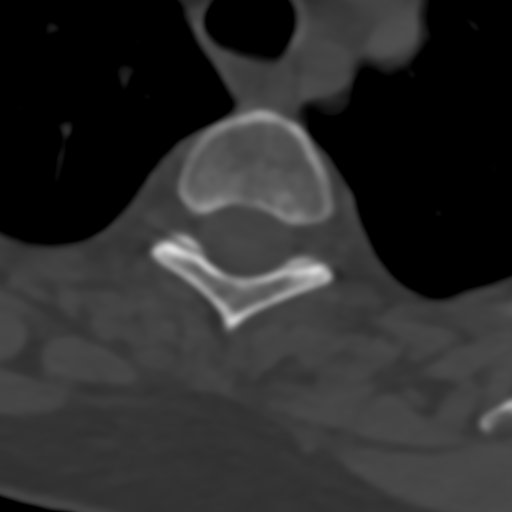
[im 57/114  bone]
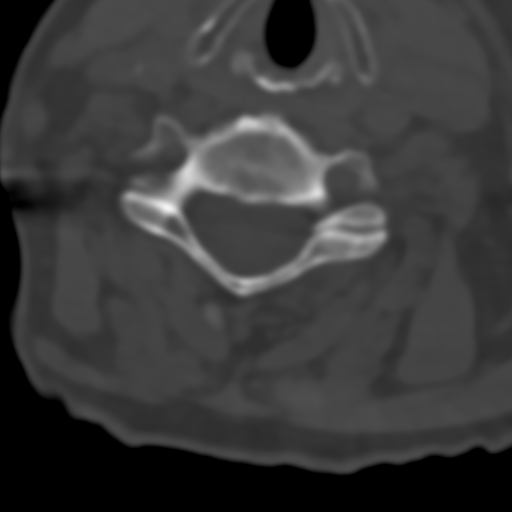
[im 85/114  bone]
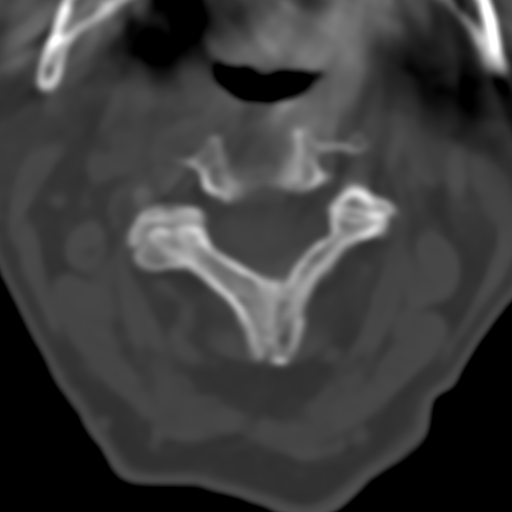

[14 of 34 positions shown; findings below may reference images not displayed]

FINDINGS: CT HEAD FINDINGS

Brain: Mild atrophy and white matter changes are within normal
limits for age. No acute infarct, hemorrhage, or mass lesion is
present. The ventricles are of normal size. No significant
extraaxial fluid collection is present. The brainstem and cerebellum
are within normal limits.

Vascular: Atherosclerotic calcifications are present within the
cavernous internal carotid arteries and at the dural margin of the
vertebral arteries bilaterally. No asymmetric hyperdense vessel is
present.

Skull: Hyperostosis frontalis internus is again noted. No acute
abnormalities are present. No significant extracranial soft tissue
lesion is present.

Sinuses/Orbits: The paranasal sinuses and mastoid air cells are
clear. Bilateral lens replacements are noted. Globes and orbits are
otherwise unremarkable.

CT CERVICAL SPINE FINDINGS

Alignment: There is straightening of the normal cervical lordosis.

Skull base and vertebrae: Craniocervical junction is within normal
limits. Vertebral body heights are maintained. No acute or healing
fractures are present.

Soft tissues and spinal canal: No prevertebral fluid or swelling. No
visible canal hematoma. Atherosclerotic calcifications are present
at the carotid bifurcations bilaterally. No focal soft tissue
lesions are present in the neck. If

Disc levels: Multilevel degenerative changes are present. Facet and
uncovertebral spurring contribute to moderate to severe left
foraminal stenosis at C3-4 and C4-5. Moderate foraminal narrowing
bilaterally at C5-6 is worse on the left. Mild foraminal narrowing
at C6-7 is worse on the left.

Upper chest: The lung apices are clear. Thoracic inlet is normal.
Atherosclerotic changes are noted at the aortic arch and great
vessel origins.
IMPRESSION: 1. No acute trauma to the head or cervical spine.
2. Normal CT appearance of the brain for age.
3. Multilevel degenerative changes of the cervical spine as
described.
4.  Aortic Atherosclerosis ([id number].B).

## 2021-04-13 ENCOUNTER — Other Ambulatory Visit: Payer: Self-pay | Admitting: Cardiovascular Disease

## 2021-04-15 NOTE — Telephone Encounter (Signed)
Prescription refill request for Eliquis received. Indication:Afib Last office visit:5/22 Scr:0.6 Age: 87 Weight:73.9 kg  Prescription refilled

## 2021-04-19 DIAGNOSIS — Z86010 Personal history of colonic polyps: Secondary | ICD-10-CM | POA: Diagnosis not present

## 2021-04-19 DIAGNOSIS — R195 Other fecal abnormalities: Secondary | ICD-10-CM | POA: Diagnosis not present

## 2021-04-22 ENCOUNTER — Other Ambulatory Visit: Payer: Self-pay | Admitting: Gastroenterology

## 2021-04-22 ENCOUNTER — Encounter (INDEPENDENT_AMBULATORY_CARE_PROVIDER_SITE_OTHER): Payer: Medicare Other | Admitting: Ophthalmology

## 2021-04-22 DIAGNOSIS — Z86010 Personal history of colonic polyps: Secondary | ICD-10-CM

## 2021-04-29 ENCOUNTER — Ambulatory Visit (INDEPENDENT_AMBULATORY_CARE_PROVIDER_SITE_OTHER): Payer: Medicare Other

## 2021-04-29 DIAGNOSIS — R55 Syncope and collapse: Secondary | ICD-10-CM

## 2021-04-29 LAB — CUP PACEART REMOTE DEVICE CHECK
Date Time Interrogation Session: 20230129231135
Implantable Pulse Generator Implant Date: 20210111

## 2021-05-06 ENCOUNTER — Encounter (INDEPENDENT_AMBULATORY_CARE_PROVIDER_SITE_OTHER): Payer: Self-pay | Admitting: Ophthalmology

## 2021-05-06 ENCOUNTER — Other Ambulatory Visit: Payer: Self-pay

## 2021-05-06 ENCOUNTER — Ambulatory Visit (INDEPENDENT_AMBULATORY_CARE_PROVIDER_SITE_OTHER): Payer: Medicare Other | Admitting: Ophthalmology

## 2021-05-06 DIAGNOSIS — Z9889 Other specified postprocedural states: Secondary | ICD-10-CM | POA: Diagnosis not present

## 2021-05-06 DIAGNOSIS — H353132 Nonexudative age-related macular degeneration, bilateral, intermediate dry stage: Secondary | ICD-10-CM | POA: Diagnosis not present

## 2021-05-06 DIAGNOSIS — H353212 Exudative age-related macular degeneration, right eye, with inactive choroidal neovascularization: Secondary | ICD-10-CM | POA: Diagnosis not present

## 2021-05-06 NOTE — Assessment & Plan Note (Signed)
Stable overall no sign of CN VM

## 2021-05-06 NOTE — Progress Notes (Signed)
05/06/2021     CHIEF COMPLAINT Patient presents for  Chief Complaint  Patient presents with   Retina Follow Up      HISTORY OF PRESENT ILLNESS: Melinda [name] is a 87 y.o. female who presents to the clinic today for:   HPI     Retina Follow Up           Diagnosis: Wet AMD   Laterality: both eyes   Onset: 1 year ago   Severity: mild   Duration: 1 year         Comments   1 yr fu OU fp oct. Pt states she feels like her vision has declined in the right eye. Pt husband states "she has a hard time with reading the caller ID on the home phone." Denies new FOL or floaters.  Pt is using timolol QD OD.      Last edited by Laurin Coder on 05/06/2021  1:45 PM.      Referring physician: Monna Fam, MD [place],  [place] [id number]  HISTORICAL INFORMATION:   Selected notes from the MEDICAL RECORD NUMBER    Lab Results  Component Value Date   HGBA1C 5.7 (H) 07/25/2013     CURRENT MEDICATIONS: Current Outpatient Medications (Ophthalmic Drugs)  Medication Sig   latanoprost (XALATAN) 0.005 % ophthalmic solution Place 1 drop into both eyes at bedtime.    timolol (TIMOPTIC) 0.5 % ophthalmic solution Place 1 drop into the right eye every morning.    No current facility-administered medications for this visit. (Ophthalmic Drugs)   Current Outpatient Medications (Other)  Medication Sig   acetaminophen (TYLENOL) 500 MG tablet Take 1,000 mg by mouth at bedtime as needed (for pain, headaches, or fever).    albuterol (VENTOLIN HFA) 108 (90 Base) MCG/ACT inhaler Inhale 1-2 puffs into the lungs every 6 (six) hours as needed for wheezing or shortness of breath.   atorvastatin (LIPITOR) 20 MG tablet Take 20 mg by mouth at bedtime.   cholecalciferol (VITAMIN D) 1000 units tablet Take 1,000 Units by mouth daily.   ELIQUIS 5 MG TABS tablet TAKE ONE TABLET BY MOUTH TWICE A DAY   fexofenadine (ALLEGRA) 180 MG tablet Take 180 mg by mouth daily.    losartan (COZAAR) 50 MG tablet TAKE TWO TABLETS BY MOUTH DAILY   metoprolol succinate (TOPROL-XL) 25 MG 24 hr tablet TAKE ONE TABLET BY MOUTH DAILY   nitroGLYCERIN (NITROSTAT) 0.4 MG SL tablet Place 1 tablet (0.4 mg total) under the tongue every 5 (five) minutes as needed for chest pain.   sertraline (ZOLOFT) 25 MG tablet Take 25 mg by mouth daily.   Current Facility-Administered Medications (Other)  Medication Route   lidocaine-EPINEPHrine (XYLOCAINE W/EPI) 1 %-1:100000 (with pres) injection 10 mL Infiltration      REVIEW OF SYSTEMS:    ALLERGIES Allergies  Allergen Reactions   Nsaids Hives, Shortness Of Breath and Other (See Comments)    Extreme hives and difficulty breathing. "Has had to go to the ER."   Salicylates Other (See Comments)    (aspirin) Extreme hives and difficulty breathing. "Has had to go to the ER."   Hctz [Hydrochlorothiazide] Other (See Comments)    unknown   Codeine Nausea Only   Etodolac Rash   Hydrochlorothiazide Other (See Comments)    Hyponatremia    PAST MEDICAL HISTORY Past Medical History:  Diagnosis Date   Allergy    Anemia 1984   before hysterectomy   Breast cancer ([place])  bilateral   GERD (gastroesophageal reflux disease)    History of blood product transfusion 1984   Hyperlipidemia    Hypertension    Macular degeneration    right eye   Myocardial infarction ([place])    Osteopenia    Personal history of radiation therapy    Takotsubo cardiomyopathy    Past Surgical History:  Procedure Laterality Date   ABDOMINAL HYSTERECTOMY  1984   APPENDECTOMY     BREAST BIOPSY Right 04/29/05   right    BREAST BIOPSY Left 2006   BREAST LUMPECTOMY Left 04/19/04   left with sentinel node   BREAST LUMPECTOMY Right 2007   CATARACT EXTRACTION     bilateral   EYE SURGERY     LEFT HEART CATHETERIZATION WITH CORONARY ANGIOGRAM N/A 07/26/2013   Procedure: LEFT HEART CATHETERIZATION WITH CORONARY ANGIOGRAM;  Surgeon: Jettie Booze, MD;   Location[facility] C[facility] LAB;  Service: Cardiovascular;  Laterality: N/A;   RETINAL LASER PROCEDURE     right    FAMILY HISTORY Family History  Problem Relation Age of Onset   Dementia Mother    Stroke Mother    Heart disease Mother 101   Cancer Father        Prostate   Heart disease Father 48   Cancer Maternal Aunt        Breast   Breast cancer Maternal Aunt    Cancer Maternal Grandmother    Breast cancer Maternal Grandmother    Heart attack Maternal Grandfather    Heart attack Paternal Grandfather     SOCIAL HISTORY Social History   Tobacco Use   Smoking status: Former    Types: Cigarettes    Quit date: 1990    Years since quitting: 33.1   Smokeless tobacco: Never  Vaping Use   Vaping Use: Never used  Substance Use Topics   Alcohol use: Yes    Comment: 3 drinks per night, "too much"   Drug use: No         OPHTHALMIC EXAM:  Base Eye Exam     Visual Acuity (ETDRS)       Right Left   Dist cc 20/50 -2 20/25 -2   Dist ph cc NI     Correction: Glasses         Tonometry (Tonopen, 1:50 PM)       Right Left   Pressure 21 18         Pupils       Pupils Dark Light APD   Right PERRL 4 3 None   Left PERRL 4 3 None         Visual Fields (Counting fingers)       Left Right    Full Full         Extraocular Movement       Right Left    Full Full         Neuro/Psych     Oriented x3: Yes   Mood/Affect: Normal         Dilation     Both eyes: 1.0% Mydriacyl, 2.5% Phenylephrine @ 1:50 PM           Slit Lamp and Fundus Exam     External Exam       Right Left   External Normal Normal         Slit Lamp Exam       Right Left   Lids/Lashes Normal Normal   Conjunctiva/Sclera White and quiet White  and quiet   Cornea Clear Clear   Anterior Chamber Deep and quiet Deep and quiet   Iris Round and reactive Round and reactive   Lens Posterior chamber intraocular lens Posterior chamber intraocular lens   Anterior Vitreous Normal  Normal         Fundus Exam       Right Left   Posterior Vitreous Clear, vitrectomized Normal   Disc Normal Normal   C/D Ratio 0.45 0.2   Macula Hard drusen, Soft drusen, no macular thickening, no hemorrhage, Intermediate age related macular degeneration Hard drusen, Soft drusen, no macular thickening, no hemorrhage, Intermediate age related macular degeneration   Vessels Normal Normal   Periphery Normal Normal           Refraction     Wearing Rx     Age: <62yr            IMAGING AND PROCEDURES  Imaging and Procedures for 05/06/21  OCT, Retina - OU - Both Eyes       Right Eye Quality was good. Scan locations included subfoveal. Central Foveal Thickness: 264. Findings include no IRF, abnormal foveal contour.   Left Eye Quality was good. Scan locations included subfoveal. Central Foveal Thickness: 212. Progression has been stable. Findings include abnormal foveal contour, retinal drusen , no SRF, no IRF.   Notes No signs of active CN VM OU     Color Fundus Photography Optos - OU - Both Eyes       Right Eye Progression has been stable. Disc findings include normal observations. Macula : drusen, geographic atrophy. Vessels : normal observations. Periphery : normal observations.   Left Eye Progression has been stable. Disc findings include normal observations. Macula : drusen, geographic atrophy. Vessels : normal observations. Periphery : normal observations.   Notes No signs of active maculopathy OU, dry AMD remains.             ASSESSMENT/PLAN:  Exudative age-related macular degeneration of right eye with inactive choroidal neovascularization (HCC) No sign of CN VM recurrence OD  Intermediate stage nonexudative age-related macular degeneration of both eyes Stable overall no sign of CN VM  History of vitrectomy History of macular hole closure OD, acuity stable     ICD-10-CM   1. Exudative age-related macular degeneration of right eye with  inactive choroidal neovascularization (HCC)  H35.3212 OCT, Retina - OU - Both Eyes    Color Fundus Photography Optos - OU - Both Eyes    2. Intermediate stage nonexudative age-related macular degeneration of both eyes  H35.3132     3. History of vitrectomy  Z98.890       1.  OU with dry AMD, no signs of wet AMD, no signs of CNVM  2.  Follow-up Dr. Herbert Deaner as scheduled  3.  Ophthalmic Meds Ordered this visit:  No orders of the defined types were placed in this encounter.      Return in about 1 year (around 05/06/2022) for DILATE OU, COLOR FP, OCT.  There are no Patient Instructions on file for this visit.   Explained the diagnoses, plan, and follow up with the patient and they expressed understanding.  Patient expressed understanding of the importance of proper follow up care.   Clent Demark Rowland Ericsson M.D. Diseases & Surgery of the Retina and Vitreous [facility] [place] 05/06/21     Abbreviations: M myopia (nearsighted); A astigmatism; H hyperopia (farsighted); P presbyopia; Mrx spectacle prescription;  CTL contact lenses; OD right eye;  OS left eye; OU both eyes  XT exotropia; ET esotropia; PEK punctate epithelial keratitis; PEE punctate epithelial erosions; DES dry eye syndrome; MGD meibomian gland dysfunction; ATs artificial tears; PFAT's preservative free artificial tears; [place] nuclear sclerotic cataract; PSC posterior subcapsular cataract; ERM epi-retinal membrane; PVD posterior vitreous detachment; RD retinal detachment; DM diabetes mellitus; DR diabetic retinopathy; NPDR non-proliferative diabetic retinopathy; PDR proliferative diabetic retinopathy; CSME clinically significant macular edema; DME diabetic macular edema; dbh dot blot hemorrhages; CWS cotton wool spot; POAG primary open angle glaucoma; C/D cup-to-disc ratio; HVF humphrey visual field; GVF goldmann visual field; OCT optical coherence tomography; IOP intraocular pressure; BRVO Branch retinal vein occlusion; CRVO  central retinal vein occlusion; CRAO central retinal artery occlusion; BRAO branch retinal artery occlusion; RT retinal tear; SB scleral buckle; PPV pars plana vitrectomy; VH Vitreous hemorrhage; PRP panretinal laser photocoagulation; IVK intravitreal kenalog; VMT vitreomacular traction; MH Macular hole;  NVD neovascularization of the disc; NVE neovascularization elsewhere; AREDS age related eye disease study; ARMD age related macular degeneration; POAG primary open angle glaucoma; EBMD epithelial/anterior basement membrane dystrophy; ACIOL anterior chamber intraocular lens; IOL intraocular lens; PCIOL posterior chamber intraocular lens; Phaco/IOL phacoemulsification with intraocular lens placement; [place] photorefractive keratectomy; LASIK laser assisted in situ keratomileusis; HTN hypertension; DM diabetes mellitus; COPD chronic obstructive pulmonary disease

## 2021-05-06 NOTE — Assessment & Plan Note (Signed)
History of macular hole closure OD, acuity stable

## 2021-05-06 NOTE — Assessment & Plan Note (Signed)
No sign of CN VM recurrence OD

## 2021-05-07 ENCOUNTER — Other Ambulatory Visit: Payer: Self-pay

## 2021-05-07 ENCOUNTER — Emergency Department (HOSPITAL_COMMUNITY)
Admission: EM | Admit: 2021-05-07 | Discharge: 2021-05-07 | Disposition: A | Discharge status: Home / Self Care | Payer: Medicare Other | Attending: Emergency Medicine | Admitting: Emergency Medicine

## 2021-05-07 ENCOUNTER — Emergency Department (HOSPITAL_COMMUNITY): Payer: Medicare Other

## 2021-05-07 ENCOUNTER — Encounter (HOSPITAL_COMMUNITY): Payer: Self-pay

## 2021-05-07 DIAGNOSIS — W19XXXA Unspecified fall, initial encounter: Secondary | ICD-10-CM

## 2021-05-07 DIAGNOSIS — M25561 Pain in right knee: Secondary | ICD-10-CM | POA: Insufficient documentation

## 2021-05-07 DIAGNOSIS — R9431 Abnormal electrocardiogram [ECG] [EKG]: Secondary | ICD-10-CM | POA: Diagnosis not present

## 2021-05-07 DIAGNOSIS — S0083XA Contusion of other part of head, initial encounter: Secondary | ICD-10-CM | POA: Diagnosis not present

## 2021-05-07 DIAGNOSIS — R195 Other fecal abnormalities: Secondary | ICD-10-CM | POA: Diagnosis not present

## 2021-05-07 DIAGNOSIS — Z79899 Other long term (current) drug therapy: Secondary | ICD-10-CM | POA: Insufficient documentation

## 2021-05-07 DIAGNOSIS — S0990XA Unspecified injury of head, initial encounter: Secondary | ICD-10-CM | POA: Diagnosis not present

## 2021-05-07 DIAGNOSIS — S199XXA Unspecified injury of neck, initial encounter: Secondary | ICD-10-CM | POA: Diagnosis not present

## 2021-05-07 DIAGNOSIS — M1711 Unilateral primary osteoarthritis, right knee: Secondary | ICD-10-CM | POA: Diagnosis not present

## 2021-05-07 DIAGNOSIS — W010XXA Fall on same level from slipping, tripping and stumbling without subsequent striking against object, initial encounter: Secondary | ICD-10-CM | POA: Insufficient documentation

## 2021-05-07 DIAGNOSIS — S0003XA Contusion of scalp, initial encounter: Secondary | ICD-10-CM | POA: Diagnosis not present

## 2021-05-07 DIAGNOSIS — I10 Essential (primary) hypertension: Secondary | ICD-10-CM | POA: Diagnosis not present

## 2021-05-07 DIAGNOSIS — S80919A Unspecified superficial injury of unspecified knee, initial encounter: Secondary | ICD-10-CM | POA: Diagnosis not present

## 2021-05-07 DIAGNOSIS — Z7901 Long term (current) use of anticoagulants: Secondary | ICD-10-CM | POA: Insufficient documentation

## 2021-05-07 DIAGNOSIS — S8991XA Unspecified injury of right lower leg, initial encounter: Secondary | ICD-10-CM | POA: Diagnosis not present

## 2021-05-07 DIAGNOSIS — R519 Headache, unspecified: Secondary | ICD-10-CM | POA: Diagnosis not present

## 2021-05-07 LAB — CBC WITH DIFFERENTIAL/PLATELET
Abs Immature Granulocytes: 0.02 10*3/uL (ref 0.00–0.07)
Basophils Absolute: 0 10*3/uL (ref 0.0–0.1)
Basophils Relative: 1 %
Eosinophils Absolute: 0.1 10*3/uL (ref 0.0–0.5)
Eosinophils Relative: 1 %
HCT: 40.4 % (ref 36.0–46.0)
Hemoglobin: 12.9 g/dL (ref 12.0–15.0)
Immature Granulocytes: 0 %
Lymphocytes Relative: 19 %
Lymphs Abs: 1.4 10*3/uL (ref 0.7–4.0)
MCH: 33.7 pg (ref 26.0–34.0)
MCHC: 31.9 g/dL (ref 30.0–36.0)
MCV: 105.5 fL — ABNORMAL HIGH (ref 80.0–100.0)
Monocytes Absolute: 0.7 10*3/uL (ref 0.1–1.0)
Monocytes Relative: 9 %
Neutro Abs: 5.4 10*3/uL (ref 1.7–7.7)
Neutrophils Relative %: 70 %
Platelets: 259 10*3/uL (ref 150–400)
RBC: 3.83 MIL/uL — ABNORMAL LOW (ref 3.87–5.11)
RDW: 12.4 % (ref 11.5–15.5)
WBC: 7.7 10*3/uL (ref 4.0–10.5)
nRBC: 0 % (ref 0.0–0.2)

## 2021-05-07 LAB — I-STAT CHEM 8, ED
BUN: 13 mg/dL (ref 8–23)
Calcium, Ion: 1.2 mmol/L (ref 1.15–1.40)
Chloride: 97 mmol/L — ABNORMAL LOW (ref 98–111)
Creatinine, Ser: 0.6 mg/dL (ref 0.44–1.00)
Glucose, Bld: 97 mg/dL (ref 70–99)
HCT: 41 % (ref 36.0–46.0)
Hemoglobin: 13.9 g/dL (ref 12.0–15.0)
Potassium: 4 mmol/L (ref 3.5–5.1)
Sodium: 139 mmol/L (ref 135–145)
TCO2: 31 mmol/L (ref 22–32)

## 2021-05-07 MED ORDER — ACETAMINOPHEN 500 MG PO TABS
1000.0000 mg | ORAL_TABLET | Freq: Once | ORAL | Status: AC
  Administered 2021-05-07: 1000 mg via ORAL
  Filled 2021-05-07: qty 2

## 2021-05-07 NOTE — Progress Notes (Signed)
Orthopedic Tech Progress Note Patient Details:  Melinda [name] 10/24/1934 [id number]  Level 2 trauma  Patient ID: [name], female   DOB: [birth year], 87 y.o.   MRN: [id number]  Janit Pagan 05/07/2021, 12:28 PM

## 2021-05-07 NOTE — Progress Notes (Signed)
Carelink Summary Report / Loop Recorder 

## 2021-05-07 NOTE — Discharge Instructions (Signed)
You have been seen and discharged from the emergency department.  Your x-ray imaging was negative for fracture.  The CT of the head and neck showed no acute abnormality.  You have a hematoma on the right forehead, you may apply gentle compression to this area and ice to reduce the swelling.  You may experience further bruising over the next couple days.  Follow-up with your primary provider for further evaluation and further care. Take home medications as prescribed. If you have any worsening symptoms or further concerns for your health please return to an emergency department for further evaluation.

## 2021-05-07 NOTE — ED Triage Notes (Signed)
Walking in the Parking lot, did not see the curve, tripped over. Hit the knee and head. NO LOC. No back or  neck pain. R side hematoma noted on forehead. Pt on eliquis. A&O X4.   BP 170/100  Hr 90  98% RA  18 RR

## 2021-05-07 NOTE — ED Provider Notes (Signed)
[facility] EMERGENCY DEPARTMENT Provider Note   CSN: [id number] Arrival date & time: 05/07/21  1149     History  Chief Complaint  Patient presents with   level 2 fall on thinner     Melinda [name] is a 87 y.o. female.  HPI  87 year old female presents the emergency department through trouble for fall with head injury on Eliquis.  Patient was walking in a parking lot, something flew into her eye and this caused her to trip and fall forward hitting the right side of her head.  No reported loss of consciousness.  She landed on her right knee as well.  She is complaining of right knee pain and right-sided headache but denies any neck pain, back pain, other extremity injury or pain.  No syncope.  Has been compliant with her medications, otherwise in her baseline health status.  Home Medications Prior to Admission medications   Medication Sig Start Date End Date Taking? Authorizing Provider  acetaminophen (TYLENOL) 500 MG tablet Take 1,000 mg by mouth at bedtime as needed (for pain, headaches, or fever).     [provider]  albuterol (VENTOLIN HFA) 108 (90 Base) MCG/ACT inhaler Inhale 1-2 puffs into the lungs every 6 (six) hours as needed for wheezing or shortness of breath.    [provider]  atorvastatin (LIPITOR) 20 MG tablet Take 20 mg by mouth at bedtime.    [provider]  cholecalciferol (VITAMIN D) 1000 units tablet Take 1,000 Units by mouth daily.    [provider]  ELIQUIS 5 MG TABS tablet TAKE ONE TABLET BY MOUTH TWICE A DAY Patient taking differently: Take 5 mg by mouth 2 (two) times daily. 04/15/21   Croitoru, Mihai, MD  fexofenadine (ALLEGRA) 180 MG tablet Take 180 mg by mouth daily.    [provider]  latanoprost (XALATAN) 0.005 % ophthalmic solution Place 1 drop into both eyes at bedtime.  05/18/19   [provider]  losartan (COZAAR) 50 MG tablet TAKE TWO TABLETS BY MOUTH DAILY Patient not  taking: Reported on 05/07/2021 02/04/21   Croitoru, Mihai, MD  metoprolol succinate (TOPROL-XL) 25 MG 24 hr tablet TAKE ONE TABLET BY MOUTH DAILY Patient not taking: Reported on 05/07/2021 02/29/20   Croitoru, Mihai, MD  metoprolol succinate (TOPROL-XL) 50 MG 24 hr tablet Take 50 mg by mouth daily. 05/05/21   [provider]  nitroGLYCERIN (NITROSTAT) 0.4 MG SL tablet Place 1 tablet (0.4 mg total) under the tongue every 5 (five) minutes as needed for chest pain. 07/13/19   Croitoru, Mihai, MD  sertraline (ZOLOFT) 25 MG tablet Take 25 mg by mouth daily. 09/05/19   [provider]  timolol (TIMOPTIC) 0.5 % ophthalmic solution Place 1 drop into the right eye every morning.  01/31/19   [provider]      Allergies    Nsaids, Salicylates, Hctz [hydrochlorothiazide], Codeine, Etodolac, and Hydrochlorothiazide    Review of Systems   Review of Systems  Constitutional:  Negative for fever.  Respiratory:  Negative for shortness of breath.   Cardiovascular:  Negative for chest pain.  Gastrointestinal:  Negative for abdominal pain.  Musculoskeletal:  Negative for back pain and neck stiffness.       + right knee pain  Skin:  Negative for rash.  Neurological:  Positive for headaches.   Physical Exam Updated Vital Signs BP 140/80 (BP Location: Right Arm)    Pulse 66    Temp 98.3 F (36.8 C) (Oral)  Resp 15    Ht 5' (1.524 m)    Wt 68 kg    SpO2 100%    BMI 29.29 kg/m  Physical Exam Vitals and nursing note reviewed.  Constitutional:      General: She is not in acute distress.    Appearance: Normal appearance.  HENT:     Head: Normocephalic.     Comments: Right frontal hematoma with abrasion, no bleeding    Mouth/Throat:     Mouth: Mucous membranes are moist.  Eyes:     Pupils: Pupils are equal, round, and reactive to light.  Cardiovascular:     Rate and Rhythm: Normal rate.  Pulmonary:     Effort: Pulmonary effort is normal. No respiratory distress.  Abdominal:      Palpations: Abdomen is soft.     Tenderness: There is no abdominal tenderness.  Musculoskeletal:        General: No swelling or deformity.     Cervical back: No rigidity or tenderness.     Comments: TTP of right knee  Skin:    General: Skin is warm.  Neurological:     Mental Status: She is alert and oriented to person, place, and time. Mental status is at baseline.  Psychiatric:        Mood and Affect: Mood normal.    ED Results / Procedures / Treatments   Labs (all labs ordered are listed, but only abnormal results are displayed) Labs Reviewed  CBC WITH DIFFERENTIAL/PLATELET - Abnormal; Notable for the following components:      Result Value   RBC 3.83 (*)    MCV 105.5 (*)    All other components within normal limits  I-STAT CHEM 8, ED - Abnormal; Notable for the following components:   Chloride 97 (*)    All other components within normal limits    EKG None  Radiology CT Head Wo Contrast  Result Date: 05/07/2021 CLINICAL DATA:  Head trauma, minor (Age >= 65y); Polytrauma, blunt EXAM: CT HEAD WITHOUT CONTRAST CT CERVICAL SPINE WITHOUT CONTRAST TECHNIQUE: Multidetector CT imaging of the head and cervical spine was performed following the standard protocol without intravenous contrast. Multiplanar CT image reconstructions of the cervical spine were also generated. RADIATION DOSE REDUCTION: This exam was performed according to the departmental dose-optimization program which includes automated exposure control, adjustment of the mA and/or kV according to patient size and/or use of iterative reconstruction technique. COMPARISON:  03/19/2019, 03/04/2019 FINDINGS: CT HEAD FINDINGS Brain: No evidence of acute infarction, hemorrhage, hydrocephalus, extra-axial collection or mass lesion/mass effect. Mild low-density changes within the periventricular and subcortical white matter compatible with chronic microvascular ischemic change. Mild diffuse cerebral volume loss. Vascular:  Atherosclerotic calcifications involving the large vessels of the skull base. No unexpected hyperdense vessel. Skull: Normal. Negative for fracture or focal lesion. Sinuses/Orbits: No acute finding. Other: Small focal scalp hematoma overlying the right frontal region. CT CERVICAL SPINE FINDINGS Alignment: Facet joints are aligned without dislocation or traumatic listhesis. Dens and lateral masses are aligned. Skull base and vertebrae: No acute fracture. No primary bone lesion or focal pathologic process. Soft tissues and spinal canal: No prevertebral fluid or swelling. No visible canal hematoma. Disc levels: Moderate multilevel intervertebral disc height loss. Advanced multilevel facet arthropathy, most severe at C4-5 on the left. Upper chest: Included lung apices are clear. Other: Bilateral carotid atherosclerosis. IMPRESSION: 1. No acute intracranial abnormality. 2. Small focal scalp hematoma overlying the right frontal region. Negative for calvarial fracture. 3. No acute fracture  or subluxation of the cervical spine. 4. Moderate multilevel degenerative disc disease and facet arthropathy. Electronically Signed   By: Davina Poke D.O.   On: 05/07/2021 13:16   CT Cervical Spine Wo Contrast  Result Date: 05/07/2021 CLINICAL DATA:  Head trauma, minor (Age >= 65y); Polytrauma, blunt EXAM: CT HEAD WITHOUT CONTRAST CT CERVICAL SPINE WITHOUT CONTRAST TECHNIQUE: Multidetector CT imaging of the head and cervical spine was performed following the standard protocol without intravenous contrast. Multiplanar CT image reconstructions of the cervical spine were also generated. RADIATION DOSE REDUCTION: This exam was performed according to the departmental dose-optimization program which includes automated exposure control, adjustment of the mA and/or kV according to patient size and/or use of iterative reconstruction technique. COMPARISON:  03/19/2019, 03/04/2019 FINDINGS: CT HEAD FINDINGS Brain: No evidence of acute  infarction, hemorrhage, hydrocephalus, extra-axial collection or mass lesion/mass effect. Mild low-density changes within the periventricular and subcortical white matter compatible with chronic microvascular ischemic change. Mild diffuse cerebral volume loss. Vascular: Atherosclerotic calcifications involving the large vessels of the skull base. No unexpected hyperdense vessel. Skull: Normal. Negative for fracture or focal lesion. Sinuses/Orbits: No acute finding. Other: Small focal scalp hematoma overlying the right frontal region. CT CERVICAL SPINE FINDINGS Alignment: Facet joints are aligned without dislocation or traumatic listhesis. Dens and lateral masses are aligned. Skull base and vertebrae: No acute fracture. No primary bone lesion or focal pathologic process. Soft tissues and spinal canal: No prevertebral fluid or swelling. No visible canal hematoma. Disc levels: Moderate multilevel intervertebral disc height loss. Advanced multilevel facet arthropathy, most severe at C4-5 on the left. Upper chest: Included lung apices are clear. Other: Bilateral carotid atherosclerosis. IMPRESSION: 1. No acute intracranial abnormality. 2. Small focal scalp hematoma overlying the right frontal region. Negative for calvarial fracture. 3. No acute fracture or subluxation of the cervical spine. 4. Moderate multilevel degenerative disc disease and facet arthropathy. Electronically Signed   By: Davina Poke D.O.   On: 05/07/2021 13:16   DG Knee Complete 4 Views Right  Result Date: 05/07/2021 CLINICAL DATA:  Trauma, fall EXAM: RIGHT KNEE - COMPLETE 4+ VIEW COMPARISON:  None. FINDINGS: No recent fracture or dislocation is seen. Degenerative changes are noted with bony spurs in the medial, lateral and patellofemoral compartments, more severe in the medial compartment. Possible chondrocalcinosis is seen in the meniscal cartilages. There is no significant effusion. IMPRESSION: No recent fracture or dislocation is seen in the  right knee. Degenerative changes are noted, more severe in the medial compartment. Electronically Signed   By: Elmer Picker M.D.   On: 05/07/2021 13:04   OCT, Retina - OU - Both Eyes  Result Date: 05/06/2021 Right Eye Quality was good. Scan locations included subfoveal. Central Foveal Thickness: 264. Findings include no IRF, abnormal foveal contour. Left Eye Quality was good. Scan locations included subfoveal. Central Foveal Thickness: 212. Progression has been stable. Findings include abnormal foveal contour, retinal drusen , no SRF, no IRF. Notes No signs of active CN VM OU  Color Fundus Photography Optos - OU - Both Eyes  Result Date: 05/06/2021 Right Eye Progression has been stable. Disc findings include normal observations. Macula : drusen, geographic atrophy. Vessels : normal observations. Periphery : normal observations. Left Eye Progression has been stable. Disc findings include normal observations. Macula : drusen, geographic atrophy. Vessels : normal observations. Periphery : normal observations. Notes No signs of active maculopathy OU, dry AMD remains.   Procedures Procedures    Medications Ordered in ED Medications - No data  to display  ED Course/ Medical Decision Making/ A&P                           Medical Decision Making Amount and/or Complexity of Data Reviewed Labs: ordered. Radiology: ordered.   87 year old female presents emergency department as a level 2 trauma for fall with head injury on anticoagulation.  Vitals are stable on arrival.  Patient describes mechanical fall with no loss of consciousness.  No history of syncope.  Main complaint is right knee pain and headache.  No other signs of traumatic injury.  Otherwise in her baseline health status and compliant with medications.  Blood work is baseline and reassuring.  CT of the head and neck is unremarkable.  X-ray of the knee shows no acute fracture.  Patient is ambulatory, baseline neurologic status.  Plan  for outpatient follow-up.  Patient at this time appears safe and stable for discharge and close outpatient follow up. Discharge plan and strict return to ED precautions discussed, patient verbalizes understanding and agreement.        Final Clinical Impression(s) / ED Diagnoses Final diagnoses:  None    Rx / DC Orders ED Discharge Orders     None         Lorelle Gibbs, DO 05/07/21 1331

## 2021-05-07 NOTE — ED Notes (Signed)
Patient transported to X-ray 

## 2021-05-15 ENCOUNTER — Ambulatory Visit
Admission: RE | Admit: 2021-05-15 | Discharge: 2021-05-15 | Disposition: A | Discharge status: Home / Self Care | Payer: Medicare Other | Source: Ambulatory Visit | Attending: Gastroenterology | Admitting: Gastroenterology

## 2021-05-15 ENCOUNTER — Other Ambulatory Visit: Payer: Self-pay | Admitting: Gastroenterology

## 2021-05-15 ENCOUNTER — Other Ambulatory Visit: Payer: Self-pay

## 2021-05-15 DIAGNOSIS — K5900 Constipation, unspecified: Secondary | ICD-10-CM | POA: Diagnosis not present

## 2021-06-03 ENCOUNTER — Ambulatory Visit (INDEPENDENT_AMBULATORY_CARE_PROVIDER_SITE_OTHER): Payer: Medicare Other

## 2021-06-03 DIAGNOSIS — R55 Syncope and collapse: Secondary | ICD-10-CM | POA: Diagnosis not present

## 2021-06-03 DIAGNOSIS — R195 Other fecal abnormalities: Secondary | ICD-10-CM | POA: Diagnosis not present

## 2021-06-04 LAB — CUP PACEART REMOTE DEVICE CHECK
Date Time Interrogation Session: 20230303230801
Implantable Pulse Generator Implant Date: 20210111

## 2021-06-17 NOTE — Progress Notes (Signed)
Carelink Summary Report / Loop Recorder 

## 2021-06-24 DIAGNOSIS — F39 Unspecified mood [affective] disorder: Secondary | ICD-10-CM | POA: Diagnosis not present

## 2021-06-24 DIAGNOSIS — M8588 Other specified disorders of bone density and structure, other site: Secondary | ICD-10-CM | POA: Diagnosis not present

## 2021-06-24 DIAGNOSIS — R7303 Prediabetes: Secondary | ICD-10-CM | POA: Diagnosis not present

## 2021-07-08 ENCOUNTER — Ambulatory Visit (INDEPENDENT_AMBULATORY_CARE_PROVIDER_SITE_OTHER): Payer: Medicare Other

## 2021-07-08 DIAGNOSIS — R55 Syncope and collapse: Secondary | ICD-10-CM

## 2021-07-10 LAB — CUP PACEART REMOTE DEVICE CHECK
Date Time Interrogation Session: 20230412072539
Implantable Pulse Generator Implant Date: 20210111

## 2021-07-24 NOTE — Progress Notes (Signed)
Carelink Summary Report / Loop Recorder 

## 2021-08-03 ENCOUNTER — Other Ambulatory Visit: Payer: Self-pay | Admitting: Cardiovascular Disease

## 2021-08-06 ENCOUNTER — Telehealth: Payer: Self-pay

## 2021-08-06 ENCOUNTER — Ambulatory Visit
Admission: RE | Admit: 2021-08-06 | Discharge: 2021-08-06 | Disposition: A | Discharge status: Home / Self Care | Payer: Medicare Other | Source: Ambulatory Visit | Attending: Gastroenterology | Admitting: Gastroenterology

## 2021-08-06 DIAGNOSIS — Z86010 Personal history of colonic polyps: Secondary | ICD-10-CM

## 2021-08-06 NOTE — Telephone Encounter (Signed)
Spoke with Pt. ? ?She remembers this episode of heart racing.  She was doing a bowel prep for a virtual colonoscopy.  She thinks the medications she had to take may have contributed. ? ?Pt due for yearly follow up.  Pt scheduled to see Dr. Loletha Grayer 08/19/2021 at 9:40 am. ? ?Pt thanked nurse for call back. ?

## 2021-08-06 NOTE — Telephone Encounter (Signed)
LINQ alert received.  ?1 tachy event 5/8 @ 21:13, EGM shows SR/ST,  followed by regular tachy rhythm, mean HR 167, duration 31mn 38sec ? ?Left message for Pt requesting call back to ascertain possible symptoms and to advise of appt with DR. MPueblitoon 08/12/2021 at 8:00 am for yearly f/u. ? ? ? ? ?

## 2021-08-12 ENCOUNTER — Encounter: Payer: Medicare Other | Admitting: Cardiovascular Disease

## 2021-08-12 ENCOUNTER — Ambulatory Visit (INDEPENDENT_AMBULATORY_CARE_PROVIDER_SITE_OTHER): Payer: Medicare Other

## 2021-08-12 DIAGNOSIS — R55 Syncope and collapse: Secondary | ICD-10-CM | POA: Diagnosis not present

## 2021-08-14 LAB — CUP PACEART REMOTE DEVICE CHECK
Date Time Interrogation Session: 20230517110253
Implantable Pulse Generator Implant Date: 20210111

## 2021-08-19 ENCOUNTER — Encounter: Payer: Self-pay | Admitting: Cardiovascular Disease

## 2021-08-19 ENCOUNTER — Ambulatory Visit (INDEPENDENT_AMBULATORY_CARE_PROVIDER_SITE_OTHER): Payer: Medicare Other | Admitting: Cardiovascular Disease

## 2021-08-19 VITALS — BP 190/80 | HR 61 | Ht 62.0 in | Wt 159.2 lb

## 2021-08-19 DIAGNOSIS — I10 Essential (primary) hypertension: Secondary | ICD-10-CM | POA: Diagnosis not present

## 2021-08-19 DIAGNOSIS — I2510 Atherosclerotic heart disease of native coronary artery without angina pectoris: Secondary | ICD-10-CM | POA: Diagnosis not present

## 2021-08-19 DIAGNOSIS — Z95818 Presence of other cardiac implants and grafts: Secondary | ICD-10-CM

## 2021-08-19 DIAGNOSIS — Z87898 Personal history of other specified conditions: Secondary | ICD-10-CM | POA: Diagnosis not present

## 2021-08-19 DIAGNOSIS — D6869 Other thrombophilia: Secondary | ICD-10-CM | POA: Diagnosis not present

## 2021-08-19 DIAGNOSIS — E7800 Pure hypercholesterolemia, unspecified: Secondary | ICD-10-CM

## 2021-08-19 DIAGNOSIS — I471 Supraventricular tachycardia: Secondary | ICD-10-CM

## 2021-08-19 DIAGNOSIS — I480 Paroxysmal atrial fibrillation: Secondary | ICD-10-CM | POA: Diagnosis not present

## 2021-08-19 MED ORDER — AMLODIPINE BESYLATE 2.5 MG PO TABS: 2.5000 mg | ORAL_TABLET | Freq: Every day | ORAL | 1 refills | Status: DC

## 2021-08-19 NOTE — Progress Notes (Unsigned)
Cardiology Office Note    Date:  08/22/2021   ID:  Melinda, [name] 04-30-1934, MRN [id number]  PCP:  [name], MD  Cardiologist:   Sanda Klein, MD   Chief Complaint  Patient presents with   Irregular Heart Beat    History of Present Illness:  Melinda [name] is a 87 y.o. female with recent recurrent syncope leading to implantable loop recorder, history of paroxysmal atrial fibrillation, takotsubo cardiomyopathy with complete resolution, minor coronary artery disease with 75% stenosis of a first oblique marginal artery (asymptomatic). Last echo in October 2019 showed normal LVEF 55-60%.  She had a lengthy episode of paroxysmal atrial tachycardia at 167 bpm, lasting for about 60 minutes.  This occurred on May 8 while she was undergoing prep for colonoscopy.  She did not have atrial fibrillation.  She's done better with gait stability over the last 3 months but she had a serious fall in the parking lot in February where she hit her head.  This was not associated with syncope, but sounds like a mechanical fall.  Head CT showed a scalp hematoma in the right frontal region, but no fracture or intracranial bleeding.  There was evidence of moderate multilevel degenerative disc disease and arthritis in her spine.  A previous CT angiogram has shown relatively mild intracranial arterial atherosclerotic stenoses.  Her loop recorder showed an episode of atrial fibrillation with controlled ventricular rate (average 98 bpm) on March 31, 2020, lasting for about 6 hours.  She has not had any new true atrial fibrillation since then.   The patient specifically denies any chest pain at rest exertion, dyspnea at rest or with exertion, orthopnea, paroxysmal nocturnal dyspnea, syncope, palpitations, focal neurological deficits, intermittent claudication, lower extremity edema, unexplained weight gain, cough, hemoptysis or wheezing. She has not had any serious bleeding events.  She has not  had epistaxis this year, like she had in 2021.  Her blood pressure is quite high today.  Does not seem to be bothering her.  She reports being very anxious recently.  Her daughter has been diagnosed with breast cancer and has a genetic abnormality (Lynch syndrome) that increases the risk of cancer in other family members, so everybody is being tested.  On top of this her son-in-law was diagnosed with a bone marrow malignancy and is undergoing bone marrow transplant.  Her husband Melinda [name] is having serious back problems.  CT angiogram of the head performed October 2021 showed mild diffuse cerebral atrophy with ex vacuo dilatation no evidence of recent stroke or intracranial hemorrhage, but with chronic microvascular ischemic changes.  There was mild carotid siphon and V4 segment atheromatous disease with only mild luminal narrowing.   Past Medical History:  Diagnosis Date   Allergy    Anemia 1984   before hysterectomy   Breast cancer ([place])    bilateral   GERD (gastroesophageal reflux disease)    History of blood product transfusion 1984   Hyperlipidemia    Hypertension    Macular degeneration    right eye   Myocardial infarction ([place])    Osteopenia    Personal history of radiation therapy    Takotsubo cardiomyopathy     Past Surgical History:  Procedure Laterality Date   ABDOMINAL HYSTERECTOMY  1984   APPENDECTOMY     BREAST BIOPSY Right 04/29/05   right    BREAST BIOPSY Left 2006   BREAST LUMPECTOMY Left 04/19/04   left with sentinel node   BREAST LUMPECTOMY Right 2007  CATARACT EXTRACTION     bilateral   EYE SURGERY     LEFT HEART CATHETERIZATION WITH CORONARY ANGIOGRAM N/A 07/26/2013   Procedure: LEFT HEART CATHETERIZATION WITH CORONARY ANGIOGRAM;  Surgeon: Jettie Booze, MD;  Location: [facility] CATH LAB;  Service: Cardiovascular;  Laterality: N/A;   RETINAL LASER PROCEDURE     right    Current Medications: Outpatient Medications Prior to Visit  Medication Sig Dispense  Refill   acetaminophen (TYLENOL) 500 MG tablet Take 1,000 mg by mouth at bedtime as needed (for pain, headaches, or fever).      atorvastatin (LIPITOR) 20 MG tablet Take 20 mg by mouth at bedtime.     cholecalciferol (VITAMIN D) 1000 units tablet Take 1,000 Units by mouth daily.     ELIQUIS 5 MG TABS tablet TAKE ONE TABLET BY MOUTH TWICE A DAY (Patient taking differently: Take 5 mg by mouth 2 (two) times daily.) 180 tablet 1   fexofenadine (ALLEGRA) 180 MG tablet Take 180 mg by mouth daily.     latanoprost (XALATAN) 0.005 % ophthalmic solution Place 1 drop into both eyes at bedtime.      losartan (COZAAR) 50 MG tablet TAKE TWO TABLETS BY MOUTH DAILY 180 tablet 3   metoprolol succinate (TOPROL-XL) 50 MG 24 hr tablet Take 50 mg by mouth daily.     Multiple Vitamins-Minerals (PRESERVISION AREDS 2+MULTI VIT) CAPS Take 1 tablet by mouth daily.     Nutritional Supplements (COLON FORMULA) 166.67-500 MG CAPS Take 1 capsule by mouth daily.     sertraline (ZOLOFT) 25 MG tablet Take 25 mg by mouth daily.     timolol (TIMOPTIC) 0.5 % ophthalmic solution Place 1 drop into the right eye every morning.      albuterol (VENTOLIN HFA) 108 (90 Base) MCG/ACT inhaler Inhale 1-2 puffs into the lungs every 6 (six) hours as needed for wheezing or shortness of breath. (Patient not taking: Reported on 08/19/2021)     nitroGLYCERIN (NITROSTAT) 0.4 MG SL tablet Place 1 tablet (0.4 mg total) under the tongue every 5 (five) minutes as needed for chest pain. (Patient not taking: Reported on 08/19/2021) 25 tablet 3   Facility-Administered Medications Prior to Visit  Medication Dose Route Frequency Provider Last Rate Last Admin   lidocaine-EPINEPHrine (XYLOCAINE W/EPI) 1 %-1:100000 (with pres) injection 10 mL  10 mL Infiltration Once Scout Guyett, MD         Allergies:   Nsaids, Salicylates, Hctz [hydrochlorothiazide], Codeine, Etodolac, and Hydrochlorothiazide   Social History   Socioeconomic History   Marital status:  Married    Spouse name: Herbie [place]   Number of children: 2   Years of education: college   Highest education level: Not on file  Occupational History   Not on file  Tobacco Use   Smoking status: Former    Types: Cigarettes    Quit date: 1990    Years since quitting: 33.4   Smokeless tobacco: Never  Vaping Use   Vaping Use: Never used  Substance and Sexual Activity   Alcohol use: Yes    Comment: 3 drinks per night, "too much"   Drug use: No   Sexual activity: Not on file  Other Topics Concern   Not on file  Social History Narrative   Lives with Spouse and dog   Drinks 3 cups of caffeine daily   Right handed             Social Determinants of Health   Financial Resource Strain: Not on  file  Food Insecurity: Not on file  Transportation Needs: Not on file  Physical Activity: Not on file  Stress: Not on file  Social Connections: Not on file     Family History:  The patient's family history includes Breast cancer in her maternal aunt and maternal grandmother; Cancer in her father, maternal aunt, and maternal grandmother; Dementia in her mother; Heart attack in her maternal grandfather and paternal grandfather; Heart disease (age of onset: 78) in her father; Heart disease (age of onset: 37) in her mother; Stroke in her mother.   ROS:   Please see the history of present illness.    All other systems are reviewed and are negative.   PHYSICAL EXAM:   VS:  BP (!) 190/80 (BP Location: Left Arm, Patient Position: Sitting, Cuff Size: Normal)   Pulse 61   Ht '5\' 2"'$  (1.575 m)   Wt 159 lb 3.2 oz (72.2 kg)   SpO2 97%   BMI 29.12 kg/m      General: Alert, oriented x3, no distress, loop recorder site looks healthy Head: no evidence of trauma, PERRL, EOMI, no exophtalmos or lid lag, no myxedema, no xanthelasma; normal ears, nose and oropharynx Neck: normal jugular venous pulsations and no hepatojugular reflux; brisk carotid pulses without delay and no carotid bruits Chest: clear to  auscultation, no signs of consolidation by percussion or palpation, normal fremitus, symmetrical and full respiratory excursions Cardiovascular: normal position and quality of the apical impulse, regular rhythm, normal first and second heart sounds, no murmurs, rubs or gallops Abdomen: no tenderness or distention, no masses by palpation, no abnormal pulsatility or arterial bruits, normal bowel sounds, no hepatosplenomegaly Extremities: no clubbing, cyanosis or edema; 2+ radial, ulnar and brachial pulses bilaterally; 2+ right femoral, posterior tibial and dorsalis pedis pulses; 2+ left femoral, posterior tibial and dorsalis pedis pulses; no subclavian or femoral bruits Neurological: grossly nonfocal Psych: Normal mood and affect    Wt Readings from Last 3 Encounters:  08/19/21 159 lb 3.2 oz (72.2 kg)  05/07/21 150 lb (68 kg)  07/31/20 163 lb (73.9 kg)      Studies/Labs Reviewed:   ECHO 03/04/2019:  1. Left ventricular ejection fraction, by visual estimation, is 60 to 65%. The left ventricle has normal function. There is no left ventricular hypertrophy.  2. Global right ventricle has normal systolic function.The right ventricular size is normal. No increase in right ventricular wall thickness.  3. Left atrial size was normal.  4. Right atrial size was normal.  5. The mitral valve is grossly normal. No evidence of mitral valve regurgitation.  6. The tricuspid valve is grossly normal. Tricuspid valve regurgitation is not demonstrated.  7. Aortic valve regurgitation is mild to moderate.  8. The aortic valve is tricuspid. Aortic valve regurgitation is mild to moderate. Mild to moderate aortic valve stenosis.  9. The pulmonic valve was grossly normal. Pulmonic valve regurgitation is trivial. 10. The atrial septum is grossly normal.  EKG:  EKG is not ordered today.  ECG is ordered today shows normal sinus rhythm with left atrial abnormality, no repolarization changes, QTC 431 ms.    Her loop  recorder was downloaded in the office today and shows no interval arrhythmia since January 1. Recent Labs: 06/13/2019 hemoglobin A1c 5.3%, creatinine 0.57, potassium 4.0, normal liver function tests 06/14/2020 creatinine 0.74, potassium 3.9, normal liver function tests, hemoglobin A1c 5.4%  06/24/2021 Hemoglobin A1c 5.5%, creatinine 0.71, potassium 4.0, hemoglobin 13.9, ALT 11  Lipid Panel    Component  Value Date/Time   CHOL 159 07/26/2013 0718   TRIG 86 07/26/2013 0718   HDL 89 07/26/2013 0718   CHOLHDL 1.8 07/26/2013 0718   VLDL 17 07/26/2013 0718   LDLCALC 53 07/26/2013 0718  12/02/2018 Total cholesterol 176, HDL 80, LDL 81, triglycerides 79, hemoglobin A1c 5.4% 06/13/2019 Total cholesterol 181, HDL 76, LDL 81, triglycerides 80 06/14/2020 Cholesterol 187, HDL 77, LDL 89, triglycerides 117  ASSESSMENT:    1. Paroxysmal atrial fibrillation (HCC)   2. PAT (paroxysmal atrial tachycardia) ([place])   3. Acquired thrombophilia ([place])   4. History of syncope   5. Coronary artery disease involving native coronary artery of native heart without angina pectoris   6. Hypercholesterolemia   7. Essential hypertension   8. Status post placement of implantable loop recorder       PLAN:  In order of problems listed above:  AFib: Very low burden of arrhythmia.  More commonly she has episodes of sustained atrial tachycardia, usually brief.  The most recent one was lengthy and was triggered by bowel prep for colonoscopy.   CHADSVasc 5 (age 58, gender, HTN, CAD). Eliquis: Has only had 1 serious fall in the last 6 months.  No recent epistaxis.   Syncope: None has occurred recently.  Her fall was not associated with syncope.  Has with all her previous episodes of syncope and near syncope, the symptoms of her most recent event are strongly suggestive of vasovagal mechanism.  No meaningful arrhythmia has ever been detected at the time of her presyncopal syncopal events since we implanted a loop  recorder.   CAD: Does not have angina pectoris. Moderate CAD was discovered incidentally when she had coronary angiography for takotsubo syndrome.  HLP: Excellent HDL, acceptable LDL.  Due for repeat lipid profile. HTN: Her systolic blood pressure is quite high today.  We are not shooting for peripheral blood pressure control, but would like to keep it in a target range of 140-150/70-80 since she has a history of falls related to orthostatic hypotension.  She is going through a period of some emotional upheaval.  I asked her to check her blood pressure a few times at home when she is fully relaxed and send me those records via [place] before we make any changes in her medications. ILR: Recent episode of PAT triggered by colonoscopy prep no recent atrial fibrillation Intracranial atherosclerosis: No significant stenoses by recent CT angiogram. Stereotypical recurrent transient right sensory changes: Per Dr. Tobey Grim recent notes, "The patient has a Jacksonian type March with the sensory changes that suggest that the events represent either a sensory seizure or a migraine equivalent event.  There is no evidence of heightened cerebrovascular risk.  At this point, the patient is no longer having her episodes.  If the spells continue, I may consider empiric trial on anticonvulsant medication.  Otherwise, she will follow up here on an as-needed basis".   Medication Adjustments/Labs and Tests Ordered: Current medicines are reviewed at length with the patient today.  Concerns regarding medicines are outlined above.  Medication changes, Labs and Tests ordered today are listed in the Patient Instructions below. Patient Instructions  Medication Instructions:  START Amlodipine 2.5 mg once daily  *If you need a refill on your cardiac medications before your next appointment, please call your pharmacy*   Lab Work: None ordered If you have labs (blood work) drawn today and your tests are completely normal, you  will receive your results only by: [place] (if you have MyChart) OR  A paper copy in the mail If you have any lab test that is abnormal or we need to change your treatment, we will call you to review the results.   Testing/Procedures: None ordered   Follow-Up: At [facility], you and your health needs are our priority.  As part of our continuing mission to provide you with exceptional heart care, we have created designated Provider Care Teams.  These Care Teams include your primary Cardiologist (physician) and Advanced Practice Providers (APPs -  Physician Assistants and Nurse Practitioners) who all work together to provide you with the care you need, when you need it.  We recommend signing up for the patient portal called "MyChart".  Sign up information is provided on this After Visit Summary.  MyChart is used to connect with patients for Virtual Visits (Telemedicine).  Patients are able to view lab/test results, encounter notes, upcoming appointments, etc.  Non-urgent messages can be sent to your provider as well.   To learn more about what you can do with MyChart, go to [email or web address].    Your next appointment:   12 month(s)  The format for your next appointment:   In Person  Provider:   Sanda Klein, MD     Other Instructions Dr. Sallyanne Kuster would like you to check your blood pressure daily for the next week.  Keep a journal of these daily blood pressure and heart rate readings and call our office or send a message through [place] with the results. Thank you!  It is best to check your BP 1-2 hours after taking your medications to see the medications effectiveness on your BP.    Here are some tips that our clinical pharmacists share for home BP monitoring:          Rest 10 minutes before taking your blood pressure.          Don't smoke or drink caffeinated beverages for at least 30 minutes before.          Take your blood pressure before (not after) you  eat.          Sit comfortably with your back supported and both feet on the floor (don't cross your legs).          Elevate your arm to heart level on a table or a desk.          Use the proper sized cuff. It should fit smoothly and snugly around your bare upper arm. There should be enough room to slip a fingertip under the cuff. The bottom edge of the cuff should be 1 inch above the crease of the elbow.   Important Information About Sugar         Signed, Sanda Klein, MD  08/22/2021 1:58 PM    [facility] [place], [place], [place]  [address] Phone: [phone]; Fax: [phone]

## 2021-08-19 NOTE — Patient Instructions (Signed)
Medication Instructions:  START Amlodipine 2.5 mg once daily  *If you need a refill on your cardiac medications before your next appointment, please call your pharmacy*   Lab Work: None ordered If you have labs (blood work) drawn today and your tests are completely normal, you will receive your results only by: [place] (if you have MyChart) OR A paper copy in the mail If you have any lab test that is abnormal or we need to change your treatment, we will call you to review the results.   Testing/Procedures: None ordered   Follow-Up: At [facility], you and your health needs are our priority.  As part of our continuing mission to provide you with exceptional heart care, we have created designated Provider Care Teams.  These Care Teams include your primary Cardiologist (physician) and Advanced Practice Providers (APPs -  Physician Assistants and Nurse Practitioners) who all work together to provide you with the care you need, when you need it.  We recommend signing up for the patient portal called "MyChart".  Sign up information is provided on this After Visit Summary.  MyChart is used to connect with patients for Virtual Visits (Telemedicine).  Patients are able to view lab/test results, encounter notes, upcoming appointments, etc.  Non-urgent messages can be sent to your provider as well.   To learn more about what you can do with MyChart, go to [email or web address].    Your next appointment:   12 month(s)  The format for your next appointment:   In Person  Provider:   Sanda Klein, MD     Other Instructions Dr. Sallyanne Kuster would like you to check your blood pressure daily for the next week.  Keep a journal of these daily blood pressure and heart rate readings and call our office or send a message through [place] with the results. Thank you!  It is best to check your BP 1-2 hours after taking your medications to see the medications effectiveness on your BP.    Here  are some tips that our clinical pharmacists share for home BP monitoring:          Rest 10 minutes before taking your blood pressure.          Don't smoke or drink caffeinated beverages for at least 30 minutes before.          Take your blood pressure before (not after) you eat.          Sit comfortably with your back supported and both feet on the floor (don't cross your legs).          Elevate your arm to heart level on a table or a desk.          Use the proper sized cuff. It should fit smoothly and snugly around your bare upper arm. There should be enough room to slip a fingertip under the cuff. The bottom edge of the cuff should be 1 inch above the crease of the elbow.   Important Information About Sugar

## 2021-08-20 ENCOUNTER — Telehealth: Payer: Self-pay | Admitting: Cardiovascular Disease

## 2021-08-20 NOTE — Telephone Encounter (Signed)
*  STAT* If patient is at the pharmacy, call can be transferred to refill team.   1. Which medications need to be refilled? (please list name of each medication and dose if known)   nitroGLYCERIN (NITROSTAT) 0.4 MG SL tablet    2. Which pharmacy/location (including street and city if local pharmacy) is medication to be sent to?  Place 1 tablet (0.4 mg total) under the tongue every 5 (five) minutes as needed for chest pain.Patient not taking: Reported on 08/19/2021 3. Do they need a 30 day or 90 day supply?  30 day

## 2021-08-21 MED ORDER — NITROGLYCERIN 0.4 MG SL SUBL: 0.4000 mg | SUBLINGUAL_TABLET | SUBLINGUAL | 3 refills | Status: AC | PRN

## 2021-08-22 DIAGNOSIS — Z23 Encounter for immunization: Secondary | ICD-10-CM | POA: Diagnosis not present

## 2021-08-27 ENCOUNTER — Encounter: Payer: Self-pay | Admitting: Cardiovascular Disease

## 2021-08-28 MED ORDER — AMLODIPINE BESYLATE 5 MG PO TABS: 5.0000 mg | ORAL_TABLET | Freq: Every day | ORAL | 1 refills | Status: DC

## 2021-08-28 NOTE — Telephone Encounter (Signed)
Please increase the amlodipine to 5 mg daily

## 2021-09-02 NOTE — Progress Notes (Signed)
Carelink Summary Report / Loop Recorder 

## 2021-09-16 ENCOUNTER — Ambulatory Visit (INDEPENDENT_AMBULATORY_CARE_PROVIDER_SITE_OTHER): Payer: Medicare Other

## 2021-09-16 DIAGNOSIS — R55 Syncope and collapse: Secondary | ICD-10-CM

## 2021-09-18 LAB — CUP PACEART REMOTE DEVICE CHECK
Date Time Interrogation Session: 20230612230255
Implantable Pulse Generator Implant Date: 20210111

## 2021-10-07 DIAGNOSIS — H401131 Primary open-angle glaucoma, bilateral, mild stage: Secondary | ICD-10-CM | POA: Diagnosis not present

## 2021-10-08 NOTE — Progress Notes (Signed)
Carelink Summary Report / Loop Recorder 

## 2021-10-12 ENCOUNTER — Other Ambulatory Visit: Payer: Self-pay | Admitting: Cardiovascular Disease

## 2021-10-12 DIAGNOSIS — I480 Paroxysmal atrial fibrillation: Secondary | ICD-10-CM

## 2021-10-14 NOTE — Telephone Encounter (Signed)
Prescription refill request for Eliquis received. Indication: Afib  Last office visit: 08/19/21 ([place]) Scr: 0.60 (05/07/21) Age: 87 Weight: 72.2kg   Appropriate dose and refill sent to requested pharmacy.

## 2021-10-21 ENCOUNTER — Ambulatory Visit (INDEPENDENT_AMBULATORY_CARE_PROVIDER_SITE_OTHER): Payer: Medicare Other

## 2021-10-21 DIAGNOSIS — R55 Syncope and collapse: Secondary | ICD-10-CM | POA: Diagnosis not present

## 2021-10-22 LAB — CUP PACEART REMOTE DEVICE CHECK
Date Time Interrogation Session: 20230715230119
Implantable Pulse Generator Implant Date: 20210111

## 2021-11-11 ENCOUNTER — Encounter (HOSPITAL_COMMUNITY): Payer: Self-pay

## 2021-11-11 ENCOUNTER — Emergency Department (HOSPITAL_COMMUNITY): Payer: Medicare Other

## 2021-11-11 ENCOUNTER — Inpatient Hospital Stay (HOSPITAL_COMMUNITY)
Admission: EM | Admit: 2021-11-11 | Discharge: 2021-11-21 | DRG: 083 | Disposition: A | Discharge status: Skilled Nursing Facility | Payer: Medicare Other | Attending: Neurosurgery | Admitting: Neurosurgery

## 2021-11-11 ENCOUNTER — Other Ambulatory Visit: Payer: Self-pay

## 2021-11-11 DIAGNOSIS — K5790 Diverticulosis of intestine, part unspecified, without perforation or abscess without bleeding: Secondary | ICD-10-CM | POA: Diagnosis not present

## 2021-11-11 DIAGNOSIS — Z87891 Personal history of nicotine dependence: Secondary | ICD-10-CM | POA: Diagnosis not present

## 2021-11-11 DIAGNOSIS — K7689 Other specified diseases of liver: Secondary | ICD-10-CM | POA: Diagnosis not present

## 2021-11-11 DIAGNOSIS — M6281 Muscle weakness (generalized): Secondary | ICD-10-CM | POA: Diagnosis not present

## 2021-11-11 DIAGNOSIS — I471 Supraventricular tachycardia: Secondary | ICD-10-CM | POA: Diagnosis not present

## 2021-11-11 DIAGNOSIS — E871 Hypo-osmolality and hyponatremia: Secondary | ICD-10-CM | POA: Diagnosis present

## 2021-11-11 DIAGNOSIS — M4802 Spinal stenosis, cervical region: Secondary | ICD-10-CM | POA: Diagnosis not present

## 2021-11-11 DIAGNOSIS — Z803 Family history of malignant neoplasm of breast: Secondary | ICD-10-CM | POA: Diagnosis not present

## 2021-11-11 DIAGNOSIS — E878 Other disorders of electrolyte and fluid balance, not elsewhere classified: Secondary | ICD-10-CM | POA: Diagnosis present

## 2021-11-11 DIAGNOSIS — I10 Essential (primary) hypertension: Secondary | ICD-10-CM | POA: Diagnosis not present

## 2021-11-11 DIAGNOSIS — R451 Restlessness and agitation: Secondary | ICD-10-CM | POA: Diagnosis not present

## 2021-11-11 DIAGNOSIS — F1010 Alcohol abuse, uncomplicated: Secondary | ICD-10-CM | POA: Diagnosis present

## 2021-11-11 DIAGNOSIS — M7601 Gluteal tendinitis, right hip: Secondary | ICD-10-CM | POA: Diagnosis not present

## 2021-11-11 DIAGNOSIS — Z79899 Other long term (current) drug therapy: Secondary | ICD-10-CM | POA: Diagnosis not present

## 2021-11-11 DIAGNOSIS — G919 Hydrocephalus, unspecified: Secondary | ICD-10-CM | POA: Diagnosis not present

## 2021-11-11 DIAGNOSIS — G9389 Other specified disorders of brain: Secondary | ICD-10-CM | POA: Diagnosis not present

## 2021-11-11 DIAGNOSIS — Z885 Allergy status to narcotic agent status: Secondary | ICD-10-CM

## 2021-11-11 DIAGNOSIS — K219 Gastro-esophageal reflux disease without esophagitis: Secondary | ICD-10-CM | POA: Diagnosis present

## 2021-11-11 DIAGNOSIS — R112 Nausea with vomiting, unspecified: Secondary | ICD-10-CM | POA: Diagnosis not present

## 2021-11-11 DIAGNOSIS — W010XXA Fall on same level from slipping, tripping and stumbling without subsequent striking against object, initial encounter: Secondary | ICD-10-CM | POA: Diagnosis present

## 2021-11-11 DIAGNOSIS — R918 Other nonspecific abnormal finding of lung field: Secondary | ICD-10-CM | POA: Diagnosis not present

## 2021-11-11 DIAGNOSIS — Z20822 Contact with and (suspected) exposure to covid-19: Secondary | ICD-10-CM | POA: Diagnosis present

## 2021-11-11 DIAGNOSIS — M545 Low back pain, unspecified: Secondary | ICD-10-CM | POA: Diagnosis not present

## 2021-11-11 DIAGNOSIS — E7800 Pure hypercholesterolemia, unspecified: Secondary | ICD-10-CM | POA: Diagnosis not present

## 2021-11-11 DIAGNOSIS — R2689 Other abnormalities of gait and mobility: Secondary | ICD-10-CM | POA: Diagnosis not present

## 2021-11-11 DIAGNOSIS — S0635AA Traumatic hemorrhage of left cerebrum with loss of consciousness status unknown, initial encounter: Secondary | ICD-10-CM | POA: Diagnosis present

## 2021-11-11 DIAGNOSIS — Z7901 Long term (current) use of anticoagulants: Secondary | ICD-10-CM | POA: Diagnosis not present

## 2021-11-11 DIAGNOSIS — E785 Hyperlipidemia, unspecified: Secondary | ICD-10-CM | POA: Diagnosis present

## 2021-11-11 DIAGNOSIS — I350 Nonrheumatic aortic (valve) stenosis: Secondary | ICD-10-CM | POA: Diagnosis not present

## 2021-11-11 DIAGNOSIS — W19XXXA Unspecified fall, initial encounter: Secondary | ICD-10-CM | POA: Diagnosis not present

## 2021-11-11 DIAGNOSIS — Z90710 Acquired absence of both cervix and uterus: Secondary | ICD-10-CM

## 2021-11-11 DIAGNOSIS — R200 Anesthesia of skin: Secondary | ICD-10-CM | POA: Diagnosis not present

## 2021-11-11 DIAGNOSIS — R42 Dizziness and giddiness: Secondary | ICD-10-CM | POA: Diagnosis not present

## 2021-11-11 DIAGNOSIS — G911 Obstructive hydrocephalus: Secondary | ICD-10-CM | POA: Diagnosis not present

## 2021-11-11 DIAGNOSIS — R4182 Altered mental status, unspecified: Secondary | ICD-10-CM | POA: Diagnosis not present

## 2021-11-11 DIAGNOSIS — R197 Diarrhea, unspecified: Secondary | ICD-10-CM

## 2021-11-11 DIAGNOSIS — R102 Pelvic and perineal pain: Secondary | ICD-10-CM | POA: Diagnosis not present

## 2021-11-11 DIAGNOSIS — Z888 Allergy status to other drugs, medicaments and biological substances status: Secondary | ICD-10-CM

## 2021-11-11 DIAGNOSIS — I5181 Takotsubo syndrome: Secondary | ICD-10-CM | POA: Diagnosis present

## 2021-11-11 DIAGNOSIS — R262 Difficulty in walking, not elsewhere classified: Secondary | ICD-10-CM | POA: Diagnosis not present

## 2021-11-11 DIAGNOSIS — I615 Nontraumatic intracerebral hemorrhage, intraventricular: Principal | ICD-10-CM

## 2021-11-11 DIAGNOSIS — F339 Major depressive disorder, recurrent, unspecified: Secondary | ICD-10-CM | POA: Diagnosis not present

## 2021-11-11 DIAGNOSIS — E876 Hypokalemia: Secondary | ICD-10-CM | POA: Diagnosis not present

## 2021-11-11 DIAGNOSIS — H353212 Exudative age-related macular degeneration, right eye, with inactive choroidal neovascularization: Secondary | ICD-10-CM | POA: Diagnosis not present

## 2021-11-11 DIAGNOSIS — S199XXA Unspecified injury of neck, initial encounter: Secondary | ICD-10-CM | POA: Diagnosis not present

## 2021-11-11 DIAGNOSIS — I959 Hypotension, unspecified: Secondary | ICD-10-CM | POA: Diagnosis not present

## 2021-11-11 DIAGNOSIS — I480 Paroxysmal atrial fibrillation: Secondary | ICD-10-CM | POA: Diagnosis not present

## 2021-11-11 DIAGNOSIS — Z886 Allergy status to analgesic agent status: Secondary | ICD-10-CM | POA: Diagnosis not present

## 2021-11-11 DIAGNOSIS — J301 Allergic rhinitis due to pollen: Secondary | ICD-10-CM | POA: Diagnosis not present

## 2021-11-11 DIAGNOSIS — T1490XA Injury, unspecified, initial encounter: Secondary | ICD-10-CM | POA: Diagnosis not present

## 2021-11-11 DIAGNOSIS — S06360A Traumatic hemorrhage of cerebrum, unspecified, without loss of consciousness, initial encounter: Secondary | ICD-10-CM | POA: Diagnosis not present

## 2021-11-11 DIAGNOSIS — Z8249 Family history of ischemic heart disease and other diseases of the circulatory system: Secondary | ICD-10-CM

## 2021-11-11 DIAGNOSIS — R2681 Unsteadiness on feet: Secondary | ICD-10-CM | POA: Diagnosis not present

## 2021-11-11 DIAGNOSIS — M47812 Spondylosis without myelopathy or radiculopathy, cervical region: Secondary | ICD-10-CM | POA: Diagnosis not present

## 2021-11-11 DIAGNOSIS — K449 Diaphragmatic hernia without obstruction or gangrene: Secondary | ICD-10-CM | POA: Diagnosis not present

## 2021-11-11 DIAGNOSIS — I2510 Atherosclerotic heart disease of native coronary artery without angina pectoris: Secondary | ICD-10-CM | POA: Diagnosis present

## 2021-11-11 DIAGNOSIS — I252 Old myocardial infarction: Secondary | ICD-10-CM

## 2021-11-11 DIAGNOSIS — Z8669 Personal history of other diseases of the nervous system and sense organs: Secondary | ICD-10-CM | POA: Diagnosis not present

## 2021-11-11 DIAGNOSIS — M25561 Pain in right knee: Secondary | ICD-10-CM | POA: Diagnosis not present

## 2021-11-11 DIAGNOSIS — Z7401 Bed confinement status: Secondary | ICD-10-CM | POA: Diagnosis not present

## 2021-11-11 DIAGNOSIS — S3991XA Unspecified injury of abdomen, initial encounter: Secondary | ICD-10-CM | POA: Diagnosis not present

## 2021-11-11 DIAGNOSIS — Z9181 History of falling: Secondary | ICD-10-CM

## 2021-11-11 DIAGNOSIS — M2578 Osteophyte, vertebrae: Secondary | ICD-10-CM | POA: Diagnosis not present

## 2021-11-11 DIAGNOSIS — I6523 Occlusion and stenosis of bilateral carotid arteries: Secondary | ICD-10-CM | POA: Diagnosis not present

## 2021-11-11 DIAGNOSIS — R296 Repeated falls: Secondary | ICD-10-CM | POA: Diagnosis present

## 2021-11-11 DIAGNOSIS — I700 Atherosclerosis of aorta: Secondary | ICD-10-CM | POA: Diagnosis not present

## 2021-11-11 DIAGNOSIS — Z781 Physical restraint status: Secondary | ICD-10-CM

## 2021-11-11 DIAGNOSIS — R41841 Cognitive communication deficit: Secondary | ICD-10-CM | POA: Diagnosis not present

## 2021-11-11 DIAGNOSIS — F39 Unspecified mood [affective] disorder: Secondary | ICD-10-CM | POA: Diagnosis not present

## 2021-11-11 DIAGNOSIS — M1711 Unilateral primary osteoarthritis, right knee: Secondary | ICD-10-CM | POA: Diagnosis not present

## 2021-11-11 DIAGNOSIS — G936 Cerebral edema: Secondary | ICD-10-CM | POA: Diagnosis not present

## 2021-11-11 DIAGNOSIS — I672 Cerebral atherosclerosis: Secondary | ICD-10-CM | POA: Diagnosis not present

## 2021-11-11 DIAGNOSIS — R079 Chest pain, unspecified: Secondary | ICD-10-CM | POA: Diagnosis not present

## 2021-11-11 DIAGNOSIS — S32030D Wedge compression fracture of third lumbar vertebra, subsequent encounter for fracture with routine healing: Secondary | ICD-10-CM | POA: Diagnosis not present

## 2021-11-11 DIAGNOSIS — S299XXA Unspecified injury of thorax, initial encounter: Secondary | ICD-10-CM | POA: Diagnosis not present

## 2021-11-11 DIAGNOSIS — Z923 Personal history of irradiation: Secondary | ICD-10-CM

## 2021-11-11 LAB — CBC
HCT: 42.1 % (ref 36.0–46.0)
Hemoglobin: 14.6 g/dL (ref 12.0–15.0)
MCH: 33.7 pg (ref 26.0–34.0)
MCHC: 34.7 g/dL (ref 30.0–36.0)
MCV: 97.2 fL (ref 80.0–100.0)
Platelets: 261 10*3/uL (ref 150–400)
RBC: 4.33 MIL/uL (ref 3.87–5.11)
RDW: 11.7 % (ref 11.5–15.5)
WBC: 7.3 10*3/uL (ref 4.0–10.5)
nRBC: 0 % (ref 0.0–0.2)

## 2021-11-11 LAB — COMPREHENSIVE METABOLIC PANEL
ALT: 18 U/L (ref 0–44)
AST: 24 U/L (ref 15–41)
Albumin: 3.9 g/dL (ref 3.5–5.0)
Alkaline Phosphatase: 53 U/L (ref 38–126)
Anion gap: 14 (ref 5–15)
BUN: 8 mg/dL (ref 8–23)
CO2: 26 mmol/L (ref 22–32)
Calcium: 8.8 mg/dL — ABNORMAL LOW (ref 8.9–10.3)
Chloride: 86 mmol/L — ABNORMAL LOW (ref 98–111)
Creatinine, Ser: 0.65 mg/dL (ref 0.44–1.00)
GFR, Estimated: >60 mL/min (ref >60)
Glucose, Bld: 165 mg/dL — ABNORMAL HIGH (ref 70–99)
Potassium: 3 mmol/L — ABNORMAL LOW (ref 3.5–5.1)
Sodium: 126 mmol/L — ABNORMAL LOW (ref 135–145)
Total Bilirubin: 0.9 mg/dL (ref 0.3–1.2)
Total Protein: 7.3 g/dL (ref 6.5–8.1)

## 2021-11-11 LAB — I-STAT CHEM 8, ED
BUN: 9 mg/dL (ref 8–23)
Calcium, Ion: 1.02 mmol/L — ABNORMAL LOW (ref 1.15–1.40)
Chloride: 87 mmol/L — ABNORMAL LOW (ref 98–111)
Creatinine, Ser: 0.5 mg/dL (ref 0.44–1.00)
Glucose, Bld: 163 mg/dL — ABNORMAL HIGH (ref 70–99)
HCT: 46 % (ref 36.0–46.0)
Hemoglobin: 15.6 g/dL — ABNORMAL HIGH (ref 12.0–15.0)
Potassium: 3.1 mmol/L — ABNORMAL LOW (ref 3.5–5.1)
Sodium: 125 mmol/L — ABNORMAL LOW (ref 135–145)
TCO2: 26 mmol/L (ref 22–32)

## 2021-11-11 LAB — PROTIME-INR
INR: 1.2 (ref 0.8–1.2)
Prothrombin Time: 14.8 seconds (ref 11.4–15.2)

## 2021-11-11 LAB — RESP PANEL BY RT-PCR (FLU A&B, COVID) ARPGX2
Influenza A by PCR: NEGATIVE
Influenza B by PCR: NEGATIVE
SARS Coronavirus 2 by RT PCR: NEGATIVE

## 2021-11-11 MED ORDER — HYDROMORPHONE HCL 1 MG/ML IJ SOLN
0.5000 mg | INTRAMUSCULAR | Status: DC | PRN
  Administered 2021-11-12 – 2021-11-19 (×6): 1 mg via INTRAVENOUS
  Filled 2021-11-11 (×6): qty 1

## 2021-11-11 MED ORDER — EMPTY CONTAINERS FLEXIBLE MISC
900.0000 mg | Freq: Once | Status: AC
  Administered 2021-11-11: 900 mg via INTRAVENOUS
  Filled 2021-11-11: qty 90

## 2021-11-11 MED ORDER — ONDANSETRON HCL 4 MG PO TABS: 4.0000 mg | ORAL_TABLET | Freq: Four times a day (QID) | ORAL | Status: DC | PRN

## 2021-11-11 MED ORDER — ACETAMINOPHEN 650 MG RE SUPP: 650.0000 mg | Freq: Four times a day (QID) | RECTAL | Status: DC | PRN

## 2021-11-11 MED ORDER — ONDANSETRON HCL 4 MG/2ML IJ SOLN
4.0000 mg | Freq: Four times a day (QID) | INTRAMUSCULAR | Status: DC | PRN
  Administered 2021-11-12 – 2021-11-13 (×2): 4 mg via INTRAVENOUS
  Filled 2021-11-11 (×2): qty 2

## 2021-11-11 MED ORDER — ACETAMINOPHEN 325 MG PO TABS
650.0000 mg | ORAL_TABLET | Freq: Four times a day (QID) | ORAL | Status: DC | PRN
  Administered 2021-11-13 – 2021-11-21 (×11): 650 mg via ORAL
  Filled 2021-11-11 (×11): qty 2

## 2021-11-11 MED ORDER — SODIUM CHLORIDE 0.9 % IV SOLN: INTRAVENOUS | Status: DC

## 2021-11-11 MED ORDER — ONDANSETRON HCL 4 MG/2ML IJ SOLN
4.0000 mg | Freq: Once | INTRAMUSCULAR | Status: AC
Start: 2021-11-11 — End: 2021-11-11
  Administered 2021-11-11: 4 mg via INTRAVENOUS
  Filled 2021-11-11: qty 2

## 2021-11-11 NOTE — ED Triage Notes (Signed)
Pt BIB [facility] for level 2 fall on thinners (eliquis). Pt was walking in kitchen when she fell backwards onto tile floor, hit back of head and c/o L hip pain. Denies LOC, pt experiencing N/V. VSS AO

## 2021-11-11 NOTE — ED Notes (Signed)
Patient transported to CT 

## 2021-11-11 NOTE — H&P (Signed)
Melinda [name] is an 87 y.o. female.   Chief Complaint: Status post fall HPI: 87 year old female with a mechanical ground-level fall today.  Patient complains of mild headache with some dizziness.  She has had some intermittent nausea and vomiting.  She denies any numbness paresthesias or weakness.  She also notes some mild left hip discomfort.  She has no new visual complaints.  She has no seizure.  She has no history of loss of consciousness.  Her situation is complicated by anticoagulant use for treatment of chronic atrial fibrillation.  Past Medical History:  Diagnosis Date   Allergy    Anemia 1984   before hysterectomy   Breast cancer ([place])    bilateral   GERD (gastroesophageal reflux disease)    History of blood product transfusion 1984   Hyperlipidemia    Hypertension    Macular degeneration    right eye   Myocardial infarction ([place])    Osteopenia    Personal history of radiation therapy    Takotsubo cardiomyopathy     Past Surgical History:  Procedure Laterality Date   ABDOMINAL HYSTERECTOMY  1984   APPENDECTOMY     BREAST BIOPSY Right 04/29/05   right    BREAST BIOPSY Left 2006   BREAST LUMPECTOMY Left 04/19/04   left with sentinel node   BREAST LUMPECTOMY Right 2007   CATARACT EXTRACTION     bilateral   EYE SURGERY     LEFT HEART CATHETERIZATION WITH CORONARY ANGIOGRAM N/A 07/26/2013   Procedure: LEFT HEART CATHETERIZATION WITH CORONARY ANGIOGRAM;  Surgeon: Jettie Booze, MD;  Location: [facility] CATH LAB;  Service: Cardiovascular;  Laterality: N/A;   RETINAL LASER PROCEDURE     right    Family History  Problem Relation Age of Onset   Dementia Mother    Stroke Mother    Heart disease Mother 54   Cancer Father        Prostate   Heart disease Father 67   Cancer Maternal Aunt        Breast   Breast cancer Maternal Aunt    Cancer Maternal Grandmother    Breast cancer Maternal Grandmother    Heart attack Maternal Grandfather    Heart attack Paternal  Grandfather    Social History:  reports that she quit smoking about 33 years ago. Her smoking use included cigarettes. She has never used smokeless tobacco. She reports current alcohol use. She reports that she does not use drugs.  Allergies:  Allergies  Allergen Reactions   Nsaids Hives, Shortness Of Breath and Other (See Comments)    Extreme hives and difficulty breathing. "Has had to go to the ER."   Salicylates Other (See Comments)    (aspirin) Extreme hives and difficulty breathing. "Has had to go to the ER."   Hctz [Hydrochlorothiazide] Other (See Comments)    unknown   Codeine Nausea Only   Etodolac Rash   Hydrochlorothiazide Other (See Comments)    Hyponatremia    (Not in a hospital admission)   Results for orders placed or performed during the hospital encounter of 11/11/21 (from the past 48 hour(s))  Resp Panel by RT-PCR (Flu A&B, Covid) Anterior Nasal Swab     Status: None   Collection Time: 11/11/21  8:14 PM   Specimen: Anterior Nasal Swab  Result Value Ref Range   SARS Coronavirus 2 by RT PCR NEGATIVE NEGATIVE    Comment: (NOTE) SARS-CoV-2 target nucleic acids are NOT DETECTED.  The SARS-CoV-2 RNA is generally  detectable in upper respiratory specimens during the acute phase of infection. The lowest concentration of SARS-CoV-2 viral copies this assay can detect is 138 copies/mL. A negative result does not preclude SARS-Cov-2 infection and should not be used as the sole basis for treatment or other patient management decisions. A negative result may occur with  improper specimen collection/handling, submission of specimen other than nasopharyngeal swab, presence of viral mutation(s) within the areas targeted by this assay, and inadequate number of viral copies(<138 copies/mL). A negative result must be combined with clinical observations, patient history, and epidemiological information. The expected result is Negative.  Fact Sheet for Patients:   EntrepreneurPulse.com.au  Fact Sheet for Healthcare Providers:  IncredibleEmployment.be  This test is no t yet approved or cleared by the [place] FDA and  has been authorized for detection and/or diagnosis of SARS-CoV-2 by FDA under an Emergency Use Authorization (EUA). This EUA will remain  in effect (meaning this test can be used) for the duration of the COVID-19 declaration under Section 564(b)(1) of the Act, 21 U.S.C.section 360bbb-3(b)(1), unless the authorization is terminated  or revoked sooner.       Influenza A by PCR NEGATIVE NEGATIVE   Influenza B by PCR NEGATIVE NEGATIVE    Comment: (NOTE) The Xpert Xpress SARS-CoV-2/FLU/RSV plus assay is intended as an aid in the diagnosis of influenza from Nasopharyngeal swab specimens and should not be used as a sole basis for treatment. Nasal washings and aspirates are unacceptable for Xpert Xpress SARS-CoV-2/FLU/RSV testing.  Fact Sheet for Patients: EntrepreneurPulse.com.au  Fact Sheet for Healthcare Providers: IncredibleEmployment.be  This test is not yet approved or cleared by the [place] FDA and has been authorized for detection and/or diagnosis of SARS-CoV-2 by FDA under an Emergency Use Authorization (EUA). This EUA will remain in effect (meaning this test can be used) for the duration of the COVID-19 declaration under Section 564(b)(1) of the Act, 21 U.S.C. section 360bbb-3(b)(1), unless the authorization is terminated or revoked.  Performed at [facility], [place] [address], [place], [place] [address]   Comprehensive metabolic panel     Status: Abnormal   Collection Time: 11/11/21  8:20 PM  Result Value Ref Range   Sodium 126 (L) 135 - 145 mmol/L   Potassium 3.0 (L) 3.5 - 5.1 mmol/L   Chloride 86 (L) 98 - 111 mmol/L   CO2 26 22 - 32 mmol/L   Glucose, Bld 165 (H) 70 - 99 mg/dL    Comment: Glucose reference range applies only  to samples taken after fasting for at least 8 hours.   BUN 8 8 - 23 mg/dL   Creatinine, Ser 0.65 0.44 - 1.00 mg/dL   Calcium 8.8 (L) 8.9 - 10.3 mg/dL   Total Protein 7.3 6.5 - 8.1 g/dL   Albumin 3.9 3.5 - 5.0 g/dL   AST 24 15 - 41 U/L   ALT 18 0 - 44 U/L   Alkaline Phosphatase 53 38 - 126 U/L   Total Bilirubin 0.9 0.3 - 1.2 mg/dL   GFR, Estimated >60 >60 mL/min    Comment: (NOTE) Calculated using the CKD-EPI Creatinine Equation (2021)    Anion gap 14 5 - 15    Comment: Performed at [facility] [address] [place] [address]  CBC     Status: None   Collection Time: 11/11/21  8:20 PM  Result Value Ref Range   WBC 7.3 4.0 - 10.5 K/uL   RBC 4.33 3.87 - 5.11  MIL/uL   Hemoglobin 14.6 12.0 - 15.0 g/dL   HCT 42.1 36.0 - 46.0 %   MCV 97.2 80.0 - 100.0 fL   MCH 33.7 26.0 - 34.0 pg   MCHC 34.7 30.0 - 36.0 g/dL   RDW 11.7 11.5 - 15.5 %   Platelets 261 150 - 400 K/uL   nRBC 0.0 0.0 - 0.2 %    Comment: Performed at [facility], [place] [address], [place], [place] [address]  Protime-INR     Status: None   Collection Time: 11/11/21  8:20 PM  Result Value Ref Range   Prothrombin Time 14.8 11.4 - 15.2 seconds   INR 1.2 0.8 - 1.2    Comment: (NOTE) INR goal varies based on device and disease states. Performed at [facility], [place] [address], [place],  [address]   I-Stat Chem 8, ED     Status: Abnormal   Collection Time: 11/11/21  8:25 PM  Result Value Ref Range   Sodium 125 (L) 135 - 145 mmol/L   Potassium 3.1 (L) 3.5 - 5.1 mmol/L   Chloride 87 (L) 98 - 111 mmol/L   BUN 9 8 - 23 mg/dL   Creatinine, Ser 0.50 0.44 - 1.00 mg/dL   Glucose, Bld 163 (H) 70 - 99 mg/dL    Comment: Glucose reference range applies only to samples taken after fasting for at least 8 hours.   Calcium, Ion 1.02 (L) 1.15 - 1.40 mmol/L   TCO2 26 22 - 32 mmol/L   Hemoglobin 15.6 (H) 12.0 - 15.0 g/dL   HCT 46.0 36.0 - 46.0 %   CT HEAD WO CONTRAST  Result Date:  11/11/2021 CLINICAL DATA:  Fall on blood thinners EXAM: CT HEAD WITHOUT CONTRAST TECHNIQUE: Contiguous axial images were obtained from the base of the skull through the vertex without intravenous contrast. RADIATION DOSE REDUCTION: This exam was performed according to the departmental dose-optimization program which includes automated exposure control, adjustment of the mA and/or kV according to patient size and/or use of iterative reconstruction technique. COMPARISON:  05/07/2021 FINDINGS: Brain: Focal area of parenchymal hemorrhage in the left temporal lobe measures 11 x 8 x 3 mm (series 6, image 44, series 5, image 42, and series 3, image 16). Minimal surrounding edema without significant mass effect or midline shift. Larger amount of intraventricular hemorrhage, most prominently in the left lateral ventricle, from the frontal to the occipital to the temporal horn, but also a smaller amount in the right ventricle third ventricle, cerebral aqueduct, and fourth ventricle, extending inferiorly into the spinal canal (series 3, image 6). Possible minimal enlargement of the lateral ventricles without frank hydrocephalus. No subdural collection. Possible small amount of subarachnoid hemorrhage in the left temporal lobe (series 5, image 40). No acute infarct or mass. Vascular: No hyperdense vessel. Skull: Normal. Negative for fracture or focal lesion. Sinuses/Orbits: No acute finding. Other: The mastoid air cells are well aerated. IMPRESSION: 1. Small area of parenchymal hemorrhage in the left temporal lobe without significant mass effect or midline shift. 2. Intraventricular hemorrhage, most prominently in the left lateral ventricle, but also in the right lateral ventricle, third ventricle, and fourth ventricle. Possible minimal enlargement of the lateral ventricles without frank hydrocephalus. 3. Possible small additional amount of subarachnoid hemorrhage in the left temporal lobe. These results were called by  telephone at the time of interpretation on 11/11/2021 at 9:24 pm to provider [facility], who verbally acknowledged these results. Electronically Signed   By: Bryson Ha  Vasan M.D.   On: 11/11/2021 21:24   CT CERVICAL SPINE WO CONTRAST  Result Date: 11/11/2021 CLINICAL DATA:  Trauma.  Fall. EXAM: CT CERVICAL SPINE WITHOUT CONTRAST TECHNIQUE: Multidetector CT imaging of the cervical spine was performed without intravenous contrast. Multiplanar CT image reconstructions were also generated. RADIATION DOSE REDUCTION: This exam was performed according to the departmental dose-optimization program which includes automated exposure control, adjustment of the mA and/or kV according to patient size and/or use of iterative reconstruction technique. COMPARISON:  Cervical spine CT 05/07/2021 FINDINGS: Alignment: Normal. Skull base and vertebrae: No acute fracture. No primary bone lesion or focal pathologic process. Soft tissues and spinal canal: No prevertebral fluid or swelling. No visible canal hematoma. Disc levels: There is disc space narrowing and endplate osteophyte formation most significant at C4-C5, C5-C6 and C6-C7. There are degenerative changes of facet joints bilaterally. There is moderate neural foraminal stenosis on the left at C3-C4, C4-C5 and C5-C6 secondary to facet arthropathy and uncovertebral spurring. There is no significant central canal stenosis at any level. Upper chest: Negative. Other: Intraventricular hemorrhage fourth ventricle. IMPRESSION: 1. No acute fracture or dislocation of the cervical spine. 2. Mild degenerative changes. 3. Intraventricular hemorrhage in the fourth ventricle. Please see CT of the head. Electronically Signed   By: Ronney Asters M.D.   On: 11/11/2021 21:08   DG Pelvis Portable  Result Date: 11/11/2021 CLINICAL DATA:  Recent fall with pelvic pain, initial encounter EXAM: PORTABLE PELVIS 1 VIEWS COMPARISON:  None Available. FINDINGS: Pelvic ring is intact. No acute fracture or  dislocation is noted. Mild degenerative changes of lumbar spine are seen. IMPRESSION: No acute abnormality noted. Electronically Signed   By: Inez Catalina M.D.   On: 11/11/2021 20:23   DG Chest Port 1 View  Result Date: 11/11/2021 CLINICAL DATA:  Recent fall with chest pain, initial encounter EXAM: PORTABLE CHEST 1 VIEW COMPARISON:  01/17/2020 FINDINGS: Cardiac shadow is stable. Loop recorder is again seen. Tortuous thoracic aorta is noted. The lungs are well aerated without focal infiltrate or effusion. No bony abnormality is seen. IMPRESSION: No acute abnormality noted. Electronically Signed   By: Inez Catalina M.D.   On: 11/11/2021 20:23    Pertinent items noted in HPI and remainder of comprehensive ROS otherwise negative.  Blood pressure (!) 157/51, pulse 76, temperature 97.9 F (36.6 C), temperature source Oral, resp. rate (!) 23, height '5\' 3"'$  (1.6 m), weight 68 kg, SpO2 98 %.  Patient is awake and alert.  She is oriented and appropriate.  Her speech is fluent.  Her judgment and insight appear intact.  Cranial nerve function reveals normal visual acuity bilaterally.  Pupils are equal and reactive to light bilaterally.  Gaze is conjugate.  Extraocular movements are full.  Facial movement and sensation normal bilateral.  Tongue protrudes the midline.  Motor examination 5/5 bilaterally.  No evidence of drift.  Sensory examination nonfocal.  Examination head ears eyes nose and throat demonstrates no evidence of significant trauma.  No evidence of laceration.  Oropharynx, nasopharynx and external auditory canals clear.  Neck supple and nontender.  Pulses normal.  Chest and abdomen benign.  Extremities free from injury or deformity. Assessment/Plan Status post fall with intraventricular hemorrhage.  No current evidence of hydrocephalus.  Patient chronically anticoagulated on Eliquis.  Reversal agent has been initiated.  Plan intensive care admission for observation.  Follow-up head CT scan in morning.   Watch for signs of further hemorrhage or obstructive hydrocephalus.  Mallie Mussel A Barret Esquivel 11/11/2021, 10:40  PM

## 2021-11-11 NOTE — ED Provider Notes (Signed)
[facility] EMERGENCY DEPARTMENT Provider Note   CSN: [id number] Arrival date & time: 11/11/21  1957     History  Chief Complaint  Patient presents with   Melinda [name] is a 87 y.o. female PMH alcohol abuse, hypertension, NSTEMI, A-fib who is presenting with fall.  Patient brought in by EMS, contributed history.  They report that for the last 2 to 3 days, patient has been experiencing nausea and vomiting.  Today, as the patient was walking to the kitchen, she felt dizzy, and fell backwards.  She hit her head and her left hip.  Patient denies LOC.  Patient's husband witnessed the fall, and says that she was confused afterwards.  Patient reports feeling nauseous but denies any current pain.  Patient denies any recent chest pain, difficulty breathing, abdominal pain, dysuria, hematuria, but notes some diarrhea over the last few days.    Home Medications Prior to Admission medications   Medication Sig Start Date End Date Taking? Authorizing Provider  acetaminophen (TYLENOL) 500 MG tablet Take 1,000 mg by mouth at bedtime as needed (for pain, headaches, or fever).     [provider]  albuterol (VENTOLIN HFA) 108 (90 Base) MCG/ACT inhaler Inhale 1-2 puffs into the lungs every 6 (six) hours as needed for wheezing or shortness of breath. Patient not taking: Reported on 08/19/2021    [provider]  amLODipine (NORVASC) 5 MG tablet Take 1 tablet (5 mg total) by mouth daily. 08/28/21 02/24/22  Croitoru, Mihai, MD  atorvastatin (LIPITOR) 20 MG tablet Take 20 mg by mouth at bedtime.    [provider]  cholecalciferol (VITAMIN D) 1000 units tablet Take 1,000 Units by mouth daily.    [provider]  ELIQUIS 5 MG TABS tablet TAKE ONE TABLET BY MOUTH TWICE A DAY 10/14/21   Croitoru, Mihai, MD  fexofenadine (ALLEGRA) 180 MG tablet Take 180 mg by mouth daily.    [provider]  latanoprost (XALATAN) 0.005 % ophthalmic solution  Place 1 drop into both eyes at bedtime.  05/18/19   [provider]  losartan (COZAAR) 50 MG tablet TAKE TWO TABLETS BY MOUTH DAILY 08/05/21   Croitoru, Mihai, MD  metoprolol succinate (TOPROL-XL) 50 MG 24 hr tablet Take 50 mg by mouth daily. 05/05/21   [provider]  Multiple Vitamins-Minerals (PRESERVISION AREDS 2+MULTI VIT) CAPS Take 1 tablet by mouth daily.    [provider]  nitroGLYCERIN (NITROSTAT) 0.4 MG SL tablet Place 1 tablet (0.4 mg total) under the tongue every 5 (five) minutes as needed for chest pain. 08/21/21   Croitoru, Mihai, MD  Nutritional Supplements (COLON FORMULA) 166.67-500 MG CAPS Take 1 capsule by mouth daily.    [provider]  sertraline (ZOLOFT) 25 MG tablet Take 25 mg by mouth daily. 09/05/19   [provider]  timolol (TIMOPTIC) 0.5 % ophthalmic solution Place 1 drop into the right eye every morning.  01/31/19   [provider]      Allergies    Nsaids, Salicylates, Hctz [hydrochlorothiazide], Codeine, Etodolac, and Hydrochlorothiazide    Review of Systems   Review of Systems As in HPI.  Physical Exam Updated Vital Signs SpO2 96%   Physical Exam Vitals and nursing note reviewed.  Constitutional:      General: She is not in acute distress.    Appearance: Normal appearance. She is well-developed. She is not ill-appearing or diaphoretic.  HENT:     Head: Normocephalic and atraumatic.  Right Ear: External ear normal.     Left Ear: External ear normal.     Nose: Nose normal.     Mouth/Throat:     Mouth: Mucous membranes are moist.  Eyes:     Pupils: Pupils are equal, round, and reactive to light.  Cardiovascular:     Rate and Rhythm: Normal rate and regular rhythm.     Heart sounds: No murmur heard. Pulmonary:     Effort: Pulmonary effort is normal. No respiratory distress.     Breath sounds: Normal breath sounds.  Abdominal:     General: There is no distension.     Palpations: Abdomen is soft.      Tenderness: There is no abdominal tenderness. There is no guarding.  Musculoskeletal:        General: Normal range of motion.     Cervical back: Neck supple. No tenderness.     Right lower leg: No edema.     Left lower leg: No edema.     Comments: Full range of motion of all extremities.  Skin:    General: Skin is warm and dry.  Neurological:     Mental Status: She is alert.     ED Results / Procedures / Treatments   Labs (all labs ordered are listed, but only abnormal results are displayed) Labs Reviewed - No data to display  EKG None  Radiology No results found.  Procedures Procedures   Medications Ordered in ED Medications - No data to display  ED Course/ Medical Decision Making/ A&P Clinical Course as of 11/11/21 2313  Mon Nov 11, 2021  2123 Rads w/ IPH L temporal lobe and ventricles wo frank hydrocephaus, ?SAH. Recommends CTA  [BR]    Clinical Course User Index [BR] Faylene Million, MD                           Medical Decision Making Amount and/or Complexity of Data Reviewed Labs: ordered. Radiology: ordered.  Risk Prescription drug management. Decision regarding hospitalization.    Medical Decision Making:  Melinda [name] is a 87 y.o. female PMH alcohol abuse, hypertension, NSTEMI, A-fib, who presented to the ED today with a level 2 trauma after GLF on thinners.     Trauma Surgery team not at bedside upon patient arrival. Reviewed and confirmed nursing documentation for past medical history, family history, social history.   Initial Assessment:  Primary survey: Airway intact. BL breath sounds present.  Circulation established with WNL BP, IVs, and radial/femoral pulses.  Disability evaluation negative. No obvious disability requiring intervention.  Patient fully exposed and all injuries were noted, any penetrating injuries were labeled with radiopaque markers. No emergent interventions took place in the primary survey.  Patient stable for  CXR that demonstrated no traumatic hemopneumothorax and PXR that demonstrated no unstable pelvic fractures. EFAST deferred.  Secondary survey: Patient fully exposed and secondary survey was performed.  See concurrent documentation for further information. In summary: patient had no obvious injuries. Patient stable for transfer to CT scanner for further traumatic evaluation.   Trauma scans obtained, resulted notable for CT head with multicompartmental hemorrhage.  Radiology recommends additional imaging including CTA.  Given traumatic injuries, will obtain additional trauma scans with CT chest/abdomen/pelvis and CT T and L-spine.    Patient with a head bleed on Eliquis, and was reversed with Andexxa.  Lab work obtained.  INR 1.2.  CBC within normal limits.  CMP notable for hyponatremia, with  sodium 126.  Hypokalemia, potassium 3.  Hypochloremia, 86.  No elevated LFTs.  No evidence of AKI.  No anion gap.  COVID flu negative.  Consults were necessary, including neurosurgery.  Neurosurgery has agreed to accept the patient to their service for admission and are requesting medicine consult to manage her comorbidities and recent nausea, vomiting and dizziness that led to her presentation.   Clinical Impression:  1. Intraventricular hemorrhage ([place])   2. Fall, initial encounter   3. Nausea vomiting and diarrhea     Final Clinical Impression(s) / ED Diagnoses Final diagnoses:  Intraventricular hemorrhage ([place])  Fall, initial encounter  Nausea vomiting and diarrhea    Rx / DC Orders ED Discharge Orders     None         Faylene Million, MD 11/11/21 2332    Elnora Morrison, MD 11/11/21 2337

## 2021-11-11 NOTE — Progress Notes (Signed)
Orthopedic Tech Progress Note Patient Details:  Melinda [name] 01-12-1935 [id number]  Patient ID: Melinda [name], female   DOB: [birth year], 86 y.o.   MRN: [id number] I attended trauma page. Karolee Stamps 11/11/2021, 10:38 PM

## 2021-11-12 ENCOUNTER — Inpatient Hospital Stay (HOSPITAL_COMMUNITY): Payer: Medicare Other

## 2021-11-12 DIAGNOSIS — R197 Diarrhea, unspecified: Secondary | ICD-10-CM

## 2021-11-12 DIAGNOSIS — I480 Paroxysmal atrial fibrillation: Secondary | ICD-10-CM | POA: Diagnosis not present

## 2021-11-12 DIAGNOSIS — I471 Supraventricular tachycardia: Secondary | ICD-10-CM | POA: Diagnosis not present

## 2021-11-12 DIAGNOSIS — I615 Nontraumatic intracerebral hemorrhage, intraventricular: Secondary | ICD-10-CM

## 2021-11-12 DIAGNOSIS — E871 Hypo-osmolality and hyponatremia: Secondary | ICD-10-CM

## 2021-11-12 DIAGNOSIS — E876 Hypokalemia: Secondary | ICD-10-CM

## 2021-11-12 DIAGNOSIS — I350 Nonrheumatic aortic (valve) stenosis: Secondary | ICD-10-CM

## 2021-11-12 LAB — ECHOCARDIOGRAM COMPLETE
AR max vel: 1.26 cm2
AV Area VTI: 1.57 cm2
AV Area mean vel: 1.31 cm2
AV Mean grad: 12 mmHg
AV Peak grad: 22.8 mmHg
Ao pk vel: 2.39 m/s
Area-P 1/2: 3.89 cm2
Height: 63 in
MV VTI: 1.99 cm2
P 1/2 time: 476 msec
S' Lateral: 3.3 cm
Weight: 2400 oz

## 2021-11-12 LAB — BASIC METABOLIC PANEL
Anion gap: 11 (ref 5–15)
BUN: 8 mg/dL (ref 8–23)
CO2: 27 mmol/L (ref 22–32)
Calcium: 8.7 mg/dL — ABNORMAL LOW (ref 8.9–10.3)
Chloride: 88 mmol/L — ABNORMAL LOW (ref 98–111)
Creatinine, Ser: 0.57 mg/dL (ref 0.44–1.00)
GFR, Estimated: >60 mL/min (ref >60)
Glucose, Bld: 124 mg/dL — ABNORMAL HIGH (ref 70–99)
Potassium: 3.4 mmol/L — ABNORMAL LOW (ref 3.5–5.1)
Sodium: 126 mmol/L — ABNORMAL LOW (ref 135–145)

## 2021-11-12 LAB — CBC
HCT: 42 % (ref 36.0–46.0)
Hemoglobin: 14.8 g/dL (ref 12.0–15.0)
MCH: 34.3 pg — ABNORMAL HIGH (ref 26.0–34.0)
MCHC: 35.2 g/dL (ref 30.0–36.0)
MCV: 97.4 fL (ref 80.0–100.0)
Platelets: 271 10*3/uL (ref 150–400)
RBC: 4.31 MIL/uL (ref 3.87–5.11)
RDW: 11.6 % (ref 11.5–15.5)
WBC: 9.7 10*3/uL (ref 4.0–10.5)
nRBC: 0 % (ref 0.0–0.2)

## 2021-11-12 LAB — MRSA NEXT GEN BY PCR, NASAL: MRSA by PCR Next Gen: NOT DETECTED

## 2021-11-12 LAB — GLUCOSE, CAPILLARY: Glucose-Capillary: 138 mg/dL — ABNORMAL HIGH (ref 70–99)

## 2021-11-12 MED ORDER — POTASSIUM CHLORIDE 10 MEQ/100ML IV SOLN
10.0000 meq | INTRAVENOUS | Status: AC
  Administered 2021-11-12: 10 meq via INTRAVENOUS
  Filled 2021-11-12: qty 100

## 2021-11-12 MED ORDER — IOHEXOL 350 MG/ML SOLN
80.0000 mL | Freq: Once | INTRAVENOUS | Status: AC | PRN
  Administered 2021-11-12: 80 mL via INTRAVENOUS

## 2021-11-12 MED ORDER — CHLORHEXIDINE GLUCONATE CLOTH 2 % EX PADS
6.0000 | MEDICATED_PAD | Freq: Every day | CUTANEOUS | Status: DC
  Administered 2021-11-12 – 2021-11-13 (×3): 6 via TOPICAL

## 2021-11-12 MED ORDER — LABETALOL HCL 5 MG/ML IV SOLN
5.0000 mg | INTRAVENOUS | Status: DC | PRN
  Administered 2021-11-14: 5 mg via INTRAVENOUS
  Administered 2021-11-14: 20 mg via INTRAVENOUS
  Administered 2021-11-14: 5 mg via INTRAVENOUS
  Administered 2021-11-14 – 2021-11-15 (×2): 20 mg via INTRAVENOUS
  Administered 2021-11-16 (×2): 10 mg via INTRAVENOUS
  Filled 2021-11-12 (×6): qty 4

## 2021-11-12 MED ORDER — ORAL CARE MOUTH RINSE: 15.0000 mL | OROMUCOSAL | Status: DC | PRN

## 2021-11-12 NOTE — Consult Note (Addendum)
Cardiology Consultation:   Patient ID: Melinda [name] MRN: [id number]; DOB: Nov 15, 1934  Admit date: 11/11/2021 Date of Consult: 11/12/2021  PCP:  Melinda Pillar, MD   [name] Providers Cardiologist:  Melinda Klein, MD   {   Patient Profile:   Melinda [name] is a 87 y.o. female with a hx of recurrent syncope s/p implantable loop recorder 04/2019, paroxysmal atrial fibrillation, atrial tachycardia, CAD with 75% ostial OM1 stenosis in 2015, Takotsubo cardiomyopathy with resolution, hypertension, breast cancer, prior fall and hyperlipidemia who is being seen 11/12/2021 for the evaluation of anticoagulation management at the request of Dr. Annette [name].  History of stress-induced cardiomyopathy May 2015 with EF of 30% which improved to 60% on guideline directed medical therapy.  Cath at that time showed 75% ostial stenosis of OM1.  However LV dysfunction out of proportion to stenosis.  Admitted 11/2013 with new onset atrial fibrillation with rapid ventricular rate.  Underwent implantable loop recorder placement January 2021 by Dr. Sallyanne Kuster for recurrent syncope.   Last echocardiogram December 2020 with LV function of 60 to 65%.  Mild to moderate AI and AS.   Patient with history of gait instability with prior fall leading to scalp hematoma.  Her loop recorder showed an episode of atrial fibrillation with controlled ventricular rate (average 98 bpm) on March 31, 2020, lasting for about 6 hours.  She has not had any new true atrial fibrillation since then.  History of Present Illness:   Ms. Melinda [name] brought by EMS yesterday evening after sustaining fall.  She was walking in the kitchen and felt dizzy/lost balance and fall backward into floor tile.  Hit back of head and left hip.  She was dealing with nausea and vomiting for past few days.  She was hypokalemic at 3.0 and hyponatremic at 126.  Seen by neurosurgery for intraventricular hemorrhage.  Anticoagulation held and given reversal  agent with Andexxa.   Patient lives with husband.  She use cane for ambulation outside.  She does not need any assistance getting set hours.  She denies chest pain, shortness orthopnea, edema or melena.  She is alert and oriented except place.  Thinks he is at home.  No EKG this admission. Tele showing sinus rhythm.    Past Medical History:  Diagnosis Date   Allergy    Anemia 1984   before hysterectomy   Breast cancer ([place])    bilateral   GERD (gastroesophageal reflux disease)    History of blood product transfusion 1984   Hyperlipidemia    Hypertension    Macular degeneration    right eye   Myocardial infarction ([place])    Osteopenia    Personal history of radiation therapy    Takotsubo cardiomyopathy     Past Surgical History:  Procedure Laterality Date   ABDOMINAL HYSTERECTOMY  1984   APPENDECTOMY     BREAST BIOPSY Right 04/29/05   right    BREAST BIOPSY Left 2006   BREAST LUMPECTOMY Left 04/19/04   left with sentinel node   BREAST LUMPECTOMY Right 2007   CATARACT EXTRACTION     bilateral   EYE SURGERY     LEFT HEART CATHETERIZATION WITH CORONARY ANGIOGRAM N/A 07/26/2013   Procedure: LEFT HEART CATHETERIZATION WITH CORONARY ANGIOGRAM;  Surgeon: Jettie Booze, MD;  Location: [facility] CATH LAB;  Service: Cardiovascular;  Laterality: N/A;   RETINAL LASER PROCEDURE     right      Inpatient Medications: Scheduled Meds:  Chlorhexidine Gluconate Cloth  6 each Topical  Daily   Continuous Infusions:  sodium chloride 75 mL/hr at 11/12/21 0200   PRN Meds: acetaminophen **OR** acetaminophen, HYDROmorphone (DILAUDID) injection, ondansetron **OR** ondansetron (ZOFRAN) IV  Allergies:    Allergies  Allergen Reactions   Nsaids Hives, Shortness Of Breath and Other (See Comments)    Extreme hives and difficulty breathing. "Has had to go to the ER."   Salicylates Other (See Comments)    (aspirin) Extreme hives and difficulty breathing. "Has had to go to the ER."   Codeine  Nausea Only   Etodolac Rash   Hydrochlorothiazide Other (See Comments)    Hyponatremia HCTZ    Social History:   Social History   Socioeconomic History   Marital status: Married    Spouse name: Melinda [name]   Number of children: 2   Years of education: college   Highest education level: Not on file  Occupational History   Not on file  Tobacco Use   Smoking status: Former    Types: Cigarettes    Quit date: 1990    Years since quitting: 33.6   Smokeless tobacco: Never  Vaping Use   Vaping Use: Never used  Substance and Sexual Activity   Alcohol use: Yes    Comment: 3 drinks per night, "too much"   Drug use: No   Sexual activity: Not on file  Other Topics Concern   Not on file  Social History Narrative   Lives with Spouse and dog   Drinks 3 cups of caffeine daily   Right handed             Social Determinants of Health   Financial Resource Strain: Not on file  Food Insecurity: Not on file  Transportation Needs: Not on file  Physical Activity: Not on file  Stress: Not on file  Social Connections: Not on file  Intimate Partner Violence: Not on file    Family History:    Family History  Problem Relation Age of Onset   Dementia Mother    Stroke Mother    Heart disease Mother 24   Cancer Father        Prostate   Heart disease Father 46   Cancer Maternal Aunt        Breast   Breast cancer Maternal Aunt    Cancer Maternal Grandmother    Breast cancer Maternal Grandmother    Heart attack Maternal Grandfather    Heart attack Paternal Grandfather      ROS:  Please see the history of present illness.  All other ROS reviewed and negative.     Physical Exam/Data:   Vitals:   11/12/21 0800 11/12/21 0930 11/12/21 1000 11/12/21 1030  BP: (!) 184/59 (!) 153/61 (!) 136/56 (!) 81/58  Pulse: (!) 110 80 68 79  Resp: (!) 21 18 (!) 21 (!) 24  Temp:      TempSrc:      SpO2: 93% 96% 98% 97%  Weight:      Height:        Intake/Output Summary (Last 24 hours) at  11/12/2021 1103 Last data filed at 11/12/2021 0400 Gross per 24 hour  Intake 225 ml  Output --  Net 225 ml      11/11/2021    8:06 PM 08/19/2021    9:49 AM 05/07/2021   11:54 AM  Last 3 Weights  Weight (lbs) 150 lb 159 lb 3.2 oz 150 lb  Weight (kg) 68.04 kg 72.213 kg 68.04 kg     Body mass index  is 26.57 kg/m.  General:  Well nourished, well developed, in no acute distress HEENT: normal Neck: no JVD Vascular: No carotid bruits; Distal pulses 2+ bilaterally Cardiac:  normal S1, S2; RRR; 3/6 systolic murmur  Lungs:  clear to auscultation bilaterally, no wheezing, rhonchi or rales  Abd: soft, nontender, no hepatomegaly  Ext: no edema Musculoskeletal:  No deformities, BUE and BLE strength normal and equal Skin: warm and dry  Neuro:  CNs 2-12 intact, no focal abnormalities noted Psych:  Normal affect   EKG:  The EKG was personally reviewed and demonstrates:  None this admission Telemetry:  Telemetry was personally reviewed and demonstrates:  Sinus rhythm   Relevant CV Studies:  Echo 03/04/2019  1. Left ventricular ejection fraction, by visual estimation, is 60 to  65%. The left ventricle has normal function. There is no left ventricular  hypertrophy.   2. Global right ventricle has normal systolic function.The right  ventricular size is normal. No increase in right ventricular wall  thickness.   3. Left atrial size was normal.   4. Right atrial size was normal.   5. The mitral valve is grossly normal. No evidence of mitral valve  regurgitation.   6. The tricuspid valve is grossly normal. Tricuspid valve regurgitation  is not demonstrated.   7. Aortic valve regurgitation is mild to moderate.   8. The aortic valve is tricuspid. Aortic valve regurgitation is mild to  moderate. Mild to moderate aortic valve stenosis.   9. The pulmonic valve was grossly normal. Pulmonic valve regurgitation is  trivial.  10. The atrial septum is grossly normal.   Cath 06/2013 ANGIOGRAPHIC DATA:    The left main coronary artery is widely patent.   The left anterior descending artery is a large vessel which wraps around the apex. In the proximal vessel, there is a 30%, eccentric stenosis. There is mild calcification. There are several small diagonals which are patent..   The left circumflex artery is a large vessel. There is a medium size ramus which is patent. There are mild irregularities in the circumflex. The first significant obtuse marginal has an ostial 75% stenosis. The next obtuse marginal is patent.   The right coronary artery is a medium size vessel. The posterior lateral artery is large and widely patent. The posterior descending artery a small but patent.  There does not appear to be any significant atherosclerosis in the RCA system.   LEFT VENTRICULOGRAM:  Left ventricular angiogram was done in the 30 RAO projection and revealed severely decreased left ventricular systolic function with an estimated ejection fraction of 25 %.  There is severe hypokinesis of the mid to distal anterior wall. There is severe hypokinesis of the mid to distal inferior wall. The base contracts vigorously. There is some contraction at the apex. LVEDP was 22 mmHg.   IMPRESSIONS:   Normal left main coronary artery. Mild disease in the proximal left anterior descending artery. Patent  left circumflex artery and its branches.. 75%, ostial stenosis of the OM1. The degree of LV dysfunction was well out of proportion to this stenosis. Widely patent right coronary artery. Severely decreased left ventricular systolic function in a pattern of Takatsubo cardiomyopathy.  LVEDP 22 mmHg.  Ejection fraction 25 %.   RECOMMENDATION:  Medical therapy for the left ventricular dysfunction. This appears to be a stress-induced cardiomyopathy. The wall motion abnormalities cannot be explained by her coronary disease. I suspect that after a few weeks of medical therapy, her LV function will be back  to normal. Of note, her  only stressful event was exercising yesterday. She had been sick about a week ago with a GI illness. She does not recall any stressful life events that happened.  Followup with Dr. Sallyanne Kuster.  Laboratory Data:  High Sensitivity Troponin:  No results for input(s): "TROPONINIHS" in the last 720 hours.   Chemistry Recent Labs  Lab 11/11/21 2020 11/11/21 2025 11/12/21 0106  NA 126* 125* 126*  K 3.0* 3.1* 3.4*  CL 86* 87* 88*  CO2 26  --  27  GLUCOSE 165* 163* 124*  BUN '8 9 8  '$ CREATININE 0.65 0.50 0.57  CALCIUM 8.8*  --  8.7*  GFRNONAA >60  --  >60  ANIONGAP 14  --  11    Recent Labs  Lab 11/11/21 2020  PROT 7.3  ALBUMIN 3.9  AST 24  ALT 18  ALKPHOS 53  BILITOT 0.9   Lipids No results for input(s): "CHOL", "TRIG", "HDL", "LABVLDL", "LDLCALC", "CHOLHDL" in the last 168 hours.  Hematology Recent Labs  Lab 11/11/21 2020 11/11/21 2025 11/12/21 0106  WBC 7.3  --  9.7  RBC 4.33  --  4.31  HGB 14.6 15.6* 14.8  HCT 42.1 46.0 42.0  MCV 97.2  --  97.4  MCH 33.7  --  34.3*  MCHC 34.7  --  35.2  RDW 11.7  --  11.6  PLT 261  --  271   Thyroid No results for input(s): "TSH", "FREET4" in the last 168 hours.  BNPNo results for input(s): "BNP", "PROBNP" in the last 168 hours.  DDimer No results for input(s): "DDIMER" in the last 168 hours.   Radiology/Studies:  CT T-SPINE NO CHARGE  Result Date: 11/12/2021 CLINICAL DATA:  Fall, polytrauma EXAM: CT THORACIC AND LUMBAR SPINE WITHOUT CONTRAST TECHNIQUE: Multidetector CT imaging of the thoracic and lumbar spine was performed without contrast. Multiplanar CT image reconstructions were also generated. RADIATION DOSE REDUCTION: This exam was performed according to the departmental dose-optimization program which includes automated exposure control, adjustment of the mA and/or kV according to patient size and/or use of iterative reconstruction technique. COMPARISON:  No prior dedicated thoracic or lumbar spine CT. Correlation is made with  08/06/2021 CT abdomen pelvis FINDINGS: CT THORACIC SPINE FINDINGS Alignment: No listhesis. Vertebrae: No acute fracture or suspicious osseous lesion. Paraspinal and other soft tissues: Please see same-day CT chest abdomen pelvis. Disc levels: Multilevel degenerative changes without high-grade spinal canal stenosis or neural foraminal narrowing. CT LUMBAR SPINE FINDINGS Segmentation: 5 lumbar type vertebrae. Alignment: Trace anterolisthesis L4 on L5. Trace retrolisthesis of L1 on L2. Vertebrae: Redemonstrated compression deformity of L2, which appears unchanged compared to 08/06/2001. 3 mm retropulsion of the posterosuperior cortex. Approximately 65% vertebral body height loss centrally. Additional increased density and degenerative changes at the anterior superior aspect of L1 are also unchanged compared to 08/06/2021. No acute fracture or suspicious osseous lesion. Paraspinal and other soft tissues: Please see same-day CT chest abdomen pelvis. Disc levels: No high-grade spinal canal stenosis. Moderate bilateral neural foraminal narrowing at L1-L2 IMPRESSION: CT THORACIC SPINE IMPRESSION No acute fracture or traumatic listhesis. CT LUMBAR SPINE IMPRESSION No acute fracture or traumatic listhesis. Redemonstrated chronic compression deformity of L2, which appears unchanged compared to 08/06/2021. For soft tissue findings, please see same-day CT chest abdomen pelvis. Electronically Signed   By: Merilyn Baba M.D.   On: 11/12/2021 02:10   CT L-SPINE NO CHARGE  Result Date: 11/12/2021 CLINICAL DATA:  Fall, polytrauma EXAM: CT THORACIC AND LUMBAR  SPINE WITHOUT CONTRAST TECHNIQUE: Multidetector CT imaging of the thoracic and lumbar spine was performed without contrast. Multiplanar CT image reconstructions were also generated. RADIATION DOSE REDUCTION: This exam was performed according to the departmental dose-optimization program which includes automated exposure control, adjustment of the mA and/or kV according to  patient size and/or use of iterative reconstruction technique. COMPARISON:  No prior dedicated thoracic or lumbar spine CT. Correlation is made with 08/06/2021 CT abdomen pelvis FINDINGS: CT THORACIC SPINE FINDINGS Alignment: No listhesis. Vertebrae: No acute fracture or suspicious osseous lesion. Paraspinal and other soft tissues: Please see same-day CT chest abdomen pelvis. Disc levels: Multilevel degenerative changes without high-grade spinal canal stenosis or neural foraminal narrowing. CT LUMBAR SPINE FINDINGS Segmentation: 5 lumbar type vertebrae. Alignment: Trace anterolisthesis L4 on L5. Trace retrolisthesis of L1 on L2. Vertebrae: Redemonstrated compression deformity of L2, which appears unchanged compared to 08/06/2001. 3 mm retropulsion of the posterosuperior cortex. Approximately 65% vertebral body height loss centrally. Additional increased density and degenerative changes at the anterior superior aspect of L1 are also unchanged compared to 08/06/2021. No acute fracture or suspicious osseous lesion. Paraspinal and other soft tissues: Please see same-day CT chest abdomen pelvis. Disc levels: No high-grade spinal canal stenosis. Moderate bilateral neural foraminal narrowing at L1-L2 IMPRESSION: CT THORACIC SPINE IMPRESSION No acute fracture or traumatic listhesis. CT LUMBAR SPINE IMPRESSION No acute fracture or traumatic listhesis. Redemonstrated chronic compression deformity of L2, which appears unchanged compared to 08/06/2021. For soft tissue findings, please see same-day CT chest abdomen pelvis. Electronically Signed   By: Merilyn Baba M.D.   On: 11/12/2021 02:10   CT ANGIO HEAD W OR WO CONTRAST  Result Date: 11/12/2021 CLINICAL DATA:  Intraparenchymal, intraventricular, and subarachnoid hemorrhage. Evaluate for aneurysm. EXAM: CT ANGIOGRAPHY HEAD TECHNIQUE: Multidetector CT imaging of the head was performed using the standard protocol during bolus administration of intravenous contrast.  Multiplanar CT image reconstructions and MIPs were obtained to evaluate the vascular anatomy. RADIATION DOSE REDUCTION: This exam was performed according to the departmental dose-optimization program which includes automated exposure control, adjustment of the mA and/or kV according to patient size and/or use of iterative reconstruction technique. CONTRAST:  48m OMNIPAQUE IOHEXOL 350 MG/ML SOLN COMPARISON:  01/17/2020 CTA head, correlation is also made with 11/11/2021 CT head. FINDINGS: CT HEAD FINDINGS For noncontrast findings, please see 11/11/2021 CT head. CTA HEAD FINDINGS Anterior circulation: Both internal carotid arteries are patent to the termini, with mild stenosis in the bilateral cavernous and proximal supraclinoid ICAs. A1 segments patent. Normal anterior communicating artery. Anterior cerebral arteries are patent to their distal aspects. No M1 stenosis or occlusion. MCA branches perfused and symmetric. Posterior circulation: Vertebral arteries patent to the vertebrobasilar junction with mild stenosis in the right-greater-than-left V4 posterior inferior cerebellar arteries patent proximally. Basilar patent to its distal aspect. Superior cerebellar arteries patent proximally. Patent P1 segments. PCAs perfused to their distal aspects without stenosis. The bilateral posterior communicating arteries are patent. Venous sinuses: As permitted by contrast timing, patent. Anatomic variants: None significant. Review of the MIP images confirms the above findings No evidence of active extravasation of contrast into the parenchymal or ventricular hemorrhages. IMPRESSION: 1. No intracranial large vessel occlusion with only mild stenosis in the bilateral intracranial ICAs and vertebral arteries. 2.  No evidence of active extravasation.  No aneurysm. Electronically Signed   By: AMerilyn BabaM.D.   On: 11/12/2021 01:51   CT CHEST ABDOMEN PELVIS W CONTRAST  Result Date: 11/12/2021 CLINICAL DATA:  Polytrauma, blunt.  Fall. EXAM: CT CHEST, ABDOMEN, AND PELVIS WITH CONTRAST TECHNIQUE: Multidetector CT imaging of the chest, abdomen and pelvis was performed following the standard protocol during bolus administration of intravenous contrast. RADIATION DOSE REDUCTION: This exam was performed according to the departmental dose-optimization program which includes automated exposure control, adjustment of the mA and/or kV according to patient size and/or use of iterative reconstruction technique. CONTRAST:  85m OMNIPAQUE IOHEXOL 350 MG/ML SOLN COMPARISON:  08/06/2021. FINDINGS: CT CHEST FINDINGS Cardiovascular: The heart is enlarged and there is no pericardial effusion. Scattered coronary artery calcifications are noted. There is atherosclerotic calcification of the aorta without evidence of aneurysm. Pulmonary trunk is normal in caliber. Mediastinum/Nodes: No mediastinal, hilar, or axillary lymphadenopathy. The thyroid gland, trachea, and esophagus are within normal limits. Small hiatal hernia. Lungs/Pleura: Mild atelectasis or scarring bilaterally. No effusion or pneumothorax. There is a nodule in the left lower lobe measuring 4 mm, axial image 73, unchanged. Musculoskeletal: Degenerative changes are present in the thoracic spine. No acute osseous abnormality is seen. A tubular structure is present in the left breast with calcification and is likely chronic. A presumed loop recorder device is also noted in the anterior chest wall on the left. CT ABDOMEN PELVIS FINDINGS Hepatobiliary: Hypodensities are present in the liver, the largest measuring 2.1 cm is cystic in attenuation. No hepatic injury or perihepatic hematoma. No biliary ductal dilatation. The gallbladder is without stones. Pancreas: Unremarkable. No pancreatic ductal dilatation or surrounding inflammatory changes. Spleen: No splenic injury or perisplenic hematoma. Adrenals/Urinary Tract: No adrenal nodule or mass. The kidneys enhance symmetrically. Subcentimeter  hypodensities are present in the kidneys bilaterally which are too small to further characterize. No renal calculus or hydronephrosis. The bladder is unremarkable. Stomach/Bowel: Stomach is within normal limits. Appendix is not seen. No evidence of bowel wall thickening, distention, or inflammatory changes. Multiple scattered diverticula are present along the colon without evidence of diverticulitis. Vascular/Lymphatic: Aortic atherosclerosis. No enlarged abdominal or pelvic lymph nodes. Reproductive: Status post hysterectomy. No adnexal masses. Other: No abdominopelvic ascites. Musculoskeletal: Degenerative changes are present in the lumbar spine. There is a compression deformity at L2 which is unchanged from the previous exam. An old healed fracture of the distal sacrum is noted. No acute fracture is seen. IMPRESSION: 1. No evidence of solid organ injury or acute fracture. 2. [name] old compression deformity at L2 and healed fracture of the distal sacrum. 3. Cardiomegaly with coronary artery calcifications. 4. Left lower lobe 4 mm pulmonary nodule, unchanged from the prior exam. 5. Diverticulosis without diverticulitis. 6. Aortic atherosclerosis. 7. Remaining incidental findings as described above. Electronically Signed   By: LBrett FairyM.D.   On: 11/12/2021 01:49   CT HEAD WO CONTRAST  Result Date: 11/11/2021 CLINICAL DATA:  Fall on blood thinners EXAM: CT HEAD WITHOUT CONTRAST TECHNIQUE: Contiguous axial images were obtained from the base of the skull through the vertex without intravenous contrast. RADIATION DOSE REDUCTION: This exam was performed according to the departmental dose-optimization program which includes automated exposure control, adjustment of the mA and/or kV according to patient size and/or use of iterative reconstruction technique. COMPARISON:  05/07/2021 FINDINGS: Brain: Focal area of parenchymal hemorrhage in the left temporal lobe measures 11 x 8 x 3 mm (series 6, image 44, series 5,  image 42, and series 3, image 16). Minimal surrounding edema without significant mass effect or midline shift. Larger amount of intraventricular hemorrhage, most prominently in the left lateral ventricle, from the frontal to the occipital to the temporal horn, but  also a smaller amount in the right ventricle third ventricle, cerebral aqueduct, and fourth ventricle, extending inferiorly into the spinal canal (series 3, image 6). Possible minimal enlargement of the lateral ventricles without frank hydrocephalus. No subdural collection. Possible small amount of subarachnoid hemorrhage in the left temporal lobe (series 5, image 40). No acute infarct or mass. Vascular: No hyperdense vessel. Skull: Normal. Negative for fracture or focal lesion. Sinuses/Orbits: No acute finding. Other: The mastoid air cells are well aerated. IMPRESSION: 1. Small area of parenchymal hemorrhage in the left temporal lobe without significant mass effect or midline shift. 2. Intraventricular hemorrhage, most prominently in the left lateral ventricle, but also in the right lateral ventricle, third ventricle, and fourth ventricle. Possible minimal enlargement of the lateral ventricles without frank hydrocephalus. 3. Possible small additional amount of subarachnoid hemorrhage in the left temporal lobe. These results were called by telephone at the time of interpretation on 11/11/2021 at 9:24 pm to provider [facility], who verbally acknowledged these results. Electronically Signed   By: Merilyn Baba M.D.   On: 11/11/2021 21:24   CT CERVICAL SPINE WO CONTRAST  Result Date: 11/11/2021 CLINICAL DATA:  Trauma.  Fall. EXAM: CT CERVICAL SPINE WITHOUT CONTRAST TECHNIQUE: Multidetector CT imaging of the cervical spine was performed without intravenous contrast. Multiplanar CT image reconstructions were also generated. RADIATION DOSE REDUCTION: This exam was performed according to the departmental dose-optimization program which includes automated exposure  control, adjustment of the mA and/or kV according to patient size and/or use of iterative reconstruction technique. COMPARISON:  Cervical spine CT 05/07/2021 FINDINGS: Alignment: Normal. Skull base and vertebrae: No acute fracture. No primary bone lesion or focal pathologic process. Soft tissues and spinal canal: No prevertebral fluid or swelling. No visible canal hematoma. Disc levels: There is disc space narrowing and endplate osteophyte formation most significant at C4-C5, C5-C6 and C6-C7. There are degenerative changes of facet joints bilaterally. There is moderate neural foraminal stenosis on the left at C3-C4, C4-C5 and C5-C6 secondary to facet arthropathy and uncovertebral spurring. There is no significant central canal stenosis at any level. Upper chest: Negative. Other: Intraventricular hemorrhage fourth ventricle. IMPRESSION: 1. No acute fracture or dislocation of the cervical spine. 2. Mild degenerative changes. 3. Intraventricular hemorrhage in the fourth ventricle. Please see CT of the head. Electronically Signed   By: Ronney Asters M.D.   On: 11/11/2021 21:08   DG Pelvis Portable  Result Date: 11/11/2021 CLINICAL DATA:  Recent fall with pelvic pain, initial encounter EXAM: PORTABLE PELVIS 1 VIEWS COMPARISON:  None Available. FINDINGS: Pelvic ring is intact. No acute fracture or dislocation is noted. Mild degenerative changes of lumbar spine are seen. IMPRESSION: No acute abnormality noted. Electronically Signed   By: Inez Catalina M.D.   On: 11/11/2021 20:23   DG Chest Port 1 View  Result Date: 11/11/2021 CLINICAL DATA:  Recent fall with chest pain, initial encounter EXAM: PORTABLE CHEST 1 VIEW COMPARISON:  01/17/2020 FINDINGS: Cardiac shadow is [name]. Loop recorder is again seen. Tortuous thoracic aorta is noted. The lungs are well aerated without focal infiltrate or effusion. No bony abnormality is seen. IMPRESSION: No acute abnormality noted. Electronically Signed   By: Inez Catalina M.D.    On: 11/11/2021 20:23     Assessment and Plan:   Paroxysmal atrial fibrillation Fall leading to intraventricular hemorrhage Thankfully, patient is maintaining sinus rhythm.  Also had fall in the past.  Now presenting with fall leading to intraventricular hemorrhage.  Anticoagulation held, given reversal agent.  Need  to weight risk and benefits of anticoagulation.   3. Murmur -Last echocardiogram December 2020 with LV function of 60 to 65%.  Mild to moderate AI and AS.  - Will update echo to r/o severe AS leading to dizziness and fall however patient does have hx of gait instability  4. CAD - No chest pain    Risk Assessment/Risk Scores:   CHA2DS2-VASc Score = 5   This indicates a 7.2% annual risk of stroke. The patient's score is based upon: CHF History: 0 HTN History: 1 Diabetes History: 0 Stroke History: 0 Vascular Disease History: 1 Age Score: 2 Gender Score: 1      For questions or updates, please contact [place] Please consult [email or web address] for contact info under    SignedLeanor Kail, PA  11/12/2021 11:03 AM   I have seen and examined the patient along with Leanor Kail, PA .  I have reviewed the chart, notes and new data.  I agree with PA/NP's note.  Key new complaints: Slightly disoriented, believes she is at home, but with appropriate hands she was able to figure out that she is at [facility] in [place].  Her husband Mikki Santee reports that she was already disoriented before her fall, following problems with "intestinal flu" and diarrhea. Key examination changes: Normal cardiovascular exam.  No obvious focal neurological deficits. Key new findings / data: Moderate hyponatremia, mild hypokalemia telemetry shows normal sinus rhythm as does her twelve-lead ECG.  Her loop recorder interrogation shows that she has not had a meaningful episode of atrial fibrillation in well over 12 months, although she still has occasional brief bursts of paroxysmal  atrial tachycardia.  PLAN: She has had several falls over the years and has now had serious consequences of head trauma.  Stop the anticoagulant permanently. Since the burden of atrial fibrillation is low and she has relatively advanced age, I do not think that she would benefit much from a Watchman device, although that this can be a discussion in the future if the prevalence of atrial fibrillation increases. Focus on correction of hyponatremia and other electrolyte disturbances.  Reevaluate neurological status once she is metabolically compensated.  Melinda Klein, MD, [place] [phone] 11/12/2021, 11:26 AM

## 2021-11-12 NOTE — Progress Notes (Cosign Needed Addendum)
Providing Compassionate, Quality Care - Together   Subjective: Nurse reports patient had multiple attempts to get out of bed overnight. Soft wrist restraints applied last night.  Objective: Vital signs in last 24 hours: Temp:  [97.8 F (36.6 C)-98.2 F (36.8 C)] 98 F (36.7 C) (08/15 0310) Pulse Rate:  [64-111] 72 (08/15 1100) Resp:  [13-32] 21 (08/15 1100) BP: (81-184)/(48-165) 145/53 (08/15 1100) SpO2:  [78 %-100 %] 96 % (08/15 1100) Weight:  [68 kg] 68 kg (08/14 2006)  Intake/Output from previous day: 08/14 0701 - 08/15 0700 In: 225 [I.V.:225] Out: -  Intake/Output this shift: Total I/O In: -  Out: 50 [Urine:50]  Alert, oriented to self and year Restless MAE, strength appears grossly intact.  Lab Results: Recent Labs    11/11/21 2020 11/11/21 2025 11/12/21 0106  WBC 7.3  --  9.7  HGB 14.6 15.6* 14.8  HCT 42.1 46.0 42.0  PLT 261  --  271   BMET Recent Labs    11/11/21 2020 11/11/21 2025 11/12/21 0106  NA 126* 125* 126*  K 3.0* 3.1* 3.4*  CL 86* 87* 88*  CO2 26  --  27  GLUCOSE 165* 163* 124*  BUN '8 9 8  '$ CREATININE 0.65 0.50 0.57  CALCIUM 8.8*  --  8.7*    Studies/Results: CT T-SPINE NO CHARGE  Result Date: 11/12/2021 CLINICAL DATA:  Fall, polytrauma EXAM: CT THORACIC AND LUMBAR SPINE WITHOUT CONTRAST TECHNIQUE: Multidetector CT imaging of the thoracic and lumbar spine was performed without contrast. Multiplanar CT image reconstructions were also generated. RADIATION DOSE REDUCTION: This exam was performed according to the departmental dose-optimization program which includes automated exposure control, adjustment of the mA and/or kV according to patient size and/or use of iterative reconstruction technique. COMPARISON:  No prior dedicated thoracic or lumbar spine CT. Correlation is made with 08/06/2021 CT abdomen pelvis FINDINGS: CT THORACIC SPINE FINDINGS Alignment: No listhesis. Vertebrae: No acute fracture or suspicious osseous lesion. Paraspinal  and other soft tissues: Please see same-day CT chest abdomen pelvis. Disc levels: Multilevel degenerative changes without high-grade spinal canal stenosis or neural foraminal narrowing. CT LUMBAR SPINE FINDINGS Segmentation: 5 lumbar type vertebrae. Alignment: Trace anterolisthesis L4 on L5. Trace retrolisthesis of L1 on L2. Vertebrae: Redemonstrated compression deformity of L2, which appears unchanged compared to 08/06/2001. 3 mm retropulsion of the posterosuperior cortex. Approximately 65% vertebral body height loss centrally. Additional increased density and degenerative changes at the anterior superior aspect of L1 are also unchanged compared to 08/06/2021. No acute fracture or suspicious osseous lesion. Paraspinal and other soft tissues: Please see same-day CT chest abdomen pelvis. Disc levels: No high-grade spinal canal stenosis. Moderate bilateral neural foraminal narrowing at L1-L2 IMPRESSION: CT THORACIC SPINE IMPRESSION No acute fracture or traumatic listhesis. CT LUMBAR SPINE IMPRESSION No acute fracture or traumatic listhesis. Redemonstrated chronic compression deformity of L2, which appears unchanged compared to 08/06/2021. For soft tissue findings, please see same-day CT chest abdomen pelvis. Electronically Signed   By: Merilyn Baba M.D.   On: 11/12/2021 02:10   CT L-SPINE NO CHARGE  Result Date: 11/12/2021 CLINICAL DATA:  Fall, polytrauma EXAM: CT THORACIC AND LUMBAR SPINE WITHOUT CONTRAST TECHNIQUE: Multidetector CT imaging of the thoracic and lumbar spine was performed without contrast. Multiplanar CT image reconstructions were also generated. RADIATION DOSE REDUCTION: This exam was performed according to the departmental dose-optimization program which includes automated exposure control, adjustment of the mA and/or kV according to patient size and/or use of iterative reconstruction technique. COMPARISON:  No prior dedicated thoracic or lumbar spine CT. Correlation is made with 08/06/2021 CT  abdomen pelvis FINDINGS: CT THORACIC SPINE FINDINGS Alignment: No listhesis. Vertebrae: No acute fracture or suspicious osseous lesion. Paraspinal and other soft tissues: Please see same-day CT chest abdomen pelvis. Disc levels: Multilevel degenerative changes without high-grade spinal canal stenosis or neural foraminal narrowing. CT LUMBAR SPINE FINDINGS Segmentation: 5 lumbar type vertebrae. Alignment: Trace anterolisthesis L4 on L5. Trace retrolisthesis of L1 on L2. Vertebrae: Redemonstrated compression deformity of L2, which appears unchanged compared to 08/06/2001. 3 mm retropulsion of the posterosuperior cortex. Approximately 65% vertebral body height loss centrally. Additional increased density and degenerative changes at the anterior superior aspect of L1 are also unchanged compared to 08/06/2021. No acute fracture or suspicious osseous lesion. Paraspinal and other soft tissues: Please see same-day CT chest abdomen pelvis. Disc levels: No high-grade spinal canal stenosis. Moderate bilateral neural foraminal narrowing at L1-L2 IMPRESSION: CT THORACIC SPINE IMPRESSION No acute fracture or traumatic listhesis. CT LUMBAR SPINE IMPRESSION No acute fracture or traumatic listhesis. Redemonstrated chronic compression deformity of L2, which appears unchanged compared to 08/06/2021. For soft tissue findings, please see same-day CT chest abdomen pelvis. Electronically Signed   By: Merilyn Baba M.D.   On: 11/12/2021 02:10   CT ANGIO HEAD W OR WO CONTRAST  Result Date: 11/12/2021 CLINICAL DATA:  Intraparenchymal, intraventricular, and subarachnoid hemorrhage. Evaluate for aneurysm. EXAM: CT ANGIOGRAPHY HEAD TECHNIQUE: Multidetector CT imaging of the head was performed using the standard protocol during bolus administration of intravenous contrast. Multiplanar CT image reconstructions and MIPs were obtained to evaluate the vascular anatomy. RADIATION DOSE REDUCTION: This exam was performed according to the  departmental dose-optimization program which includes automated exposure control, adjustment of the mA and/or kV according to patient size and/or use of iterative reconstruction technique. CONTRAST:  81m OMNIPAQUE IOHEXOL 350 MG/ML SOLN COMPARISON:  01/17/2020 CTA head, correlation is also made with 11/11/2021 CT head. FINDINGS: CT HEAD FINDINGS For noncontrast findings, please see 11/11/2021 CT head. CTA HEAD FINDINGS Anterior circulation: Both internal carotid arteries are patent to the termini, with mild stenosis in the bilateral cavernous and proximal supraclinoid ICAs. A1 segments patent. Normal anterior communicating artery. Anterior cerebral arteries are patent to their distal aspects. No M1 stenosis or occlusion. MCA branches perfused and symmetric. Posterior circulation: Vertebral arteries patent to the vertebrobasilar junction with mild stenosis in the right-greater-than-left V4 posterior inferior cerebellar arteries patent proximally. Basilar patent to its distal aspect. Superior cerebellar arteries patent proximally. Patent P1 segments. PCAs perfused to their distal aspects without stenosis. The bilateral posterior communicating arteries are patent. Venous sinuses: As permitted by contrast timing, patent. Anatomic variants: None significant. Review of the MIP images confirms the above findings No evidence of active extravasation of contrast into the parenchymal or ventricular hemorrhages. IMPRESSION: 1. No intracranial large vessel occlusion with only mild stenosis in the bilateral intracranial ICAs and vertebral arteries. 2.  No evidence of active extravasation.  No aneurysm. Electronically Signed   By: AMerilyn BabaM.D.   On: 11/12/2021 01:51   CT CHEST ABDOMEN PELVIS W CONTRAST  Result Date: 11/12/2021 CLINICAL DATA:  Polytrauma, blunt.  Fall. EXAM: CT CHEST, ABDOMEN, AND PELVIS WITH CONTRAST TECHNIQUE: Multidetector CT imaging of the chest, abdomen and pelvis was performed following the  standard protocol during bolus administration of intravenous contrast. RADIATION DOSE REDUCTION: This exam was performed according to the departmental dose-optimization program which includes automated exposure control, adjustment of the mA and/or kV according to patient  size and/or use of iterative reconstruction technique. CONTRAST:  67m OMNIPAQUE IOHEXOL 350 MG/ML SOLN COMPARISON:  08/06/2021. FINDINGS: CT CHEST FINDINGS Cardiovascular: The heart is enlarged and there is no pericardial effusion. Scattered coronary artery calcifications are noted. There is atherosclerotic calcification of the aorta without evidence of aneurysm. Pulmonary trunk is normal in caliber. Mediastinum/Nodes: No mediastinal, hilar, or axillary lymphadenopathy. The thyroid gland, trachea, and esophagus are within normal limits. Small hiatal hernia. Lungs/Pleura: Mild atelectasis or scarring bilaterally. No effusion or pneumothorax. There is a nodule in the left lower lobe measuring 4 mm, axial image 73, unchanged. Musculoskeletal: Degenerative changes are present in the thoracic spine. No acute osseous abnormality is seen. A tubular structure is present in the left breast with calcification and is likely chronic. A presumed loop recorder device is also noted in the anterior chest wall on the left. CT ABDOMEN PELVIS FINDINGS Hepatobiliary: Hypodensities are present in the liver, the largest measuring 2.1 cm is cystic in attenuation. No hepatic injury or perihepatic hematoma. No biliary ductal dilatation. The gallbladder is without stones. Pancreas: Unremarkable. No pancreatic ductal dilatation or surrounding inflammatory changes. Spleen: No splenic injury or perisplenic hematoma. Adrenals/Urinary Tract: No adrenal nodule or mass. The kidneys enhance symmetrically. Subcentimeter hypodensities are present in the kidneys bilaterally which are too small to further characterize. No renal calculus or hydronephrosis. The bladder is unremarkable.  Stomach/Bowel: Stomach is within normal limits. Appendix is not seen. No evidence of bowel wall thickening, distention, or inflammatory changes. Multiple scattered diverticula are present along the colon without evidence of diverticulitis. Vascular/Lymphatic: Aortic atherosclerosis. No enlarged abdominal or pelvic lymph nodes. Reproductive: Status post hysterectomy. No adnexal masses. Other: No abdominopelvic ascites. Musculoskeletal: Degenerative changes are present in the lumbar spine. There is a compression deformity at L2 which is unchanged from the previous exam. An old healed fracture of the distal sacrum is noted. No acute fracture is seen. IMPRESSION: 1. No evidence of solid organ injury or acute fracture. 2. Stable old compression deformity at L2 and healed fracture of the distal sacrum. 3. Cardiomegaly with coronary artery calcifications. 4. Left lower lobe 4 mm pulmonary nodule, unchanged from the prior exam. 5. Diverticulosis without diverticulitis. 6. Aortic atherosclerosis. 7. Remaining incidental findings as described above. Electronically Signed   By: LBrett FairyM.D.   On: 11/12/2021 01:49   CT HEAD WO CONTRAST  Result Date: 11/11/2021 CLINICAL DATA:  Fall on blood thinners EXAM: CT HEAD WITHOUT CONTRAST TECHNIQUE: Contiguous axial images were obtained from the base of the skull through the vertex without intravenous contrast. RADIATION DOSE REDUCTION: This exam was performed according to the departmental dose-optimization program which includes automated exposure control, adjustment of the mA and/or kV according to patient size and/or use of iterative reconstruction technique. COMPARISON:  05/07/2021 FINDINGS: Brain: Focal area of parenchymal hemorrhage in the left temporal lobe measures 11 x 8 x 3 mm (series 6, image 44, series 5, image 42, and series 3, image 16). Minimal surrounding edema without significant mass effect or midline shift. Larger amount of intraventricular hemorrhage, most  prominently in the left lateral ventricle, from the frontal to the occipital to the temporal horn, but also a smaller amount in the right ventricle third ventricle, cerebral aqueduct, and fourth ventricle, extending inferiorly into the spinal canal (series 3, image 6). Possible minimal enlargement of the lateral ventricles without frank hydrocephalus. No subdural collection. Possible small amount of subarachnoid hemorrhage in the left temporal lobe (series 5, image 40). No acute infarct or mass.  Vascular: No hyperdense vessel. Skull: Normal. Negative for fracture or focal lesion. Sinuses/Orbits: No acute finding. Other: The mastoid air cells are well aerated. IMPRESSION: 1. Small area of parenchymal hemorrhage in the left temporal lobe without significant mass effect or midline shift. 2. Intraventricular hemorrhage, most prominently in the left lateral ventricle, but also in the right lateral ventricle, third ventricle, and fourth ventricle. Possible minimal enlargement of the lateral ventricles without frank hydrocephalus. 3. Possible small additional amount of subarachnoid hemorrhage in the left temporal lobe. These results were called by telephone at the time of interpretation on 11/11/2021 at 9:24 pm to provider [facility], who verbally acknowledged these results. Electronically Signed   By: Merilyn Baba M.D.   On: 11/11/2021 21:24   CT CERVICAL SPINE WO CONTRAST  Result Date: 11/11/2021 CLINICAL DATA:  Trauma.  Fall. EXAM: CT CERVICAL SPINE WITHOUT CONTRAST TECHNIQUE: Multidetector CT imaging of the cervical spine was performed without intravenous contrast. Multiplanar CT image reconstructions were also generated. RADIATION DOSE REDUCTION: This exam was performed according to the departmental dose-optimization program which includes automated exposure control, adjustment of the mA and/or kV according to patient size and/or use of iterative reconstruction technique. COMPARISON:  Cervical spine CT 05/07/2021  FINDINGS: Alignment: Normal. Skull base and vertebrae: No acute fracture. No primary bone lesion or focal pathologic process. Soft tissues and spinal canal: No prevertebral fluid or swelling. No visible canal hematoma. Disc levels: There is disc space narrowing and endplate osteophyte formation most significant at C4-C5, C5-C6 and C6-C7. There are degenerative changes of facet joints bilaterally. There is moderate neural foraminal stenosis on the left at C3-C4, C4-C5 and C5-C6 secondary to facet arthropathy and uncovertebral spurring. There is no significant central canal stenosis at any level. Upper chest: Negative. Other: Intraventricular hemorrhage fourth ventricle. IMPRESSION: 1. No acute fracture or dislocation of the cervical spine. 2. Mild degenerative changes. 3. Intraventricular hemorrhage in the fourth ventricle. Please see CT of the head. Electronically Signed   By: Ronney Asters M.D.   On: 11/11/2021 21:08   DG Pelvis Portable  Result Date: 11/11/2021 CLINICAL DATA:  Recent fall with pelvic pain, initial encounter EXAM: PORTABLE PELVIS 1 VIEWS COMPARISON:  None Available. FINDINGS: Pelvic ring is intact. No acute fracture or dislocation is noted. Mild degenerative changes of lumbar spine are seen. IMPRESSION: No acute abnormality noted. Electronically Signed   By: Inez Catalina M.D.   On: 11/11/2021 20:23   DG Chest Port 1 View  Result Date: 11/11/2021 CLINICAL DATA:  Recent fall with chest pain, initial encounter EXAM: PORTABLE CHEST 1 VIEW COMPARISON:  01/17/2020 FINDINGS: Cardiac shadow is stable. Loop recorder is again seen. Tortuous thoracic aorta is noted. The lungs are well aerated without focal infiltrate or effusion. No bony abnormality is seen. IMPRESSION: No acute abnormality noted. Electronically Signed   By: Inez Catalina M.D.   On: 11/11/2021 20:23    Assessment/Plan: Patient had a ground level fall on Eliquis. Imaging showed intraventricular hemorrhage. Patient's exam appears  stable. Follow up CT head not completed this morning as patient received contrast for imaging and it is the radiology department's policy to hold follow up head imaging for 12 hours. As we are looking for developing hydrocephalus, this should not be an issue. The nurse and CT tech were made aware to move forward with the follow up head CT.   LOS: 1 day      Viona Gilmore, DNP, AGNP-C Nurse Practitioner  [facility] [place]  [address], [address], [place], [place] [address] P: [phone]    F: [phone]  11/12/2021, 12:04 PM

## 2021-11-12 NOTE — Progress Notes (Signed)
No issues or problems overnight.  Patient somewhat restless.  She denies significant headache.  She is having no current nausea vomiting.  She is afebrile.  Her vital signs are stable.  Awake and alertShe is.  She is oriented and appropriate.  Her speech is fluent.  Her judgment and insight appear intact.  Motor examination 5/5 bilaterally.  No drift.  Follow-up head CT scan demonstrates stable appearance of her intraventricular hemorrhage.  Her ventricles are perhaps slightly larger but not severely so.  She seems to be tolerating this well.  Patient is status post intraventricular hemorrhage complicated by anticoagulation.  No further hemorrhage evident on follow-up scan.  The patient has some mild hydrocephalus but does not appear to be symptomatic at present.  We will continue ICU observation.  Hoping that we can get her through this without requiring ventricular drainage.

## 2021-11-12 NOTE — Progress Notes (Addendum)
RN called Josh,NP with CNS in regard to patients increased agitation and BP. RN advised to give a dose of dilaudid to see if it will help with her agitation to bring down her BP.    RN also given verbal orders to for, PRN labetalol IV 5-'20mg'$  q2hrs Goal SBP <160.   If this does not correct blood pressure, RN verbal given orders for Cleveprex gtt with Goal SBP < 160.

## 2021-11-12 NOTE — Progress Notes (Signed)
Duplicate note

## 2021-11-12 NOTE — Progress Notes (Signed)
2D echocardiogram completed.  11/12/2021 3:31 PM Kelby Aline., MHA, RVT, RDCS, RDMS

## 2021-11-13 ENCOUNTER — Inpatient Hospital Stay (HOSPITAL_COMMUNITY): Payer: Medicare Other

## 2021-11-13 DIAGNOSIS — I615 Nontraumatic intracerebral hemorrhage, intraventricular: Secondary | ICD-10-CM | POA: Diagnosis not present

## 2021-11-13 DIAGNOSIS — I480 Paroxysmal atrial fibrillation: Secondary | ICD-10-CM | POA: Diagnosis not present

## 2021-11-13 LAB — BASIC METABOLIC PANEL
Anion gap: 12 (ref 5–15)
BUN: 13 mg/dL (ref 8–23)
CO2: 24 mmol/L (ref 22–32)
Calcium: 8.4 mg/dL — ABNORMAL LOW (ref 8.9–10.3)
Chloride: 93 mmol/L — ABNORMAL LOW (ref 98–111)
Creatinine, Ser: 0.6 mg/dL (ref 0.44–1.00)
GFR, Estimated: >60 mL/min (ref >60)
Glucose, Bld: 98 mg/dL (ref 70–99)
Potassium: 3.4 mmol/L — ABNORMAL LOW (ref 3.5–5.1)
Sodium: 129 mmol/L — ABNORMAL LOW (ref 135–145)

## 2021-11-13 LAB — MAGNESIUM: Magnesium: 2.1 mg/dL (ref 1.7–2.4)

## 2021-11-13 MED ORDER — POTASSIUM CHLORIDE CRYS ER 20 MEQ PO TBCR
20.0000 meq | EXTENDED_RELEASE_TABLET | Freq: Two times a day (BID) | ORAL | Status: AC
  Administered 2021-11-13 – 2021-11-16 (×6): 20 meq via ORAL
  Filled 2021-11-13 (×6): qty 1

## 2021-11-13 MED ORDER — SODIUM CHLORIDE 0.9 % IV SOLN: INTRAVENOUS | Status: DC

## 2021-11-13 NOTE — Progress Notes (Addendum)
Agitated last evening. Now somewhat sedated, but easy to awake. Denies headache. BP is normal now. Na better. K low. Has not had atrial fibrillation, but increased PVC frequency overnight. Avoid additional sedatives. Keep K>4.0.

## 2021-11-13 NOTE — Progress Notes (Signed)
Patient restless overnight and received Dilaudid which sedated her significantly.  Patient remains somewhat blunted this morning.  She will awaken.  She answers questions.  But she is certainly less spontaneous than she was yesterday.  Her motor and sensory function are intact.  Her pupils are equal and reactive.  Her gaze is normal.  Difficult to say whether her change in mental status is strictly because of medication effect versus worsening hydrocephalus.  Plan to get a follow-up CT scan of her brain today.  Currently if she is not in any extremis I do not think any type of emergent ventriculostomy is necessary unless her situation is further declines.

## 2021-11-13 NOTE — TOC CAGE-AID Note (Signed)
Transition of Care [facility]) - CAGE-AID Screening   Patient Details  Name: Melinda [name] MRN: [id number] Date of Birth: [birth year]  Transition of Care [facility]) CM/SW Contact:    Coralee Pesa, [place] Phone Number: 11/13/2021, 9:52 AM   Clinical Narrative: Per chart review, pt is not appropriate for CAGE- AID assessment due to disorientation.   CAGE-AID Screening: Substance Abuse Screening unable to be completed due to: : Patient unable to participate             Substance Abuse Education Offered: No

## 2021-11-13 NOTE — TOC Progression Note (Signed)
Transition of Care [facility]) - Progression Note    Patient Details  Name: Melinda [name] MRN: [id number] Date of Birth: 1934-04-28  Transition of Care [facility]) CM/SW [place], RN Phone Number:[phone]  11/13/2021, 4:20 PM  Clinical Narrative:     Transition of Care [facility]) Screening Note   Patient Details  Name: Melinda [name] Date of Birth: Aug 01, 1934   Transition of Care [facility]) CM/SW Contact:    Angelita Ingles, RN Phone Number: 11/13/2021, 4:20 PM    Transition of Care Department [facility]) has reviewed patient and no TOC needs have been identified at this time. We will continue to monitor patient advancement through interdisciplinary progression rounds.           Expected Discharge Plan and Services                                                 Social Determinants of Health (SDOH) Interventions    Readmission Risk Interventions     No data to display

## 2021-11-14 DIAGNOSIS — I615 Nontraumatic intracerebral hemorrhage, intraventricular: Secondary | ICD-10-CM | POA: Diagnosis not present

## 2021-11-14 LAB — BASIC METABOLIC PANEL
Anion gap: 8 (ref 5–15)
BUN: 9 mg/dL (ref 8–23)
CO2: 26 mmol/L (ref 22–32)
Calcium: 8.1 mg/dL — ABNORMAL LOW (ref 8.9–10.3)
Chloride: 97 mmol/L — ABNORMAL LOW (ref 98–111)
Creatinine, Ser: 0.51 mg/dL (ref 0.44–1.00)
GFR, Estimated: >60 mL/min (ref >60)
Glucose, Bld: 107 mg/dL — ABNORMAL HIGH (ref 70–99)
Potassium: 3.3 mmol/L — ABNORMAL LOW (ref 3.5–5.1)
Sodium: 131 mmol/L — ABNORMAL LOW (ref 135–145)

## 2021-11-14 LAB — GLUCOSE, CAPILLARY: Glucose-Capillary: 96 mg/dL (ref 70–99)

## 2021-11-14 MED ORDER — CHLORHEXIDINE GLUCONATE CLOTH 2 % EX PADS
6.0000 | MEDICATED_PAD | Freq: Every day | CUTANEOUS | Status: DC
Start: 2021-11-14 — End: 2021-11-14

## 2021-11-14 NOTE — Progress Notes (Signed)
Patient husband at bedside asking about patient home meds, and specifically one for anxiety. Reviewed home med list and patient doesn't appear to have been placed back on any home meds yet, called and left message with Dr. Marchelle Folks CMA about resuming home medications.

## 2021-11-14 NOTE — Progress Notes (Signed)
Rcvd report from [facility] for transfer to 3W. Pt tx w/ vss, recvd by NT and tele monitoring applied and verified.

## 2021-11-14 NOTE — Progress Notes (Signed)
Looks some better this morning.  Awake and interactive.  Remains confused just to place or time.  Denies headache.  No numbness paresthesias or weakness.  Follow-up head CT scan yesterday demonstrated some mild progression of her hydrocephalus but overall the clot seems to be regressing and I am hopeful that she may get through this without requiring ventricular drainage.  Status post intraventricular hemorrhage complicated by anticoagulation.  Patient with some moderate obstructive hydrocephalus but no evidence of decompensation.  Plan to transfer to floor.  Begin to mobilize.

## 2021-11-14 NOTE — Progress Notes (Signed)
Progress Note  Patient Name: Melinda [name] Date of Encounter: 11/14/2021  [facility] Cardiologist: Sanda Klein, MD   Subjective   Definitely more alert today, although she remains disoriented.  Denies headache. CT showed some progression of hydrocephalus, but overall clot burden seems to be regressing and Dr. Annette Stable hopes to be able to continue with conservative management.  Inpatient Medications    Scheduled Meds:  Chlorhexidine Gluconate Cloth  6 each Topical Daily   potassium chloride  20 mEq Oral BID   Continuous Infusions:  sodium chloride 75 mL/hr at 11/14/21 0700   PRN Meds: acetaminophen **OR** acetaminophen, HYDROmorphone (DILAUDID) injection, labetalol, ondansetron **OR** ondansetron (ZOFRAN) IV, mouth rinse   Vital Signs    Vitals:   11/14/21 0500 11/14/21 0600 11/14/21 0700 11/14/21 0715  BP: (!) 133/46 (!) 121/47 (!) 148/58   Pulse: 60 63 80   Resp: (!) 22 (!) 22 (!) 21   Temp:    (!) 97.5 F (36.4 C)  TempSrc:    Axillary  SpO2: 100% 100% 99%   Weight:      Height:        Intake/Output Summary (Last 24 hours) at 11/14/2021 0947 Last data filed at 11/14/2021 0700 Gross per 24 hour  Intake 1319.06 ml  Output 1050 ml  Net 269.06 ml      11/11/2021    8:06 PM 08/19/2021    9:49 AM 05/07/2021   11:54 AM  Last 3 Weights  Weight (lbs) 150 lb 159 lb 3.2 oz 150 lb  Weight (kg) 68.04 kg 72.213 kg 68.04 kg      Telemetry    Normal sinus rhythm- Personally Reviewed  ECG    No new tracing- Personally Reviewed  Physical Exam  Alert, but disoriented GEN: No acute distress.   Neck: No JVD Cardiac: RRR, 2/6 early peaking aortic ejection murmur, no diastolic murmurs, rubs, or gallops.  Respiratory: Clear to auscultation bilaterally. GI: Soft, nontender, non-distended  MS: No edema; No deformity. Neuro:  Nonfocal  Psych: Normal affect   Labs    High Sensitivity Troponin:  No results for input(s): "TROPONINIHS" in the last 720 hours.    Chemistry Recent Labs  Lab 11/11/21 2020 11/11/21 2025 11/12/21 0106 11/13/21 0120  NA 126* 125* 126* 129*  K 3.0* 3.1* 3.4* 3.4*  CL 86* 87* 88* 93*  CO2 26  --  27 24  GLUCOSE 165* 163* 124* 98  BUN '8 9 8 13  '$ CREATININE 0.65 0.50 0.57 0.60  CALCIUM 8.8*  --  8.7* 8.4*  MG  --   --   --  2.1  PROT 7.3  --   --   --   ALBUMIN 3.9  --   --   --   AST 24  --   --   --   ALT 18  --   --   --   ALKPHOS 53  --   --   --   BILITOT 0.9  --   --   --   GFRNONAA >60  --  >60 >60  ANIONGAP 14  --  11 12    Lipids No results for input(s): "CHOL", "TRIG", "HDL", "LABVLDL", "LDLCALC", "CHOLHDL" in the last 168 hours.  Hematology Recent Labs  Lab 11/11/21 2020 11/11/21 2025 11/12/21 0106  WBC 7.3  --  9.7  RBC 4.33  --  4.31  HGB 14.6 15.6* 14.8  HCT 42.1 46.0 42.0  MCV 97.2  --  97.4  MCH 33.7  --  34.3*  MCHC 34.7  --  35.2  RDW 11.7  --  11.6  PLT 261  --  271   Thyroid No results for input(s): "TSH", "FREET4" in the last 168 hours.  BNPNo results for input(s): "BNP", "PROBNP" in the last 168 hours.  DDimer No results for input(s): "DDIMER" in the last 168 hours.   Radiology    CT HEAD WO CONTRAST (5MM)  Result Date: 11/13/2021 CLINICAL DATA:  Follow-up hydrocephalus. Traumatic intracranial hemorrhage EXAM: CT HEAD WITHOUT CONTRAST TECHNIQUE: Contiguous axial images were obtained from the base of the skull through the vertex without intravenous contrast. RADIATION DOSE REDUCTION: This exam was performed according to the departmental dose-optimization program which includes automated exposure control, adjustment of the mA and/or kV according to patient size and/or use of iterative reconstruction technique. COMPARISON:  CT head 11/12/2021 and 11/11/2021 FINDINGS: Brain: Intraventricular hemorrhage is stable. Hemorrhage is in the lateral ventricles left greater than right extending into the third and fourth ventricles. Hydrocephalus shows mild progression compared to prior  studies. 1 cm area of parenchymal hemorrhage in the left temporoparietal white matter is unchanged. No new area of hemorrhage. Negative for acute infarct or mass. Vascular: Negative for hyperdense vessel Skull: Negative Sinuses/Orbits: Paranasal sinuses clear. Bilateral cataract extraction Other: None IMPRESSION: Progressive hydrocephalus which is now moderate. Intraventricular hemorrhage is stable. Small area of parenchymal hemorrhage in the left temporoparietal lobe is stable. No acute ischemic infarct. Electronically Signed   By: Franchot Gallo M.D.   On: 11/13/2021 13:17   ECHOCARDIOGRAM COMPLETE  Result Date: 11/12/2021    ECHOCARDIOGRAM REPORT   Patient Name:   Melinda [name] Date of Exam: 11/12/2021 Medical Rec #:  [id number]           Height:       63.0 in Accession #:    [id number]          Weight:       150.0 lb Date of Birth:  12-16-1934          BSA:          53.711 m Patient Age:    87 years            BP:           142/61 mmHg Patient Gender: F                   HR:           78 bpm. Exam Location:  Inpatient Procedure: 2D Echo, Cardiac Doppler, Color Doppler and 3D Echo Indications:    Aortic stenosis  History:        Patient has prior history of Echocardiogram examinations, most                 recent 03/04/2019. Previous Myocardial Infarction; Risk                 Factors:Hypertension and Dyslipidemia.  Sonographer:    Maudry Mayhew MHA, RDMS, RVT, RDCS Referring Phys: [id number] [facility]  Sonographer Comments: Image acquisition challenging due to uncooperative patient. IMPRESSIONS  1. Left ventricular ejection fraction, by estimation, is 55 to 60%. The left ventricle has normal function. The left ventricle has no regional wall motion abnormalities. There is mild concentric left ventricular hypertrophy. Left ventricular diastolic parameters are consistent with Grade I diastolic dysfunction (impaired relaxation).  2. Right ventricular systolic function is normal. The right  ventricular size is normal. There is  normal pulmonary artery systolic pressure. The estimated right ventricular systolic pressure is 85.6 mmHg.  3. The mitral valve is grossly normal. Trivial mitral valve regurgitation. No evidence of mitral stenosis.  4. The aortic valve is tricuspid. There is moderate calcification of the aortic valve. There is moderate thickening of the aortic valve. Aortic valve regurgitation is mild to moderate. Mild aortic valve stenosis. Aortic valve area, by VTI measures 1.57 cm. Aortic valve mean gradient measures 12.0 mmHg. Aortic valve Vmax measures 2.39 m/s.  5. The inferior vena cava is normal in size with greater than 50% respiratory variability, suggesting right atrial pressure of 3 mmHg. Comparison(s): No significant change from prior study. FINDINGS  Left Ventricle: Left ventricular ejection fraction, by estimation, is 55 to 60%. The left ventricle has normal function. The left ventricle has no regional wall motion abnormalities. The left ventricular internal cavity size was normal in size. There is  mild concentric left ventricular hypertrophy. Left ventricular diastolic parameters are consistent with Grade I diastolic dysfunction (impaired relaxation). Right Ventricle: The right ventricular size is normal. No increase in right ventricular wall thickness. Right ventricular systolic function is normal. There is normal pulmonary artery systolic pressure. The tricuspid regurgitant velocity is 1.57 m/s, and  with an assumed right atrial pressure of 3 mmHg, the estimated right ventricular systolic pressure is 31.4 mmHg. Left Atrium: Left atrial size was normal in size. Right Atrium: Right atrial size was normal in size. Pericardium: There is no evidence of pericardial effusion. Mitral Valve: The mitral valve is grossly normal. Trivial mitral valve regurgitation. No evidence of mitral valve stenosis. MV peak gradient, 8.5 mmHg. The mean mitral valve gradient is 4.0 mmHg. Tricuspid  Valve: The tricuspid valve is grossly normal. Tricuspid valve regurgitation is trivial. No evidence of tricuspid stenosis. Aortic Valve: The aortic valve is tricuspid. There is moderate calcification of the aortic valve. There is moderate thickening of the aortic valve. Aortic valve regurgitation is mild to moderate. Aortic regurgitation PHT measures 476 msec. Mild aortic stenosis is present. Aortic valve mean gradient measures 12.0 mmHg. Aortic valve peak gradient measures 22.8 mmHg. Aortic valve area, by VTI measures 1.57 cm. Pulmonic Valve: The pulmonic valve was grossly normal. Pulmonic valve regurgitation is trivial. No evidence of pulmonic stenosis. Aorta: The aortic root is normal in size and structure. Venous: The inferior vena cava is normal in size with greater than 50% respiratory variability, suggesting right atrial pressure of 3 mmHg. IAS/Shunts: The atrial septum is grossly normal.  LEFT VENTRICLE PLAX 2D LVIDd:         4.40 cm   Diastology LVIDs:         3.30 cm   LV e' medial:    3.70 cm/s LV PW:         1.10 cm   LV E/e' medial:  13.2 LV IVS:        1.20 cm   LV e' lateral:   5.55 cm/s LVOT diam:     2.20 cm   LV E/e' lateral: 8.8 LV SV:         70 LV SV Index:   41 LVOT Area:     3.80 cm                           3D Volume EF:                          3D  EF:        54 %                          LV EDV:       97 ml                          LV ESV:       44 ml                          LV SV:        53 ml RIGHT VENTRICLE TAPSE (M-mode): 1.9 cm LEFT ATRIUM             Index        RIGHT ATRIUM           Index LA diam:        3.20 cm 1.87 cm/m   RA Area:     11.00 cm LA Vol (A2C):   38.9 ml 22.73 ml/m  RA Volume:   22.90 ml  13.38 ml/m LA Vol (A4C):   37.1 ml 21.68 ml/m LA Biplane Vol: 39.2 ml 22.91 ml/m  AORTIC VALVE AV Area (Vmax):    1.26 cm AV Area (Vmean):   1.31 cm AV Area (VTI):     1.57 cm AV Vmax:           238.67 cm/s AV Vmean:          164.667 cm/s AV VTI:            0.445 m AV  Peak Grad:      22.8 mmHg AV Mean Grad:      12.0 mmHg LVOT Vmax:         79.00 cm/s LVOT Vmean:        56.900 cm/s LVOT VTI:          0.184 m LVOT/AV VTI ratio: 0.41 AI PHT:            476 msec  AORTA Ao Root diam: 3.45 cm MITRAL VALVE               TRICUSPID VALVE MV Area (PHT): 3.89 cm    TR Peak grad:   9.9 mmHg MV Area VTI:   1.99 cm    TR Vmax:        157.00 cm/s MV Peak grad:  8.5 mmHg MV Mean grad:  4.0 mmHg    SHUNTS MV Vmax:       1.46 m/s    Systemic VTI:  0.18 m MV Vmean:      92.5 cm/s   Systemic Diam: 2.20 cm MV Decel Time: 195 msec MV E velocity: 49.00 cm/s MV A velocity: 83.90 cm/s MV E/A ratio:  0.58 Eleonore Chiquito MD Electronically signed by Eleonore Chiquito MD Signature Date/Time: 11/12/2021/3:51:08 PM    Final    CT HEAD WO CONTRAST  Result Date: 11/12/2021 CLINICAL DATA:  Traumatic brain injury follow-up. EXAM: CT HEAD WITHOUT CONTRAST TECHNIQUE: Contiguous axial images were obtained from the base of the skull through the vertex without intravenous contrast. RADIATION DOSE REDUCTION: This exam was performed according to the departmental dose-optimization program which includes automated exposure control, adjustment of the mA and/or kV according to patient size and/or use of iterative reconstruction technique. COMPARISON:  CT head from yesterday. FINDINGS: Brain: Small parenchymal hemorrhage in the left temporal lobe is unchanged.  No significant interval change in amount of intraventricular hemorrhage involving the left-greater-than-right lateral ventricles, third ventricle, and fourth ventricle. No new area of hemorrhage. No midline shift. No evidence of acute infarction, hydrocephalus, or extra-axial collection. Vascular: Calcified atherosclerosis at the skull base. No hyperdense vessel. Skull: Normal. Negative for fracture or focal lesion. Sinuses/Orbits: No acute finding. Other: None. IMPRESSION: 1. Unchanged small parenchymal hemorrhage in the left temporal lobe. Unchanged prominent  intraventricular hemorrhage. Electronically Signed   By: Titus Dubin M.D.   On: 11/12/2021 12:57    Cardiac Studies   Echocardiogram 11/12/2021  1. Left ventricular ejection fraction, by estimation, is 55 to 60%. The left ventricle has normal function. The left ventricle has no regional wall motion abnormalities. There is mild concentric left ventricular hypertrophy. Left ventricular diastolic parameters are consistent with Grade I diastolic dysfunction (impaired relaxation).  2. Right ventricular systolic function is normal. The right ventricular size is normal. There is normal pulmonary artery systolic pressure. The estimated right ventricular systolic pressure is 41.9 mmHg.  3. The mitral valve is grossly normal. Trivial mitral valve regurgitation. No evidence of mitral stenosis.  4. The aortic valve is tricuspid. There is moderate calcification of the aortic valve. There is moderate thickening of the aortic valve. Aortic valve regurgitation is mild to moderate. Mild aortic valve stenosis. Aortic valve area, by VTI measures 1.57 cm. Aortic valve mean gradient measures 12.0 mmHg. Aortic valve Vmax measures 2.39 m/s.  5. The inferior vena cava is normal in size with greater than 50% respiratory variability, suggesting right atrial pressure of 3 mmHg. Comparison(s): No significant change from prior study.   Patient Profile     87 y.o. female with moderate nonobstructive CAD, paroxysmal atrial fibrillation, mild aortic valve stenosis/mild-moderate aortic valve insufficiency, HTN, frequent falls, presents with disorientation following diarrheal illness leading to a fall and intraventricular hemorrhage, moderate hyponatremia.  Assessment & Plan    Intraventricular hemorrhage: Hopefully can be managed conservatively, without ventriculostomy. Parox AFib: Risk of anticoagulation exceeds risk of embolic stroke.  We will keep her off anticoagulants for at least the foreseeable future.  May consider  referral for Watchman, but benefits less at her advanced age. AS/AI: Not yet hemodynamically important. CAD: Asymptomatic, moderate by previous cardiac catheterization.  Plan to start aspirin once she recovers from the intracranial hemorrhage. Hyponatremia: Improving Falls: Implantable loop recorder has not shown any evidence of arrhythmia at the time of her falls.     For questions or updates, please contact [place] Please consult [email or web address] for contact info under        Signed, Sanda Klein, MD  11/14/2021, 9:47 AM

## 2021-11-15 DIAGNOSIS — I615 Nontraumatic intracerebral hemorrhage, intraventricular: Secondary | ICD-10-CM | POA: Diagnosis not present

## 2021-11-15 LAB — BASIC METABOLIC PANEL
Anion gap: 9 (ref 5–15)
BUN: 5 mg/dL — ABNORMAL LOW (ref 8–23)
CO2: 23 mmol/L (ref 22–32)
Calcium: 7.6 mg/dL — ABNORMAL LOW (ref 8.9–10.3)
Chloride: 99 mmol/L (ref 98–111)
Creatinine, Ser: 0.43 mg/dL — ABNORMAL LOW (ref 0.44–1.00)
GFR, Estimated: >60 mL/min (ref >60)
Glucose, Bld: 126 mg/dL — ABNORMAL HIGH (ref 70–99)
Potassium: 3.3 mmol/L — ABNORMAL LOW (ref 3.5–5.1)
Sodium: 131 mmol/L — ABNORMAL LOW (ref 135–145)

## 2021-11-15 MED ORDER — ATORVASTATIN CALCIUM 10 MG PO TABS
20.0000 mg | ORAL_TABLET | Freq: Every day | ORAL | Status: DC
  Administered 2021-11-15 – 2021-11-20 (×6): 20 mg via ORAL
  Filled 2021-11-15 (×6): qty 2

## 2021-11-15 MED ORDER — SERTRALINE HCL 50 MG PO TABS
25.0000 mg | ORAL_TABLET | Freq: Every day | ORAL | Status: DC
  Administered 2021-11-15 – 2021-11-21 (×7): 25 mg via ORAL
  Filled 2021-11-15 (×7): qty 1

## 2021-11-15 MED ORDER — PROSIGHT PO TABS
1.0000 | ORAL_TABLET | Freq: Every day | ORAL | Status: DC
  Administered 2021-11-15 – 2021-11-21 (×7): 1 via ORAL
  Filled 2021-11-15 (×7): qty 1

## 2021-11-15 MED ORDER — ALBUTEROL SULFATE (2.5 MG/3ML) 0.083% IN NEBU: 3.0000 mL | INHALATION_SOLUTION | Freq: Four times a day (QID) | RESPIRATORY_TRACT | Status: DC | PRN

## 2021-11-15 MED ORDER — AMLODIPINE BESYLATE 5 MG PO TABS
5.0000 mg | ORAL_TABLET | Freq: Every day | ORAL | Status: DC
  Administered 2021-11-15 – 2021-11-21 (×7): 5 mg via ORAL
  Filled 2021-11-15 (×7): qty 1

## 2021-11-15 MED ORDER — LOSARTAN POTASSIUM 50 MG PO TABS
100.0000 mg | ORAL_TABLET | Freq: Every day | ORAL | Status: DC
  Administered 2021-11-15 – 2021-11-21 (×7): 100 mg via ORAL
  Filled 2021-11-15 (×7): qty 2

## 2021-11-15 MED ORDER — LORATADINE 10 MG PO TABS
10.0000 mg | ORAL_TABLET | Freq: Every day | ORAL | Status: DC
  Administered 2021-11-15 – 2021-11-21 (×7): 10 mg via ORAL
  Filled 2021-11-15 (×7): qty 1

## 2021-11-15 MED ORDER — METOPROLOL SUCCINATE ER 50 MG PO TB24
50.0000 mg | ORAL_TABLET | Freq: Every day | ORAL | Status: DC
  Administered 2021-11-15 – 2021-11-21 (×7): 50 mg via ORAL
  Filled 2021-11-15 (×7): qty 1

## 2021-11-15 MED ORDER — NITROGLYCERIN 0.4 MG SL SUBL: 0.4000 mg | SUBLINGUAL_TABLET | SUBLINGUAL | Status: DC | PRN

## 2021-11-15 MED ORDER — TIMOLOL MALEATE 0.5 % OP SOLN
1.0000 [drp] | Freq: Every day | OPHTHALMIC | Status: DC
Start: 2021-11-15 — End: 2021-11-21
  Administered 2021-11-15 – 2021-11-21 (×6): 1 [drp] via OPHTHALMIC
  Filled 2021-11-15: qty 5

## 2021-11-15 MED ORDER — LATANOPROST 0.005 % OP SOLN
1.0000 [drp] | Freq: Every day | OPHTHALMIC | Status: DC
  Administered 2021-11-16 – 2021-11-19 (×4): 1 [drp] via OPHTHALMIC
  Filled 2021-11-15: qty 2.5

## 2021-11-15 MED ORDER — COLON FORMULA 166.67-500 MG PO CAPS: 1.0000 | ORAL_CAPSULE | Freq: Every day | ORAL | Status: DC

## 2021-11-15 NOTE — Progress Notes (Signed)
Progress Note  Patient Name: Melinda [name] Date of Encounter: 11/15/2021  [facility] Cardiologist: Sanda Klein, MD   Subjective   Better today.  Seems sharper.  Identified me immediately has "my doctor" but was unable to remember my name without prompting.  Knows that she is in the hospital in [place]. Blood pressure substantially higher. Home blood pressure medications have been ordered. Remains hyponatremic, but sodium up to 131.  Inpatient Medications    Scheduled Meds:  amLODipine  5 mg Oral Daily   atorvastatin  20 mg Oral QHS   latanoprost  1 drop Both Eyes QHS   loratadine  10 mg Oral Daily   losartan  100 mg Oral Daily   metoprolol succinate  50 mg Oral Daily   multivitamin  1 tablet Oral Daily   potassium chloride  20 mEq Oral BID   sertraline  25 mg Oral Daily   timolol  1 drop Right Eye Daily   Continuous Infusions:  sodium chloride 75 mL/hr at 11/15/21 1000   PRN Meds: acetaminophen **OR** acetaminophen, albuterol, HYDROmorphone (DILAUDID) injection, labetalol, nitroGLYCERIN, ondansetron **OR** ondansetron (ZOFRAN) IV, mouth rinse   Vital Signs    Vitals:   11/15/21 0232 11/15/21 0359 11/15/21 0850 11/15/21 1119  BP:  (!) 146/48 (!) 179/62 (!) 173/63  Pulse:  67 75 76  Resp:  (!) '22 18 18  '$ Temp:  98.3 F (36.8 C) 97.8 F (36.6 C) 98.2 F (36.8 C)  TempSrc:  Axillary Oral Oral  SpO2: 92% 100% 97% 97%  Weight:  71.2 kg    Height:        Intake/Output Summary (Last 24 hours) at 11/15/2021 1250 Last data filed at 11/15/2021 0226 Gross per 24 hour  Intake 1619.94 ml  Output 600 ml  Net 1019.94 ml      11/15/2021    3:59 AM 11/11/2021    8:06 PM 08/19/2021    9:49 AM  Last 3 Weights  Weight (lbs) 156 lb 15.5 oz 150 lb 159 lb 3.2 oz  Weight (kg) 71.2 kg 68.04 kg 72.213 kg      Telemetry    Normal sinus rhythm- Personally Reviewed  ECG    No new tracing- Personally Reviewed  Physical Exam  Intermittently sleepy, but very  easy to awake and more oriented than yesterday GEN: No acute distress.   Neck: No JVD Cardiac: RRR, early peaking systolic ejection murmur in the aortic focus, no diastolic murmurs, rubs, or gallops.  Respiratory: Clear to auscultation bilaterally. GI: Soft, nontender, non-distended  MS: No edema; No deformity. Neuro:  Nonfocal  Psych: Normal affect   Labs    High Sensitivity Troponin:  No results for input(s): "TROPONINIHS" in the last 720 hours.   Chemistry Recent Labs  Lab 11/11/21 2020 11/11/21 2025 11/13/21 0120 11/14/21 1020 11/15/21 0319  NA 126*   < > 129* 131* 131*  K 3.0*   < > 3.4* 3.3* 3.3*  CL 86*   < > 93* 97* 99  CO2 26   < > '24 26 23  '$ GLUCOSE 165*   < > 98 107* 126*  BUN 8   < > 13 9 5*  CREATININE 0.65   < > 0.60 0.51 0.43*  CALCIUM 8.8*   < > 8.4* 8.1* 7.6*  MG  --   --  2.1  --   --   PROT 7.3  --   --   --   --   ALBUMIN 3.9  --   --   --   --  AST 24  --   --   --   --   ALT 18  --   --   --   --   ALKPHOS 53  --   --   --   --   BILITOT 0.9  --   --   --   --   GFRNONAA >60   < > >60 >60 >60  ANIONGAP 14   < > '12 8 9   '$ < > = values in this interval not displayed.    Lipids No results for input(s): "CHOL", "TRIG", "HDL", "LABVLDL", "LDLCALC", "CHOLHDL" in the last 168 hours.  Hematology Recent Labs  Lab 11/11/21 2020 11/11/21 2025 11/12/21 0106  WBC 7.3  --  9.7  RBC 4.33  --  4.31  HGB 14.6 15.6* 14.8  HCT 42.1 46.0 42.0  MCV 97.2  --  97.4  MCH 33.7  --  34.3*  MCHC 34.7  --  35.2  RDW 11.7  --  11.6  PLT 261  --  271   Thyroid No results for input(s): "TSH", "FREET4" in the last 168 hours.  BNPNo results for input(s): "BNP", "PROBNP" in the last 168 hours.  DDimer No results for input(s): "DDIMER" in the last 168 hours.   Radiology    CT HEAD WO CONTRAST (5MM)  Result Date: 11/13/2021 CLINICAL DATA:  Follow-up hydrocephalus. Traumatic intracranial hemorrhage EXAM: CT HEAD WITHOUT CONTRAST TECHNIQUE: Contiguous axial images were  obtained from the base of the skull through the vertex without intravenous contrast. RADIATION DOSE REDUCTION: This exam was performed according to the departmental dose-optimization program which includes automated exposure control, adjustment of the mA and/or kV according to patient size and/or use of iterative reconstruction technique. COMPARISON:  CT head 11/12/2021 and 11/11/2021 FINDINGS: Brain: Intraventricular hemorrhage is stable. Hemorrhage is in the lateral ventricles left greater than right extending into the third and fourth ventricles. Hydrocephalus shows mild progression compared to prior studies. 1 cm area of parenchymal hemorrhage in the left temporoparietal white matter is unchanged. No new area of hemorrhage. Negative for acute infarct or mass. Vascular: Negative for hyperdense vessel Skull: Negative Sinuses/Orbits: Paranasal sinuses clear. Bilateral cataract extraction Other: None IMPRESSION: Progressive hydrocephalus which is now moderate. Intraventricular hemorrhage is stable. Small area of parenchymal hemorrhage in the left temporoparietal lobe is stable. No acute ischemic infarct. Electronically Signed   By: Franchot Gallo M.D.   On: 11/13/2021 13:17    Cardiac Studies   Echocardiogram 11/12/2021   1. Left ventricular ejection fraction, by estimation, is 55 to 60%. The left ventricle has normal function. The left ventricle has no regional wall motion abnormalities. There is mild concentric left ventricular hypertrophy. Left ventricular diastolic parameters are consistent with Grade I diastolic dysfunction (impaired relaxation).  2. Right ventricular systolic function is normal. The right ventricular size is normal. There is normal pulmonary artery systolic pressure. The estimated right ventricular systolic pressure is 59.5 mmHg.  3. The mitral valve is grossly normal. Trivial mitral valve regurgitation. No evidence of mitral stenosis.  4. The aortic valve is tricuspid. There is moderate  calcification of the aortic valve. There is moderate thickening of the aortic valve. Aortic valve regurgitation is mild to moderate. Mild aortic valve stenosis. Aortic valve area, by VTI measures 1.57 cm. Aortic valve mean gradient measures 12.0 mmHg. Aortic valve Vmax measures 2.39 m/s.  5. The inferior vena cava is normal in size with greater than 50% respiratory variability, suggesting right atrial pressure of  3 mmHg. Comparison(s): No significant change from prior study.   Patient Profile     88 y.o. female with moderate nonobstructive CAD, paroxysmal atrial fibrillation, mild aortic valve stenosis/mild-moderate aortic valve insufficiency, HTN, frequent falls, presents with disorientation following diarrheal illness leading to a fall and intraventricular hemorrhage, moderate hyponatremia.  Assessment & Plan    Intraventricular hemorrhage: Hopefully can be managed conservatively, without ventriculostomy. Parox AFib: Risk of anticoagulation exceeds risk of embolic stroke.  We will keep her off anticoagulants for at least the foreseeable future.  May consider referral for Watchman, but benefits less at her advanced age. AS/AI: Not yet hemodynamically important. CAD: Asymptomatic, moderate by previous cardiac catheterization.  Plan to start aspirin once she recovers from the intracranial hemorrhage. Hyponatremia: Improving Falls: Implantable loop recorder has not shown any evidence of arrhythmia at the time of her falls. HTN: Starting her home blood pressure medications.  Would target systolic blood pressure in the 140-150 mmHg range, rather than "perfect" blood pressure control, due to her history of frequent falls.     For questions or updates, please contact [place] Please consult [email or web address] for contact info under        Signed, Sanda Klein, MD  11/15/2021, 12:50 PM

## 2021-11-15 NOTE — Care Management Important Message (Signed)
Important Message  Patient Details  Name: Melinda [name] MRN: [id number] Date of Birth: [birth year]   Medicare Important Message Given:  Yes     Orbie Pyo 11/15/2021, 2:05 PM

## 2021-11-15 NOTE — Progress Notes (Signed)
No new issues or problems overnight.  Patient complains of some neck stiffness.  Minimal headache.  No nausea no vomiting.  She is afebrile.  Her vital signs are stable except her blood pressure is trending up.  Urine output is good.  She is awake and alert.  Her speech is fluent.  She is much more oriented and appropriate today.  Her motor and sensory function are intact.  Status post intraventricular hemorrhage secondary to fall complicated by Eliquis.  Continue observation and efforts at mobilization.

## 2021-11-16 ENCOUNTER — Inpatient Hospital Stay (HOSPITAL_COMMUNITY): Payer: Medicare Other

## 2021-11-16 LAB — BASIC METABOLIC PANEL
Anion gap: 13 (ref 5–15)
BUN: <5 mg/dL — ABNORMAL LOW (ref 8–23)
CO2: 23 mmol/L (ref 22–32)
Calcium: 8 mg/dL — ABNORMAL LOW (ref 8.9–10.3)
Chloride: 94 mmol/L — ABNORMAL LOW (ref 98–111)
Creatinine, Ser: 0.4 mg/dL — ABNORMAL LOW (ref 0.44–1.00)
GFR, Estimated: >60 mL/min (ref >60)
Glucose, Bld: 120 mg/dL — ABNORMAL HIGH (ref 70–99)
Potassium: 3.3 mmol/L — ABNORMAL LOW (ref 3.5–5.1)
Sodium: 130 mmol/L — ABNORMAL LOW (ref 135–145)

## 2021-11-16 LAB — GLUCOSE, CAPILLARY: Glucose-Capillary: 145 mg/dL — ABNORMAL HIGH (ref 70–99)

## 2021-11-16 MED ORDER — LABETALOL HCL 5 MG/ML IV SOLN: 5.0000 mg | INTRAVENOUS | Status: DC | PRN

## 2021-11-16 NOTE — Progress Notes (Signed)
   Providing Compassionate, Quality Care - Together  NEUROSURGERY PROGRESS NOTE   S: No issues overnight.  Denies headache  O: EXAM:  BP (!) 188/64 (BP Location: Right Arm)   Pulse 71   Temp 98.3 F (36.8 C) (Oral)   Resp 16   Ht '5\' 3"'$  (1.6 m)   Wt 71.2 kg   SpO2 96%   BMI 27.81 kg/m   Awake, alert, oriented x3 PERRL Speech fluent, appropriate  CNs grossly intact  5/5 BUE/BLE   ASSESSMENT:  87 y.o. female with   IVH secondary to fall on Eliquis  PLAN: -Continue mobilization efforts, may benefit from rehab -Overall improving    Thank you for allowing me to participate in this patient's care.  Please do not hesitate to call with questions or concerns.   Elwin Sleight, [facility] Cell: [phone]

## 2021-11-16 NOTE — Progress Notes (Addendum)
Patient has had increased fatigue and sleepiness through out the morning; no appetite to eat; BP is high; notified MD at 1304; orders to repeat CT of head and repeat prn tx for SBP. After toileting patient on the bed side commode and returned to bed patient was alert briefly and talking to her husband at the bedside; her husband called on the call bell for assistance; the patient became lethargic suddenly and stopped interacting with him; vital signs repeated and presented a low BP of 116/63 and pulse of 62; head lowered,O2 applied; stat EKG and CBG preformed; patient aroused and bp elevated; patient stable and transported to CT with said RN; tolerated without distress;remains weak today and no appetite to eat; family at bedside; safety maintained. MD adjusted the range for the SBP medication tx prn.

## 2021-11-17 LAB — BASIC METABOLIC PANEL
Anion gap: 10 (ref 5–15)
BUN: 5 mg/dL — ABNORMAL LOW (ref 8–23)
CO2: 26 mmol/L (ref 22–32)
Calcium: 7.8 mg/dL — ABNORMAL LOW (ref 8.9–10.3)
Chloride: 92 mmol/L — ABNORMAL LOW (ref 98–111)
Creatinine, Ser: 0.44 mg/dL (ref 0.44–1.00)
GFR, Estimated: >60 mL/min (ref >60)
Glucose, Bld: 119 mg/dL — ABNORMAL HIGH (ref 70–99)
Potassium: 2.7 mmol/L — CL (ref 3.5–5.1)
Sodium: 128 mmol/L — ABNORMAL LOW (ref 135–145)

## 2021-11-17 MED ORDER — SENNOSIDES-DOCUSATE SODIUM 8.6-50 MG PO TABS
1.0000 | ORAL_TABLET | Freq: Every day | ORAL | Status: DC
  Administered 2021-11-17 – 2021-11-20 (×3): 1 via ORAL
  Filled 2021-11-17 (×4): qty 1

## 2021-11-17 MED ORDER — SODIUM CHLORIDE 1 G PO TABS
1.0000 g | ORAL_TABLET | Freq: Two times a day (BID) | ORAL | Status: DC
  Administered 2021-11-17 – 2021-11-21 (×9): 1 g via ORAL
  Filled 2021-11-17 (×10): qty 1

## 2021-11-17 MED ORDER — POTASSIUM CHLORIDE 10 MEQ/100ML IV SOLN
10.0000 meq | INTRAVENOUS | Status: AC
  Administered 2021-11-17 (×4): 10 meq via INTRAVENOUS
  Filled 2021-11-17 (×4): qty 100

## 2021-11-17 NOTE — Progress Notes (Signed)
Night reports patient awake through 3am; toilet ing through the night and not able to sleep; will focus on wake hours today to help patient get better sleep at night.

## 2021-11-17 NOTE — Progress Notes (Signed)
Sinus Rhythm on the telemetry monitoring.

## 2021-11-17 NOTE — Progress Notes (Signed)
BMET called to Dr. Reatha Armour; rounding soon with new orders; patient resting in bed.

## 2021-11-17 NOTE — Progress Notes (Signed)
   Providing Compassionate, Quality Care - Together  NEUROSURGERY PROGRESS NOTE   S: Restless overnight, denies headache this morning  O: EXAM:  BP (!) 167/54 (BP Location: Right Arm)   Pulse 63   Temp 97.7 F (36.5 C) (Oral)   Resp 16   Ht '5\' 3"'$  (1.6 m)   Wt 71.2 kg   SpO2 98%   BMI 27.81 kg/m   Awake, alert, oriented x3 PERRL Speech fluent, appropriate  CNs grossly intact  5/5 BUE/BLE   ASSESSMENT:  87 y.o. female with   IVH secondary to fall on Eliquis Hyponatremia, hypokalemia   PLAN:  -Continue mobilization efforts -Salt tabs, potassium replacement -Paroxysmal A-fib on monitor, no RVR -Blood pressure slightly more controlled with decreasing fluids      Thank you for allowing me to participate in this patient's care.  Please do not hesitate to call with questions or concerns.   Elwin Sleight, [facility] Cell: [phone]

## 2021-11-18 LAB — BASIC METABOLIC PANEL
Anion gap: 12 (ref 5–15)
BUN: 6 mg/dL — ABNORMAL LOW (ref 8–23)
CO2: 25 mmol/L (ref 22–32)
Calcium: 8 mg/dL — ABNORMAL LOW (ref 8.9–10.3)
Chloride: 94 mmol/L — ABNORMAL LOW (ref 98–111)
Creatinine, Ser: 0.47 mg/dL (ref 0.44–1.00)
GFR, Estimated: >60 mL/min (ref >60)
Glucose, Bld: 112 mg/dL — ABNORMAL HIGH (ref 70–99)
Potassium: 2.6 mmol/L — CL (ref 3.5–5.1)
Sodium: 131 mmol/L — ABNORMAL LOW (ref 135–145)

## 2021-11-18 LAB — MAGNESIUM: Magnesium: 1.8 mg/dL (ref 1.7–2.4)

## 2021-11-18 MED ORDER — POTASSIUM CHLORIDE CRYS ER 20 MEQ PO TBCR
40.0000 meq | EXTENDED_RELEASE_TABLET | Freq: Two times a day (BID) | ORAL | Status: AC
  Administered 2021-11-18 – 2021-11-19 (×4): 40 meq via ORAL
  Filled 2021-11-18 (×4): qty 2

## 2021-11-18 MED ORDER — POTASSIUM CHLORIDE 10 MEQ/100ML IV SOLN
10.0000 meq | INTRAVENOUS | Status: AC
  Administered 2021-11-18 (×4): 10 meq via INTRAVENOUS
  Filled 2021-11-18 (×3): qty 100

## 2021-11-18 NOTE — Progress Notes (Addendum)
Potasium level is 2.6 and Sodium is 131. Notified Glenford Peers  NP and received an order for 4 runs of  IV Potassium.

## 2021-11-18 NOTE — Progress Notes (Signed)
No new issues or problems overnight.  Patient is wide-awake but is somewhat more blunted and less verbal today.  She follows commands bilaterally.  Her pupils are equal and reactive to light.  Her gaze is conjugate.  She continues have difficulty with hypokalemia.  We will institute more aggressive potassium replacement.  I am afraid that her hydrocephalus is worsening.  At this point I do not think that ventriculostomy is advisable and would like to give her hemorrhage more time to clear.  She may eventually progress to a point where VP shunting is necessary.  We will begin efforts at therapy.

## 2021-11-19 ENCOUNTER — Inpatient Hospital Stay (HOSPITAL_COMMUNITY): Payer: Medicare Other

## 2021-11-19 LAB — BASIC METABOLIC PANEL
Anion gap: 11 (ref 5–15)
BUN: 7 mg/dL — ABNORMAL LOW (ref 8–23)
CO2: 27 mmol/L (ref 22–32)
Calcium: 7.9 mg/dL — ABNORMAL LOW (ref 8.9–10.3)
Chloride: 92 mmol/L — ABNORMAL LOW (ref 98–111)
Creatinine, Ser: 0.54 mg/dL (ref 0.44–1.00)
GFR, Estimated: >60 mL/min (ref >60)
Glucose, Bld: 113 mg/dL — ABNORMAL HIGH (ref 70–99)
Potassium: 2.9 mmol/L — ABNORMAL LOW (ref 3.5–5.1)
Sodium: 130 mmol/L — ABNORMAL LOW (ref 135–145)

## 2021-11-19 NOTE — TOC Initial Note (Signed)
Transition of Care [facility]) - Initial/Assessment Note    Patient Details  Name: Melinda [name] MRN: [id number] Date of Birth: 08/17/1934  Transition of Care [facility]) CM/SW Contact:    Geralynn Ochs, LCSW Phone Number: 11/19/2021, 10:33 AM  Clinical Narrative:         Patient from [facility] ID[facility], plan is to admit to SNF at [facility] when medically stable. CSW sent referral, confirmed with [facility] that patient will have a bed available. CSW to follow.          Expected Discharge Plan: [place] Barriers to Discharge: Continued Medical Work up   Patient Goals and CMS Choice Patient states their goals for this hospitalization and ongoing recovery are:: to get back to [facility].gov Compare Post Acute Care list provided to:: Patient Choice offered to / list presented to : Patient  Expected Discharge Plan and Services Expected Discharge Plan: [place] Choice: [place] Living arrangements for the past 2 months: Time                                      Prior Living Arrangements/Services Living arrangements for the past 2 months: [place] Lives with:: Facility Resident, Spouse Patient language and need for interpreter reviewed:: No Do you feel safe going back to the place where you live?: Yes      Need for Family Participation in Patient Care: No (Comment) Care giver support system in place?: Yes (comment)   Criminal Activity/Legal Involvement Pertinent to Current Situation/Hospitalization: No - Comment as needed  Activities of Daily Living      Permission Sought/Granted Permission sought to share information with : Facility Sport and exercise psychologist, Family Supports Permission granted to share information with : Yes, Verbal Permission Granted  Share Information with NAME: Herbie Baltimore  Permission granted to share info w AGENCY:  [place] granted to share info w Relationship: Spouse     Emotional Assessment Appearance:: Appears stated age Attitude/Demeanor/Rapport: Engaged Affect (typically observed): Appropriate Orientation: : Oriented to Self, Oriented to Place, Oriented to  Time, Oriented to Situation Alcohol / Substance Use: Not Applicable Psych Involvement: No (comment)  Admission diagnosis:  IVH (intraventricular hemorrhage) (HCC) [I61.5] Nausea vomiting and diarrhea [R11.2, R19.7] Intraventricular hemorrhage ([place]) [I61.5] Fall, initial encounter [W19.XXXA] Patient Active Problem List   Diagnosis Date Noted   IVH (intraventricular hemorrhage) ([place]) 11/11/2021   Numbness 05/31/2020   Exudative age-related macular degeneration of right eye with inactive choroidal neovascularization ([place]) 04/19/2020   Intermediate stage nonexudative age-related macular degeneration of both eyes 04/19/2020   Pseudophakia 04/19/2020   Posterior vitreous detachment of left eye 04/19/2020   History of vitrectomy 04/19/2020   Primary open angle glaucoma of both eyes, mild stage 04/19/2020   Syncope 03/04/2019   Alcohol abuse 03/04/2019   Fall at home, initial encounter 03/04/2019   Vasovagal syncope 12/31/2017   Long term current use of anticoagulant 11/26/2015   Allergic rhinitis due to pollen 11/23/2015   Asthmatic bronchitis 11/23/2015   History of colon polyps 11/23/2015   Other specified disorders of bone density and structure, other site 11/23/2015   Glaucoma 11/23/2015   Mood disorder ([place]) 11/23/2015   Vitamin D deficiency 11/23/2015   Paroxysmal atrial fibrillation ([place]) 12/08/2013   Paroxysmal A-fib ([place]) 12/08/2013   CAD (coronary artery disease) - 75% ostial OM1 10/21/2013  Takotsubo cardiomyopathy    NSTEMI (non-ST elevated myocardial infarction) ([place]) 07/25/2013   Chest pain 09/09/2012   GERD (gastroesophageal reflux disease) 09/09/2012   Essential hypertension 09/09/2012    Hypercholesterolemia 09/09/2012   hx: breast cancer, left, invasive ductal carcinoma, right, DCIS 02/19/2011   PCP:  [name], MD Pharmacy:   [facility] [id number] - [address], [place] [address] Phone: [phone] Fax: [phone]  [facility] [address] [address] Phone: [phone] Fax: [phone]     Social Determinants of Health (SDOH) Interventions    Readmission Risk Interventions     No data to display

## 2021-11-19 NOTE — NC FL2 (Signed)
[place] LEVEL OF CARE SCREENING TOOL     IDENTIFICATION  Patient Name: Melinda [name] Birthdate: 28-Nov-1934 Sex: female Admission Date (Current Location): 11/11/2021  [facility] and [place] Number:  Herbalist and Address:  The [place]. [facility], [place] [address], [place], [place] [address]      Provider Number: [id number]  Attending Physician Name and Address:  Earnie Larsson, MD  Relative Name and Phone Number:       Current Level of Care: Hospital Recommended Level of Care: [place] Prior Approval Number:    Date Approved/Denied:   PASRR Number: [id number]  Discharge Plan: SNF    Current Diagnoses: Patient Active Problem List   Diagnosis Date Noted   IVH (intraventricular hemorrhage) ([place]) 11/11/2021   Numbness 05/31/2020   Exudative age-related macular degeneration of right eye with inactive choroidal neovascularization ([place]) 04/19/2020   Intermediate stage nonexudative age-related macular degeneration of both eyes 04/19/2020   Pseudophakia 04/19/2020   Posterior vitreous detachment of left eye 04/19/2020   History of vitrectomy 04/19/2020   Primary open angle glaucoma of both eyes, mild stage 04/19/2020   Syncope 03/04/2019   Alcohol abuse 03/04/2019   Fall at home, initial encounter 03/04/2019   Vasovagal syncope 12/31/2017   Long term current use of anticoagulant 11/26/2015   Allergic rhinitis due to pollen 11/23/2015   Asthmatic bronchitis 11/23/2015   History of colon polyps 11/23/2015   Other specified disorders of bone density and structure, other site 11/23/2015   Glaucoma 11/23/2015   Mood disorder ([place]) 11/23/2015   Vitamin D deficiency 11/23/2015   Paroxysmal atrial fibrillation ([place]) 12/08/2013   Paroxysmal A-fib ([place]) 12/08/2013   CAD (coronary artery disease) - 75% ostial OM1 10/21/2013   Takotsubo cardiomyopathy    NSTEMI (non-ST elevated myocardial infarction) ([place]) 07/25/2013   Chest  pain 09/09/2012   GERD (gastroesophageal reflux disease) 09/09/2012   Essential hypertension 09/09/2012   Hypercholesterolemia 09/09/2012   hx: breast cancer, left, invasive ductal carcinoma, right, DCIS 02/19/2011    Orientation RESPIRATION BLADDER Height & Weight     Self, Time, Situation, Place  Normal Continent Weight: 156 lb 15.5 oz (71.2 kg) Height:  '5\' 3"'$  (160 cm)  BEHAVIORAL SYMPTOMS/MOOD NEUROLOGICAL BOWEL NUTRITION STATUS      Continent Diet (Regular)  AMBULATORY STATUS COMMUNICATION OF NEEDS Skin   Extensive Assist Verbally Normal                       Personal Care Assistance Level of Assistance  Bathing, Feeding, Dressing Bathing Assistance: Maximum assistance Feeding assistance: Limited assistance Dressing Assistance: Maximum assistance     Functional Limitations Info  Sight Sight Info: Impaired        SPECIAL CARE FACTORS FREQUENCY  PT (By licensed PT), OT (By licensed OT)     PT Frequency: 5x/wk OT Frequency: 5x/wk            Contractures Contractures Info: Not present    Additional Factors Info  Code Status, Allergies, Psychotropic Code Status Info: Full Allergies Info: Nsaids, Salicylates, Codeine, Etodolac, Hydrochlorothiazide Psychotropic Info: Zoloft '25mg'$  daily         Current Medications (11/19/2021):  This is the current hospital active medication list Current Facility-Administered Medications  Medication Dose Route Frequency Provider Last Rate Last Admin   0.9 %  sodium chloride infusion   Intravenous Continuous Dawley, Troy C, DO 50 mL/hr at 11/18/21 1703 New Bag at 11/18/21 1703  acetaminophen (TYLENOL) tablet 650 mg  650 mg Oral Q6H PRN Earnie Larsson, MD   650 mg at 11/18/21 2131   Or   acetaminophen (TYLENOL) suppository 650 mg  650 mg Rectal Q6H PRN Earnie Larsson, MD       albuterol (PROVENTIL) (2.5 MG/3ML) 0.083% nebulizer solution 3 mL  3 mL Inhalation Q6H PRN Earnie Larsson, MD       amLODipine (NORVASC) tablet 5 mg  5 mg Oral  Daily Earnie Larsson, MD   5 mg at 11/19/21 8338   atorvastatin (LIPITOR) tablet 20 mg  20 mg Oral QHS Earnie Larsson, MD   20 mg at 11/18/21 2133   HYDROmorphone (DILAUDID) injection 0.5-1 mg  0.5-1 mg Intravenous Q2H PRN Earnie Larsson, MD   1 mg at 11/18/21 2132   labetalol (NORMODYNE) injection 5-20 mg  5-20 mg Intravenous Q2H PRN Dawley, Troy C, DO       latanoprost (XALATAN) 0.005 % ophthalmic solution 1 drop  1 drop Both Eyes QHS Earnie Larsson, MD   1 drop at 11/18/21 2134   loratadine (CLARITIN) tablet 10 mg  10 mg Oral Daily Earnie Larsson, MD   10 mg at 11/19/21 0839   losartan (COZAAR) tablet 100 mg  100 mg Oral Daily Earnie Larsson, MD   100 mg at 11/19/21 2505   metoprolol succinate (TOPROL-XL) 24 hr tablet 50 mg  50 mg Oral Daily Earnie Larsson, MD   50 mg at 11/19/21 3976   multivitamin (PROSIGHT) tablet 1 tablet  1 tablet Oral Daily Earnie Larsson, MD   1 tablet at 11/19/21 0837   nitroGLYCERIN (NITROSTAT) SL tablet 0.4 mg  0.4 mg Sublingual Q5 min PRN Earnie Larsson, MD       ondansetron [facility]) tablet 4 mg  4 mg Oral Q6H PRN Earnie Larsson, MD       Or   ondansetron [facility]) injection 4 mg  4 mg Intravenous Q6H PRN Earnie Larsson, MD   4 mg at 11/13/21 7341   Oral care mouth rinse  15 mL Mouth Rinse PRN Earnie Larsson, MD       potassium chloride SA (KLOR-CON M) CR tablet 40 mEq  40 mEq Oral BID Earnie Larsson, MD   40 mEq at 11/19/21 9379   senna-docusate (Senokot-S) tablet 1 tablet  1 tablet Oral QHS Dawley, Troy C, DO   1 tablet at 11/17/21 2152   sertraline (ZOLOFT) tablet 25 mg  25 mg Oral Daily Earnie Larsson, MD   25 mg at 11/19/21 0240   sodium chloride tablet 1 g  1 g Oral BID WC Dawley, Troy C, DO   1 g at 11/19/21 9735   timolol (TIMOPTIC) 0.5 % ophthalmic solution 1 drop  1 drop Right Eye Daily Earnie Larsson, MD   1 drop at 11/19/21 3299     Discharge Medications: Please see discharge summary for a list of discharge medications.  Relevant Imaging Results:  Relevant Lab Results:   Additional  Information SS#: [id number]  Geralynn Ochs, LCSW

## 2021-11-19 NOTE — Evaluation (Signed)
Physical Therapy Evaluation Patient Details Name: Melinda [name] MRN: [id number] DOB: 12/09/1934 Today's Date: 11/19/2021  History of Present Illness  87 year old female with a mechanical ground-level fall today.  Patient complains of mild headache with some dizziness.  She has had some intermittent n/v. PMH: HTN, R eye macular degeneration, cardiac history, bilat breast ca   Clinical Impression  Pt admitted with above. Spoke with spouse on the phone to obtain home set up and PLOF as pt poor historian. Per spouse pt was indep with SPC PTA. Pt now presenting with lethargy, disoriented to situation, delayed processing, decreased insight to deficits, impaired motor planning, poor attn span/easily distracted and requires mod/maxA for all mobility.Recommend ST-SNF upon d/c to achieve maximal functional recovery for safe transition back to ILF apartment with spouse.        Recommendations for follow up therapy are one component of a multi-disciplinary discharge planning process, led by the attending physician.  Recommendations may be updated based on patient status, additional functional criteria and insurance authorization.  Follow Up Recommendations Skilled nursing-short term rehab (<3 hours/day) (prefers [facility] as they live in [place]) Can patient physically be transported by private vehicle: No    Assistance Recommended at Discharge Frequent or constant Supervision/Assistance  Patient can return home with the following  Two people to help with walking and/or transfers;Two people to help with bathing/dressing/bathroom;Assistance with feeding;Assistance with cooking/housework;Direct supervision/assist for medications management;Direct supervision/assist for financial management;Assist for transportation;Help with stairs or ramp for entrance    Equipment Recommendations  (to be determined at next venue)  Recommendations for Other Services       Functional Status  Assessment Patient has had a recent decline in their functional status and/or demonstrates limited ability to make significant improvements in function in a reasonable and predictable amount of time     Precautions / Restrictions Precautions Precautions: Fall Restrictions Weight Bearing Restrictions: No      Mobility  Bed Mobility Overal bed mobility: Needs Assistance Bed Mobility: Supine to Sit     Supine to sit: Mod assist, Max assist, HOB elevated     General bed mobility comments: max directional verbal and tactile cues, pt given step by step cues to bring LEs to EOB, modA for trunk elevation, pt used bed rail, maxA to scoot to EOB via pulling of bed pad    Transfers Overall transfer level: Needs assistance Equipment used: 1 person hand held assist, Rolling walker (2 wheels) Transfers: Sit to/from Stand, Bed to chair/wheelchair/BSC Sit to Stand: Mod assist   Step pivot transfers: Mod assist, Max assist       General transfer comment: Pt requiring constant step by step verbal and tactile cues to stay on task/complete task. Pt completed 3 sit to stands but required 3 attempts each time prior to complete stand. pt requiring tactile cues in combination with verbal via PT touching each leg to advance during step pvt transfer to [facility] and then to recliner, pt with no spatial awareness trying to sit prior to getting to [facility] and chair. trialed use of RW for 2nd transfer to keep pt's hands in a safe place however pt required maxA for walker management    Ambulation/Gait               General Gait Details: unable this date  Stairs            Wheelchair Mobility    Modified Rankin (Stroke Patients Only) Modified Rankin (Stroke Patients Only) Pre-Morbid Rankin Score:  Slight disability Modified Rankin: Severe disability     Balance Overall balance assessment: Needs assistance Sitting-balance support: Feet supported, No upper extremity supported Sitting  balance-Leahy Scale: Fair     Standing balance support: Reliant on assistive device for balance, During functional activity, Bilateral upper extremity supported Standing balance-Leahy Scale: Poor Standing balance comment: dependent on external assist                             Pertinent Vitals/Pain Pain Assessment Pain Assessment: Faces Faces Pain Scale: Hurts even more Pain Location: neck (when asked upon PT arrival pt denied having pain, once pt began moving pt kept taking R hand and rubbing R side of her neck complaining of pain) Pain Descriptors / Indicators: Sore Pain Intervention(s): Repositioned, Monitored during session    Home Living Family/patient expects to be discharged to:: Private residence (lives at [place]) Living Arrangements: Spouse/significant other Available Help at Discharge: Family;Available 24 hours/day Type of Home: Apartment Home Access: Level entry       Home Layout: One level Home Equipment: Cane - single point;Hand held shower head Additional Comments: lives [place], has tub shower and small walk in shower, can't fit shower seat in there    Prior Function Prior Level of Function : Independent/Modified Independent             Mobility Comments: used SPC in community, sometimes in home as well ADLs Comments: indep     Hand Dominance   Dominant Hand: Right    Extremity/Trunk Assessment   Upper Extremity Assessment Upper Extremity Assessment: Generalized weakness    Lower Extremity Assessment Lower Extremity Assessment: Generalized weakness    Cervical / Trunk Assessment Cervical / Trunk Assessment: Other exceptions Cervical / Trunk Exceptions: very sore neck, pt kept rubbing it t/o session  Communication   Communication: Expressive difficulties (delayed response time, soft spoken)  Cognition Arousal/Alertness: Awake/alert (but will fall asleep easily) Behavior During Therapy: Flat  affect Overall Cognitive Status: Impaired/Different from baseline Area of Impairment: Orientation, Attention, Memory, Following commands, Safety/judgement, Awareness, Problem solving                 Orientation Level: Disoriented to, Situation (stated hospital, with cues said [facility], didn't know why she was in the hospital) Current Attention Level: Focused (easily distracted) Memory: Decreased short-term memory Following Commands: Follows one step commands with increased time, Follows one step commands inconsistently Safety/Judgement: Decreased awareness of deficits Awareness: Intellectual Problem Solving: Slow processing, Decreased initiation, Difficulty sequencing, Requires verbal cues, Requires tactile cues General Comments: pt awake but slow to repond, appears sleepy, difficulty focusing, requires tactile cues to initiate task and to provide step by step cues to complete task, ie transfer to [facility], impaired processing        General Comments General comments (skin integrity, edema, etc.): pt with some bruising on LEs, assisted to [facility] however pt unable to void. pt SpO2 >95% on RA    Exercises     Assessment/Plan    PT Assessment Patient needs continued PT services  PT Problem List Decreased strength;Decreased range of motion;Decreased activity tolerance;Decreased balance;Decreased mobility;Decreased coordination;Decreased cognition;Decreased knowledge of use of DME       PT Treatment Interventions DME instruction;Gait training;Functional mobility training;Therapeutic activities;Therapeutic exercise;Balance training;Neuromuscular re-education;Cognitive remediation    PT Goals (Current goals can be found in the Care Plan section)  Acute Rehab PT Goals Patient Stated Goal: unable to state PT  Goal Formulation: With family Time For Goal Achievement: 12/03/21 Potential to Achieve Goals: Fair    Frequency Min 3X/week     Co-evaluation               AM-PAC PT "6  Clicks" Mobility  Outcome Measure Help needed turning from your back to your side while in a flat bed without using bedrails?: A Lot Help needed moving from lying on your back to sitting on the side of a flat bed without using bedrails?: A Lot Help needed moving to and from a bed to a chair (including a wheelchair)?: A Lot Help needed standing up from a chair using your arms (e.g., wheelchair or bedside chair)?: A Lot Help needed to walk in hospital room?: A Lot Help needed climbing 3-5 steps with a railing? : Total 6 Click Score: 11    End of Session Equipment Utilized During Treatment: Gait belt Activity Tolerance: Patient limited by lethargy;Patient limited by fatigue Patient left: in chair;with call bell/phone within reach;with chair alarm set Nurse Communication: Mobility status (need for 2 person assist) PT Visit Diagnosis: Unsteadiness on feet (R26.81);Muscle weakness (generalized) (M62.81);Difficulty in walking, not elsewhere classified (R26.2)    Time: 0726-0805 PT Time Calculation (min) (ACUTE ONLY): 39 min   Charges:   PT Evaluation $PT Eval Moderate Complexity: 1 Mod PT Treatments $Therapeutic Activity: 23-37 mins        Kittie Plater, PT, DPT Acute Rehabilitation Services Secure chat preferred Office #: [phone]   Berline Lopes 11/19/2021, 9:15 AM

## 2021-11-19 NOTE — Evaluation (Signed)
Occupational Therapy Evaluation Patient Details Name: Melinda [name] MRN: [id number] DOB: 04/21/1934 Today's Date: 11/19/2021   History of Present Illness 87 year old female presenting to ED on 8/14 with a mechanical ground-level fall.  Patient complains of mild headache with some dizziness.  She has had some intermittent n/v. CT showing Small area of parenchymal hemorrhage in the left temporal lobe. PMH: HTN, R eye macular degeneration, cardiac history, bilat breast ca   Clinical Impression   PTA, pt was living alone at [place] and performed ADLs independently. Pt currently requiring Min A for UB ADLs, Mod A for LB ADLs, and Min A for sit<>stand with RW. Pt brushing her hair while standing at sink with Min Guard A and recliner behind for safety. Pt also completing peri care with Min A. Pt presenting with decreased balance, strength, activity tolerance, and cognition. Pt would benefit from further acute OT to facilitate safe dc. Recommend dc to SNF for further OT to optimize safety, independence with ADLs, and return to PLOF.       Recommendations for follow up therapy are one component of a multi-disciplinary discharge planning process, led by the attending physician.  Recommendations may be updated based on patient status, additional functional criteria and insurance authorization.   Follow Up Recommendations  Skilled nursing-short term rehab (<3 hours/day)    Assistance Recommended at Discharge Frequent or constant Supervision/Assistance  Patient can return home with the following      Functional Status Assessment  Patient has had a recent decline in their functional status and demonstrates the ability to make significant improvements in function in a reasonable and predictable amount of time.  Equipment Recommendations  Other (comment) (defer to next venue)    Recommendations for Other Services       Precautions / Restrictions Precautions Precautions:  Fall Restrictions Weight Bearing Restrictions: No      Mobility Bed Mobility Overal bed mobility: Needs Assistance Bed Mobility: Supine to Sit     Supine to sit: Mod assist, Max assist, HOB elevated     General bed mobility comments: In recliner upon arrival    Transfers Overall transfer level: Needs assistance Equipment used: Rolling walker (2 wheels) Transfers: Sit to/from Stand, Bed to chair/wheelchair/BSC Sit to Stand: Min assist     Step pivot transfers: Mod assist, Max assist     General transfer comment: Min A for power up into standing.      Balance Overall balance assessment: Needs assistance Sitting-balance support: Feet supported, No upper extremity supported Sitting balance-Leahy Scale: Fair     Standing balance support: Reliant on assistive device for balance, During functional activity, Bilateral upper extremity supported Standing balance-Leahy Scale: Poor Standing balance comment: dependent on external assist                           ADL either performed or assessed with clinical judgement   ADL Overall ADL's : Needs assistance/impaired Eating/Feeding: Set up;Sitting   Grooming: Brushing hair;Min guard;Standing Grooming Details (indicate cue type and reason): Pt brushing her hair while standing at sink with Min Guard A Upper Body Bathing: Minimal assistance;Sitting   Lower Body Bathing: Moderate assistance;Sit to/from stand   Upper Body Dressing : Minimal assistance;Sitting Upper Body Dressing Details (indicate cue type and reason): Changing gown with Min A Lower Body Dressing: Moderate assistance;Sit to/from stand   Toilet Transfer: Minimal assistance;Rolling walker (2 wheels) (sit<>stand from recliner)   Toileting- Clothing Manipulation and Hygiene: Min guard;Minimal  assistance;Sit to/from stand Toileting - Clothing Manipulation Details (indicate cue type and reason): Min guard A while pt performed anteiror peri care and then Min  A for making sure posterior peri area clean.     Functional mobility during ADLs: Minimal assistance;Rolling walker (2 wheels) (sit<>stand only) General ADL Comments: Rolling recliner to sink to perform peri care and brush hair. Pt requiring increased time throughout but able to engage and particiapte in ADLs     Vision Baseline Vision/History: 1 Wears glasses       Perception     Praxis      Pertinent Vitals/Pain Pain Assessment Pain Assessment: Faces Faces Pain Scale: Hurts even more Pain Location: neck (when asked upon PT arrival pt denied having pain, once pt began moving pt kept taking R hand and rubbing R side of her neck complaining of pain) Pain Descriptors / Indicators: Sore Pain Intervention(s): Monitored during session, Limited activity within patient's tolerance, Repositioned     Hand Dominance Right   Extremity/Trunk Assessment Upper Extremity Assessment Upper Extremity Assessment: Generalized weakness   Lower Extremity Assessment Lower Extremity Assessment: Generalized weakness   Cervical / Trunk Assessment Cervical / Trunk Assessment: Other exceptions Cervical / Trunk Exceptions: neck soreness   Communication Communication Communication: Expressive difficulties (delayed response time, soft spoken)   Cognition Arousal/Alertness: Awake/alert (but will fall asleep easily) Behavior During Therapy: Flat affect Overall Cognitive Status: Impaired/Different from baseline Area of Impairment: Orientation, Attention, Memory, Following commands, Safety/judgement, Awareness, Problem solving                 Orientation Level: Disoriented to (Able to report she is at [facility] hospital becasue she fell, the month august, and year 2023) Current Attention Level: Sustained (Sustaining attention to ADLs) Memory: Decreased short-term memory Following Commands: Follows one step commands with increased time, Follows one step commands inconsistently Safety/Judgement:  Decreased awareness of deficits Awareness: Intellectual Problem Solving: Slow processing, Decreased initiation, Difficulty sequencing, Requires verbal cues, Requires tactile cues General Comments: upon arrival, pt brushing her teeth while seated in recliner. Pt able to state where she is and why. Pt demonstrating slower processing and requiring increased time throughout; especially to initiate responce to conversation and ADLs.     General Comments       Exercises     Shoulder Instructions      Home Living Family/patient expects to be discharged to:: Private residence (lives at [place]) Living Arrangements: Spouse/significant other Available Help at Discharge: Family;Available 24 hours/day Type of Home: Apartment Home Access: Level entry     Home Layout: One level     Bathroom Shower/Tub: Tub/shower unit;Walk-in shower   Bathroom Toilet: Handicapped height     Home Equipment: Cane - single point;Hand held shower head   Additional Comments: lives at [facility] [place], has tub shower and small walk in shower, can't fit shower seat in there      Prior Functioning/Environment Prior Level of Function : Independent/Modified Independent             Mobility Comments: used [facility] in community, sometimes in home as well ADLs Comments: Performs ADLs and some IADLs including managing medication. Pt reports staff assists with meals and cleaning        OT Problem List: Decreased strength;Decreased activity tolerance;Decreased range of motion;Decreased knowledge of use of DME or AE;Decreased cognition;Decreased safety awareness;Decreased knowledge of precautions      OT Treatment/Interventions: Self-care/ADL training;Therapeutic exercise;DME and/or AE instruction;Energy conservation;Therapeutic activities;Balance training;Patient/family education  OT Goals(Current goals can be found in the care plan section) Acute Rehab OT Goals Patient Stated Goal: "Go back  to my home" OT Goal Formulation: With patient Time For Goal Achievement: 12/03/21 Potential to Achieve Goals: Good  OT Frequency: Min 2X/week    Co-evaluation              AM-PAC OT "6 Clicks" Daily Activity     Outcome Measure Help from another person eating meals?: A Little Help from another person taking care of personal grooming?: A Little Help from another person toileting, which includes using toliet, bedpan, or urinal?: A Little Help from another person bathing (including washing, rinsing, drying)?: A Lot Help from another person to put on and taking off regular upper body clothing?: A Little Help from another person to put on and taking off regular lower body clothing?: A Lot 6 Click Score: 16   End of Session Equipment Utilized During Treatment: Rolling walker (2 wheels) Nurse Communication: Mobility status  Activity Tolerance: Patient tolerated treatment well Patient left: in chair;with call bell/phone within reach;with chair alarm set  OT Visit Diagnosis: Unsteadiness on feet (R26.81);Other abnormalities of gait and mobility (R26.89);Muscle weakness (generalized) (M62.81)                Time: 4315-4008 OT Time Calculation (min): 27 min Charges:  OT General Charges $OT Visit: 1 Visit OT Evaluation $OT Eval Moderate Complexity: 1 Mod OT Treatments $Self Care/Home Management : 8-22 mins  Nassim Cosma MSOT, OTR/L Acute Rehab Office: [place] 11/19/2021, 1:30 PM

## 2021-11-19 NOTE — Progress Notes (Signed)
Brighter and much more appropriate today.  Oriented x3.  Speech reasonably fluent.  Motor and sensory function stable.  Denies headache.  Does note some right-sided neck discomfort.  Follow-up head CT scan demonstrates evolution of her intraventricular hemorrhage.  She has stable moderate ventriculomegaly but no evidence of significant intracranial hypertension.  Her hyponatremia is stable.  Her hypokalemia slowly improving with oral replacement.  Overall progressing about as well as could be hoped.  Continue efforts at therapy.  Work towards discharge to skilled nursing prior to discharge home.

## 2021-11-20 DIAGNOSIS — I615 Nontraumatic intracerebral hemorrhage, intraventricular: Secondary | ICD-10-CM | POA: Diagnosis not present

## 2021-11-20 LAB — BASIC METABOLIC PANEL
Anion gap: 8 (ref 5–15)
BUN: 6 mg/dL — ABNORMAL LOW (ref 8–23)
CO2: 28 mmol/L (ref 22–32)
Calcium: 8 mg/dL — ABNORMAL LOW (ref 8.9–10.3)
Chloride: 94 mmol/L — ABNORMAL LOW (ref 98–111)
Creatinine, Ser: 0.52 mg/dL (ref 0.44–1.00)
GFR, Estimated: >60 mL/min (ref >60)
Glucose, Bld: 116 mg/dL — ABNORMAL HIGH (ref 70–99)
Potassium: 3 mmol/L — ABNORMAL LOW (ref 3.5–5.1)
Sodium: 130 mmol/L — ABNORMAL LOW (ref 135–145)

## 2021-11-20 LAB — MAGNESIUM: Magnesium: 1.6 mg/dL — ABNORMAL LOW (ref 1.7–2.4)

## 2021-11-20 MED ORDER — POTASSIUM CHLORIDE CRYS ER 20 MEQ PO TBCR
40.0000 meq | EXTENDED_RELEASE_TABLET | Freq: Two times a day (BID) | ORAL | Status: AC
  Administered 2021-11-20: 40 meq via ORAL
  Filled 2021-11-20 (×2): qty 2

## 2021-11-20 MED ORDER — MAGNESIUM SULFATE 2 GM/50ML IV SOLN
2.0000 g | Freq: Once | INTRAVENOUS | Status: AC
  Administered 2021-11-20: 2 g via INTRAVENOUS
  Filled 2021-11-20: qty 50

## 2021-11-20 NOTE — Progress Notes (Signed)
Progress Note  Patient Name: Melinda [name] Date of Encounter: 11/20/2021  [facility] Cardiologist: Sanda Klein, MD   Subjective   Feels well with no CP or SOB. Did not know she had atrial fibrillation earlier today, no palpitations. Sitting up in bedside chair comfortably.  Inpatient Medications    Scheduled Meds:  amLODipine  5 mg Oral Daily   atorvastatin  20 mg Oral QHS   latanoprost  1 drop Both Eyes QHS   loratadine  10 mg Oral Daily   losartan  100 mg Oral Daily   metoprolol succinate  50 mg Oral Daily   multivitamin  1 tablet Oral Daily   senna-docusate  1 tablet Oral QHS   sertraline  25 mg Oral Daily   sodium chloride  1 g Oral BID WC   timolol  1 drop Right Eye Daily   Continuous Infusions:  PRN Meds: acetaminophen **OR** acetaminophen, albuterol, HYDROmorphone (DILAUDID) injection, labetalol, nitroGLYCERIN, ondansetron **OR** ondansetron (ZOFRAN) IV, mouth rinse   Vital Signs    Vitals:   11/19/21 2332 11/20/21 0412 11/20/21 0717 11/20/21 1015  BP: (!) 154/55 (!) 159/79 (!) 149/64 135/89  Pulse: 76 62 65 (!) 170  Resp: '18 18 18 ' (!) 23  Temp: (!) 100.5 F (38.1 C) 98.4 F (36.9 C) 98 F (36.7 C)   TempSrc: Oral Oral    SpO2: 97% 100% 100% 100%  Weight:      Height:        Intake/Output Summary (Last 24 hours) at 11/20/2021 1052 Last data filed at 11/20/2021 0500 Gross per 24 hour  Intake 1177.87 ml  Output 700 ml  Net 477.87 ml      11/15/2021    3:59 AM 11/11/2021    8:06 PM 08/19/2021    9:49 AM  Last 3 Weights  Weight (lbs) 156 lb 15.5 oz 150 lb 159 lb 3.2 oz  Weight (kg) 71.2 kg 68.04 kg 72.213 kg      Telemetry    Afib rate 170s, the SR - Personally Reviewed  ECG    A-fib RVR rate 168, possible atrial flutter with 2-1 conduction, challenging to determine at fast heart rate- Personally Reviewed  Physical Exam   GEN: No acute distress.   Neck: No JVD Cardiac: RRR, 2/6 systolic murmur   Respiratory: Clear to  auscultation bilaterally. GI: Soft, nontender, non-distended  MS: No edema; No deformity. Neuro:  A+O x 3 Psych: Normal affect   Labs    High Sensitivity Troponin:  No results for input(s): "TROPONINIHS" in the last 720 hours.   Chemistry Recent Labs  Lab 11/17/21 0603 11/18/21 0353 11/19/21 0759  NA 128* 131* 130*  K 2.7* 2.6* 2.9*  CL 92* 94* 92*  CO2 '26 25 27  ' GLUCOSE 119* 112* 113*  BUN 5* 6* 7*  CREATININE 0.44 0.47 0.54  CALCIUM 7.8* 8.0* 7.9*  MG  --  1.8  --   GFRNONAA >60 >60 >60  ANIONGAP '10 12 11    ' Lipids No results for input(s): "CHOL", "TRIG", "HDL", "LABVLDL", "LDLCALC", "CHOLHDL" in the last 168 hours.  HematologyNo results for input(s): "WBC", "RBC", "HGB", "HCT", "MCV", "MCH", "MCHC", "RDW", "PLT" in the last 168 hours. Thyroid No results for input(s): "TSH", "FREET4" in the last 168 hours.  BNPNo results for input(s): "BNP", "PROBNP" in the last 168 hours.  DDimer No results for input(s): "DDIMER" in the last 168 hours.   Radiology    CT HEAD WO CONTRAST (5MM)  Result  Date: 11/19/2021 CLINICAL DATA:  Hydrocephalus. Follow-up intraventricular hemorrhage. EXAM: CT HEAD WITHOUT CONTRAST TECHNIQUE: Contiguous axial images were obtained from the base of the skull through the vertex without intravenous contrast. RADIATION DOSE REDUCTION: This exam was performed according to the departmental dose-optimization program which includes automated exposure control, adjustment of the mA and/or kV according to patient size and/or use of iterative reconstruction technique. COMPARISON:  11/16/2021 FINDINGS: Brain: Previously seen intraparenchymal hemorrhage in the left temporoparietal junction is no longer visible. Intraventricular blood is becoming less dense. No additional bleeding. Blood products primarily present within the left lateral ventricle. No change in ventricular size. Vascular: There is atherosclerotic calcification of the major vessels at the base of the brain.  Skull: No skull fracture. Sinuses/Orbits: Clear/normal Other: None IMPRESSION: No worsening or new finding. Decreasing density of the left temporoparietal junction region hemorrhage. Decreasing density of the intraventricular blood products. No change in ventricular size. Electronically Signed   By: Nelson Chimes M.D.   On: 11/19/2021 10:41    Cardiac Studies   Echocardiogram 11/12/2021  1. Left ventricular ejection fraction, by estimation, is 55 to 60%. The  left ventricle has normal function. The left ventricle has no regional  wall motion abnormalities. There is mild concentric left ventricular  hypertrophy. Left ventricular diastolic  parameters are consistent with Grade I diastolic dysfunction (impaired  relaxation).   2. Right ventricular systolic function is normal. The right ventricular  size is normal. There is normal pulmonary artery systolic pressure. The  estimated right ventricular systolic pressure is 94.7 mmHg.   3. The mitral valve is grossly normal. Trivial mitral valve  regurgitation. No evidence of mitral stenosis.   4. The aortic valve is tricuspid. There is moderate calcification of the  aortic valve. There is moderate thickening of the aortic valve. Aortic  valve regurgitation is mild to moderate. Mild aortic valve stenosis.  Aortic valve area, by VTI measures 1.57  cm. Aortic valve mean gradient measures 12.0 mmHg. Aortic valve Vmax  measures 2.39 m/s.   5. The inferior vena cava is normal in size with greater than 50%  respiratory variability, suggesting right atrial pressure of 3 mmHg.   Comparison(s): No significant change from prior study.   Patient Profile     87 y.o. female  with moderate nonobstructive CAD, paroxysmal atrial fibrillation, mild aortic valve stenosis/mild-moderate aortic valve insufficiency, HTN, frequent falls, presents with disorientation following diarrheal illness leading to a fall and intraventricular hemorrhage, moderate  hyponatremia.  Assessment & Plan    Intraventricular hemorrhage: -Per neurosurgery, patient denies headache.  Status post intraventricular hemorrhage with moderate associated hydrocephalus, but felt to be making progress, plan for hospital discharge in the next 1 to 2 days.  Parox AFib:  - Patient converted to Afib with RVR earlier this AM, HR elevated to 170s. EKG showed 2:1 atrial flutter with a HR of 168 BPM  - Patient already on metoprolol succinate 50 mg daily  - BP stable at 136/64  - Risk of anticoagulation exceeds risk of embolic stroke.  We will keep her off anticoagulants for at least the foreseeable future.  May consider referral for [facility], but benefits less at her advanced age -She has since converted to sinus rhythm again and feels well.  She had no symptoms during rapid atrial fibrillation earlier this morning.  No changes to medications required at this time. -She has been hypokalemic despite repletion, we will recheck be met and magnesium today and replete as needed.  This may have  contributed to episodic A-fib earlier today.  Mild-moderate AI  Mild AS - Noted on echo from 11/12/2021 - Follow as an outpatient   CAD - Patient is asymptomatic.  - CAD was moderate by previous cardiac catheterization in 2015 - Plan to start aspirin once she recovers from the intracranial hemorrhage.  Falls - Note that implantable loop recorder has not shown any evidence of arrhythmia at the time of her falls.  HTN - Now back on home BP medications  - Recommended target is systolic blood pressure in the 140-150 mmHg range, rather than "perfect" blood pressure control, due to her history of frequent falls.      For questions or updates, please contact [place] Please consult [email or web address] for contact info under        Signed, Elouise Munroe, MD 11/20/2021, 10:52 AM

## 2021-11-20 NOTE — Progress Notes (Signed)
Looks good today.  Interested in going home.  Denies headache.  Tolerating diet better.  No other complaints.  Status post intraventricular hemorrhage with moderate associated hydrocephalus.  Overall patient making progress.  Continue efforts at therapies.  Working towards discharge either into assisted living or higher skill level temporarily.

## 2021-11-20 NOTE — Progress Notes (Addendum)
RN received call from central telemetry monitoring that patient's heart rate in 170's.  Patient was being transferred to/from the [facility] to the chair.   RN obtained patient's scheduled medications to administer and reassessed patient heart rate status post mobility.  Heart rate sustaining in the 170's, no PRN medications available to administer.  Patient alert and oriented and asymptomatic. Patient's blood pressure 135/89 (101).  RN paged MD Pool, new orders for EKG received.   1035 - EKG completed.  MD Pool notified EKG in results for his review.

## 2021-11-21 DIAGNOSIS — K219 Gastro-esophageal reflux disease without esophagitis: Secondary | ICD-10-CM | POA: Diagnosis not present

## 2021-11-21 DIAGNOSIS — I10 Essential (primary) hypertension: Secondary | ICD-10-CM | POA: Diagnosis not present

## 2021-11-21 DIAGNOSIS — I2510 Atherosclerotic heart disease of native coronary artery without angina pectoris: Secondary | ICD-10-CM | POA: Diagnosis not present

## 2021-11-21 DIAGNOSIS — Z7689 Persons encountering health services in other specified circumstances: Secondary | ICD-10-CM | POA: Diagnosis not present

## 2021-11-21 DIAGNOSIS — R630 Anorexia: Secondary | ICD-10-CM | POA: Diagnosis not present

## 2021-11-21 DIAGNOSIS — R2681 Unsteadiness on feet: Secondary | ICD-10-CM | POA: Diagnosis not present

## 2021-11-21 DIAGNOSIS — R519 Headache, unspecified: Secondary | ICD-10-CM | POA: Diagnosis not present

## 2021-11-21 DIAGNOSIS — G501 Atypical facial pain: Secondary | ICD-10-CM | POA: Diagnosis not present

## 2021-11-21 DIAGNOSIS — I615 Nontraumatic intracerebral hemorrhage, intraventricular: Secondary | ICD-10-CM | POA: Diagnosis not present

## 2021-11-21 DIAGNOSIS — Z7901 Long term (current) use of anticoagulants: Secondary | ICD-10-CM | POA: Diagnosis not present

## 2021-11-21 DIAGNOSIS — D649 Anemia, unspecified: Secondary | ICD-10-CM | POA: Diagnosis not present

## 2021-11-21 DIAGNOSIS — M545 Low back pain, unspecified: Secondary | ICD-10-CM | POA: Diagnosis not present

## 2021-11-21 DIAGNOSIS — R200 Anesthesia of skin: Secondary | ICD-10-CM | POA: Diagnosis not present

## 2021-11-21 DIAGNOSIS — Z9181 History of falling: Secondary | ICD-10-CM | POA: Diagnosis not present

## 2021-11-21 DIAGNOSIS — I480 Paroxysmal atrial fibrillation: Secondary | ICD-10-CM | POA: Diagnosis not present

## 2021-11-21 DIAGNOSIS — I351 Nonrheumatic aortic (valve) insufficiency: Secondary | ICD-10-CM | POA: Diagnosis not present

## 2021-11-21 DIAGNOSIS — M7601 Gluteal tendinitis, right hip: Secondary | ICD-10-CM | POA: Diagnosis not present

## 2021-11-21 DIAGNOSIS — S32030D Wedge compression fracture of third lumbar vertebra, subsequent encounter for fracture with routine healing: Secondary | ICD-10-CM | POA: Diagnosis not present

## 2021-11-21 DIAGNOSIS — M25561 Pain in right knee: Secondary | ICD-10-CM | POA: Diagnosis not present

## 2021-11-21 DIAGNOSIS — M6281 Muscle weakness (generalized): Secondary | ICD-10-CM | POA: Diagnosis not present

## 2021-11-21 DIAGNOSIS — W19XXXA Unspecified fall, initial encounter: Secondary | ICD-10-CM | POA: Diagnosis not present

## 2021-11-21 DIAGNOSIS — R41841 Cognitive communication deficit: Secondary | ICD-10-CM | POA: Diagnosis not present

## 2021-11-21 DIAGNOSIS — F330 Major depressive disorder, recurrent, mild: Secondary | ICD-10-CM | POA: Diagnosis not present

## 2021-11-21 DIAGNOSIS — R2689 Other abnormalities of gait and mobility: Secondary | ICD-10-CM | POA: Diagnosis not present

## 2021-11-21 DIAGNOSIS — N181 Chronic kidney disease, stage 1: Secondary | ICD-10-CM | POA: Diagnosis not present

## 2021-11-21 DIAGNOSIS — F339 Major depressive disorder, recurrent, unspecified: Secondary | ICD-10-CM | POA: Diagnosis not present

## 2021-11-21 DIAGNOSIS — K5900 Constipation, unspecified: Secondary | ICD-10-CM | POA: Diagnosis not present

## 2021-11-21 DIAGNOSIS — E7800 Pure hypercholesterolemia, unspecified: Secondary | ICD-10-CM | POA: Diagnosis not present

## 2021-11-21 DIAGNOSIS — R55 Syncope and collapse: Secondary | ICD-10-CM | POA: Diagnosis not present

## 2021-11-21 DIAGNOSIS — I471 Supraventricular tachycardia: Secondary | ICD-10-CM | POA: Diagnosis not present

## 2021-11-21 DIAGNOSIS — E871 Hypo-osmolality and hyponatremia: Secondary | ICD-10-CM | POA: Diagnosis not present

## 2021-11-21 DIAGNOSIS — S06360A Traumatic hemorrhage of cerebrum, unspecified, without loss of consciousness, initial encounter: Secondary | ICD-10-CM | POA: Diagnosis not present

## 2021-11-21 DIAGNOSIS — G919 Hydrocephalus, unspecified: Secondary | ICD-10-CM | POA: Diagnosis not present

## 2021-11-21 DIAGNOSIS — N390 Urinary tract infection, site not specified: Secondary | ICD-10-CM | POA: Diagnosis not present

## 2021-11-21 DIAGNOSIS — J301 Allergic rhinitis due to pollen: Secondary | ICD-10-CM | POA: Diagnosis not present

## 2021-11-21 DIAGNOSIS — Z7401 Bed confinement status: Secondary | ICD-10-CM | POA: Diagnosis not present

## 2021-11-21 DIAGNOSIS — R262 Difficulty in walking, not elsewhere classified: Secondary | ICD-10-CM | POA: Diagnosis not present

## 2021-11-21 DIAGNOSIS — I5181 Takotsubo syndrome: Secondary | ICD-10-CM | POA: Diagnosis not present

## 2021-11-21 DIAGNOSIS — R6884 Jaw pain: Secondary | ICD-10-CM | POA: Diagnosis not present

## 2021-11-21 DIAGNOSIS — M1711 Unilateral primary osteoarthritis, right knee: Secondary | ICD-10-CM | POA: Diagnosis not present

## 2021-11-21 DIAGNOSIS — I959 Hypotension, unspecified: Secondary | ICD-10-CM | POA: Diagnosis not present

## 2021-11-21 DIAGNOSIS — H353212 Exudative age-related macular degeneration, right eye, with inactive choroidal neovascularization: Secondary | ICD-10-CM | POA: Diagnosis not present

## 2021-11-21 DIAGNOSIS — F39 Unspecified mood [affective] disorder: Secondary | ICD-10-CM | POA: Diagnosis not present

## 2021-11-21 MED ORDER — POTASSIUM CHLORIDE CRYS ER 20 MEQ PO TBCR
20.0000 meq | EXTENDED_RELEASE_TABLET | Freq: Once | ORAL | Status: AC
  Administered 2021-11-21: 20 meq via ORAL
  Filled 2021-11-21: qty 1

## 2021-11-21 MED ORDER — POTASSIUM CHLORIDE CRYS ER 20 MEQ PO TBCR
20.0000 meq | EXTENDED_RELEASE_TABLET | Freq: Two times a day (BID) | ORAL | Status: DC
  Administered 2021-11-21: 20 meq via ORAL
  Filled 2021-11-21: qty 1

## 2021-11-21 MED ORDER — MAGNESIUM SULFATE 2 GM/50ML IV SOLN
2.0000 g | Freq: Once | INTRAVENOUS | Status: AC
  Administered 2021-11-21: 2 g via INTRAVENOUS
  Filled 2021-11-21: qty 50

## 2021-11-21 NOTE — Discharge Summary (Addendum)
Physician Discharge Summary     Providing Compassionate, Quality Care - Together   Patient ID: Melinda [name] MRN: [id number] DOB/AGE: 1935/03/18 87 y.o.  Admit date: 11/11/2021 Discharge date: 11/21/2021  Admission Diagnoses: intraventricular hemorrhage  Discharge Diagnoses:  Principal Problem:   IVH (intraventricular hemorrhage) [facility])   Discharged Condition: good  Hospital Course: Patient had a ground level fall on Eliquis on 11/11/2021. She sustained an intraventricular hemorrhage. A reversal agent was given in the ED. Patient was admitted for observation. She had mild confusion and restlessness, but her condition slowly improved. She ultimately did not require any surgical intervention. She has worked with therapies, who feel the patient would benefit from further rehabilitation at a skilled nursing facility. She was living at [facility] and will discharge to the SNF at [facility]. The patient will remain off of Eliquis permanently as the risks of restarting this medication far outweigh any benefit.  Consults: rehabilitation medicine  Significant Diagnostic Studies: radiology: CT HEAD WO CONTRAST (5MM)  Result Date: 11/19/2021 CLINICAL DATA:  Hydrocephalus. Follow-up intraventricular hemorrhage. EXAM: CT HEAD WITHOUT CONTRAST TECHNIQUE: Contiguous axial images were obtained from the base of the skull through the vertex without intravenous contrast. RADIATION DOSE REDUCTION: This exam was performed according to the departmental dose-optimization program which includes automated exposure control, adjustment of the mA and/or kV according to patient size and/or use of iterative reconstruction technique. COMPARISON:  11/16/2021 FINDINGS: Brain: Previously seen intraparenchymal hemorrhage in the left temporoparietal junction is no longer visible. Intraventricular blood is becoming less dense. No additional bleeding. Blood products primarily present within the left  lateral ventricle. No change in ventricular size. Vascular: There is atherosclerotic calcification of the major vessels at the base of the brain. Skull: No skull fracture. Sinuses/Orbits: Clear/normal Other: None IMPRESSION: No worsening or new finding. Decreasing density of the left temporoparietal junction region hemorrhage. Decreasing density of the intraventricular blood products. No change in ventricular size. Electronically Signed   By: Nelson Chimes M.D.   On: 11/19/2021 10:41   CT HEAD WO CONTRAST (5MM)  Result Date: 11/16/2021 CLINICAL DATA:  Mental status change. Follow-up exam. Traumatic intracranial hemorrhage. EXAM: CT HEAD WITHOUT CONTRAST TECHNIQUE: Contiguous axial images were obtained from the base of the skull through the vertex without intravenous contrast. RADIATION DOSE REDUCTION: This exam was performed according to the departmental dose-optimization program which includes automated exposure control, adjustment of the mA and/or kV according to patient size and/or use of iterative reconstruction technique. COMPARISON:  11/13/2021, 11/12/2021 and 11/11/2021. FINDINGS: Brain: Small focus of parenchymal hemorrhage, left parietal lobe white matter adjacent to the left lateral ventricle atrium, unchanged. Interventricular hemorrhage, all ventricles, most prominently in the left lateral. Third ventricle and fourth ventricle hemorrhage has decreased from the previous day's exam. No new areas of parenchymal/intracranial hemorrhage. Mild enlargement of the ventricles, compared to a CT dated 05/07/2021. No parenchymal mass. No midline shift. No evidence of an ischemic infarct. Vascular: No hyperdense vessel or unexpected calcification. Skull: Normal. Negative for fracture or focal lesion. Sinuses/Orbits: Globes and orbits are unremarkable. Sinuses are clear. Other: None. IMPRESSION: 1. No new intracranial hemorrhage. No evidence of an ischemic infarct. 2. Small focus of left parietal parenchymal image  is unchanged. Decreased third and fourth intraventricular hemorrhage. Stable left lateral intraventricular hemorrhage and dependent hemorrhage in the right lateral ventricle. 3. Mild hydrocephalus, ventricles increased in size when compared to the CT dated 05/07/2021. Electronically Signed   By: Lajean Manes M.D.   On: 11/16/2021 14:23  CT HEAD WO CONTRAST (5MM)  Result Date: 11/13/2021 CLINICAL DATA:  Follow-up hydrocephalus. Traumatic intracranial hemorrhage EXAM: CT HEAD WITHOUT CONTRAST TECHNIQUE: Contiguous axial images were obtained from the base of the skull through the vertex without intravenous contrast. RADIATION DOSE REDUCTION: This exam was performed according to the departmental dose-optimization program which includes automated exposure control, adjustment of the mA and/or kV according to patient size and/or use of iterative reconstruction technique. COMPARISON:  CT head 11/12/2021 and 11/11/2021 FINDINGS: Brain: Intraventricular hemorrhage is stable. Hemorrhage is in the lateral ventricles left greater than right extending into the third and fourth ventricles. Hydrocephalus shows mild progression compared to prior studies. 1 cm area of parenchymal hemorrhage in the left temporoparietal white matter is unchanged. No new area of hemorrhage. Negative for acute infarct or mass. Vascular: Negative for hyperdense vessel Skull: Negative Sinuses/Orbits: Paranasal sinuses clear. Bilateral cataract extraction Other: None IMPRESSION: Progressive hydrocephalus which is now moderate. Intraventricular hemorrhage is stable. Small area of parenchymal hemorrhage in the left temporoparietal lobe is stable. No acute ischemic infarct. Electronically Signed   By: Franchot Gallo M.D.   On: 11/13/2021 13:17   ECHOCARDIOGRAM COMPLETE  Result Date: 11/12/2021    ECHOCARDIOGRAM REPORT   Patient Name:   Melinda [name] Date of Exam: 11/12/2021 Medical Rec #:  [id number]           Height:       63.0 in Accession #:     [id number]          Weight:       150.0 lb Date of Birth:  10-26-1934          BSA:          103.711 m Patient Age:    69 years            BP:           142/61 mmHg Patient Gender: F                   HR:           78 bpm. Exam Location:  Inpatient Procedure: 2D Echo, Cardiac Doppler, Color Doppler and 3D Echo Indications:    Aortic stenosis  History:        Patient has prior history of Echocardiogram examinations, most                 recent 03/04/2019. Previous Myocardial Infarction; Risk                 Factors:Hypertension and Dyslipidemia.  Sonographer:    Maudry Mayhew MHA, RDMS, RVT, RDCS Referring Phys: [id number] [facility]  Sonographer Comments: Image acquisition challenging due to uncooperative patient. IMPRESSIONS  1. Left ventricular ejection fraction, by estimation, is 55 to 60%. The left ventricle has normal function. The left ventricle has no regional wall motion abnormalities. There is mild concentric left ventricular hypertrophy. Left ventricular diastolic parameters are consistent with Grade I diastolic dysfunction (impaired relaxation).  2. Right ventricular systolic function is normal. The right ventricular size is normal. There is normal pulmonary artery systolic pressure. The estimated right ventricular systolic pressure is 35.0 mmHg.  3. The mitral valve is grossly normal. Trivial mitral valve regurgitation. No evidence of mitral stenosis.  4. The aortic valve is tricuspid. There is moderate calcification of the aortic valve. There is moderate thickening of the aortic valve. Aortic valve regurgitation is mild to moderate. Mild aortic valve stenosis. Aortic valve area, by VTI measures 1.57  cm. Aortic valve mean gradient measures 12.0 mmHg. Aortic valve Vmax measures 2.39 m/s.  5. The inferior vena cava is normal in size with greater than 50% respiratory variability, suggesting right atrial pressure of 3 mmHg. Comparison(s): No significant change from prior study. FINDINGS  Left  Ventricle: Left ventricular ejection fraction, by estimation, is 55 to 60%. The left ventricle has normal function. The left ventricle has no regional wall motion abnormalities. The left ventricular internal cavity size was normal in size. There is  mild concentric left ventricular hypertrophy. Left ventricular diastolic parameters are consistent with Grade I diastolic dysfunction (impaired relaxation). Right Ventricle: The right ventricular size is normal. No increase in right ventricular wall thickness. Right ventricular systolic function is normal. There is normal pulmonary artery systolic pressure. The tricuspid regurgitant velocity is 1.57 m/s, and  with an assumed right atrial pressure of 3 mmHg, the estimated right ventricular systolic pressure is 32.9 mmHg. Left Atrium: Left atrial size was normal in size. Right Atrium: Right atrial size was normal in size. Pericardium: There is no evidence of pericardial effusion. Mitral Valve: The mitral valve is grossly normal. Trivial mitral valve regurgitation. No evidence of mitral valve stenosis. MV peak gradient, 8.5 mmHg. The mean mitral valve gradient is 4.0 mmHg. Tricuspid Valve: The tricuspid valve is grossly normal. Tricuspid valve regurgitation is trivial. No evidence of tricuspid stenosis. Aortic Valve: The aortic valve is tricuspid. There is moderate calcification of the aortic valve. There is moderate thickening of the aortic valve. Aortic valve regurgitation is mild to moderate. Aortic regurgitation PHT measures 476 msec. Mild aortic stenosis is present. Aortic valve mean gradient measures 12.0 mmHg. Aortic valve peak gradient measures 22.8 mmHg. Aortic valve area, by VTI measures 1.57 cm. Pulmonic Valve: The pulmonic valve was grossly normal. Pulmonic valve regurgitation is trivial. No evidence of pulmonic stenosis. Aorta: The aortic root is normal in size and structure. Venous: The inferior vena cava is normal in size with greater than 50% respiratory  variability, suggesting right atrial pressure of 3 mmHg. IAS/Shunts: The atrial septum is grossly normal.  LEFT VENTRICLE PLAX 2D LVIDd:         4.40 cm   Diastology LVIDs:         3.30 cm   LV e' medial:    3.70 cm/s LV PW:         1.10 cm   LV E/e' medial:  13.2 LV IVS:        1.20 cm   LV e' lateral:   5.55 cm/s LVOT diam:     2.20 cm   LV E/e' lateral: 8.8 LV SV:         70 LV SV Index:   41 LVOT Area:     3.80 cm                           3D Volume EF:                          3D EF:        54 %                          LV EDV:       97 ml                          LV  ESV:       44 ml                          LV SV:        53 ml RIGHT VENTRICLE TAPSE (M-mode): 1.9 cm LEFT ATRIUM             Index        RIGHT ATRIUM           Index LA diam:        3.20 cm 1.87 cm/m   RA Area:     11.00 cm LA Vol (A2C):   38.9 ml 22.73 ml/m  RA Volume:   22.90 ml  13.38 ml/m LA Vol (A4C):   37.1 ml 21.68 ml/m LA Biplane Vol: 39.2 ml 22.91 ml/m  AORTIC VALVE AV Area (Vmax):    1.26 cm AV Area (Vmean):   1.31 cm AV Area (VTI):     1.57 cm AV Vmax:           238.67 cm/s AV Vmean:          164.667 cm/s AV VTI:            0.445 m AV Peak Grad:      22.8 mmHg AV Mean Grad:      12.0 mmHg LVOT Vmax:         79.00 cm/s LVOT Vmean:        56.900 cm/s LVOT VTI:          0.184 m LVOT/AV VTI ratio: 0.41 AI PHT:            476 msec  AORTA Ao Root diam: 3.45 cm MITRAL VALVE               TRICUSPID VALVE MV Area (PHT): 3.89 cm    TR Peak grad:   9.9 mmHg MV Area VTI:   1.99 cm    TR Vmax:        157.00 cm/s MV Peak grad:  8.5 mmHg MV Mean grad:  4.0 mmHg    SHUNTS MV Vmax:       1.46 m/s    Systemic VTI:  0.18 m MV Vmean:      92.5 cm/s   Systemic Diam: 2.20 cm MV Decel Time: 195 msec MV E velocity: 49.00 cm/s MV A velocity: 83.90 cm/s MV E/A ratio:  0.58 Eleonore Chiquito MD Electronically signed by Eleonore Chiquito MD Signature Date/Time: 11/12/2021/3:51:08 PM    Final    CT HEAD WO CONTRAST  Result Date: 11/12/2021 CLINICAL DATA:   Traumatic brain injury follow-up. EXAM: CT HEAD WITHOUT CONTRAST TECHNIQUE: Contiguous axial images were obtained from the base of the skull through the vertex without intravenous contrast. RADIATION DOSE REDUCTION: This exam was performed according to the departmental dose-optimization program which includes automated exposure control, adjustment of the mA and/or kV according to patient size and/or use of iterative reconstruction technique. COMPARISON:  CT head from yesterday. FINDINGS: Brain: Small parenchymal hemorrhage in the left temporal lobe is unchanged. No significant interval change in amount of intraventricular hemorrhage involving the left-greater-than-right lateral ventricles, third ventricle, and fourth ventricle. No new area of hemorrhage. No midline shift. No evidence of acute infarction, hydrocephalus, or extra-axial collection. Vascular: Calcified atherosclerosis at the skull base. No hyperdense vessel. Skull: Normal. Negative for fracture or focal lesion. Sinuses/Orbits: No acute finding. Other: None. IMPRESSION: 1. Unchanged small parenchymal hemorrhage in the left temporal lobe.  Unchanged prominent intraventricular hemorrhage. Electronically Signed   By: Titus Dubin M.D.   On: 11/12/2021 12:57   CT T-SPINE NO CHARGE  Result Date: 11/12/2021 CLINICAL DATA:  Fall, polytrauma EXAM: CT THORACIC AND LUMBAR SPINE WITHOUT CONTRAST TECHNIQUE: Multidetector CT imaging of the thoracic and lumbar spine was performed without contrast. Multiplanar CT image reconstructions were also generated. RADIATION DOSE REDUCTION: This exam was performed according to the departmental dose-optimization program which includes automated exposure control, adjustment of the mA and/or kV according to patient size and/or use of iterative reconstruction technique. COMPARISON:  No prior dedicated thoracic or lumbar spine CT. Correlation is made with 08/06/2021 CT abdomen pelvis FINDINGS: CT THORACIC SPINE FINDINGS  Alignment: No listhesis. Vertebrae: No acute fracture or suspicious osseous lesion. Paraspinal and other soft tissues: Please see same-day CT chest abdomen pelvis. Disc levels: Multilevel degenerative changes without high-grade spinal canal stenosis or neural foraminal narrowing. CT LUMBAR SPINE FINDINGS Segmentation: 5 lumbar type vertebrae. Alignment: Trace anterolisthesis L4 on L5. Trace retrolisthesis of L1 on L2. Vertebrae: Redemonstrated compression deformity of L2, which appears unchanged compared to 08/06/2001. 3 mm retropulsion of the posterosuperior cortex. Approximately 65% vertebral body height loss centrally. Additional increased density and degenerative changes at the anterior superior aspect of L1 are also unchanged compared to 08/06/2021. No acute fracture or suspicious osseous lesion. Paraspinal and other soft tissues: Please see same-day CT chest abdomen pelvis. Disc levels: No high-grade spinal canal stenosis. Moderate bilateral neural foraminal narrowing at L1-L2 IMPRESSION: CT THORACIC SPINE IMPRESSION No acute fracture or traumatic listhesis. CT LUMBAR SPINE IMPRESSION No acute fracture or traumatic listhesis. Redemonstrated chronic compression deformity of L2, which appears unchanged compared to 08/06/2021. For soft tissue findings, please see same-day CT chest abdomen pelvis. Electronically Signed   By: Merilyn Baba M.D.   On: 11/12/2021 02:10   CT L-SPINE NO CHARGE  Result Date: 11/12/2021 CLINICAL DATA:  Fall, polytrauma EXAM: CT THORACIC AND LUMBAR SPINE WITHOUT CONTRAST TECHNIQUE: Multidetector CT imaging of the thoracic and lumbar spine was performed without contrast. Multiplanar CT image reconstructions were also generated. RADIATION DOSE REDUCTION: This exam was performed according to the departmental dose-optimization program which includes automated exposure control, adjustment of the mA and/or kV according to patient size and/or use of iterative reconstruction technique.  COMPARISON:  No prior dedicated thoracic or lumbar spine CT. Correlation is made with 08/06/2021 CT abdomen pelvis FINDINGS: CT THORACIC SPINE FINDINGS Alignment: No listhesis. Vertebrae: No acute fracture or suspicious osseous lesion. Paraspinal and other soft tissues: Please see same-day CT chest abdomen pelvis. Disc levels: Multilevel degenerative changes without high-grade spinal canal stenosis or neural foraminal narrowing. CT LUMBAR SPINE FINDINGS Segmentation: 5 lumbar type vertebrae. Alignment: Trace anterolisthesis L4 on L5. Trace retrolisthesis of L1 on L2. Vertebrae: Redemonstrated compression deformity of L2, which appears unchanged compared to 08/06/2001. 3 mm retropulsion of the posterosuperior cortex. Approximately 65% vertebral body height loss centrally. Additional increased density and degenerative changes at the anterior superior aspect of L1 are also unchanged compared to 08/06/2021. No acute fracture or suspicious osseous lesion. Paraspinal and other soft tissues: Please see same-day CT chest abdomen pelvis. Disc levels: No high-grade spinal canal stenosis. Moderate bilateral neural foraminal narrowing at L1-L2 IMPRESSION: CT THORACIC SPINE IMPRESSION No acute fracture or traumatic listhesis. CT LUMBAR SPINE IMPRESSION No acute fracture or traumatic listhesis. Redemonstrated chronic compression deformity of L2, which appears unchanged compared to 08/06/2021. For soft tissue findings, please see same-day CT chest abdomen pelvis. Electronically Signed  By: Merilyn Baba M.D.   On: 11/12/2021 02:10   CT ANGIO HEAD W OR WO CONTRAST  Result Date: 11/12/2021 CLINICAL DATA:  Intraparenchymal, intraventricular, and subarachnoid hemorrhage. Evaluate for aneurysm. EXAM: CT ANGIOGRAPHY HEAD TECHNIQUE: Multidetector CT imaging of the head was performed using the standard protocol during bolus administration of intravenous contrast. Multiplanar CT image reconstructions and MIPs were obtained to evaluate  the vascular anatomy. RADIATION DOSE REDUCTION: This exam was performed according to the departmental dose-optimization program which includes automated exposure control, adjustment of the mA and/or kV according to patient size and/or use of iterative reconstruction technique. CONTRAST:  75m OMNIPAQUE IOHEXOL 350 MG/ML SOLN COMPARISON:  01/17/2020 CTA head, correlation is also made with 11/11/2021 CT head. FINDINGS: CT HEAD FINDINGS For noncontrast findings, please see 11/11/2021 CT head. CTA HEAD FINDINGS Anterior circulation: Both internal carotid arteries are patent to the termini, with mild stenosis in the bilateral cavernous and proximal supraclinoid ICAs. A1 segments patent. Normal anterior communicating artery. Anterior cerebral arteries are patent to their distal aspects. No M1 stenosis or occlusion. MCA branches perfused and symmetric. Posterior circulation: Vertebral arteries patent to the vertebrobasilar junction with mild stenosis in the right-greater-than-left V4 posterior inferior cerebellar arteries patent proximally. Basilar patent to its distal aspect. Superior cerebellar arteries patent proximally. Patent P1 segments. PCAs perfused to their distal aspects without stenosis. The bilateral posterior communicating arteries are patent. Venous sinuses: As permitted by contrast timing, patent. Anatomic variants: None significant. Review of the MIP images confirms the above findings No evidence of active extravasation of contrast into the parenchymal or ventricular hemorrhages. IMPRESSION: 1. No intracranial large vessel occlusion with only mild stenosis in the bilateral intracranial ICAs and vertebral arteries. 2.  No evidence of active extravasation.  No aneurysm. Electronically Signed   By: AMerilyn BabaM.D.   On: 11/12/2021 01:51   CT CHEST ABDOMEN PELVIS W CONTRAST  Result Date: 11/12/2021 CLINICAL DATA:  Polytrauma, blunt.  Fall. EXAM: CT CHEST, ABDOMEN, AND PELVIS WITH CONTRAST TECHNIQUE:  Multidetector CT imaging of the chest, abdomen and pelvis was performed following the standard protocol during bolus administration of intravenous contrast. RADIATION DOSE REDUCTION: This exam was performed according to the departmental dose-optimization program which includes automated exposure control, adjustment of the mA and/or kV according to patient size and/or use of iterative reconstruction technique. CONTRAST:  841mOMNIPAQUE IOHEXOL 350 MG/ML SOLN COMPARISON:  08/06/2021. FINDINGS: CT CHEST FINDINGS Cardiovascular: The heart is enlarged and there is no pericardial effusion. Scattered coronary artery calcifications are noted. There is atherosclerotic calcification of the aorta without evidence of aneurysm. Pulmonary trunk is normal in caliber. Mediastinum/Nodes: No mediastinal, hilar, or axillary lymphadenopathy. The thyroid gland, trachea, and esophagus are within normal limits. Small hiatal hernia. Lungs/Pleura: Mild atelectasis or scarring bilaterally. No effusion or pneumothorax. There is a nodule in the left lower lobe measuring 4 mm, axial image 73, unchanged. Musculoskeletal: Degenerative changes are present in the thoracic spine. No acute osseous abnormality is seen. A tubular structure is present in the left breast with calcification and is likely chronic. A presumed loop recorder device is also noted in the anterior chest wall on the left. CT ABDOMEN PELVIS FINDINGS Hepatobiliary: Hypodensities are present in the liver, the largest measuring 2.1 cm is cystic in attenuation. No hepatic injury or perihepatic hematoma. No biliary ductal dilatation. The gallbladder is without stones. Pancreas: Unremarkable. No pancreatic ductal dilatation or surrounding inflammatory changes. Spleen: No splenic injury or perisplenic hematoma. Adrenals/Urinary Tract: No adrenal nodule or  mass. The kidneys enhance symmetrically. Subcentimeter hypodensities are present in the kidneys bilaterally which are too small to  further characterize. No renal calculus or hydronephrosis. The bladder is unremarkable. Stomach/Bowel: Stomach is within normal limits. Appendix is not seen. No evidence of bowel wall thickening, distention, or inflammatory changes. Multiple scattered diverticula are present along the colon without evidence of diverticulitis. Vascular/Lymphatic: Aortic atherosclerosis. No enlarged abdominal or pelvic lymph nodes. Reproductive: Status post hysterectomy. No adnexal masses. Other: No abdominopelvic ascites. Musculoskeletal: Degenerative changes are present in the lumbar spine. There is a compression deformity at L2 which is unchanged from the previous exam. An old healed fracture of the distal sacrum is noted. No acute fracture is seen. IMPRESSION: 1. No evidence of solid organ injury or acute fracture. 2. Stable old compression deformity at L2 and healed fracture of the distal sacrum. 3. Cardiomegaly with coronary artery calcifications. 4. Left lower lobe 4 mm pulmonary nodule, unchanged from the prior exam. 5. Diverticulosis without diverticulitis. 6. Aortic atherosclerosis. 7. Remaining incidental findings as described above. Electronically Signed   By: Brett Fairy M.D.   On: 11/12/2021 01:49   CT HEAD WO CONTRAST  Result Date: 11/11/2021 CLINICAL DATA:  Fall on blood thinners EXAM: CT HEAD WITHOUT CONTRAST TECHNIQUE: Contiguous axial images were obtained from the base of the skull through the vertex without intravenous contrast. RADIATION DOSE REDUCTION: This exam was performed according to the departmental dose-optimization program which includes automated exposure control, adjustment of the mA and/or kV according to patient size and/or use of iterative reconstruction technique. COMPARISON:  05/07/2021 FINDINGS: Brain: Focal area of parenchymal hemorrhage in the left temporal lobe measures 11 x 8 x 3 mm (series 6, image 44, series 5, image 42, and series 3, image 16). Minimal surrounding edema without  significant mass effect or midline shift. Larger amount of intraventricular hemorrhage, most prominently in the left lateral ventricle, from the frontal to the occipital to the temporal horn, but also a smaller amount in the right ventricle third ventricle, cerebral aqueduct, and fourth ventricle, extending inferiorly into the spinal canal (series 3, image 6). Possible minimal enlargement of the lateral ventricles without frank hydrocephalus. No subdural collection. Possible small amount of subarachnoid hemorrhage in the left temporal lobe (series 5, image 40). No acute infarct or mass. Vascular: No hyperdense vessel. Skull: Normal. Negative for fracture or focal lesion. Sinuses/Orbits: No acute finding. Other: The mastoid air cells are well aerated. IMPRESSION: 1. Small area of parenchymal hemorrhage in the left temporal lobe without significant mass effect or midline shift. 2. Intraventricular hemorrhage, most prominently in the left lateral ventricle, but also in the right lateral ventricle, third ventricle, and fourth ventricle. Possible minimal enlargement of the lateral ventricles without frank hydrocephalus. 3. Possible small additional amount of subarachnoid hemorrhage in the left temporal lobe. These results were called by telephone at the time of interpretation on 11/11/2021 at 9:24 pm to provider [facility], who verbally acknowledged these results. Electronically Signed   By: Merilyn Baba M.D.   On: 11/11/2021 21:24   CT CERVICAL SPINE WO CONTRAST  Result Date: 11/11/2021 CLINICAL DATA:  Trauma.  Fall. EXAM: CT CERVICAL SPINE WITHOUT CONTRAST TECHNIQUE: Multidetector CT imaging of the cervical spine was performed without intravenous contrast. Multiplanar CT image reconstructions were also generated. RADIATION DOSE REDUCTION: This exam was performed according to the departmental dose-optimization program which includes automated exposure control, adjustment of the mA and/or kV according to patient size  and/or use of iterative reconstruction technique. COMPARISON:  Cervical spine CT 05/07/2021 FINDINGS: Alignment: Normal. Skull base and vertebrae: No acute fracture. No primary bone lesion or focal pathologic process. Soft tissues and spinal canal: No prevertebral fluid or swelling. No visible canal hematoma. Disc levels: There is disc space narrowing and endplate osteophyte formation most significant at C4-C5, C5-C6 and C6-C7. There are degenerative changes of facet joints bilaterally. There is moderate neural foraminal stenosis on the left at C3-C4, C4-C5 and C5-C6 secondary to facet arthropathy and uncovertebral spurring. There is no significant central canal stenosis at any level. Upper chest: Negative. Other: Intraventricular hemorrhage fourth ventricle. IMPRESSION: 1. No acute fracture or dislocation of the cervical spine. 2. Mild degenerative changes. 3. Intraventricular hemorrhage in the fourth ventricle. Please see CT of the head. Electronically Signed   By: Ronney Asters M.D.   On: 11/11/2021 21:08   DG Pelvis Portable  Result Date: 11/11/2021 CLINICAL DATA:  Recent fall with pelvic pain, initial encounter EXAM: PORTABLE PELVIS 1 VIEWS COMPARISON:  None Available. FINDINGS: Pelvic ring is intact. No acute fracture or dislocation is noted. Mild degenerative changes of lumbar spine are seen. IMPRESSION: No acute abnormality noted. Electronically Signed   By: Inez Catalina M.D.   On: 11/11/2021 20:23   DG Chest Port 1 View  Result Date: 11/11/2021 CLINICAL DATA:  Recent fall with chest pain, initial encounter EXAM: PORTABLE CHEST 1 VIEW COMPARISON:  01/17/2020 FINDINGS: Cardiac shadow is stable. Loop recorder is again seen. Tortuous thoracic aorta is noted. The lungs are well aerated without focal infiltrate or effusion. No bony abnormality is seen. IMPRESSION: No acute abnormality noted. Electronically Signed   By: Inez Catalina M.D.   On: 11/11/2021 20:23     Treatments: therapies: PT, OT, and  ST  Discharge Exam: Blood pressure (!) 149/68, pulse 90, temperature 98.2 F (36.8 C), temperature source Oral, resp. rate 19, height '5\' 3"'$  (1.6 m), weight 71.2 kg, SpO2 97 %.  Patient is alert and pleasant. She is oriented to self and time, but not place or situation, PERRLA Speech clear, fluent She is MAE, S/S intact She c/o of some neck pain/stiffness   Disposition: Discharge disposition: 03-Skilled [place]       Discharge Instructions     Call MD for:  difficulty breathing, headache or visual disturbances   Complete by: As directed    Call MD for:  persistant nausea and vomiting   Complete by: As directed    Call MD for:  redness, tenderness, or signs of infection (pain, swelling, redness, odor or green/yellow discharge around incision site)   Complete by: As directed    Call MD for:  severe uncontrolled pain   Complete by: As directed    Call MD for:  temperature >100.4   Complete by: As directed    Diet - low sodium heart healthy   Complete by: As directed    Increase activity slowly   Complete by: As directed       Allergies as of 11/21/2021       Reactions   Nsaids Hives, Shortness Of Breath, Other (See Comments)   Extreme hives and difficulty breathing. "Has had to go to the ER."   Salicylates Other (See Comments)   (aspirin) Extreme hives and difficulty breathing. "Has had to go to the ER."   Codeine Nausea Only   Etodolac Rash   Hydrochlorothiazide Other (See Comments)   Hyponatremia HCTZ        Medication List     STOP taking these  medications    Eliquis 5 MG Tabs tablet Generic drug: apixaban       TAKE these medications    acetaminophen 500 MG tablet Commonly known as: TYLENOL Take 1,000 mg by mouth every 8 (eight) hours as needed for mild pain (for pain, headaches, or fever).   albuterol 108 (90 Base) MCG/ACT inhaler Commonly known as: VENTOLIN HFA Inhale 1-2 puffs into the lungs every 6 (six) hours as needed for  wheezing or shortness of breath.   amLODipine 5 MG tablet Commonly known as: NORVASC Take 1 tablet (5 mg total) by mouth daily.   atorvastatin 20 MG tablet Commonly known as: LIPITOR Take 20 mg by mouth at bedtime.   Colon Formula 166.67-500 MG Caps Take 1 capsule by mouth daily.   fexofenadine 180 MG tablet Commonly known as: ALLEGRA Take 180 mg by mouth daily.   latanoprost 0.005 % ophthalmic solution Commonly known as: XALATAN Place 1 drop into both eyes at bedtime.   losartan 50 MG tablet Commonly known as: COZAAR TAKE TWO TABLETS BY MOUTH DAILY   metoprolol succinate 50 MG 24 hr tablet Commonly known as: TOPROL-XL Take 50 mg by mouth daily.   nitroGLYCERIN 0.4 MG SL tablet Commonly known as: NITROSTAT Place 1 tablet (0.4 mg total) under the tongue every 5 (five) minutes as needed for chest pain.   ondansetron 4 MG tablet Commonly known as: ZOFRAN Take 4 mg by mouth every 8 (eight) hours as needed for nausea/vomiting.   PreserVision AREDS 2+Multi Vit Caps Take 1 tablet by mouth daily.   sertraline 25 MG tablet Commonly known as: ZOLOFT Take 25 mg by mouth daily.   timolol 0.5 % ophthalmic solution Commonly known as: TIMOPTIC Place 1 drop into the right eye every morning.        Follow-up Information     Earnie Larsson, MD. Schedule an appointment as soon as possible for a visit in 2 week(s).   Specialty: Neurosurgery Contact information: [address] [place] [address] [phone]                 Signed: Viona Gilmore, DNP, AGNP-C Nurse Practitioner  [facility] [place] [address], [place], [place], [place] [address] P: [phone]    F: [phone]  11/21/2021, 10:38 AM

## 2021-11-21 NOTE — Progress Notes (Addendum)
Progress Note  Patient Name: Melinda [name] Date of Encounter: 11/21/2021  [facility] Cardiologist: Sanda Klein, MD   Subjective   No chest pain, no SOB   Inpatient Medications    Scheduled Meds:  amLODipine  5 mg Oral Daily   atorvastatin  20 mg Oral QHS   latanoprost  1 drop Both Eyes QHS   loratadine  10 mg Oral Daily   losartan  100 mg Oral Daily   metoprolol succinate  50 mg Oral Daily   multivitamin  1 tablet Oral Daily   potassium chloride  20 mEq Oral BID   senna-docusate  1 tablet Oral QHS   sertraline  25 mg Oral Daily   sodium chloride  1 g Oral BID WC   timolol  1 drop Right Eye Daily   Continuous Infusions:  PRN Meds: acetaminophen **OR** acetaminophen, albuterol, HYDROmorphone (DILAUDID) injection, labetalol, nitroGLYCERIN, ondansetron **OR** ondansetron (ZOFRAN) IV, mouth rinse   Vital Signs    Vitals:   11/20/21 2052 11/21/21 0000 11/21/21 0345 11/21/21 0809  BP: (!) 144/63 (!) 159/79 (!) 155/51 (!) 149/68  Pulse: 79 79 76 90  Resp: '20  16 19  '$ Temp: 98.6 F (37 C) 98.5 F (36.9 C) 98.5 F (36.9 C) 98.2 F (36.8 C)  TempSrc: Oral Oral Oral Oral  SpO2: 98% 98% 96% 97%  Weight:      Height:        Intake/Output Summary (Last 24 hours) at 11/21/2021 1039 Last data filed at 11/21/2021 0900 Gross per 24 hour  Intake 480 ml  Output --  Net 480 ml      11/15/2021    3:59 AM 11/11/2021    8:06 PM 08/19/2021    9:49 AM  Last 3 Weights  Weight (lbs) 156 lb 15.5 oz 150 lb 159 lb 3.2 oz  Weight (kg) 71.2 kg 68.04 kg 72.213 kg      Telemetry    SR with PACs, freq at times and occ PVCs - Personally Reviewed  ECG    No recent - Personally Reviewed  Physical Exam   GEN: No acute distress.   Neck: No JVD Cardiac: RRR, 2/6 systolic murmur, no rubs, or gallops.  Respiratory: Clear to auscultation bilaterally. GI: Soft, nontender, non-distended  MS: No edema; No deformity. Neuro:  Nonfocal  Psych: Normal affect   Labs     High Sensitivity Troponin:  No results for input(s): "TROPONINIHS" in the last 720 hours.   Chemistry Recent Labs  Lab 11/18/21 0353 11/19/21 0759 11/20/21 1256  NA 131* 130* 130*  K 2.6* 2.9* 3.0*  CL 94* 92* 94*  CO2 '25 27 28  '$ GLUCOSE 112* 113* 116*  BUN 6* 7* 6*  CREATININE 0.47 0.54 0.52  CALCIUM 8.0* 7.9* 8.0*  MG 1.8  --  1.6*  GFRNONAA >60 >60 >60  ANIONGAP '12 11 8    '$ Lipids No results for input(s): "CHOL", "TRIG", "HDL", "LABVLDL", "LDLCALC", "CHOLHDL" in the last 168 hours.  HematologyNo results for input(s): "WBC", "RBC", "HGB", "HCT", "MCV", "MCH", "MCHC", "RDW", "PLT" in the last 168 hours. Thyroid No results for input(s): "TSH", "FREET4" in the last 168 hours.  BNPNo results for input(s): "BNP", "PROBNP" in the last 168 hours.  DDimer No results for input(s): "DDIMER" in the last 168 hours.   Radiology    No results found.  Cardiac Studies   Echocardiogram 11/12/2021  1. Left ventricular ejection fraction, by estimation, is 55 to 60%. The  left ventricle  has normal function. The left ventricle has no regional  wall motion abnormalities. There is mild concentric left ventricular  hypertrophy. Left ventricular diastolic  parameters are consistent with Grade I diastolic dysfunction (impaired  relaxation).   2. Right ventricular systolic function is normal. The right ventricular  size is normal. There is normal pulmonary artery systolic pressure. The  estimated right ventricular systolic pressure is 67.8 mmHg.   3. The mitral valve is grossly normal. Trivial mitral valve  regurgitation. No evidence of mitral stenosis.   4. The aortic valve is tricuspid. There is moderate calcification of the  aortic valve. There is moderate thickening of the aortic valve. Aortic  valve regurgitation is mild to moderate. Mild aortic valve stenosis.  Aortic valve area, by VTI measures 1.57  cm. Aortic valve mean gradient measures 12.0 mmHg. Aortic valve Vmax  measures 2.39  m/s.   5. The inferior vena cava is normal in size with greater than 50%  respiratory variability, suggesting right atrial pressure of 3 mmHg.   Comparison(s): No significant change from prior study.     Patient Profile     87 y.o. female  with moderate nonobstructive CAD, paroxysmal atrial fibrillation, mild aortic valve stenosis/mild-moderate aortic valve insufficiency, HTN, frequent falls, presents with disorientation following diarrheal illness leading to a fall and intraventricular hemorrhage, moderate hyponatremia.  Assessment & Plan    Intraventricular hemorrhage: -Per neurosurgery, patient denies headache.  Status post intraventricular hemorrhage with moderate associated hydrocephalus, but felt to be making progress, plan for hospital discharge today to SNF for rehab   Parox AFib:  - Patient converted to Afib with RVR yesterday AM, HR elevated to 170s. EKG showed 2:1 atrial flutter with a HR of 168 BPM  - Patient already on metoprolol succinate 50 mg daily  - BP stable today at 149/68   - Risk of anticoagulation exceeds risk of embolic stroke.  We will keep her off anticoagulants for at least the foreseeable future.  May consider referral for [facility], but benefits less at her advanced age -She has since converted to sinus rhythm again yesterday and feels well.  She had no symptoms during rapid atrial fibrillation earlier AM yesterday.  No changes to medications required at this time. -She has been hypokalemic despite repletion,  today K+ 3.0 Mg+ 1.6  This may have contributed to episodic A-fib earlier .  Will give 2 Gm IV Mg+ and an extra 20 meq K+  -plan for discharge to SNF later today.    Mild-moderate AI  Mild AS - Noted on echo from 11/12/2021 - Follow as an outpatient    CAD - Patient is asymptomatic.  - CAD was moderate by previous cardiac catheterization in 2015 - Plan to start aspirin once she recovers from the intracranial hemorrhage.   Falls - Note that  implantable loop recorder has not shown any evidence of arrhythmia at the time of her falls.   HTN - Now back on home BP medications  - Recommended target is systolic blood pressure in the 140-150 mmHg range, rather than "perfect" blood pressure control, due to her history of frequent falls.          For questions or updates, please contact [place] Please consult [email or web address] for contact info under        Signed, Cecilie Kicks, NP  11/21/2021, 10:39 AM    Pt not seen, chart reviewed. Agree with electrolyte repletion as above. Office visit arranged 9/12. Elouise Munroe, MD

## 2021-11-21 NOTE — Progress Notes (Signed)
Overall stable.  Minimal headache.  1 brief episode of A-fib with RVR yesterday now resolved.  No chest pain.  No shortness of breath.  Overall progressing reasonably well.  Okay for discharge to skilled nursing facility later today.

## 2021-11-21 NOTE — TOC Transition Note (Signed)
Transition of Care [facility]) - CM/SW Discharge Note   Patient Details  Name: Melinda [name] MRN: [id number] Date of Birth: 1934-12-15  [facility] of [facility]) CM/SW Contact:  Geralynn Ochs, LCSW Phone Number: 11/21/2021, 12:05 PM   Clinical Narrative:   CSW confirmed with MD and [facility] that patient could admit to SNF today. CSW sent discharge information, confirmed bed availability. Transport scheduled with [facility] for next available. CSW notified patient's spouse via phone, left a voicemail.  Nurse to call report to [phone], Room 606P.    Final next level of care: [place] Barriers to Discharge: Barriers Resolved   Patient Goals and CMS Choice Patient states their goals for this hospitalization and ongoing recovery are:: to get [facility] to [facility].gov Compare Post Acute Care list provided to:: Patient Choice offered to / list presented to : Patient  Discharge Placement              Patient chooses bed at: [facility] Patient to be transferred to facility by: [facility] Name of family member notified: Self, Herbie [place] Patient and family notified of of transfer: 11/21/21  Discharge Plan and Services     Post Acute Care Choice: [place]                               Social Determinants of Health (SDOH) Interventions     Readmission Risk Interventions     No data to display

## 2021-11-25 ENCOUNTER — Ambulatory Visit (INDEPENDENT_AMBULATORY_CARE_PROVIDER_SITE_OTHER): Payer: Medicare Other

## 2021-11-25 DIAGNOSIS — R55 Syncope and collapse: Secondary | ICD-10-CM | POA: Diagnosis not present

## 2021-11-25 LAB — CUP PACEART REMOTE DEVICE CHECK
Date Time Interrogation Session: 20230817230314
Implantable Pulse Generator Implant Date: 20210111

## 2021-11-25 NOTE — Progress Notes (Signed)
Carelink Summary Report / Loop Recorder 

## 2021-11-27 DIAGNOSIS — I480 Paroxysmal atrial fibrillation: Secondary | ICD-10-CM | POA: Diagnosis not present

## 2021-11-27 DIAGNOSIS — I615 Nontraumatic intracerebral hemorrhage, intraventricular: Secondary | ICD-10-CM | POA: Diagnosis not present

## 2021-11-27 DIAGNOSIS — E7800 Pure hypercholesterolemia, unspecified: Secondary | ICD-10-CM | POA: Diagnosis not present

## 2021-11-27 DIAGNOSIS — I10 Essential (primary) hypertension: Secondary | ICD-10-CM | POA: Diagnosis not present

## 2021-12-04 DIAGNOSIS — W19XXXA Unspecified fall, initial encounter: Secondary | ICD-10-CM | POA: Diagnosis not present

## 2021-12-04 DIAGNOSIS — F330 Major depressive disorder, recurrent, mild: Secondary | ICD-10-CM | POA: Diagnosis not present

## 2021-12-04 DIAGNOSIS — R2689 Other abnormalities of gait and mobility: Secondary | ICD-10-CM | POA: Diagnosis not present

## 2021-12-04 DIAGNOSIS — Z9181 History of falling: Secondary | ICD-10-CM | POA: Diagnosis not present

## 2021-12-04 DIAGNOSIS — M6281 Muscle weakness (generalized): Secondary | ICD-10-CM | POA: Diagnosis not present

## 2021-12-04 DIAGNOSIS — K5900 Constipation, unspecified: Secondary | ICD-10-CM | POA: Diagnosis not present

## 2021-12-04 DIAGNOSIS — R630 Anorexia: Secondary | ICD-10-CM | POA: Diagnosis not present

## 2021-12-05 DIAGNOSIS — I615 Nontraumatic intracerebral hemorrhage, intraventricular: Secondary | ICD-10-CM | POA: Diagnosis not present

## 2021-12-08 NOTE — Progress Notes (Unsigned)
Cardiology Office Note:    Date:  12/09/2021   ID:  SLOAN GALENTINE, DOB 20-Sep-1934, MRN [id number]  PCP:  [name], MD   [place] [facility] Cardiologist:  Sanda Klein, MD Cardiology APP:  Ledora Bottcher, [place] { Referring MD: [name], MD   Chief Complaint  Patient presents with   Hospitalization Follow-up    PAF    History of Present Illness:    Melinda [name] is a 87 y.o. female with a hx of nonobstructive CAD, PAF, mild aortic valve stenosis with mild to moderate AI, hypertension, and frequent falls.  She has a history of Takotsubo cardiomyopathy in 07/2013 with complete resolution.  Heart catheterization at that time showed 75% ostial OM1 stenosis. LVEF improved with GDMT. She was admitted 11/2013 with new onset Afib with RVR. I saw her in 12/2017 following a syncopal episode suspected due to dehydration and extreme changes in temperature. She had ILR placed Jan 2021 for recurrent syncope. ILR has noted one brief episode of Afib.   She was hospitalized 10/2021 after sustaining a fall. She had been nauseous and vomiting over the preceding 2-3 days. She was walking in the kitchen and felt dizzy and fell backward. She was hypokalemic and hyponatremic on arrival . She is on eliquis. Imaging revealed intraventricular hemorrhage. [place] reversed with andexxa. Unfortunately, she converted to Afib RVR during her hospitalization, EKG with 2:1 atrial flutter.  Thankfully she converted back to sinus rhythm.  However the risks of anticoagulation felt outweighed the benefits.  Oral anticoagulation was held at discharge to SNF.  Of note, ILR did not show any evidence of arrhythmia at the time of her falls.  Per Dr. Sallyanne Kuster, her target SBP is 140-150 due to recent falls. She is regaining cognitive function. She is now complaining of headache and right face pain and states her teeth are not in alignment. I reviewed her imaging, no mention of jaw fracture. It is also  unclear which side she landed on - husband found her on her back and bleeding was on the left side, but her pain is on her right side.   Past Medical History:  Diagnosis Date   Allergy    Anemia 1984   before hysterectomy   Breast cancer ([place])    bilateral   GERD (gastroesophageal reflux disease)    History of blood product transfusion 1984   Hyperlipidemia    Hypertension    Macular degeneration    right eye   Myocardial infarction ([place])    Osteopenia    Personal history of radiation therapy    Takotsubo cardiomyopathy     Past Surgical History:  Procedure Laterality Date   ABDOMINAL HYSTERECTOMY  1984   APPENDECTOMY     BREAST BIOPSY Right 04/29/05   right    BREAST BIOPSY Left 2006   BREAST LUMPECTOMY Left 04/19/04   left with sentinel node   BREAST LUMPECTOMY Right 2007   CATARACT EXTRACTION     bilateral   EYE SURGERY     LEFT HEART CATHETERIZATION WITH CORONARY ANGIOGRAM N/A 07/26/2013   Procedure: LEFT HEART CATHETERIZATION WITH CORONARY ANGIOGRAM;  Surgeon: Jettie Booze, MD;  Location: [facility] CATH LAB;  Service: Cardiovascular;  Laterality: N/A;   RETINAL LASER PROCEDURE     right    Current Medications: Current Meds  Medication Sig   acetaminophen (TYLENOL) 500 MG tablet Take 1,000 mg by mouth every 8 (eight) hours as needed for mild pain (for pain, headaches, or  fever).   albuterol (VENTOLIN HFA) 108 (90 Base) MCG/ACT inhaler Inhale 1-2 puffs into the lungs every 6 (six) hours as needed for wheezing or shortness of breath.   amLODipine (NORVASC) 5 MG tablet Take 1 tablet (5 mg total) by mouth daily.   atorvastatin (LIPITOR) 20 MG tablet Take 20 mg by mouth at bedtime.   fexofenadine (ALLEGRA) 180 MG tablet Take 180 mg by mouth daily.   latanoprost (XALATAN) 0.005 % ophthalmic solution Place 1 drop into both eyes at bedtime.    losartan (COZAAR) 50 MG tablet TAKE TWO TABLETS BY MOUTH DAILY (Patient taking differently: Take 100 mg by mouth daily.)    metoprolol succinate (TOPROL-XL) 50 MG 24 hr tablet Take 50 mg by mouth daily.   Multiple Vitamins-Minerals (PRESERVISION AREDS 2+MULTI VIT) CAPS Take 1 tablet by mouth daily.   nitroGLYCERIN (NITROSTAT) 0.4 MG SL tablet Place 1 tablet (0.4 mg total) under the tongue every 5 (five) minutes as needed for chest pain.   Nutritional Supplements (COLON FORMULA) 166.67-500 MG CAPS Take 1 capsule by mouth daily.   ondansetron (ZOFRAN) 4 MG tablet Take 4 mg by mouth every 8 (eight) hours as needed for nausea/vomiting.   sertraline (ZOLOFT) 25 MG tablet Take 25 mg by mouth daily.   timolol (TIMOPTIC) 0.5 % ophthalmic solution Place 1 drop into the right eye every morning.    Current Facility-Administered Medications for the 12/09/21 encounter (Office Visit) with Ledora Bottcher, PA  Medication   lidocaine-EPINEPHrine (XYLOCAINE W/EPI) 1 %-1:100000 (with pres) injection 10 mL     Allergies:   Nsaids, Salicylates, Codeine, Etodolac, and Hydrochlorothiazide   Social History   Socioeconomic History   Marital status: Married    Spouse name: Herbie Baltimore   Number of children: 2   Years of education: college   Highest education level: Not on file  Occupational History   Not on file  Tobacco Use   Smoking status: Former    Types: Cigarettes    Quit date: 1990    Years since quitting: 33.7   Smokeless tobacco: Never  Vaping Use   Vaping Use: Never used  Substance and Sexual Activity   Alcohol use: Yes    Comment: 3 drinks per night, "too much"   Drug use: No   Sexual activity: Not on file  Other Topics Concern   Not on file  Social History Narrative   Lives with Spouse and dog   Drinks 3 cups of caffeine daily   Right handed             Social Determinants of Health   Financial Resource Strain: Not on file  Food Insecurity: Not on file  Transportation Needs: Not on file  Physical Activity: Not on file  Stress: Not on file  Social Connections: Not on file     Family History: The  patient's family history includes Breast cancer in her maternal aunt and maternal grandmother; Cancer in her father, maternal aunt, and maternal grandmother; Dementia in her mother; Heart attack in her maternal grandfather and paternal grandfather; Heart disease (age of onset: 51) in her father; Heart disease (age of onset: 58) in her mother; Stroke in her mother.  ROS:   Please see the history of present illness.     All other systems reviewed and are negative.  EKGs/Labs/Other Studies Reviewed:    The following studies were reviewed today:  Echocardiogram 11/12/2021  1. Left ventricular ejection fraction, by estimation, is 55 to 60%. The  left  ventricle has normal function. The left ventricle has no regional  wall motion abnormalities. There is mild concentric left ventricular  hypertrophy. Left ventricular diastolic  parameters are consistent with Grade I diastolic dysfunction (impaired  relaxation).   2. Right ventricular systolic function is normal. The right ventricular  size is normal. There is normal pulmonary artery systolic pressure. The  estimated right ventricular systolic pressure is 38.1 mmHg.   3. The mitral valve is grossly normal. Trivial mitral valve  regurgitation. No evidence of mitral stenosis.   4. The aortic valve is tricuspid. There is moderate calcification of the  aortic valve. There is moderate thickening of the aortic valve. Aortic  valve regurgitation is mild to moderate. Mild aortic valve stenosis.  Aortic valve area, by VTI measures 1.57  cm. Aortic valve mean gradient measures 12.0 mmHg. Aortic valve Vmax  measures 2.39 m/s.   5. The inferior vena cava is normal in size with greater than 50%  respiratory variability, suggesting right atrial pressure of 3 mmHg.   EKG:  EKG is not ordered today.   Recent Labs: 11/11/2021: ALT 18 11/12/2021: Hemoglobin 14.8; Platelets 271 11/20/2021: BUN 6; Creatinine, Ser 0.52; Magnesium 1.6; Potassium 3.0; Sodium 130   Recent Lipid Panel    Component Value Date/Time   CHOL 159 07/26/2013 0718   TRIG 86 07/26/2013 0718   HDL 89 07/26/2013 0718   CHOLHDL 1.8 07/26/2013 0718   VLDL 17 07/26/2013 0718   LDLCALC 53 07/26/2013 0718     Risk Assessment/Calculations:    CHA2DS2-VASc Score = 5   This indicates a 7.2% annual risk of stroke. The patient's score is based upon: CHF History: 0 HTN History: 1 Diabetes History: 0 Stroke History: 0 Vascular Disease History: 1 Age Score: 2 Gender Score: 1   Physical Exam:    VS:  BP (!) 150/60   Pulse 61   Ht '5\' 2"'$  (1.575 m)   Wt 148 lb (67.1 kg)   SpO2 98%   BMI 27.07 kg/m     Wt Readings from Last 3 Encounters:  12/09/21 148 lb (67.1 kg)  11/15/21 156 lb 15.5 oz (71.2 kg)  08/19/21 159 lb 3.2 oz (72.2 kg)     GEN:  Well nourished, well developed in no acute distress HEENT: Normal NECK: No JVD; No carotid bruits LYMPHATICS: No lymphadenopathy CARDIAC: RRR, 4/6 systolic murmur RESPIRATORY:  Clear to auscultation without rales, wheezing or rhonchi  ABDOMEN: Soft, non-tender, non-distended MUSCULOSKELETAL:  No edema; No deformity  SKIN: Warm and dry NEUROLOGIC:  Alert and oriented x 3 PSYCHIATRIC:  Normal affect   ASSESSMENT:    1. Paroxysmal atrial fibrillation (HCC)   2. PAT (paroxysmal atrial tachycardia) ([place])   3. IVH (intraventricular hemorrhage) (HCC)   4. Essential hypertension   5. Coronary artery disease involving native coronary artery of native heart without angina pectoris   6. Hypercholesterolemia   7. Takotsubo cardiomyopathy   8. Aortic valve insufficiency, etiology of cardiac valve disease unspecified   9. Nonintractable headache, unspecified chronicity pattern, unspecified headache type    PLAN:    In order of problems listed above:  Paroxysmal atrial fibrillation Paroxysmal atrial flutter Continue beta-blocker She states she is unaware when she is in Afib Mg was 1.6 prior to discharge K was 3.0 Will need  to check these for replacement to try to keep her in sinus rhythm - I hesitate to prescribe 400 mg Mg Ox given diarrhea side effect - dehydration with N/V/D is the likely  culprit of her fall - husband reports she is more cold that normal, will check a TSH   Chronic anticoagulation Intraventricular hemorrhage OAC has been held Consider referral for Watchman once recovered   Hypertension Continue beta-blocker, amlodipine, losartan SBP target is 140-150 given frequent falls   CAD Hyperlipidemia Hx of stress cardiomyopathy with resolution of LVEF BB, losartan No ASA given recent bleed, no prior intervention   Mild to moderate AI Mild AS No dyspnea Loud murmur on exam   Right sided head and jaw pain Teeth are not aligning correctly Headache No mention of fracture on imaging, no step-off on exam Will refer back to NSG to get proper imaging ordered for her She is having trouble eating due to teeth misalignment    She resides at [facility] Phone: [phone]    Medication Adjustments/Labs and Tests Ordered: Current medicines are reviewed at length with the patient today.  Concerns regarding medicines are outlined above.  Orders Placed This Encounter  Procedures   Magnesium   TSH   Basic Metabolic Panel (BMET)   No orders of the defined types were placed in this encounter.   There are no Patient Instructions on file for this visit.   Signed, Ledora Bottcher, [place]  12/09/2021 3:31 PM    [facility]

## 2021-12-09 ENCOUNTER — Encounter: Payer: Self-pay | Admitting: Physician Assistant

## 2021-12-09 ENCOUNTER — Ambulatory Visit: Payer: Medicare Other | Attending: Physician Assistant | Admitting: Physician Assistant

## 2021-12-09 VITALS — BP 150/60 | HR 61 | Ht 62.0 in | Wt 148.0 lb

## 2021-12-09 DIAGNOSIS — I480 Paroxysmal atrial fibrillation: Secondary | ICD-10-CM | POA: Diagnosis not present

## 2021-12-09 DIAGNOSIS — N390 Urinary tract infection, site not specified: Secondary | ICD-10-CM | POA: Diagnosis not present

## 2021-12-09 DIAGNOSIS — I10 Essential (primary) hypertension: Secondary | ICD-10-CM | POA: Insufficient documentation

## 2021-12-09 DIAGNOSIS — R519 Headache, unspecified: Secondary | ICD-10-CM | POA: Diagnosis not present

## 2021-12-09 DIAGNOSIS — I5181 Takotsubo syndrome: Secondary | ICD-10-CM | POA: Insufficient documentation

## 2021-12-09 DIAGNOSIS — I471 Supraventricular tachycardia: Secondary | ICD-10-CM | POA: Insufficient documentation

## 2021-12-09 DIAGNOSIS — E7800 Pure hypercholesterolemia, unspecified: Secondary | ICD-10-CM | POA: Diagnosis not present

## 2021-12-09 DIAGNOSIS — N181 Chronic kidney disease, stage 1: Secondary | ICD-10-CM | POA: Diagnosis not present

## 2021-12-09 DIAGNOSIS — D649 Anemia, unspecified: Secondary | ICD-10-CM | POA: Diagnosis not present

## 2021-12-09 DIAGNOSIS — E871 Hypo-osmolality and hyponatremia: Secondary | ICD-10-CM | POA: Diagnosis not present

## 2021-12-09 DIAGNOSIS — I2510 Atherosclerotic heart disease of native coronary artery without angina pectoris: Secondary | ICD-10-CM | POA: Diagnosis not present

## 2021-12-09 DIAGNOSIS — I351 Nonrheumatic aortic (valve) insufficiency: Secondary | ICD-10-CM | POA: Insufficient documentation

## 2021-12-09 DIAGNOSIS — I615 Nontraumatic intracerebral hemorrhage, intraventricular: Secondary | ICD-10-CM | POA: Insufficient documentation

## 2021-12-09 NOTE — Patient Instructions (Signed)
Medication Instructions:  Your physician recommends that you continue on your current medications as directed. Please refer to the Current Medication list given to you today.  *If you need a refill on your cardiac medications before your next appointment, please call your pharmacy*   Lab Work: Your physician recommends that you have the following labs drawn today:  Magnesium, BMET and TSH  If you have labs (blood work) drawn today and your tests are completely normal, you will receive your results only by: [place] (if you have MyChart) OR A paper copy in the mail If you have any lab test that is abnormal or we need to change your treatment, we will call you to review the results.   Testing/Procedures: NONE ordered at this time of appointment     Follow-Up: At [facility], you and your health needs are our priority.  As part of our continuing mission to provide you with exceptional heart care, we have created designated Provider Care Teams.  These Care Teams include your primary Cardiologist (physician) and Advanced Practice Providers (APPs -  Physician Assistants and Nurse Practitioners) who all work together to provide you with the care you need, when you need it.  We recommend signing up for the patient portal called "MyChart".  Sign up information is provided on this After Visit Summary.  MyChart is used to connect with patients for Virtual Visits (Telemedicine).  Patients are able to view lab/test results, encounter notes, upcoming appointments, etc.  Non-urgent messages can be sent to your provider as well.   To learn more about what you can do with MyChart, go to [email or web address].    Your next appointment:   1 month(s)  The format for your next appointment:   In Person  Provider:   Sanda Klein, MD     Other Instructions If nothing is available with Dr. Loletha Grayer it is okay to put her back on Angie's schedule on a day that Dr.C is in the office.

## 2021-12-10 ENCOUNTER — Ambulatory Visit: Payer: Medicare Other | Admitting: Physician Assistant

## 2021-12-10 ENCOUNTER — Telehealth: Payer: Self-pay | Admitting: Cardiovascular Disease

## 2021-12-10 DIAGNOSIS — G501 Atypical facial pain: Secondary | ICD-10-CM | POA: Diagnosis not present

## 2021-12-10 LAB — BASIC METABOLIC PANEL
BUN/Creatinine Ratio: 11 — ABNORMAL LOW (ref 12–28)
BUN: 5 mg/dL — ABNORMAL LOW (ref 8–27)
CO2: 29 mmol/L (ref 20–29)
Calcium: 9.5 mg/dL (ref 8.7–10.3)
Chloride: 92 mmol/L — ABNORMAL LOW (ref 96–106)
Creatinine, Ser: 0.44 mg/dL — ABNORMAL LOW (ref 0.57–1.00)
Glucose: 93 mg/dL (ref 70–99)
Potassium: 3.3 mmol/L — ABNORMAL LOW (ref 3.5–5.2)
Sodium: 140 mmol/L (ref 134–144)
eGFR: 95 mL/min/{1.73_m2} (ref >59)

## 2021-12-10 LAB — TSH: TSH: 2.68 u[IU]/mL (ref 0.450–4.500)

## 2021-12-10 LAB — MAGNESIUM: Magnesium: 1.9 mg/dL (ref 1.6–2.3)

## 2021-12-10 NOTE — Telephone Encounter (Signed)
Spouse informed of recent lab work and K+ order for 2 days. He stated he would follow up with staff at the rehab facility.

## 2021-12-10 NOTE — Telephone Encounter (Signed)
Husband returned RN's call regarding test results.

## 2021-12-11 ENCOUNTER — Other Ambulatory Visit: Payer: Self-pay | Admitting: Student

## 2021-12-11 ENCOUNTER — Telehealth: Payer: Self-pay | Admitting: Cardiovascular Disease

## 2021-12-11 ENCOUNTER — Other Ambulatory Visit (HOSPITAL_COMMUNITY): Payer: Self-pay | Admitting: Student

## 2021-12-11 DIAGNOSIS — E871 Hypo-osmolality and hyponatremia: Secondary | ICD-10-CM | POA: Diagnosis not present

## 2021-12-11 DIAGNOSIS — R6884 Jaw pain: Secondary | ICD-10-CM | POA: Diagnosis not present

## 2021-12-11 DIAGNOSIS — Z7689 Persons encountering health services in other specified circumstances: Secondary | ICD-10-CM | POA: Diagnosis not present

## 2021-12-11 DIAGNOSIS — I615 Nontraumatic intracerebral hemorrhage, intraventricular: Secondary | ICD-10-CM

## 2021-12-11 NOTE — Telephone Encounter (Signed)
Patient's husband calling to inform the patient has a CT scheduled 9/22. He also says he got a call from [employer] that the patients monitor is not working. He says it was not currently with her at the living facility she is at. He states he will take it there and see if it can be plugged in.

## 2021-12-11 NOTE — Telephone Encounter (Signed)
Returned call to patient's husband Left message on voice mail to call back.

## 2021-12-19 NOTE — Telephone Encounter (Signed)
Spoke with pt husband, he has been able to get her equipment plugged in and working. He does not need anything else at this time.

## 2021-12-19 NOTE — Progress Notes (Signed)
Carelink Summary Report / Loop Recorder 

## 2021-12-20 ENCOUNTER — Ambulatory Visit (HOSPITAL_COMMUNITY)
Admission: RE | Admit: 2021-12-20 | Discharge: 2021-12-20 | Disposition: A | Discharge status: Home / Self Care | Payer: Medicare Other | Source: Ambulatory Visit | Attending: Student | Admitting: Student

## 2021-12-20 DIAGNOSIS — I615 Nontraumatic intracerebral hemorrhage, intraventricular: Secondary | ICD-10-CM

## 2021-12-25 DIAGNOSIS — M6281 Muscle weakness (generalized): Secondary | ICD-10-CM | POA: Diagnosis not present

## 2021-12-25 DIAGNOSIS — Z9181 History of falling: Secondary | ICD-10-CM | POA: Diagnosis not present

## 2021-12-25 DIAGNOSIS — R2689 Other abnormalities of gait and mobility: Secondary | ICD-10-CM | POA: Diagnosis not present

## 2021-12-26 DIAGNOSIS — E876 Hypokalemia: Secondary | ICD-10-CM | POA: Diagnosis not present

## 2021-12-26 DIAGNOSIS — Z8679 Personal history of other diseases of the circulatory system: Secondary | ICD-10-CM | POA: Diagnosis not present

## 2021-12-26 DIAGNOSIS — Z0000 Encounter for general adult medical examination without abnormal findings: Secondary | ICD-10-CM | POA: Diagnosis not present

## 2021-12-26 DIAGNOSIS — Z23 Encounter for immunization: Secondary | ICD-10-CM | POA: Diagnosis not present

## 2021-12-26 DIAGNOSIS — R2689 Other abnormalities of gait and mobility: Secondary | ICD-10-CM | POA: Diagnosis not present

## 2021-12-26 DIAGNOSIS — E559 Vitamin D deficiency, unspecified: Secondary | ICD-10-CM | POA: Diagnosis not present

## 2021-12-26 DIAGNOSIS — D6869 Other thrombophilia: Secondary | ICD-10-CM | POA: Diagnosis not present

## 2021-12-26 DIAGNOSIS — J301 Allergic rhinitis due to pollen: Secondary | ICD-10-CM | POA: Diagnosis not present

## 2021-12-26 DIAGNOSIS — F39 Unspecified mood [affective] disorder: Secondary | ICD-10-CM | POA: Diagnosis not present

## 2021-12-26 DIAGNOSIS — E7800 Pure hypercholesterolemia, unspecified: Secondary | ICD-10-CM | POA: Diagnosis not present

## 2021-12-26 DIAGNOSIS — H35033 Hypertensive retinopathy, bilateral: Secondary | ICD-10-CM | POA: Diagnosis not present

## 2021-12-26 DIAGNOSIS — I119 Hypertensive heart disease without heart failure: Secondary | ICD-10-CM | POA: Diagnosis not present

## 2021-12-26 DIAGNOSIS — M8588 Other specified disorders of bone density and structure, other site: Secondary | ICD-10-CM | POA: Diagnosis not present

## 2021-12-26 DIAGNOSIS — I4891 Unspecified atrial fibrillation: Secondary | ICD-10-CM | POA: Diagnosis not present

## 2021-12-26 DIAGNOSIS — I700 Atherosclerosis of aorta: Secondary | ICD-10-CM | POA: Diagnosis not present

## 2021-12-26 DIAGNOSIS — R2681 Unsteadiness on feet: Secondary | ICD-10-CM | POA: Diagnosis not present

## 2021-12-26 DIAGNOSIS — I615 Nontraumatic intracerebral hemorrhage, intraventricular: Secondary | ICD-10-CM | POA: Diagnosis not present

## 2021-12-30 ENCOUNTER — Ambulatory Visit (INDEPENDENT_AMBULATORY_CARE_PROVIDER_SITE_OTHER): Payer: Medicare Other

## 2021-12-30 DIAGNOSIS — R278 Other lack of coordination: Secondary | ICD-10-CM | POA: Diagnosis not present

## 2021-12-30 DIAGNOSIS — I615 Nontraumatic intracerebral hemorrhage, intraventricular: Secondary | ICD-10-CM | POA: Diagnosis not present

## 2021-12-30 DIAGNOSIS — I480 Paroxysmal atrial fibrillation: Secondary | ICD-10-CM | POA: Diagnosis not present

## 2021-12-30 DIAGNOSIS — M6281 Muscle weakness (generalized): Secondary | ICD-10-CM | POA: Diagnosis not present

## 2021-12-30 DIAGNOSIS — R2681 Unsteadiness on feet: Secondary | ICD-10-CM | POA: Diagnosis not present

## 2021-12-30 DIAGNOSIS — R2689 Other abnormalities of gait and mobility: Secondary | ICD-10-CM | POA: Diagnosis not present

## 2021-12-30 DIAGNOSIS — R41841 Cognitive communication deficit: Secondary | ICD-10-CM | POA: Diagnosis not present

## 2021-12-30 DIAGNOSIS — I10 Essential (primary) hypertension: Secondary | ICD-10-CM | POA: Diagnosis not present

## 2021-12-31 DIAGNOSIS — R2689 Other abnormalities of gait and mobility: Secondary | ICD-10-CM | POA: Diagnosis not present

## 2021-12-31 DIAGNOSIS — I615 Nontraumatic intracerebral hemorrhage, intraventricular: Secondary | ICD-10-CM | POA: Diagnosis not present

## 2021-12-31 DIAGNOSIS — M6281 Muscle weakness (generalized): Secondary | ICD-10-CM | POA: Diagnosis not present

## 2021-12-31 DIAGNOSIS — I10 Essential (primary) hypertension: Secondary | ICD-10-CM | POA: Diagnosis not present

## 2021-12-31 DIAGNOSIS — R41841 Cognitive communication deficit: Secondary | ICD-10-CM | POA: Diagnosis not present

## 2021-12-31 DIAGNOSIS — R2681 Unsteadiness on feet: Secondary | ICD-10-CM | POA: Diagnosis not present

## 2021-12-31 LAB — CUP PACEART REMOTE DEVICE CHECK
Date Time Interrogation Session: 20231003111647
Implantable Pulse Generator Implant Date: 20210111

## 2022-01-02 DIAGNOSIS — R2689 Other abnormalities of gait and mobility: Secondary | ICD-10-CM | POA: Diagnosis not present

## 2022-01-02 DIAGNOSIS — R41841 Cognitive communication deficit: Secondary | ICD-10-CM | POA: Diagnosis not present

## 2022-01-02 DIAGNOSIS — M6281 Muscle weakness (generalized): Secondary | ICD-10-CM | POA: Diagnosis not present

## 2022-01-02 DIAGNOSIS — I615 Nontraumatic intracerebral hemorrhage, intraventricular: Secondary | ICD-10-CM | POA: Diagnosis not present

## 2022-01-02 DIAGNOSIS — I10 Essential (primary) hypertension: Secondary | ICD-10-CM | POA: Diagnosis not present

## 2022-01-02 DIAGNOSIS — R2681 Unsteadiness on feet: Secondary | ICD-10-CM | POA: Diagnosis not present

## 2022-01-02 NOTE — Progress Notes (Deleted)
Cardiology Office Note:    Date:  01/02/2022   ID:  LUCILLA PETRENKO, DOB 05/16/1934, MRN [id number]  PCP:  [name], MD   [place] [facility] Cardiologist:  Sanda Klein, MD Cardiology APP:  Ledora Bottcher, [place] { Click to update primary MD,subspecialty MD or APP then REFRESH:1}    Referring MD: [name], MD   No chief complaint on file. ***  History of Present Illness:    Melinda [name] is a 87 y.o. female with a hx of nonobstructive CAD, PAF, mild aortic valve stenosis with mild to moderate AI, hypertension, and frequent falls.  She has a history of Takotsubo cardiomyopathy in 07/2013 with complete resolution.  Heart catheterization at that time showed 75% ostial OM1 stenosis. LVEF improved with GDMT. She was admitted 11/2013 with new onset Afib with RVR. I saw her in 12/2017 following a syncopal episode suspected due to dehydration and extreme changes in temperature. She had ILR placed Jan 2021 for recurrent syncope. ILR has noted one brief episode of Afib.   She was hospitalized 10/2021 after sustaining a fall. She had been nauseous and vomiting over the preceding 2-3 days. She was walking in the kitchen and felt dizzy and fell backward. She was hypokalemic and hyponatremic on arrival . She is on eliquis. Imaging revealed intraventricular hemorrhage. [place] reversed with andexxa. Unfortunately, she converted to Afib RVR during her hospitalization, EKG with 2:1 atrial flutter.  Thankfully she converted back to sinus rhythm.  However the risks of anticoagulation felt outweighed the benefits.  Oral anticoagulation was held at discharge to SNF.  Of note, ILR did not show any evidence of arrhythmia at the time of her falls.  Per Dr. Sallyanne Kuster, her target SBP is 140-150 due to recent falls. I saw her in follow up 12/09/21 and she weas regaining cognitive function. She complained of headache and right face pain and that her teeth were no longer in alignment.    Mg was 1.6 and K was 3.0 prior to discharge. I did not prescribe OTC Mg since dehydration with N/V/D was felt to be the likely culprit for her fall.   Paroxysmal atrial fibrillation Paroxysmal atrial flutter She is unaware of her rhythm.  Continue beta-blocker   Chronic anticoagulation Intraventricular hemorrhage OAC has been held Consider referral for [facility] once recovered   Hypertension Continue beta-blocker, amlodipine, losartan SBP target is 140-150 for hx of falls   CAD Hyperlipidemia Hx of stress cardiomyopathy with resolution of LVEF BB, losartan No ASA given recent bleed, no prior intervention   Mild to moderate AI Mild AS No dyspnea Loud murmur on exam       Past Medical History:  Diagnosis Date   Allergy    Anemia 1984   before hysterectomy   Breast cancer ([place])    bilateral   GERD (gastroesophageal reflux disease)    History of blood product transfusion 1984   Hyperlipidemia    Hypertension    Macular degeneration    right eye   Myocardial infarction ([place])    Osteopenia    Personal history of radiation therapy    Takotsubo cardiomyopathy     Past Surgical History:  Procedure Laterality Date   ABDOMINAL HYSTERECTOMY  1984   APPENDECTOMY     BREAST BIOPSY Right 04/29/05   right    BREAST BIOPSY Left 2006   BREAST LUMPECTOMY Left 04/19/04   left with sentinel node   BREAST LUMPECTOMY Right 2007   CATARACT EXTRACTION  bilateral   EYE SURGERY     LEFT HEART CATHETERIZATION WITH CORONARY ANGIOGRAM N/A 07/26/2013   Procedure: LEFT HEART CATHETERIZATION WITH CORONARY ANGIOGRAM;  Surgeon: Jettie Booze, MD;  Location: [facility] CATH LAB;  Service: Cardiovascular;  Laterality: N/A;   RETINAL LASER PROCEDURE     right    Current Medications: No outpatient medications have been marked as taking for the 01/15/22 encounter (Appointment) with Ledora Bottcher, [place].   Current Facility-Administered Medications for the 01/15/22 encounter  (Appointment) with Ledora Bottcher, PA  Medication   lidocaine-EPINEPHrine (XYLOCAINE W/EPI) 1 %-1:100000 (with pres) injection 10 mL     Allergies:   Nsaids, Salicylates, Codeine, Etodolac, and Hydrochlorothiazide   Social History   Socioeconomic History   Marital status: Married    Spouse name: Herbie Baltimore   Number of children: 2   Years of education: college   Highest education level: Not on file  Occupational History   Not on file  Tobacco Use   Smoking status: Former    Types: Cigarettes    Quit date: 1990    Years since quitting: 33.7   Smokeless tobacco: Never  Vaping Use   Vaping Use: Never used  Substance and Sexual Activity   Alcohol use: Yes    Comment: 3 drinks per night, "too much"   Drug use: No   Sexual activity: Not on file  Other Topics Concern   Not on file  Social History Narrative   Lives with Spouse and dog   Drinks 3 cups of caffeine daily   Right handed             Social Determinants of Health   Financial Resource Strain: Not on file  Food Insecurity: Not on file  Transportation Needs: Not on file  Physical Activity: Not on file  Stress: Not on file  Social Connections: Not on file     Family History: The patient's ***family history includes Breast cancer in her maternal aunt and maternal grandmother; Cancer in her father, maternal aunt, and maternal grandmother; Dementia in her mother; Heart attack in her maternal grandfather and paternal grandfather; Heart disease (age of onset: 59) in her father; Heart disease (age of onset: 95) in her mother; Stroke in her mother.  ROS:   Please see the history of present illness.    *** All other systems reviewed and are negative.  EKGs/Labs/Other Studies Reviewed:    The following studies were reviewed today: ***  EKG:  EKG is *** ordered today.  The ekg ordered today demonstrates ***  Recent Labs: 11/11/2021: ALT 18 11/12/2021: Hemoglobin 14.8; Platelets 271 12/09/2021: BUN 5; Creatinine,  Ser 0.44; Magnesium 1.9; Potassium 3.3; Sodium 140; TSH 2.680  Recent Lipid Panel    Component Value Date/Time   CHOL 159 07/26/2013 0718   TRIG 86 07/26/2013 0718   HDL 89 07/26/2013 0718   CHOLHDL 1.8 07/26/2013 0718   VLDL 17 07/26/2013 0718   LDLCALC 53 07/26/2013 0718     Risk Assessment/Calculations:   {Does this patient have ATRIAL FIBRILLATION?:[phone]}  No BP recorded.  {Refresh Note OR Click here to enter BP  :1}***         Physical Exam:    VS:  There were no vitals taken for this visit.    Wt Readings from Last 3 Encounters:  12/09/21 148 lb (67.1 kg)  11/15/21 156 lb 15.5 oz (71.2 kg)  08/19/21 159 lb 3.2 oz (72.2 kg)     GEN: *** Well  nourished, well developed in no acute distress HEENT: Normal NECK: No JVD; No carotid bruits LYMPHATICS: No lymphadenopathy CARDIAC: ***RRR, no murmurs, rubs, gallops RESPIRATORY:  Clear to auscultation without rales, wheezing or rhonchi  ABDOMEN: Soft, non-tender, non-distended MUSCULOSKELETAL:  No edema; No deformity  SKIN: Warm and dry NEUROLOGIC:  Alert and oriented x 3 PSYCHIATRIC:  Normal affect   ASSESSMENT:    No diagnosis found. PLAN:    In order of problems listed above:  ***      {Are you ordering a CV Procedure (e.g. stress test, cath, DCCV, TEE, etc)?   Press F2        :[id number]}    Medication Adjustments/Labs and Tests Ordered: Current medicines are reviewed at length with the patient today.  Concerns regarding medicines are outlined above.  No orders of the defined types were placed in this encounter.  No orders of the defined types were placed in this encounter.   There are no Patient Instructions on file for this visit.   Signed, Tami Lin Dalana Pfahler, PA  01/02/2022 4:12 PM    [facility]

## 2022-01-06 DIAGNOSIS — R2689 Other abnormalities of gait and mobility: Secondary | ICD-10-CM | POA: Diagnosis not present

## 2022-01-06 DIAGNOSIS — R41841 Cognitive communication deficit: Secondary | ICD-10-CM | POA: Diagnosis not present

## 2022-01-06 DIAGNOSIS — I615 Nontraumatic intracerebral hemorrhage, intraventricular: Secondary | ICD-10-CM | POA: Diagnosis not present

## 2022-01-06 DIAGNOSIS — I10 Essential (primary) hypertension: Secondary | ICD-10-CM | POA: Diagnosis not present

## 2022-01-06 DIAGNOSIS — M6281 Muscle weakness (generalized): Secondary | ICD-10-CM | POA: Diagnosis not present

## 2022-01-06 DIAGNOSIS — R2681 Unsteadiness on feet: Secondary | ICD-10-CM | POA: Diagnosis not present

## 2022-01-07 DIAGNOSIS — Z9181 History of falling: Secondary | ICD-10-CM | POA: Diagnosis not present

## 2022-01-09 DIAGNOSIS — I615 Nontraumatic intracerebral hemorrhage, intraventricular: Secondary | ICD-10-CM | POA: Diagnosis not present

## 2022-01-09 DIAGNOSIS — R41841 Cognitive communication deficit: Secondary | ICD-10-CM | POA: Diagnosis not present

## 2022-01-09 DIAGNOSIS — M6281 Muscle weakness (generalized): Secondary | ICD-10-CM | POA: Diagnosis not present

## 2022-01-09 DIAGNOSIS — I10 Essential (primary) hypertension: Secondary | ICD-10-CM | POA: Diagnosis not present

## 2022-01-09 DIAGNOSIS — R2689 Other abnormalities of gait and mobility: Secondary | ICD-10-CM | POA: Diagnosis not present

## 2022-01-09 DIAGNOSIS — R2681 Unsteadiness on feet: Secondary | ICD-10-CM | POA: Diagnosis not present

## 2022-01-13 DIAGNOSIS — M6281 Muscle weakness (generalized): Secondary | ICD-10-CM | POA: Diagnosis not present

## 2022-01-13 DIAGNOSIS — R41841 Cognitive communication deficit: Secondary | ICD-10-CM | POA: Diagnosis not present

## 2022-01-13 DIAGNOSIS — I10 Essential (primary) hypertension: Secondary | ICD-10-CM | POA: Diagnosis not present

## 2022-01-13 DIAGNOSIS — R2689 Other abnormalities of gait and mobility: Secondary | ICD-10-CM | POA: Diagnosis not present

## 2022-01-13 DIAGNOSIS — I615 Nontraumatic intracerebral hemorrhage, intraventricular: Secondary | ICD-10-CM | POA: Diagnosis not present

## 2022-01-13 DIAGNOSIS — R2681 Unsteadiness on feet: Secondary | ICD-10-CM | POA: Diagnosis not present

## 2022-01-14 DIAGNOSIS — I615 Nontraumatic intracerebral hemorrhage, intraventricular: Secondary | ICD-10-CM | POA: Diagnosis not present

## 2022-01-14 DIAGNOSIS — R2681 Unsteadiness on feet: Secondary | ICD-10-CM | POA: Diagnosis not present

## 2022-01-14 DIAGNOSIS — R41841 Cognitive communication deficit: Secondary | ICD-10-CM | POA: Diagnosis not present

## 2022-01-14 DIAGNOSIS — M6281 Muscle weakness (generalized): Secondary | ICD-10-CM | POA: Diagnosis not present

## 2022-01-14 DIAGNOSIS — I10 Essential (primary) hypertension: Secondary | ICD-10-CM | POA: Diagnosis not present

## 2022-01-14 DIAGNOSIS — R2689 Other abnormalities of gait and mobility: Secondary | ICD-10-CM | POA: Diagnosis not present

## 2022-01-14 DIAGNOSIS — K5900 Constipation, unspecified: Secondary | ICD-10-CM | POA: Diagnosis not present

## 2022-01-15 ENCOUNTER — Ambulatory Visit: Payer: Medicare Other | Admitting: Physician Assistant

## 2022-01-15 NOTE — Progress Notes (Signed)
Carelink Summary Report / Loop Recorder 

## 2022-01-16 DIAGNOSIS — R2689 Other abnormalities of gait and mobility: Secondary | ICD-10-CM | POA: Diagnosis not present

## 2022-01-16 DIAGNOSIS — R2681 Unsteadiness on feet: Secondary | ICD-10-CM | POA: Diagnosis not present

## 2022-01-16 DIAGNOSIS — I10 Essential (primary) hypertension: Secondary | ICD-10-CM | POA: Diagnosis not present

## 2022-01-16 DIAGNOSIS — M6281 Muscle weakness (generalized): Secondary | ICD-10-CM | POA: Diagnosis not present

## 2022-01-16 DIAGNOSIS — R41841 Cognitive communication deficit: Secondary | ICD-10-CM | POA: Diagnosis not present

## 2022-01-16 DIAGNOSIS — I615 Nontraumatic intracerebral hemorrhage, intraventricular: Secondary | ICD-10-CM | POA: Diagnosis not present

## 2022-01-21 DIAGNOSIS — R2681 Unsteadiness on feet: Secondary | ICD-10-CM | POA: Diagnosis not present

## 2022-01-21 DIAGNOSIS — R2689 Other abnormalities of gait and mobility: Secondary | ICD-10-CM | POA: Diagnosis not present

## 2022-01-21 DIAGNOSIS — I10 Essential (primary) hypertension: Secondary | ICD-10-CM | POA: Diagnosis not present

## 2022-01-21 DIAGNOSIS — I615 Nontraumatic intracerebral hemorrhage, intraventricular: Secondary | ICD-10-CM | POA: Diagnosis not present

## 2022-01-21 DIAGNOSIS — R41841 Cognitive communication deficit: Secondary | ICD-10-CM | POA: Diagnosis not present

## 2022-01-21 DIAGNOSIS — M6281 Muscle weakness (generalized): Secondary | ICD-10-CM | POA: Diagnosis not present

## 2022-01-23 DIAGNOSIS — M6281 Muscle weakness (generalized): Secondary | ICD-10-CM | POA: Diagnosis not present

## 2022-01-23 DIAGNOSIS — R41841 Cognitive communication deficit: Secondary | ICD-10-CM | POA: Diagnosis not present

## 2022-01-23 DIAGNOSIS — R2689 Other abnormalities of gait and mobility: Secondary | ICD-10-CM | POA: Diagnosis not present

## 2022-01-23 DIAGNOSIS — R2681 Unsteadiness on feet: Secondary | ICD-10-CM | POA: Diagnosis not present

## 2022-01-23 DIAGNOSIS — I615 Nontraumatic intracerebral hemorrhage, intraventricular: Secondary | ICD-10-CM | POA: Diagnosis not present

## 2022-01-23 DIAGNOSIS — I10 Essential (primary) hypertension: Secondary | ICD-10-CM | POA: Diagnosis not present

## 2022-01-27 DIAGNOSIS — R2681 Unsteadiness on feet: Secondary | ICD-10-CM | POA: Diagnosis not present

## 2022-01-27 DIAGNOSIS — R2689 Other abnormalities of gait and mobility: Secondary | ICD-10-CM | POA: Diagnosis not present

## 2022-01-27 DIAGNOSIS — M6281 Muscle weakness (generalized): Secondary | ICD-10-CM | POA: Diagnosis not present

## 2022-01-27 DIAGNOSIS — R41841 Cognitive communication deficit: Secondary | ICD-10-CM | POA: Diagnosis not present

## 2022-01-27 DIAGNOSIS — I10 Essential (primary) hypertension: Secondary | ICD-10-CM | POA: Diagnosis not present

## 2022-01-27 DIAGNOSIS — I615 Nontraumatic intracerebral hemorrhage, intraventricular: Secondary | ICD-10-CM | POA: Diagnosis not present

## 2022-01-29 NOTE — Progress Notes (Unsigned)
Cardiology Office Note:    Date:  01/30/2022   ID:  Melinda [name], DOB 1934/08/17, MRN [id number]  PCP:  [name], MD   [facility] [facility] Cardiologist:  Sanda Klein, MD Cardiology APP:  Ledora Bottcher, [place]     Referring MD: [name], MD   Chief Complaint  Patient presents with   Follow-up    IVH    History of Present Illness:    Melinda [name] is a 87 y.o. female with a hx of nonobstructive CAD, PAF, mild aortic valve stenosis with mild to moderate AI, hypertension, and frequent falls.  She has a history of Takotsubo cardiomyopathy in 07/2013 with complete resolution.  Heart catheterization at that time showed 75% ostial OM1 stenosis. LVEF improved with GDMT. She was admitted 11/2013 with new onset Afib with RVR. I saw her in 12/2017 following a syncopal episode suspected due to dehydration and extreme changes in temperature. She had ILR placed Jan 2021 for recurrent syncope. ILR has noted one brief episode of Afib.   She was hospitalized 10/2021 after sustaining a fall. She had been nauseous and vomiting over the preceding 2-3 days. She was walking in the kitchen and felt dizzy and fell backward. She was hypokalemic and hyponatremic on arrival . She is on eliquis. Imaging revealed intraventricular hemorrhage. Anderson reversed with andexxa. Unfortunately, she converted to Afib RVR during her hospitalization, EKG with 2:1 atrial flutter.  Thankfully she converted back to sinus rhythm.  However the risks of anticoagulation felt outweighed the benefits.  Oral anticoagulation was held at discharge to SNF.  Of note, ILR did not show any evidence of arrhythmia at the time of her falls.  Per Dr. Sallyanne Kuster, her target SBP is 140-150 due to recent falls. I saw her in follow up 12/09/21 and she weas regaining cognitive function. She complained of headache and right face pain and that her teeth were no longer in alignment.   Mg was 1.6 and K was 3.0 prior to  discharge. I did not prescribe OTC Mg since dehydration with N/V/D was felt to be the likely culprit for her fall. I saw her in follow up on 12/09/21 and she complained of jaw and head pain, I referred her back to NSG and PCP.   She presents today for follow up. In the interim, Dr. Sallyanne Kuster reviewed her ILR download on 12/31/21 and noted no true Afib has been documented since Mar 31 2020 - she has had atrial tachycardia with long RP mechanism suspicious for ectopic atrial tachycardia. [place] has been stopped permanently.   She is doing much better at follow up but with persistent headache on left side of her head that is exacerbated by head movement. Pain wakes her from sleep. She has an appt with neurology at the end of this month.  No cardiac complaints.   Past Medical History:  Diagnosis Date   Allergy    Anemia 1984   before hysterectomy   Breast cancer ([place])    bilateral   GERD (gastroesophageal reflux disease)    History of blood product transfusion 1984   Hyperlipidemia    Hypertension    Macular degeneration    right eye   Myocardial infarction ([place])    Osteopenia    Personal history of radiation therapy    Takotsubo cardiomyopathy     Past Surgical History:  Procedure Laterality Date   ABDOMINAL HYSTERECTOMY  1984   APPENDECTOMY     BREAST BIOPSY Right 04/29/05  right    BREAST BIOPSY Left 2006   BREAST LUMPECTOMY Left 04/19/04   left with sentinel node   BREAST LUMPECTOMY Right 2007   CATARACT EXTRACTION     bilateral   EYE SURGERY     LEFT HEART CATHETERIZATION WITH CORONARY ANGIOGRAM N/A 07/26/2013   Procedure: LEFT HEART CATHETERIZATION WITH CORONARY ANGIOGRAM;  Surgeon: Jettie Booze, MD;  Location: [facility] CATH LAB;  Service: Cardiovascular;  Laterality: N/A;   RETINAL LASER PROCEDURE     right    Current Medications: Current Meds  Medication Sig   acetaminophen (TYLENOL) 500 MG tablet Take 1,000 mg by mouth every 8 (eight) hours as needed for mild pain (for  pain, headaches, or fever).   albuterol (VENTOLIN HFA) 108 (90 Base) MCG/ACT inhaler Inhale 1-2 puffs into the lungs every 6 (six) hours as needed for wheezing or shortness of breath.   amLODipine (NORVASC) 5 MG tablet Take 1 tablet (5 mg total) by mouth daily.   atorvastatin (LIPITOR) 20 MG tablet Take 20 mg by mouth at bedtime.   fexofenadine (ALLEGRA) 180 MG tablet Take 180 mg by mouth daily.   latanoprost (XALATAN) 0.005 % ophthalmic solution Place 1 drop into both eyes at bedtime.    losartan (COZAAR) 50 MG tablet TAKE TWO TABLETS BY MOUTH DAILY (Patient taking differently: Take 100 mg by mouth daily.)   metoprolol succinate (TOPROL-XL) 50 MG 24 hr tablet Take 50 mg by mouth daily.   nitroGLYCERIN (NITROSTAT) 0.4 MG SL tablet Place 1 tablet (0.4 mg total) under the tongue every 5 (five) minutes as needed for chest pain.   Nutritional Supplements (COLON FORMULA) 166.67-500 MG CAPS Take 1 capsule by mouth daily.   sertraline (ZOLOFT) 25 MG tablet Take 25 mg by mouth daily.   timolol (TIMOPTIC) 0.5 % ophthalmic solution Place 1 drop into the right eye every morning.    Current Facility-Administered Medications for the 01/30/22 encounter (Office Visit) with Ledora Bottcher, PA  Medication   lidocaine-EPINEPHrine (XYLOCAINE W/EPI) 1 %-1:100000 (with pres) injection 10 mL     Allergies:   Nsaids, Salicylates, Codeine, Etodolac, and Hydrochlorothiazide   Social History   Socioeconomic History   Marital status: Married    Spouse name: Herbie [place]   Number of children: 2   Years of education: college   Highest education level: Not on file  Occupational History   Not on file  Tobacco Use   Smoking status: Former    Types: Cigarettes    Quit date: 1990    Years since quitting: 33.8   Smokeless tobacco: Never  Vaping Use   Vaping Use: Never used  Substance and Sexual Activity   Alcohol use: Yes    Comment: 3 drinks per night, "too much"   Drug use: No   Sexual activity: Not on file   Other Topics Concern   Not on file  Social History Narrative   Lives with Spouse and dog   Drinks 3 cups of caffeine daily   Right handed             Social Determinants of Health   Financial Resource Strain: Not on file  Food Insecurity: Not on file  Transportation Needs: Not on file  Physical Activity: Not on file  Stress: Not on file  Social Connections: Not on file     Family History: The patient's family history includes Breast cancer in her maternal aunt and maternal grandmother; Cancer in her father, maternal aunt, and maternal grandmother; Dementia  in her mother; Heart attack in her maternal grandfather and paternal grandfather; Heart disease (age of onset: 56) in her father; Heart disease (age of onset: 70) in her mother; Stroke in her mother.  ROS:   Please see the history of present illness.     All other systems reviewed and are negative.  EKGs/Labs/Other Studies Reviewed:    The following studies were reviewed today:  Echo 10/2021:  1. Left ventricular ejection fraction, by estimation, is 55 to 60%. The  left ventricle has normal function. The left ventricle has no regional  wall motion abnormalities. There is mild concentric left ventricular  hypertrophy. Left ventricular diastolic  parameters are consistent with Grade I diastolic dysfunction (impaired  relaxation).   2. Right ventricular systolic function is normal. The right ventricular  size is normal. There is normal pulmonary artery systolic pressure. The  estimated right ventricular systolic pressure is 46.5 mmHg.   3. The mitral valve is grossly normal. Trivial mitral valve  regurgitation. No evidence of mitral stenosis.   4. The aortic valve is tricuspid. There is moderate calcification of the  aortic valve. There is moderate thickening of the aortic valve. Aortic  valve regurgitation is mild to moderate. Mild aortic valve stenosis.  Aortic valve area, by VTI measures 1.57  cm. Aortic valve mean  gradient measures 12.0 mmHg. Aortic valve Vmax  measures 2.39 m/s.   5. The inferior vena cava is normal in size with greater than 50%  respiratory variability, suggesting right atrial pressure of 3 mmHg.   EKG:  EKG is not ordered today.    Recent Labs: 11/11/2021: ALT 18 11/12/2021: Hemoglobin 14.8; Platelets 271 12/09/2021: BUN 5; Creatinine, Ser 0.44; Magnesium 1.9; Potassium 3.3; Sodium 140; TSH 2.680  Recent Lipid Panel    Component Value Date/Time   CHOL 159 07/26/2013 0718   TRIG 86 07/26/2013 0718   HDL 89 07/26/2013 0718   CHOLHDL 1.8 07/26/2013 0718   VLDL 17 07/26/2013 0718   LDLCALC 53 07/26/2013 0718     Risk Assessment/Calculations:    CHA2DS2-VASc Score = 5   This indicates a 7.2% annual risk of stroke. The patient's score is based upon: CHF History: 0 HTN History: 1 Diabetes History: 0 Stroke History: 0 Vascular Disease History: 1 Age Score: 2 Gender Score: 1   BP target is 140     Physical Exam:    VS:  BP (!) 142/62 (BP Location: Left Arm, Patient Position: Sitting, Cuff Size: Normal)   Pulse 68   Ht '5\' 3"'$  (1.6 m)   Wt 152 lb 3.2 oz (69 kg)   SpO2 94%   BMI 26.96 kg/m     Wt Readings from Last 3 Encounters:  01/30/22 152 lb 3.2 oz (69 kg)  12/09/21 148 lb (67.1 kg)  11/15/21 156 lb 15.5 oz (71.2 kg)     GEN:  Well nourished, well developed in no acute distress HEENT: Normal NECK: No JVD; No carotid bruits LYMPHATICS: No lymphadenopathy CARDIAC: RRR, no murmurs, rubs, gallops RESPIRATORY:  Clear to auscultation without rales, wheezing or rhonchi  ABDOMEN: Soft, non-tender, non-distended MUSCULOSKELETAL:  No edema; No deformity  SKIN: Warm and dry NEUROLOGIC:  Alert and oriented x 3 PSYCHIATRIC:  Normal affect   ASSESSMENT:    1. Paroxysmal atrial fibrillation (HCC)   2. PAT (paroxysmal atrial tachycardia)   3. IVH (intraventricular hemorrhage) (HCC)   4. Essential hypertension   5. Coronary artery disease involving native coronary  artery of native heart without  angina pectoris   6. Hypercholesterolemia   7. Takotsubo cardiomyopathy   8. Aortic valve insufficiency, etiology of cardiac valve disease unspecified    PLAN:    In order of problems listed above:  Paroxysmal atrial fibrillation Paroxysmal atrial flutter Ectopic atrial tachycardia She was unaware of her rhythm. No Afib documented recently. Continue beta-blocker   Intraventricular hemorrhage [place] has been held permanently I discussed watchman - she wants to wait for another 2-3 months. I suggested just getting the referral placed and she can decide on appt time.    Hypertension Continue beta-blocker, amlodipine, losartan SBP target is 140-150 for hx of falls   CAD Hyperlipidemia Hx of stress cardiomyopathy with resolution of LVEF BB, losartan No ASA given recent bleed, no prior intervention   Mild to moderate AI Mild AS No dyspnea Loud murmur on exam No shortness of breath, consider echo in 1 year    Follow up in 4-6 months with Dr. Sallyanne Kuster.        Medication Adjustments/Labs and Tests Ordered: Current medicines are reviewed at length with the patient today.  Concerns regarding medicines are outlined above.  Orders Placed This Encounter  Procedures   Ambulatory referral to Cardiac Electrophysiology   No orders of the defined types were placed in this encounter.   Patient Instructions  Medication Instructions:  No Changes *If you need a refill on your cardiac medications before your next appointment, please call your pharmacy*   Lab Work: No Labs If you have labs (blood work) drawn today and your tests are completely normal, you will receive your results only by: [place] (if you have MyChart) OR A paper copy in the mail If you have any lab test that is abnormal or we need to change your treatment, we will call you to review the results.   Testing/Procedures: No Testing   Follow-Up: At [facility],  you and your health needs are our priority.  As part of our continuing mission to provide you with exceptional heart care, we have created designated Provider Care Teams.  These Care Teams include your primary Cardiologist (physician) and Advanced Practice Providers (APPs -  Physician Assistants and Nurse Practitioners) who all work together to provide you with the care you need, when you need it.  We recommend signing up for the patient portal called "MyChart".  Sign up information is provided on this After Visit Summary.  MyChart is used to connect with patients for Virtual Visits (Telemedicine).  Patients are able to view lab/test results, encounter notes, upcoming appointments, etc.  Non-urgent messages can be sent to your provider as well.   To learn more about what you can do with MyChart, go to [email or web address].    Your next appointment:   4-6 month(s)  The format for your next appointment:   In Person  Provider:   Sanda Klein, MD    Signed, [place], [place]  01/30/2022 1:01 PM    [place]

## 2022-01-30 ENCOUNTER — Ambulatory Visit: Payer: Medicare Other | Attending: Physician Assistant | Admitting: Physician Assistant

## 2022-01-30 ENCOUNTER — Encounter: Payer: Self-pay | Admitting: Physician Assistant

## 2022-01-30 VITALS — BP 142/62 | HR 68 | Ht 63.0 in | Wt 152.2 lb

## 2022-01-30 DIAGNOSIS — I5181 Takotsubo syndrome: Secondary | ICD-10-CM

## 2022-01-30 DIAGNOSIS — I615 Nontraumatic intracerebral hemorrhage, intraventricular: Secondary | ICD-10-CM

## 2022-01-30 DIAGNOSIS — I10 Essential (primary) hypertension: Secondary | ICD-10-CM | POA: Diagnosis not present

## 2022-01-30 DIAGNOSIS — I4719 Other supraventricular tachycardia: Secondary | ICD-10-CM | POA: Diagnosis not present

## 2022-01-30 DIAGNOSIS — I480 Paroxysmal atrial fibrillation: Secondary | ICD-10-CM

## 2022-01-30 DIAGNOSIS — I351 Nonrheumatic aortic (valve) insufficiency: Secondary | ICD-10-CM | POA: Diagnosis not present

## 2022-01-30 DIAGNOSIS — I2510 Atherosclerotic heart disease of native coronary artery without angina pectoris: Secondary | ICD-10-CM | POA: Diagnosis not present

## 2022-01-30 DIAGNOSIS — E7800 Pure hypercholesterolemia, unspecified: Secondary | ICD-10-CM | POA: Diagnosis not present

## 2022-01-30 NOTE — Patient Instructions (Signed)
Medication Instructions:  No Changes *If you need a refill on your cardiac medications before your next appointment, please call your pharmacy*   Lab Work: No Labs If you have labs (blood work) drawn today and your tests are completely normal, you will receive your results only by: [place] (if you have MyChart) OR A paper copy in the mail If you have any lab test that is abnormal or we need to change your treatment, we will call you to review the results.   Testing/Procedures: No Testing   Follow-Up: At [facility], you and your health needs are our priority.  As part of our continuing mission to provide you with exceptional heart care, we have created designated Provider Care Teams.  These Care Teams include your primary Cardiologist (physician) and Advanced Practice Providers (APPs -  Physician Assistants and Nurse Practitioners) who all work together to provide you with the care you need, when you need it.  We recommend signing up for the patient portal called "MyChart".  Sign up information is provided on this After Visit Summary.  MyChart is used to connect with patients for Virtual Visits (Telemedicine).  Patients are able to view lab/test results, encounter notes, upcoming appointments, etc.  Non-urgent messages can be sent to your provider as well.   To learn more about what you can do with MyChart, go to [email or web address].    Your next appointment:   4-6 month(s)  The format for your next appointment:   In Person  Provider:   Sanda Klein, MD

## 2022-01-31 ENCOUNTER — Telehealth: Payer: Self-pay | Admitting: Cardiology

## 2022-01-31 NOTE — Telephone Encounter (Signed)
Attempted call to discuss timing of Watchman consult with no answer. LVM to return call.   Kathyrn Drown NP-C Structural Heart Team  Pager: [phone] Phone: [phone]

## 2022-02-03 ENCOUNTER — Ambulatory Visit (INDEPENDENT_AMBULATORY_CARE_PROVIDER_SITE_OTHER): Payer: Medicare Other

## 2022-02-03 DIAGNOSIS — R55 Syncope and collapse: Secondary | ICD-10-CM

## 2022-02-03 DIAGNOSIS — R2681 Unsteadiness on feet: Secondary | ICD-10-CM | POA: Diagnosis not present

## 2022-02-03 DIAGNOSIS — I10 Essential (primary) hypertension: Secondary | ICD-10-CM | POA: Diagnosis not present

## 2022-02-03 DIAGNOSIS — R41841 Cognitive communication deficit: Secondary | ICD-10-CM | POA: Diagnosis not present

## 2022-02-03 DIAGNOSIS — I615 Nontraumatic intracerebral hemorrhage, intraventricular: Secondary | ICD-10-CM | POA: Diagnosis not present

## 2022-02-03 DIAGNOSIS — R2689 Other abnormalities of gait and mobility: Secondary | ICD-10-CM | POA: Diagnosis not present

## 2022-02-03 NOTE — Telephone Encounter (Signed)
The patient called to report she does not wish to proceed with Watchman consult. She has the Structural Heart direct phone line to call if she changes her mind. She was grateful for assistance.

## 2022-02-04 LAB — CUP PACEART REMOTE DEVICE CHECK
Date Time Interrogation Session: 20231103230451
Implantable Pulse Generator Implant Date: 20210111

## 2022-02-05 DIAGNOSIS — I10 Essential (primary) hypertension: Secondary | ICD-10-CM | POA: Diagnosis not present

## 2022-02-05 DIAGNOSIS — R2689 Other abnormalities of gait and mobility: Secondary | ICD-10-CM | POA: Diagnosis not present

## 2022-02-05 DIAGNOSIS — R2681 Unsteadiness on feet: Secondary | ICD-10-CM | POA: Diagnosis not present

## 2022-02-05 DIAGNOSIS — R41841 Cognitive communication deficit: Secondary | ICD-10-CM | POA: Diagnosis not present

## 2022-02-05 DIAGNOSIS — I615 Nontraumatic intracerebral hemorrhage, intraventricular: Secondary | ICD-10-CM | POA: Diagnosis not present

## 2022-02-10 DIAGNOSIS — I615 Nontraumatic intracerebral hemorrhage, intraventricular: Secondary | ICD-10-CM | POA: Diagnosis not present

## 2022-02-10 DIAGNOSIS — R2681 Unsteadiness on feet: Secondary | ICD-10-CM | POA: Diagnosis not present

## 2022-02-10 DIAGNOSIS — R41841 Cognitive communication deficit: Secondary | ICD-10-CM | POA: Diagnosis not present

## 2022-02-10 DIAGNOSIS — R2689 Other abnormalities of gait and mobility: Secondary | ICD-10-CM | POA: Diagnosis not present

## 2022-02-10 DIAGNOSIS — I10 Essential (primary) hypertension: Secondary | ICD-10-CM | POA: Diagnosis not present

## 2022-02-12 DIAGNOSIS — R2681 Unsteadiness on feet: Secondary | ICD-10-CM | POA: Diagnosis not present

## 2022-02-12 DIAGNOSIS — R41841 Cognitive communication deficit: Secondary | ICD-10-CM | POA: Diagnosis not present

## 2022-02-12 DIAGNOSIS — I615 Nontraumatic intracerebral hemorrhage, intraventricular: Secondary | ICD-10-CM | POA: Diagnosis not present

## 2022-02-12 DIAGNOSIS — I10 Essential (primary) hypertension: Secondary | ICD-10-CM | POA: Diagnosis not present

## 2022-02-12 DIAGNOSIS — R2689 Other abnormalities of gait and mobility: Secondary | ICD-10-CM | POA: Diagnosis not present

## 2022-02-15 DIAGNOSIS — Z23 Encounter for immunization: Secondary | ICD-10-CM | POA: Diagnosis not present

## 2022-02-26 ENCOUNTER — Telehealth: Payer: Self-pay | Admitting: Psychiatry

## 2022-02-26 ENCOUNTER — Encounter: Payer: Self-pay | Admitting: Psychiatry

## 2022-02-26 ENCOUNTER — Ambulatory Visit (INDEPENDENT_AMBULATORY_CARE_PROVIDER_SITE_OTHER): Payer: Medicare Other | Admitting: Psychiatry

## 2022-02-26 VITALS — BP 137/52 | HR 68 | Ht 62.0 in | Wt 151.0 lb

## 2022-02-26 DIAGNOSIS — M5481 Occipital neuralgia: Secondary | ICD-10-CM

## 2022-02-26 DIAGNOSIS — M542 Cervicalgia: Secondary | ICD-10-CM | POA: Diagnosis not present

## 2022-02-26 NOTE — Telephone Encounter (Signed)
Referral sent to [facility] per pt preference, phone # [phone].

## 2022-02-26 NOTE — Progress Notes (Signed)
Referring:  Mckinley Jewel, MD [address] [place],  [place] [address]  PCP: Mckinley Jewel, MD  Neurology was asked to evaluate Melinda [name], an 87 year old female for a chief complaint of headaches.  Our recommendations of care will be communicated by shared medical record.    CC:  headaches  History provided from self, spouse Herbie Baltimore  HPI:  Medical co-morbidities: afib, traumatic ICH with IVH, CAD, HTN  The patient presents for evaluation of headaches which began after a fall c/b traumatic ICH with IVH on 11/11/21. She was on eliquis for afib at that time. AC has since been discontinued.   Since the fall she has developed daily headaches, which she describes as a sensation of soreness/tenderness on the right side of her head and neck. Sometimes has tingling on her scalp as well. She cannot sleep on the right side of her head due to pain. Pain is always present but fluctuates in severity. It is not associated with photophobia, phonophobia, or nausea. She takes Tylenol daily but is not sure if it helps.  Most recent [place] on 12/20/21 showed resolution of IVH.  She has noticed her memory is worse since her ICH/IVH. Has been misplacing objects around the house and has to be reminded of things frequently.  Headache History: Onset: August 2023 Triggers: reading Aura: none Location: right occiput and neck Quality/Description: soreness Associated Symptoms:  Photophobia: no  Phonophobia: no  Nausea: no Worse with activity?: yes Duration of headaches: constant  Headache days per month: 30 Headache free days per month: 0  Current Treatment: Abortive Tylenol  Preventative none  Prior Therapies                                 Losartan 100 mg daily Metoprolol 50 mg daily Zoloft 25 mg daily   LABS: CBC    Component Value Date/Time   WBC 9.7 11/12/2021 0106   RBC 4.31 11/12/2021 0106   HGB 14.8 11/12/2021 0106   HGB 12.5 08/06/2010 1554   HCT 42.0  11/12/2021 0106   HCT 36.8 08/06/2010 1554   PLT 271 11/12/2021 0106   PLT 249 08/06/2010 1554   MCV 97.4 11/12/2021 0106   MCV 98.1 08/06/2010 1554   MCH 34.3 (H) 11/12/2021 0106   MCHC 35.2 11/12/2021 0106   RDW 11.6 11/12/2021 0106   RDW 12.4 08/06/2010 1554   LYMPHSABS 1.4 05/07/2021 1206   LYMPHSABS 1.6 08/06/2010 1554   MONOABS 0.7 05/07/2021 1206   MONOABS 0.4 08/06/2010 1554   EOSABS 0.1 05/07/2021 1206   EOSABS 0.0 08/06/2010 1554   BASOSABS 0.0 05/07/2021 1206   BASOSABS 0.0 08/06/2010 1554      Latest Ref Rng & Units 12/09/2021    3:47 PM 11/20/2021   12:56 PM 11/19/2021    7:59 AM  CMP  Glucose 70 - 99 mg/dL 93  116  113   BUN 8 - 27 mg/dL '5  6  7   '$ Creatinine 0.57 - 1.00 mg/dL 0.44  0.52  0.54   Sodium 134 - 144 mmol/L 140  130  130   Potassium 3.5 - 5.2 mmol/L 3.3  3.0  2.9   Chloride 96 - 106 mmol/L 92  94  92   CO2 20 - 29 mmol/L '29  28  27   '$ Calcium 8.7 - 10.3 mg/dL 9.5  8.0  7.9  IMAGING:  [place] 11/11/21: 1. Small area of parenchymal hemorrhage in the left temporal lobe without significant mass effect or midline shift. 2. Intraventricular hemorrhage, most prominently in the left lateral ventricle, but also in the right lateral ventricle, third ventricle, and fourth ventricle. Possible minimal enlargement of the lateral ventricles without frank hydrocephalus. 3. Possible small additional amount of subarachnoid hemorrhage in the left temporal lobe.  [facility] 12/20/21: Resolution of the intraventricular blood products in the left lateral ventricle since 11/19/2021. No change in ventricular size.  CT C-spine 11/11/21: moderate left foraminal stenosis C3-4, C4-5, C5-6  Imaging independently reviewed on February 26, 2022   Current Outpatient Medications on File Prior to Visit  Medication Sig Dispense Refill   acetaminophen (TYLENOL) 500 MG tablet Take 1,000 mg by mouth every 8 (eight) hours as needed for mild pain (for pain, headaches, or fever).      albuterol (VENTOLIN HFA) 108 (90 Base) MCG/ACT inhaler Inhale 1-2 puffs into the lungs every 6 (six) hours as needed for wheezing or shortness of breath.     atorvastatin (LIPITOR) 20 MG tablet Take 20 mg by mouth at bedtime.     fexofenadine (ALLEGRA) 180 MG tablet Take 180 mg by mouth daily.     latanoprost (XALATAN) 0.005 % ophthalmic solution Place 1 drop into both eyes at bedtime.      losartan (COZAAR) 50 MG tablet TAKE TWO TABLETS BY MOUTH DAILY (Patient taking differently: Take 100 mg by mouth daily.) 180 tablet 3   metoprolol succinate (TOPROL-XL) 50 MG 24 hr tablet Take 50 mg by mouth daily.     Multiple Vitamins-Minerals (PRESERVISION AREDS 2+MULTI VIT) CAPS Take 1 tablet by mouth daily.     nitroGLYCERIN (NITROSTAT) 0.4 MG SL tablet Place 1 tablet (0.4 mg total) under the tongue every 5 (five) minutes as needed for chest pain. 25 tablet 3   sertraline (ZOLOFT) 25 MG tablet Take 25 mg by mouth daily.     timolol (TIMOPTIC) 0.5 % ophthalmic solution Place 1 drop into the right eye every morning.      amLODipine (NORVASC) 5 MG tablet Take 1 tablet (5 mg total) by mouth daily. 90 tablet 1   Nutritional Supplements (COLON FORMULA) 166.67-500 MG CAPS Take 1 capsule by mouth daily. (Patient not taking: Reported on 02/26/2022)     ondansetron (ZOFRAN) 4 MG tablet Take 4 mg by mouth every 8 (eight) hours as needed for nausea/vomiting. (Patient not taking: Reported on 01/30/2022)     Current Facility-Administered Medications on File Prior to Visit  Medication Dose Route Frequency Provider Last Rate Last Admin   lidocaine-EPINEPHrine (XYLOCAINE W/EPI) 1 %-1:100000 (with pres) injection 10 mL  10 mL Infiltration Once Croitoru, Mihai, MD         Allergies: Allergies  Allergen Reactions   Nsaids Hives, Shortness Of Breath and Other (See Comments)    Extreme hives and difficulty breathing. "Has had to go to the ER."   Salicylates Other (See Comments)    (aspirin) Extreme hives and difficulty  breathing. "Has had to go to the ER."   Codeine Nausea Only   Etodolac Rash   Hydrochlorothiazide Other (See Comments)    Hyponatremia HCTZ    Family History: Migraine or other headaches in the family:  no Aneurysms in a first degree relative:  no Brain tumors in the family:  no Other neurological illness in the family:   no  Past Medical History: Past Medical History:  Diagnosis Date   Allergy  Anemia 1984   before hysterectomy   Breast cancer ([place])    bilateral   GERD (gastroesophageal reflux disease)    History of blood product transfusion 1984   Hyperlipidemia    Hypertension    Macular degeneration    right eye   Myocardial infarction ([place])    Osteopenia    Personal history of radiation therapy    Takotsubo cardiomyopathy     Past Surgical History Past Surgical History:  Procedure Laterality Date   ABDOMINAL HYSTERECTOMY  1984   APPENDECTOMY     BREAST BIOPSY Right 04/29/05   right    BREAST BIOPSY Left 2006   BREAST LUMPECTOMY Left 04/19/04   left with sentinel node   BREAST LUMPECTOMY Right 2007   CATARACT EXTRACTION     bilateral   EYE SURGERY     LEFT HEART CATHETERIZATION WITH CORONARY ANGIOGRAM N/A 07/26/2013   Procedure: LEFT HEART CATHETERIZATION WITH CORONARY ANGIOGRAM;  Surgeon: Jettie Booze, MD;  Location: [facility] CATH LAB;  Service: Cardiovascular;  Laterality: N/A;   RETINAL LASER PROCEDURE     right    Social History: Social History   Tobacco Use   Smoking status: Former    Types: Cigarettes    Quit date: 1990    Years since quitting: 33.9   Smokeless tobacco: Never  Vaping Use   Vaping Use: Never used  Substance Use Topics   Alcohol use: Yes    Comment: 3 drinks per night, "too much"   Drug use: No    ROS: Negative for fevers, chills. Positive for headaches. All other systems reviewed and negative unless stated otherwise in HPI.   Physical Exam:   Vital Signs: BP (!) 137/52   Pulse 68   Ht '5\' 2"'$  (1.575 m)   Wt 151  lb (68.5 kg)   BMI 27.62 kg/m  GENERAL: well appearing,in no acute distress,alert SKIN:  Color, texture, turgor normal. No rashes or lesions HEAD:  Normocephalic/atraumatic. CV:  RRR RESP: Normal respiratory effort MSK: +tenderness to palpation over right occiput, neck, and shoulders. Very limited range of motion turning head left and right  NEUROLOGICAL: Mental Status: Alert, oriented to person, place and time,Follows commands Cranial Nerves: PERRL, visual fields intact to confrontation, extraocular movements intact, facial sensation intact, no facial droop or ptosis, hearing grossly intact, no dysarthria Motor: muscle strength 5/5 both upper and lower extremities,no drift, normal tone Reflexes: 2+ throughout Sensation: intact to light touch all 4 extremities Coordination: Finger-to- nose-finger intact bilaterally Gait: walks with a cane   IMPRESSION: 87 year old female with a history of afib, traumatic ICH with IVH, CAD, HTN who presents for evaluation of headaches following a head trauma with ICH/IVH. Her headache pattern is most consistent with right occipital neuralgia. She denies radicular symptoms in her upper extremities. Will refer to neck PT for cervicalgia/occipital neuralgia. Discussed further treatment options including occipital nerve block and gabapentin QHS. She would prefer to try PT first, then will consider alternative treatment if needed.  PLAN: -Referral to neck PT -Next steps: consider occipital nerve block, low does gabapentin   I spent a total of 34 minutes chart reviewing and counseling the patient. Headache education was done. Discussed treatment options including preventive medications and physical therapy. Discussed medication side effects, adverse reactions and drug interactions. Written educational materials and patient instructions outlining all of the above were given.  Follow-up: 4 months   Genia Harold, MD 02/26/2022   2:29 PM

## 2022-03-05 NOTE — Telephone Encounter (Signed)
Pt's husband reports that he checked with [facility] on yesterday and today, they have not received the referral as of yet, please call.

## 2022-03-05 NOTE — Telephone Encounter (Signed)
Resent referral to [facility], phone # [phone].

## 2022-03-10 ENCOUNTER — Ambulatory Visit (INDEPENDENT_AMBULATORY_CARE_PROVIDER_SITE_OTHER): Payer: Medicare Other

## 2022-03-10 DIAGNOSIS — R55 Syncope and collapse: Secondary | ICD-10-CM

## 2022-03-11 LAB — CUP PACEART REMOTE DEVICE CHECK
Date Time Interrogation Session: 20231210230706
Implantable Pulse Generator Implant Date: 20210111

## 2022-03-11 NOTE — Progress Notes (Signed)
Carelink Summary Report / Loop Recorder 

## 2022-03-28 DIAGNOSIS — M542 Cervicalgia: Secondary | ICD-10-CM | POA: Diagnosis not present

## 2022-03-28 DIAGNOSIS — M6281 Muscle weakness (generalized): Secondary | ICD-10-CM | POA: Diagnosis not present

## 2022-04-01 DIAGNOSIS — M542 Cervicalgia: Secondary | ICD-10-CM | POA: Diagnosis not present

## 2022-04-01 DIAGNOSIS — M6281 Muscle weakness (generalized): Secondary | ICD-10-CM | POA: Diagnosis not present

## 2022-04-10 ENCOUNTER — Other Ambulatory Visit: Payer: Self-pay | Admitting: Cardiovascular Disease

## 2022-04-14 ENCOUNTER — Ambulatory Visit (INDEPENDENT_AMBULATORY_CARE_PROVIDER_SITE_OTHER): Payer: Medicare Other

## 2022-04-14 DIAGNOSIS — R55 Syncope and collapse: Secondary | ICD-10-CM | POA: Diagnosis not present

## 2022-04-15 DIAGNOSIS — M6281 Muscle weakness (generalized): Secondary | ICD-10-CM | POA: Diagnosis not present

## 2022-04-15 DIAGNOSIS — M542 Cervicalgia: Secondary | ICD-10-CM | POA: Diagnosis not present

## 2022-04-16 LAB — CUP PACEART REMOTE DEVICE CHECK
Date Time Interrogation Session: 20240112230714
Implantable Pulse Generator Implant Date: 20210111

## 2022-04-17 DIAGNOSIS — M542 Cervicalgia: Secondary | ICD-10-CM | POA: Diagnosis not present

## 2022-04-17 DIAGNOSIS — M6281 Muscle weakness (generalized): Secondary | ICD-10-CM | POA: Diagnosis not present

## 2022-04-17 NOTE — Progress Notes (Signed)
Carelink Summary Report / Loop Recorder

## 2022-04-21 DIAGNOSIS — M542 Cervicalgia: Secondary | ICD-10-CM | POA: Diagnosis not present

## 2022-04-21 DIAGNOSIS — M6281 Muscle weakness (generalized): Secondary | ICD-10-CM | POA: Diagnosis not present

## 2022-04-29 ENCOUNTER — Telehealth: Payer: Self-pay

## 2022-04-29 NOTE — Telephone Encounter (Signed)
Notification from [employer]:  ILR alert for AF, 6hrs on 04/28/22 with avg rates 120's.  [place] appears to be contraindicated due to IVH with watchman referral pending.  Routing for further review/fyi- JJB ILR implant for syncope  I called patient to check on her sx's.  She states that she actually woke up sweating and shaking, felt like she had a panic attack around that time (3:45am yesterday am).   Denies any dizziness or light-headedness States she has been having very vivid, scary dreams so thought that was all it was, but she clearly felt different when this occurred.  Patient leaving to go out of town on a cruise until 05/11/22.   Patient is aware that I will forward to Dr. Sallyanne Kuster for further follow up.

## 2022-04-30 DIAGNOSIS — M542 Cervicalgia: Secondary | ICD-10-CM | POA: Diagnosis not present

## 2022-04-30 DIAGNOSIS — M6281 Muscle weakness (generalized): Secondary | ICD-10-CM | POA: Diagnosis not present

## 2022-04-30 NOTE — Telephone Encounter (Signed)
Her burden of atrial fibrillation remains very low, despite the fact that she had the 6-hour episode.  I do not think antiarrhythmics are indicated. Although she is at increased risk for embolic stroke, she has had very serious consequences from falls while on anticoagulation, including intra cerebral hemorrhage.  In my opinion, the risk of serious bleeding complications outweighs the risk of an embolic stroke.  Will not plan to restart anticoagulation unless there are new neurological events.

## 2022-05-01 NOTE — Telephone Encounter (Signed)
Notified patient of Dr. Victorino December comments below. She verbalizes understanding and thanks for the follow up.

## 2022-05-06 ENCOUNTER — Encounter (INDEPENDENT_AMBULATORY_CARE_PROVIDER_SITE_OTHER): Payer: Medicare Other | Admitting: Ophthalmology

## 2022-05-18 LAB — CUP PACEART REMOTE DEVICE CHECK
Date Time Interrogation Session: 20240214230510
Implantable Pulse Generator Implant Date: 20210111

## 2022-05-19 ENCOUNTER — Ambulatory Visit (INDEPENDENT_AMBULATORY_CARE_PROVIDER_SITE_OTHER): Payer: Medicare Other

## 2022-05-19 DIAGNOSIS — R55 Syncope and collapse: Secondary | ICD-10-CM

## 2022-06-02 ENCOUNTER — Ambulatory Visit: Payer: Medicare Other | Attending: Cardiovascular Disease | Admitting: Cardiovascular Disease

## 2022-06-02 ENCOUNTER — Encounter: Payer: Self-pay | Admitting: Cardiovascular Disease

## 2022-06-02 VITALS — BP 142/59 | HR 62 | Ht 62.0 in | Wt 149.4 lb

## 2022-06-02 DIAGNOSIS — I6523 Occlusion and stenosis of bilateral carotid arteries: Secondary | ICD-10-CM | POA: Diagnosis not present

## 2022-06-02 DIAGNOSIS — I10 Essential (primary) hypertension: Secondary | ICD-10-CM | POA: Insufficient documentation

## 2022-06-02 DIAGNOSIS — I2510 Atherosclerotic heart disease of native coronary artery without angina pectoris: Secondary | ICD-10-CM | POA: Insufficient documentation

## 2022-06-02 DIAGNOSIS — E7800 Pure hypercholesterolemia, unspecified: Secondary | ICD-10-CM | POA: Diagnosis not present

## 2022-06-02 DIAGNOSIS — Z95818 Presence of other cardiac implants and grafts: Secondary | ICD-10-CM | POA: Insufficient documentation

## 2022-06-02 DIAGNOSIS — I480 Paroxysmal atrial fibrillation: Secondary | ICD-10-CM | POA: Insufficient documentation

## 2022-06-02 DIAGNOSIS — Z8679 Personal history of other diseases of the circulatory system: Secondary | ICD-10-CM | POA: Diagnosis not present

## 2022-06-02 DIAGNOSIS — I350 Nonrheumatic aortic (valve) stenosis: Secondary | ICD-10-CM | POA: Diagnosis not present

## 2022-06-02 DIAGNOSIS — Z87898 Personal history of other specified conditions: Secondary | ICD-10-CM | POA: Insufficient documentation

## 2022-06-02 NOTE — Progress Notes (Signed)
Carelink Summary Report / Loop Recorder 

## 2022-06-02 NOTE — Patient Instructions (Signed)
Medication Instructions:  No changes *If you need a refill on your cardiac medications before your next appointment, please call your pharmacy*  Follow-Up: At [facility], you and your health needs are our priority.  As part of our continuing mission to provide you with exceptional heart care, we have created designated Provider Care Teams.  These Care Teams include your primary Cardiologist (physician) and Advanced Practice Providers (APPs -  Physician Assistants and Nurse Practitioners) who all work together to provide you with the care you need, when you need it.  We recommend signing up for the patient portal called "MyChart".  Sign up information is provided on this After Visit Summary.  MyChart is used to connect with patients for Virtual Visits (Telemedicine).  Patients are able to view lab/test results, encounter notes, upcoming appointments, etc.  Non-urgent messages can be sent to your provider as well.   To learn more about what you can do with MyChart, go to [email or web address].    Your next appointment:   6 month(s)  Provider:   Sanda Klein, MD

## 2022-06-03 ENCOUNTER — Encounter: Payer: Self-pay | Admitting: Cardiovascular Disease

## 2022-06-03 NOTE — Progress Notes (Signed)
Cardiology Office Note    Date:  06/03/2022   ID:  Melinda [name] Jan 12, 1935, MRN [id number]  PCP:  Melinda Jewel, MD  Cardiologist:   Melinda Klein, MD   Chief Complaint  Patient presents with   Atrial Fibrillation    History of Present Illness:  Melinda [name] is a 88 y.o. female with recent recurrent syncope leading to implantable loop recorder, history of paroxysmal atrial fibrillation, takotsubo cardiomyopathy with complete resolution, mild aortic valve stenosis, minor coronary artery disease with 75% stenosis of a first oblique marginal artery (asymptomatic). Last echo in October 2019 showed normal LVEF 55-60%.  Close hospitalized for severe intraventricular hemorrhage after a fall in August 2023.  She had a roughly 10-day hospital stay followed by a stent in skilled nursing.  Anticoagulation has been stopped since then.  She made a remarkable recovery.  She is walking without assistance is fully alert and oriented and coherent and Melinda dry humor is back in full display.  She has not had any new falls.  She has not had any episodes that would suggest TIA or stroke.  Melinda loop recorder interrogation did show an episode of atrial fibrillation that lasted about 6 hours in January.  It was well rate controlled and asymptomatic.  The overall burden of atrial fibrillation is well under 1%.  She has evidence of moderate multilevel degenerative disc disease and arthritis in Melinda spine.  A previous CT angiogram has shown relatively mild intracranial arterial atherosclerotic stenoses.  The patient specifically denies any chest pain at rest exertion, dyspnea at rest or with exertion, orthopnea, paroxysmal nocturnal dyspnea, syncope, palpitations, focal neurological deficits, intermittent claudication, lower extremity edema, unexplained weight gain, cough, hemoptysis or wheezing.  Melinda blood pressure is a little high when she first checked in today 164/60 but when I rechecked it  several minutes later was down to 142/59.  Melinda ECG shows normal sinus rhythm. Thank Melinda [name] has been diagnosed with breast cancer and has a genetic abnormality (Lynch syndrome) that increases the risk of cancer in other family members, so everybody is being tested.  On top of this Melinda son-in-law was diagnosed with a bone marrow malignancy and is undergoing bone marrow transplant.  Melinda husband Melinda [name] is having serious back problems.    Past Medical History:  Diagnosis Date   Allergy    Anemia 1984   before hysterectomy   Breast cancer ([place])    bilateral   GERD (gastroesophageal reflux disease)    History of blood product transfusion 1984   Hyperlipidemia    Hypertension    Macular degeneration    right eye   Myocardial infarction ([place])    Osteopenia    Personal history of radiation therapy    Takotsubo cardiomyopathy     Past Surgical History:  Procedure Laterality Date   ABDOMINAL HYSTERECTOMY  1984   APPENDECTOMY     BREAST BIOPSY Right 04/29/05   right    BREAST BIOPSY Left 2006   BREAST LUMPECTOMY Left 04/19/04   left with sentinel node   BREAST LUMPECTOMY Right 2007   CATARACT EXTRACTION     bilateral   EYE SURGERY     LEFT HEART CATHETERIZATION WITH CORONARY ANGIOGRAM N/A 07/26/2013   Procedure: LEFT HEART CATHETERIZATION WITH CORONARY ANGIOGRAM;  Surgeon: Jettie Booze, MD;  Location: [facility] CATH LAB;  Service: Cardiovascular;  Laterality: N/A;   RETINAL LASER PROCEDURE     right    Current Medications: Outpatient Medications  Prior to Visit  Medication Sig Dispense Refill   acetaminophen (TYLENOL) 500 MG tablet Take 1,000 mg by mouth every 8 (eight) hours as needed for mild pain (for pain, headaches, or fever).     amLODipine (NORVASC) 5 MG tablet TAKE 1 TABLET BY MOUTH DAILY 90 tablet 3   atorvastatin (LIPITOR) 20 MG tablet Take 20 mg by mouth at bedtime.     cholecalciferol 25 MCG (1000 UT) tablet Take 1,000 Units by mouth daily.     fexofenadine (ALLEGRA)  180 MG tablet Take 180 mg by mouth daily.     latanoprost (XALATAN) 0.005 % ophthalmic solution Place 1 drop into both eyes at bedtime.      losartan (COZAAR) 50 MG tablet TAKE TWO TABLETS BY MOUTH DAILY (Patient taking differently: Take 100 mg by mouth daily.) 180 tablet 3   metoprolol succinate (TOPROL-XL) 50 MG 24 hr tablet Take 50 mg by mouth daily.     Multiple Vitamins-Minerals (PRESERVISION AREDS 2+MULTI VIT) CAPS Take 1 tablet by mouth daily.     Nutritional Supplements (COLON FORMULA) 166.67-500 MG CAPS Take 1 capsule by mouth daily.     sertraline (ZOLOFT) 25 MG tablet Take 25 mg by mouth daily.     timolol (TIMOPTIC) 0.5 % ophthalmic solution Place 1 drop into the right eye every morning.      albuterol (VENTOLIN HFA) 108 (90 Base) MCG/ACT inhaler Inhale 1-2 puffs into the lungs every 6 (six) hours as needed for wheezing or shortness of breath. (Patient not taking: Reported on 06/02/2022)     nitroGLYCERIN (NITROSTAT) 0.4 MG SL tablet Place 1 tablet (0.4 mg total) under the tongue every 5 (five) minutes as needed for chest pain. (Patient not taking: Reported on 06/02/2022) 25 tablet 3   ondansetron (ZOFRAN) 4 MG tablet Take 4 mg by mouth every 8 (eight) hours as needed for nausea/vomiting. (Patient not taking: Reported on 06/02/2022)     Facility-Administered Medications Prior to Visit  Medication Dose Route Frequency Provider Last Rate Last Admin   lidocaine-EPINEPHrine (XYLOCAINE W/EPI) 1 %-1:100000 (with pres) injection 10 mL  10 mL Infiltration Once Kristoff Coonradt, MD         Allergies:   Nsaids, Salicylates, Codeine, Etodolac, and Hydrochlorothiazide   Social History   Socioeconomic History   Marital status: Married    Spouse name: Melinda [name]   Number of children: 2   Years of education: college   Highest education level: Not on file  Occupational History   Not on file  Tobacco Use   Smoking status: Former    Types: Cigarettes    Quit date: 1990    Years since quitting: 34.1    Smokeless tobacco: Never  Vaping Use   Vaping Use: Never used  Substance and Sexual Activity   Alcohol use: Yes    Comment: 3 drinks per night, "too much"   Drug use: No   Sexual activity: Not on file  Other Topics Concern   Not on file  Social History Narrative   Lives with Spouse and dog   Drinks 3 cups of caffeine daily   Right handed             Social Determinants of Health   Financial Resource Strain: Not on file  Food Insecurity: Not on file  Transportation Needs: Not on file  Physical Activity: Not on file  Stress: Not on file  Social Connections: Not on file     Family History:  The patient's family history  includes Breast cancer in Melinda maternal aunt and maternal grandmother; Cancer in Melinda father, maternal aunt, and maternal grandmother; Dementia in Melinda mother; Heart attack in Melinda maternal grandfather and paternal grandfather; Heart disease (age of onset: 36) in Melinda father; Heart disease (age of onset: 78) in Melinda mother; Stroke in Melinda mother.   ROS:   Please see the history of present illness.    All other systems are reviewed and are negative.   PHYSICAL EXAM:   VS:  BP (!) 142/59   Pulse 62   Ht '5\' 2"'$  (1.575 m)   Wt 149 lb 6.4 oz (67.8 kg)   SpO2 96%   BMI 27.33 kg/m      General: Alert, oriented x3, no distress, loop recorder site looks healthy Head: no evidence of trauma, PERRL, EOMI, no exophtalmos or lid lag, no myxedema, no xanthelasma; normal ears, nose and oropharynx Neck: normal jugular venous pulsations and no hepatojugular reflux; brisk carotid pulses without delay and no carotid bruits Chest: clear to auscultation, no signs of consolidation by percussion or palpation, normal fremitus, symmetrical and full respiratory excursions Cardiovascular: normal position and quality of the apical impulse, regular rhythm, normal first and second heart sounds, no murmurs, rubs or gallops Abdomen: no tenderness or distention, no masses by palpation, no  abnormal pulsatility or arterial bruits, normal bowel sounds, no hepatosplenomegaly Extremities: no clubbing, cyanosis or edema; 2+ radial, ulnar and brachial pulses bilaterally; 2+ right femoral, posterior tibial and dorsalis pedis pulses; 2+ left femoral, posterior tibial and dorsalis pedis pulses; no subclavian or femoral bruits Neurological: grossly nonfocal Psych: Normal mood and affect    Wt Readings from Last 3 Encounters:  06/02/22 149 lb 6.4 oz (67.8 kg)  02/26/22 151 lb (68.5 kg)  01/30/22 152 lb 3.2 oz (69 kg)      Studies/Labs Reviewed:   ECHO 11/12/2021:   1. Left ventricular ejection fraction, by estimation, is 55 to 60%. The  left ventricle has normal function. The left ventricle has no regional  wall motion abnormalities. There is mild concentric left ventricular  hypertrophy. Left ventricular diastolic  parameters are consistent with Grade I diastolic dysfunction (impaired  relaxation).   2. Right ventricular systolic function is normal. The right ventricular  size is normal. There is normal pulmonary artery systolic pressure. The  estimated right ventricular systolic pressure is 0000000 mmHg.   3. The mitral valve is grossly normal. Trivial mitral valve  regurgitation. No evidence of mitral stenosis.   4. The aortic valve is tricuspid. There is moderate calcification of the  aortic valve. There is moderate thickening of the aortic valve. Aortic  valve regurgitation is mild to moderate. Mild aortic valve stenosis.  Aortic valve area, by VTI measures 1.57  cm. Aortic valve mean gradient measures 12.0 mmHg. Aortic valve Vmax  measures 2.39 m/s.   5. The inferior vena cava is normal in size with greater than 50%  respiratory variability, suggesting right atrial pressure of 3 mmHg.   Comparison(s): No significant change from prior study.   EKG:  EKG is ordered today shows normal sinus rhythm, completely normal tracing, QTc 440 ms  Loop recorder downloads  reviewed  Recent Labs: 06/13/2019 hemoglobin A1c 5.3%, creatinine 0.57, potassium 4.0, normal liver function tests 06/14/2020 creatinine 0.74, potassium 3.9, normal liver function tests, hemoglobin A1c 5.4%  06/24/2021 Hemoglobin A1c 5.5%, creatinine 0.71, potassium 4.0, hemoglobin 13.9, ALT 11  12/26/2021 Creatinine 0.55, potassium 3.2, ALT 14, hemoglobin 14.8  Lipid Panel  Component Value Date/Time   CHOL 159 07/26/2013 0718   TRIG 86 07/26/2013 0718   HDL 89 07/26/2013 0718   CHOLHDL 1.8 07/26/2013 0718   VLDL 17 07/26/2013 0718   LDLCALC 53 07/26/2013 0718  12/02/2018 Total cholesterol 176, HDL 80, LDL 81, triglycerides 79, hemoglobin A1c 5.4% 06/13/2019 Total cholesterol 181, HDL 76, LDL 81, triglycerides 80 06/14/2020 Cholesterol 187, HDL 77, LDL 89, triglycerides 117 12/26/2021 1265, HDL 65, LDL 81, triglycerides 111  ASSESSMENT:    1. Paroxysmal atrial fibrillation (HCC)   2. History of syncope   3. Coronary artery disease involving native coronary artery of native heart without angina pectoris   4. Aortic valve stenosis, nonrheumatic   5. Hypercholesterolemia   6. Essential hypertension   7. Status post placement of implantable loop recorder   8. Stenosis of intracranial portion of both internal carotid arteries   9. History of intracerebral hemorrhage without residual deficit       PLAN:  In order of problems listed above:  AFib: The prevalence of arrhythmia is extremely low, 1 or 2 episodes a year lasting for a few hours each.  More commonly she has episodes of sustained atrial tachycardia, usually brief.  CHADSVasc 5 (age 88, gender, HTN, CAD), but the risk of bleeding including intracerebral hemorrhage after falls has clearly outweighed the risk of ischemic stroke.  Will keep Melinda off anticoagulation unless there is a marked increase in the burden of atrial fibrillation or unless she has clear evidence of cerebral or systemic embolism..  Syncope: No syncope  and no falls since last August.  No meaningful arrhythmia has ever been detected at the time of Melinda presyncopal syncopal events since we implanted a loop recorder.   CAD: Remains a send mag. Moderate CAD was discovered incidentally when she had coronary angiography for takotsubo syndrome.  Mild AS: Not yet hemodynamically significant.  Murmur is quite loud, but the second heart sound is very distinct and the carotid pulses are brisk.  The mean gradient last August was only 12 mmHg. HLP: Excellent HDL, acceptable LDL (would prefer less than 70) HTN: Will tolerate systolic blood pressure [id number] since she has low diastolic blood pressure, history of falls related to orthostatic hypotension.  No changes made to Melinda medications today. ILR: Continue to monitor the atrial arrhythmia. Intracranial atherosclerosis: No severe stenoses, mild bilateral intracranial stenoses. History of intraventricular cerebral hemorrhage August 2023.   Medication Adjustments/Labs and Tests Ordered: Current medicines are reviewed at length with the patient today.  Concerns regarding medicines are outlined above.  Medication changes, Labs and Tests ordered today are listed in the Patient Instructions below. Patient Instructions  Medication Instructions:  No changes *If you need a refill on your cardiac medications before your next appointment, please call your pharmacy*  Follow-Up: At [facility], you and your health needs are our priority.  As part of our continuing mission to provide you with exceptional heart care, we have created designated Provider Care Teams.  These Care Teams include your primary Cardiologist (physician) and Advanced Practice Providers (APPs -  Physician Assistants and Nurse Practitioners) who all work together to provide you with the care you need, when you need it.  We recommend signing up for the patient portal called "MyChart".  Sign up information is provided on this After Visit  Summary.  MyChart is used to connect with patients for Virtual Visits (Telemedicine).  Patients are able to view lab/test results, encounter notes, upcoming appointments, etc.  Non-urgent messages  can be sent to your provider as well.   To learn more about what you can do with MyChart, go to [email or web address].    Your next appointment:   6 month(s)  Provider:   Sanda Klein, MD       Signed, Melinda Klein, MD  06/03/2022 5:32 PM    [place], [place], [place]  [address] Phone: [phone]; Fax: [phone]

## 2022-06-18 DIAGNOSIS — H353114 Nonexudative age-related macular degeneration, right eye, advanced atrophic with subfoveal involvement: Secondary | ICD-10-CM | POA: Diagnosis not present

## 2022-06-18 DIAGNOSIS — H353133 Nonexudative age-related macular degeneration, bilateral, advanced atrophic without subfoveal involvement: Secondary | ICD-10-CM | POA: Diagnosis not present

## 2022-06-18 DIAGNOSIS — Z9889 Other specified postprocedural states: Secondary | ICD-10-CM | POA: Diagnosis not present

## 2022-06-21 LAB — CUP PACEART REMOTE DEVICE CHECK
Date Time Interrogation Session: 20240318230550
Implantable Pulse Generator Implant Date: 20210111

## 2022-06-23 ENCOUNTER — Ambulatory Visit (INDEPENDENT_AMBULATORY_CARE_PROVIDER_SITE_OTHER): Payer: Medicare Other

## 2022-06-23 DIAGNOSIS — R55 Syncope and collapse: Secondary | ICD-10-CM

## 2022-06-26 NOTE — Progress Notes (Signed)
Referring:  Mckinley Jewel, MD [address] [place],  [place] [address]  PCP: Mckinley Jewel, MD    CC:  headaches Chief Complaint  Patient presents with   Follow-up    Patient in room #3 with her husband. Patient states her head doesn't hurt as much but she doing well.    History provided from self, spouse Herbie Baltimore  Follow-up visit:  Prior visit: 02/26/2022 with Dr. Billey Gosling (initial consult visit)   Brief HPI:   Melinda [name] is a 88 y.o. female who is being followed for headaches after a fall c/b traumatic ICH with IVH in 10/2021, felt headaches consistent with right-sided occipital neuralgia.  At prior visit, she was referred for neck PT   Interval history:  Reports some improvement of daily headaches since prior visit. Still has right sided pain but not as severe nor bothersome especially when distracted, can be bothersome occasionally at night but does not interfere with sleep.  Did complete PT and has since been discharged.    Headache History: Headache days per month: 30 Headache free days per month: 0  Current Treatment: Abortive Tylenol  Preventative none  Prior Therapies                                 Losartan 100 mg daily Metoprolol 50 mg daily Zoloft 25 mg daily   LABS: CBC    Component Value Date/Time   WBC 9.7 11/12/2021 0106   RBC 4.31 11/12/2021 0106   HGB 14.8 11/12/2021 0106   HGB 12.5 08/06/2010 1554   HCT 42.0 11/12/2021 0106   HCT 36.8 08/06/2010 1554   PLT 271 11/12/2021 0106   PLT 249 08/06/2010 1554   MCV 97.4 11/12/2021 0106   MCV 98.1 08/06/2010 1554   MCH 34.3 (H) 11/12/2021 0106   MCHC 35.2 11/12/2021 0106   RDW 11.6 11/12/2021 0106   RDW 12.4 08/06/2010 1554   LYMPHSABS 1.4 05/07/2021 1206   LYMPHSABS 1.6 08/06/2010 1554   MONOABS 0.7 05/07/2021 1206   MONOABS 0.4 08/06/2010 1554   EOSABS 0.1 05/07/2021 1206   EOSABS 0.0 08/06/2010 1554   BASOSABS 0.0 05/07/2021 1206   BASOSABS 0.0  08/06/2010 1554      Latest Ref Rng & Units 12/09/2021    3:47 PM 11/20/2021   12:56 PM 11/19/2021    7:59 AM  CMP  Glucose 70 - 99 mg/dL 93  116  113   BUN 8 - 27 mg/dL 5  6  7    Creatinine 0.57 - 1.00 mg/dL 0.44  0.52  0.54   Sodium 134 - 144 mmol/L 140  130  130   Potassium 3.5 - 5.2 mmol/L 3.3  3.0  2.9   Chloride 96 - 106 mmol/L 92  94  92   CO2 20 - 29 mmol/L 29  28  27    Calcium 8.7 - 10.3 mg/dL 9.5  8.0  7.9      IMAGING:  CTH 11/11/21: 1. Small area of parenchymal hemorrhage in the left temporal lobe without significant mass effect or midline shift. 2. Intraventricular hemorrhage, most prominently in the left lateral ventricle, but also in the right lateral ventricle, third ventricle, and fourth ventricle. Possible minimal enlargement of the lateral ventricles without frank hydrocephalus. 3. Possible small additional amount of subarachnoid hemorrhage in the left temporal lobe.  [facility] 12/20/21: Resolution of the intraventricular blood products in the  left lateral ventricle since 11/19/2021. No change in ventricular size.  CT C-spine 11/11/21: moderate left foraminal stenosis C3-4, C4-5, C5-6  Imaging independently reviewed on June 30, 2022   Current Outpatient Medications on File Prior to Visit  Medication Sig Dispense Refill   acetaminophen (TYLENOL) 500 MG tablet Take 1,000 mg by mouth every 8 (eight) hours as needed for mild pain (for pain, headaches, or fever).     amLODipine (NORVASC) 5 MG tablet TAKE 1 TABLET BY MOUTH DAILY 90 tablet 3   atorvastatin (LIPITOR) 20 MG tablet Take 20 mg by mouth at bedtime.     cholecalciferol 25 MCG (1000 UT) tablet Take 1,000 Units by mouth daily.     fexofenadine (ALLEGRA) 180 MG tablet Take 180 mg by mouth daily.     latanoprost (XALATAN) 0.005 % ophthalmic solution Place 1 drop into both eyes at bedtime.      losartan (COZAAR) 50 MG tablet TAKE TWO TABLETS BY MOUTH DAILY (Patient taking differently: Take 100 mg by mouth daily.)  180 tablet 3   metoprolol succinate (TOPROL-XL) 50 MG 24 hr tablet Take 50 mg by mouth daily.     Multiple Vitamins-Minerals (PRESERVISION AREDS 2+MULTI VIT) CAPS Take 1 tablet by mouth daily.     sertraline (ZOLOFT) 25 MG tablet Take 25 mg by mouth daily.     timolol (TIMOPTIC) 0.5 % ophthalmic solution Place 1 drop into the right eye every morning.      albuterol (VENTOLIN HFA) 108 (90 Base) MCG/ACT inhaler Inhale 1-2 puffs into the lungs every 6 (six) hours as needed for wheezing or shortness of breath. (Patient not taking: Reported on 06/02/2022)     nitroGLYCERIN (NITROSTAT) 0.4 MG SL tablet Place 1 tablet (0.4 mg total) under the tongue every 5 (five) minutes as needed for chest pain. (Patient not taking: Reported on 06/02/2022) 25 tablet 3   Nutritional Supplements (COLON FORMULA) 166.67-500 MG CAPS Take 1 capsule by mouth daily. (Patient not taking: Reported on 06/30/2022)     Current Facility-Administered Medications on File Prior to Visit  Medication Dose Route Frequency Provider Last Rate Last Admin   lidocaine-EPINEPHrine (XYLOCAINE W/EPI) 1 %-1:100000 (with pres) injection 10 mL  10 mL Infiltration Once Croitoru, Mihai, MD         Allergies: Allergies  Allergen Reactions   Nsaids Hives, Shortness Of Breath and Other (See Comments)    Extreme hives and difficulty breathing. "Has had to go to the ER."   Salicylates Other (See Comments)    (aspirin) Extreme hives and difficulty breathing. "Has had to go to the ER."   Codeine Nausea Only   Etodolac Rash   Hydrochlorothiazide Other (See Comments)    Hyponatremia HCTZ    Family History: Migraine or other headaches in the family:  no Aneurysms in a first degree relative:  no Brain tumors in the family:  no Other neurological illness in the family:   no  Past Medical History: Past Medical History:  Diagnosis Date   Allergy    Anemia 1984   before hysterectomy   Breast cancer    bilateral   GERD (gastroesophageal reflux  disease)    History of blood product transfusion 1984   Hyperlipidemia    Hypertension    Macular degeneration    right eye   Myocardial infarction    Osteopenia    Personal history of radiation therapy    Takotsubo cardiomyopathy     Past Surgical History Past Surgical History:  Procedure Laterality  Date   ABDOMINAL HYSTERECTOMY  1984   APPENDECTOMY     BREAST BIOPSY Right 04/29/05   right    BREAST BIOPSY Left 2006   BREAST LUMPECTOMY Left 04/19/04   left with sentinel node   BREAST LUMPECTOMY Right 2007   CATARACT EXTRACTION     bilateral   EYE SURGERY     LEFT HEART CATHETERIZATION WITH CORONARY ANGIOGRAM N/A 07/26/2013   Procedure: LEFT HEART CATHETERIZATION WITH CORONARY ANGIOGRAM;  Surgeon: Jettie Booze, MD;  Location[facility] C[facility] LAB;  Service: Cardiovascular;  Laterality: N/A;   RETINAL LASER PROCEDURE     right    Social History: Social History   Tobacco Use   Smoking status: Former    Types: Cigarettes    Quit date: 1990    Years since quitting: 34.2   Smokeless tobacco: Never  Vaping Use   Vaping Use: Never used  Substance Use Topics   Alcohol use: Yes    Comment: 3 drinks per night, "too much"   Drug use: No    ROS: Negative for fevers, chills. Positive for headaches. All other systems reviewed and negative unless stated otherwise in HPI.   Physical Exam:   Vital Signs: BP (!) 129/49 (BP Location: Right Arm, Patient Position: Sitting, Cuff Size: Small)   Pulse 70   Ht 5' (1.524 m)   Wt 151 lb 3.2 oz (68.6 kg)   BMI 29.53 kg/m  GENERAL: well appearing,in no acute distress,alert SKIN:  Color, texture, turgor normal. No rashes or lesions HEAD:  Normocephalic/atraumatic. CV:  RRR RESP: Normal respiratory effort MSK: +tenderness to palpation over right occiput, neck, and shoulders. Very limited range of motion turning head left and right  NEUROLOGICAL: Mental Status: Alert, oriented to person, place and time,Follows commands Cranial  Nerves: PERRL, visual fields intact to confrontation, extraocular movements intact, facial sensation intact, no facial droop or ptosis, hearing grossly intact, no dysarthria Motor: muscle strength 5/5 both upper and lower extremities,no drift, normal tone Reflexes: 2+ throughout Sensation: intact to light touch all 4 extremities Coordination: Finger-to- nose-finger intact bilaterally Gait: walks with a cane    IMPRESSION: 88 year old female with a history of afib, traumatic ICH with IVH, CAD, HTN who presents for follow-up of headaches following a head trauma with ICH/IVH. Her headache pattern is most consistent with right occipital neuralgia.  Gradual improvement of symptoms and currently not overly bothersome.    PLAN: -Discussed initiating preventative therapy but declines interest at this time, advised to call if symptoms should worsen or if she wishes to trial medications such as gabapentin   No further recommendations at this time, follow up as needed. Patient and husband agree with plan.      I spent 21 minutes of face-to-face and non-face-to-face time with patient and husband.  This included previsit chart review, lab review, study review, order entry, electronic health record documentation, patient education and discussion regarding the above and answered all other questions to patient and husband satisfaction  Frann Rider, AGNP-BC  [facility] [address] [place], [place] [address]  Phone [phone] Fax [phone] Note: This document was prepared with digital dictation and possible smart phrase technology. Any transcriptional errors that result from this process are unintentional.

## 2022-06-30 ENCOUNTER — Ambulatory Visit (INDEPENDENT_AMBULATORY_CARE_PROVIDER_SITE_OTHER): Payer: Medicare Other | Admitting: Adult Health

## 2022-06-30 ENCOUNTER — Encounter: Payer: Self-pay | Admitting: Adult Health

## 2022-06-30 VITALS — BP 129/49 | HR 70 | Ht 60.0 in | Wt 151.2 lb

## 2022-06-30 DIAGNOSIS — M5481 Occipital neuralgia: Secondary | ICD-10-CM

## 2022-06-30 NOTE — Patient Instructions (Signed)
Your Plan:  Please let me know if you would like to try any medications for head pain or if anything should worsen     Follow up as needed at this time     Thank you for coming to see [place] at [facility]. I hope we have been able to provide you high quality care today.  You may receive a patient satisfaction survey over the next few weeks. We would appreciate your feedback and comments so that we may continue to improve ourselves and the health of our patients.

## 2022-07-01 DIAGNOSIS — I119 Hypertensive heart disease without heart failure: Secondary | ICD-10-CM | POA: Diagnosis not present

## 2022-07-01 DIAGNOSIS — D6869 Other thrombophilia: Secondary | ICD-10-CM | POA: Diagnosis not present

## 2022-07-01 DIAGNOSIS — I2510 Atherosclerotic heart disease of native coronary artery without angina pectoris: Secondary | ICD-10-CM | POA: Diagnosis not present

## 2022-07-01 DIAGNOSIS — M8588 Other specified disorders of bone density and structure, other site: Secondary | ICD-10-CM | POA: Diagnosis not present

## 2022-07-01 DIAGNOSIS — K529 Noninfective gastroenteritis and colitis, unspecified: Secondary | ICD-10-CM | POA: Diagnosis not present

## 2022-07-01 DIAGNOSIS — H35033 Hypertensive retinopathy, bilateral: Secondary | ICD-10-CM | POA: Diagnosis not present

## 2022-07-01 DIAGNOSIS — I4891 Unspecified atrial fibrillation: Secondary | ICD-10-CM | POA: Diagnosis not present

## 2022-07-01 DIAGNOSIS — F39 Unspecified mood [affective] disorder: Secondary | ICD-10-CM | POA: Diagnosis not present

## 2022-07-01 DIAGNOSIS — E7800 Pure hypercholesterolemia, unspecified: Secondary | ICD-10-CM | POA: Diagnosis not present

## 2022-07-01 DIAGNOSIS — Z8679 Personal history of other diseases of the circulatory system: Secondary | ICD-10-CM | POA: Diagnosis not present

## 2022-07-02 NOTE — Progress Notes (Signed)
Carelink Summary Report / Loop Recorder 

## 2022-07-07 DIAGNOSIS — H43812 Vitreous degeneration, left eye: Secondary | ICD-10-CM | POA: Diagnosis not present

## 2022-07-07 DIAGNOSIS — H353123 Nonexudative age-related macular degeneration, left eye, advanced atrophic without subfoveal involvement: Secondary | ICD-10-CM | POA: Diagnosis not present

## 2022-07-07 DIAGNOSIS — H353114 Nonexudative age-related macular degeneration, right eye, advanced atrophic with subfoveal involvement: Secondary | ICD-10-CM | POA: Diagnosis not present

## 2022-07-07 DIAGNOSIS — H353212 Exudative age-related macular degeneration, right eye, with inactive choroidal neovascularization: Secondary | ICD-10-CM | POA: Diagnosis not present

## 2022-07-07 DIAGNOSIS — H401131 Primary open-angle glaucoma, bilateral, mild stage: Secondary | ICD-10-CM | POA: Diagnosis not present

## 2022-07-23 DIAGNOSIS — H401131 Primary open-angle glaucoma, bilateral, mild stage: Secondary | ICD-10-CM | POA: Diagnosis not present

## 2022-07-23 DIAGNOSIS — H353212 Exudative age-related macular degeneration, right eye, with inactive choroidal neovascularization: Secondary | ICD-10-CM | POA: Diagnosis not present

## 2022-07-23 DIAGNOSIS — H353123 Nonexudative age-related macular degeneration, left eye, advanced atrophic without subfoveal involvement: Secondary | ICD-10-CM | POA: Diagnosis not present

## 2022-07-23 DIAGNOSIS — H353114 Nonexudative age-related macular degeneration, right eye, advanced atrophic with subfoveal involvement: Secondary | ICD-10-CM | POA: Diagnosis not present

## 2022-07-23 DIAGNOSIS — H43812 Vitreous degeneration, left eye: Secondary | ICD-10-CM | POA: Diagnosis not present

## 2022-07-28 ENCOUNTER — Ambulatory Visit (INDEPENDENT_AMBULATORY_CARE_PROVIDER_SITE_OTHER): Payer: Medicare Other

## 2022-07-28 DIAGNOSIS — R55 Syncope and collapse: Secondary | ICD-10-CM

## 2022-07-28 LAB — CUP PACEART REMOTE DEVICE CHECK
Date Time Interrogation Session: 20240428230354
Implantable Pulse Generator Implant Date: 20210111

## 2022-08-04 NOTE — Progress Notes (Signed)
Carelink Summary Report / Loop Recorder 

## 2022-08-06 DIAGNOSIS — H35373 Puckering of macula, bilateral: Secondary | ICD-10-CM | POA: Diagnosis not present

## 2022-08-06 DIAGNOSIS — H353212 Exudative age-related macular degeneration, right eye, with inactive choroidal neovascularization: Secondary | ICD-10-CM | POA: Diagnosis not present

## 2022-08-06 DIAGNOSIS — H353123 Nonexudative age-related macular degeneration, left eye, advanced atrophic without subfoveal involvement: Secondary | ICD-10-CM | POA: Diagnosis not present

## 2022-08-06 DIAGNOSIS — H401131 Primary open-angle glaucoma, bilateral, mild stage: Secondary | ICD-10-CM | POA: Diagnosis not present

## 2022-08-26 DIAGNOSIS — H353212 Exudative age-related macular degeneration, right eye, with inactive choroidal neovascularization: Secondary | ICD-10-CM | POA: Diagnosis not present

## 2022-08-26 DIAGNOSIS — H401131 Primary open-angle glaucoma, bilateral, mild stage: Secondary | ICD-10-CM | POA: Diagnosis not present

## 2022-08-26 DIAGNOSIS — H43812 Vitreous degeneration, left eye: Secondary | ICD-10-CM | POA: Diagnosis not present

## 2022-08-26 DIAGNOSIS — H353123 Nonexudative age-related macular degeneration, left eye, advanced atrophic without subfoveal involvement: Secondary | ICD-10-CM | POA: Diagnosis not present

## 2022-08-26 DIAGNOSIS — H353114 Nonexudative age-related macular degeneration, right eye, advanced atrophic with subfoveal involvement: Secondary | ICD-10-CM | POA: Diagnosis not present

## 2022-08-29 NOTE — Progress Notes (Signed)
Carelink Summary Report / Loop Recorder 

## 2022-09-01 ENCOUNTER — Ambulatory Visit (INDEPENDENT_AMBULATORY_CARE_PROVIDER_SITE_OTHER): Payer: Medicare Other

## 2022-09-01 DIAGNOSIS — R55 Syncope and collapse: Secondary | ICD-10-CM

## 2022-09-01 LAB — CUP PACEART REMOTE DEVICE CHECK
Date Time Interrogation Session: 20240602230419
Implantable Pulse Generator Implant Date: 20210111

## 2022-09-13 ENCOUNTER — Other Ambulatory Visit: Payer: Self-pay | Admitting: Cardiovascular Disease

## 2022-09-18 DIAGNOSIS — R197 Diarrhea, unspecified: Secondary | ICD-10-CM | POA: Diagnosis not present

## 2022-09-23 NOTE — Progress Notes (Signed)
Carelink Summary Report / Loop Recorder 

## 2022-10-06 ENCOUNTER — Ambulatory Visit (INDEPENDENT_AMBULATORY_CARE_PROVIDER_SITE_OTHER): Payer: Medicare Other

## 2022-10-06 DIAGNOSIS — R55 Syncope and collapse: Secondary | ICD-10-CM

## 2022-10-06 LAB — CUP PACEART REMOTE DEVICE CHECK
Date Time Interrogation Session: 20240705230025
Implantable Pulse Generator Implant Date: 20210111

## 2022-10-21 DIAGNOSIS — Z9889 Other specified postprocedural states: Secondary | ICD-10-CM | POA: Diagnosis not present

## 2022-10-21 DIAGNOSIS — H353123 Nonexudative age-related macular degeneration, left eye, advanced atrophic without subfoveal involvement: Secondary | ICD-10-CM | POA: Diagnosis not present

## 2022-10-21 DIAGNOSIS — H43812 Vitreous degeneration, left eye: Secondary | ICD-10-CM | POA: Diagnosis not present

## 2022-10-21 DIAGNOSIS — H353114 Nonexudative age-related macular degeneration, right eye, advanced atrophic with subfoveal involvement: Secondary | ICD-10-CM | POA: Diagnosis not present

## 2022-10-21 DIAGNOSIS — H401131 Primary open-angle glaucoma, bilateral, mild stage: Secondary | ICD-10-CM | POA: Diagnosis not present

## 2022-10-21 DIAGNOSIS — H353212 Exudative age-related macular degeneration, right eye, with inactive choroidal neovascularization: Secondary | ICD-10-CM | POA: Diagnosis not present

## 2022-10-23 NOTE — Progress Notes (Signed)
Carelink Summary Report / Loop Recorder 

## 2022-11-01 ENCOUNTER — Other Ambulatory Visit: Payer: Self-pay | Admitting: Cardiovascular Disease

## 2022-11-10 ENCOUNTER — Ambulatory Visit: Payer: Medicare Other

## 2022-11-10 DIAGNOSIS — R55 Syncope and collapse: Secondary | ICD-10-CM | POA: Diagnosis not present

## 2022-11-25 NOTE — Progress Notes (Signed)
Carelink Summary Report / Loop Recorder 

## 2022-12-03 ENCOUNTER — Ambulatory Visit: Payer: Medicare Other | Attending: Cardiovascular Disease | Admitting: Cardiovascular Disease

## 2022-12-03 ENCOUNTER — Encounter: Payer: Self-pay | Admitting: Cardiovascular Disease

## 2022-12-03 VITALS — BP 132/54 | HR 58 | Ht 60.0 in | Wt 152.0 lb

## 2022-12-03 DIAGNOSIS — Z87898 Personal history of other specified conditions: Secondary | ICD-10-CM | POA: Diagnosis not present

## 2022-12-03 DIAGNOSIS — I6523 Occlusion and stenosis of bilateral carotid arteries: Secondary | ICD-10-CM | POA: Insufficient documentation

## 2022-12-03 DIAGNOSIS — Z95818 Presence of other cardiac implants and grafts: Secondary | ICD-10-CM | POA: Insufficient documentation

## 2022-12-03 DIAGNOSIS — I10 Essential (primary) hypertension: Secondary | ICD-10-CM | POA: Diagnosis not present

## 2022-12-03 DIAGNOSIS — E7800 Pure hypercholesterolemia, unspecified: Secondary | ICD-10-CM | POA: Diagnosis not present

## 2022-12-03 DIAGNOSIS — I214 Non-ST elevation (NSTEMI) myocardial infarction: Secondary | ICD-10-CM

## 2022-12-03 DIAGNOSIS — I480 Paroxysmal atrial fibrillation: Secondary | ICD-10-CM | POA: Insufficient documentation

## 2022-12-03 DIAGNOSIS — Z8679 Personal history of other diseases of the circulatory system: Secondary | ICD-10-CM | POA: Insufficient documentation

## 2022-12-03 DIAGNOSIS — I350 Nonrheumatic aortic (valve) stenosis: Secondary | ICD-10-CM | POA: Diagnosis not present

## 2022-12-03 DIAGNOSIS — I2510 Atherosclerotic heart disease of native coronary artery without angina pectoris: Secondary | ICD-10-CM | POA: Insufficient documentation

## 2022-12-03 NOTE — Patient Instructions (Signed)
Medication Instructions:  No changes *If you need a refill on your cardiac medications before your next appointment, please call your pharmacy*  Testing/Procedures: Your physician has requested that you have an echocardiogram 10/2023- before yearly follow up. Echocardiography is a painless test that uses sound waves to create images of your heart. It provides your doctor with information about the size and shape of your heart and how well your heart's chambers and valves are working. This procedure takes approximately one hour. There are no restrictions for this procedure. Please do NOT wear cologne, perfume, aftershave, or lotions (deodorant is allowed). Please arrive 15 minutes prior to your appointment time.    Follow-Up: At [facility], you and your health needs are our priority.  As part of our continuing mission to provide you with exceptional heart care, we have created designated Provider Care Teams.  These Care Teams include your primary Cardiologist (physician) and Advanced Practice Providers (APPs -  Physician Assistants and Nurse Practitioners) who all work together to provide you with the care you need, when you need it.  We recommend signing up for the patient portal called "MyChart".  Sign up information is provided on this After Visit Summary.  MyChart is used to connect with patients for Virtual Visits (Telemedicine).  Patients are able to view lab/test results, encounter notes, upcoming appointments, etc.  Non-urgent messages can be sent to your provider as well.   To learn more about what you can do with MyChart, go to [email or web address].    Your next appointment:   1 year(s)  Provider:   Thurmon Fair, MD

## 2022-12-03 NOTE — Progress Notes (Signed)
Cardiology Office Note    Date:  12/07/2022   ID:  Eliane, Steenberg 1934-07-27, MRN [id number]  PCP:  Ollen Bowl, MD  Cardiologist:   Thurmon Fair, MD   Chief Complaint  Patient presents with   Atrial Fibrillation    History of Present Illness:  ELUTERIA BROCKSCHMIDT is a 88 y.o. female with history of recurrent syncope leading to implantable loop recorder, history of incidentally recorded paroxysmal atrial fibrillation, takotsubo cardiomyopathy with complete resolution, mild aortic valve stenosis, minor coronary artery disease with 75% stenosis of a first oblique marginal artery (asymptomatic), severe intraventricular hemorrhage after a fall in August 2023. (10-day hospital stay followed by a stint in skilled nursing.  Anticoagulation has been stopped since then).  She is doing well from a neurological point of view and has made a full recovery from her intracranial bleeding event a year ago.  She has not had any interval symptoms to suggest TIA or stroke.  On her loop recorder the burden of atrial fibrillation remains extremely low, well under 1%, although she did have a paroxysmal 6-hour event in January 2024 that was well rate controlled.  As before it was asymptomatic.  The patient specifically denies any chest pain at rest exertion, dyspnea at rest or with exertion, orthopnea, paroxysmal nocturnal dyspnea, syncope, palpitations, focal neurological deficits, intermittent claudication, lower extremity edema, unexplained weight gain, cough, hemoptysis or wheezing.  She has evidence of moderate multilevel degenerative disc disease and arthritis in her spine.  A previous CT angiogram has shown relatively mild intracranial arterial atherosclerotic stenoses.  Her daughter has been diagnosed with breast cancer and has a genetic abnormality (Lynch syndrome) that increases the risk of cancer in other family members, so everybody is being tested.  On top of this her son-in-law was  diagnosed with a bone marrow malignancy and is undergoing bone marrow transplant.  Her husband Nadine Counts is having serious back problems.  Westyn Windholz is also my patient.  Past Medical History:  Diagnosis Date   Allergy    Anemia 1984   before hysterectomy   Breast cancer (HCC)    bilateral   GERD (gastroesophageal reflux disease)    History of blood product transfusion 1984   Hyperlipidemia    Hypertension    Macular degeneration    right eye   Myocardial infarction (HCC)    Osteopenia    Personal history of radiation therapy    Takotsubo cardiomyopathy     Past Surgical History:  Procedure Laterality Date   ABDOMINAL HYSTERECTOMY  1984   APPENDECTOMY     BREAST BIOPSY Right 04/29/05   right    BREAST BIOPSY Left 2006   BREAST LUMPECTOMY Left 04/19/04   left with sentinel node   BREAST LUMPECTOMY Right 2007   CATARACT EXTRACTION     bilateral   EYE SURGERY     LEFT HEART CATHETERIZATION WITH CORONARY ANGIOGRAM N/A 07/26/2013   Procedure: LEFT HEART CATHETERIZATION WITH CORONARY ANGIOGRAM;  Surgeon: Corky Crafts, MD;  Location: [facility] CATH LAB;  Service: Cardiovascular;  Laterality: N/A;   RETINAL LASER PROCEDURE     right    Current Medications: Outpatient Medications Prior to Visit  Medication Sig Dispense Refill   acetaminophen (TYLENOL) 500 MG tablet Take 1,000 mg by mouth every 8 (eight) hours as needed for mild pain (for pain, headaches, or fever).     amLODipine (NORVASC) 5 MG tablet TAKE 1 TABLET BY MOUTH DAILY 90 tablet 3   atorvastatin (  LIPITOR) 20 MG tablet Take 20 mg by mouth at bedtime.     cholecalciferol 25 MCG (1000 UT) tablet Take 1,000 Units by mouth daily.     denosumab (PROLIA) 60 MG/ML SOSY injection Inject 60 mg into the skin every 6 (six) months.     fexofenadine (ALLEGRA) 180 MG tablet Take 180 mg by mouth daily.     latanoprost (XALATAN) 0.005 % ophthalmic solution Place 1 drop into both eyes at bedtime.      losartan (COZAAR) 50 MG tablet TAKE  TWO TABLETS BY MOUTH DAILY 180 tablet 3   metoprolol succinate (TOPROL-XL) 50 MG 24 hr tablet Take 50 mg by mouth daily.     Multiple Vitamins-Minerals (PRESERVISION AREDS 2+MULTI VIT) CAPS Take 1 tablet by mouth daily.     Nutritional Supplements (COLON FORMULA) 166.67-500 MG CAPS Take 1 capsule by mouth daily.     sertraline (ZOLOFT) 25 MG tablet Take 25 mg by mouth daily.     timolol (TIMOPTIC) 0.5 % ophthalmic solution Place 1 drop into the right eye every morning.      albuterol (VENTOLIN HFA) 108 (90 Base) MCG/ACT inhaler Inhale 1-2 puffs into the lungs every 6 (six) hours as needed for wheezing or shortness of breath. (Patient not taking: Reported on 12/03/2022)     nitroGLYCERIN (NITROSTAT) 0.4 MG SL tablet Place 1 tablet (0.4 mg total) under the tongue every 5 (five) minutes as needed for chest pain. (Patient not taking: Reported on 12/03/2022) 25 tablet 3   Facility-Administered Medications Prior to Visit  Medication Dose Route Frequency Provider Last Rate Last Admin   lidocaine-EPINEPHrine (XYLOCAINE W/EPI) 1 %-1:100000 (with pres) injection 10 mL  10 mL Infiltration Once Yarel Kilcrease, MD         Allergies:   Nsaids, Salicylates, Codeine, Etodolac, and Hydrochlorothiazide   Social History   Socioeconomic History   Marital status: Married    Spouse name: Molly Maduro   Number of children: 2   Years of education: college   Highest education level: Not on file  Occupational History   Not on file  Tobacco Use   Smoking status: Former    Current packs/day: 0.00    Types: Cigarettes    Quit date: 1990    Years since quitting: 34.7   Smokeless tobacco: Never  Vaping Use   Vaping status: Never Used  Substance and Sexual Activity   Alcohol use: Yes    Comment: 3 drinks per night, "too much"   Drug use: No   Sexual activity: Not on file  Other Topics Concern   Not on file  Social History Narrative   Lives with Spouse and dog   Drinks 3 cups of caffeine daily   Right handed              Social Determinants of Health   Financial Resource Strain: Not on file  Food Insecurity: Not on file  Transportation Needs: Not on file  Physical Activity: Not on file  Stress: Not on file  Social Connections: Not on file     Family History:  The patient's family history includes Breast cancer in her maternal aunt and maternal grandmother; Cancer in her father, maternal aunt, and maternal grandmother; Dementia in her mother; Heart attack in her maternal grandfather and paternal grandfather; Heart disease (age of onset: 78) in her father; Heart disease (age of onset: 69) in her mother; Stroke in her mother.   ROS:   Please see the history of present illness.  All other systems are reviewed and are negative.   PHYSICAL EXAM:   VS:  BP (!) 132/54 (BP Location: Left Arm, Patient Position: Sitting, Cuff Size: Normal)   Pulse (!) 58   Ht 5' (1.524 m)   Wt 152 lb (68.9 kg)   SpO2 96%   BMI 29.69 kg/m      General: Alert, oriented x3, no distress, healthy loop recorder site Head: no evidence of trauma, PERRL, EOMI, no exophtalmos or lid lag, no myxedema, no xanthelasma; normal ears, nose and oropharynx Neck: normal jugular venous pulsations and no hepatojugular reflux; brisk carotid pulses without delay and no carotid bruits Chest: clear to auscultation, no signs of consolidation by percussion or palpation, normal fremitus, symmetrical and full respiratory excursions Cardiovascular: normal position and quality of the apical impulse, regular rhythm, normal first and second heart sounds, early peaking 2/6 aortic ejection murmur, no diastolic murmurs, rubs or gallops Abdomen: no tenderness or distention, no masses by palpation, no abnormal pulsatility or arterial bruits, normal bowel sounds, no hepatosplenomegaly Extremities: no clubbing, cyanosis or edema; 2+ radial, ulnar and brachial pulses bilaterally; 2+ right femoral, posterior tibial and dorsalis pedis pulses; 2+ left  femoral, posterior tibial and dorsalis pedis pulses; no subclavian or femoral bruits Neurological: grossly nonfocal Psych: Normal mood and affect    Wt Readings from Last 3 Encounters:  12/03/22 152 lb (68.9 kg)  06/30/22 151 lb 3.2 oz (68.6 kg)  06/02/22 149 lb 6.4 oz (67.8 kg)      Studies/Labs Reviewed:   ECHO 11/12/2021:   1. Left ventricular ejection fraction, by estimation, is 55 to 60%. The  left ventricle has normal function. The left ventricle has no regional  wall motion abnormalities. There is mild concentric left ventricular  hypertrophy. Left ventricular diastolic  parameters are consistent with Grade I diastolic dysfunction (impaired  relaxation).   2. Right ventricular systolic function is normal. The right ventricular  size is normal. There is normal pulmonary artery systolic pressure. The  estimated right ventricular systolic pressure is 12.9 mmHg.   3. The mitral valve is grossly normal. Trivial mitral valve  regurgitation. No evidence of mitral stenosis.   4. The aortic valve is tricuspid. There is moderate calcification of the  aortic valve. There is moderate thickening of the aortic valve. Aortic  valve regurgitation is mild to moderate. Mild aortic valve stenosis.  Aortic valve area, by VTI measures 1.57  cm. Aortic valve mean gradient measures 12.0 mmHg. Aortic valve Vmax  measures 2.39 m/s.   5. The inferior vena cava is normal in size with greater than 50%  respiratory variability, suggesting right atrial pressure of 3 mmHg.   Comparison(s): No significant change from prior study.   EKG:  EKG is ordered today and shows mild sinus bradycardia, normal tracing.  QTc 424 ms.  Loop recorder downloads reviewed  Recent Labs: 06/13/2019 hemoglobin A1c 5.3%, creatinine 0.57, potassium 4.0, normal liver function tests 06/14/2020 creatinine 0.74, potassium 3.9, normal liver function tests, hemoglobin A1c 5.4%  06/24/2021 Hemoglobin A1c 5.5%, creatinine  0.71, potassium 4.0, hemoglobin 13.9, ALT 11  12/26/2021 Creatinine 0.55, potassium 3.2, ALT 14, hemoglobin 14.8  Lipid Panel    Component Value Date/Time   CHOL 159 07/26/2013 0718   TRIG 86 07/26/2013 0718   HDL 89 07/26/2013 0718   CHOLHDL 1.8 07/26/2013 0718   VLDL 17 07/26/2013 0718   LDLCALC 53 07/26/2013 0718  12/02/2018 Total cholesterol 176, HDL 80, LDL 81, triglycerides 79, hemoglobin A1c 5.4% 06/13/2019  Total cholesterol 181, HDL 76, LDL 81, triglycerides 80 06/14/2020 Cholesterol 187, HDL 77, LDL 89, triglycerides 117 16/12/9602 1265, HDL 65, LDL 81, triglycerides 111  ASSESSMENT:    1. Paroxysmal atrial fibrillation (HCC)   2. History of syncope   3. Coronary artery disease involving native coronary artery of native heart without angina pectoris   4. Aortic valve stenosis, nonrheumatic   5. Hypercholesterolemia   6. Essential hypertension   7. Status post placement of implantable loop recorder   8. Stenosis of intracranial portion of both internal carotid arteries   9. History of intracerebral hemorrhage without residual deficit       PLAN:  In order of problems listed above:  AFib: Very low prevalence of arrhythmia, a few hours a year.  The risk of bleeding complications outweighs the risk of embolic events.  More commonly she has episodes of sustained atrial tachycardia, usually brief.  CHADSVasc 5 (age 88, gender, HTN, CAD), but the risk of bleeding including intracerebral hemorrhage after falls has clearly outweighed the risk of ischemic stroke.  Not on anticoagulation. Syncope: She has not had falls or syncope since the events of August 2023.  Since loop recorder implantation, although she has had several falls, we have never identified a rhythm that could cause syncope.  Most likely related to hypotension. CAD: Asymptomatic. Moderate CAD was discovered incidentally when she had coronary angiography for takotsubo syndrome.  Mild AS: Repeat echocardiogram.   Asymptomatic. HLP: Excellent HDL, acceptable LDL (would prefer less than 70) HTN: In desirable range.  Will tolerate systolic blood pressure 140-150s since she has low diastolic blood pressure, history of falls related to orthostatic hypotension.   ILR: Continue to monitor for atrial arrhythmia. Intracranial atherosclerosis: No severe stenoses, mild bilateral intracranial stenoses. History of intraventricular cerebral hemorrhage August 2023.   Medication Adjustments/Labs and Tests Ordered: Current medicines are reviewed at length with the patient today.  Concerns regarding medicines are outlined above.  Medication changes, Labs and Tests ordered today are listed in the Patient Instructions below. Patient Instructions  Medication Instructions:  No changes *If you need a refill on your cardiac medications before your next appointment, please call your pharmacy*  Testing/Procedures: Your physician has requested that you have an echocardiogram 10/2023- before yearly follow up. Echocardiography is a painless test that uses sound waves to create images of your heart. It provides your doctor with information about the size and shape of your heart and how well your heart's chambers and valves are working. This procedure takes approximately one hour. There are no restrictions for this procedure. Please do NOT wear cologne, perfume, aftershave, or lotions (deodorant is allowed). Please arrive 15 minutes prior to your appointment time.    Follow-Up: At [facility], you and your health needs are our priority.  As part of our continuing mission to provide you with exceptional heart care, we have created designated Provider Care Teams.  These Care Teams include your primary Cardiologist (physician) and Advanced Practice Providers (APPs -  Physician Assistants and Nurse Practitioners) who all work together to provide you with the care you need, when you need it.  We recommend signing up for the  patient portal called "MyChart".  Sign up information is provided on this After Visit Summary.  MyChart is used to connect with patients for Virtual Visits (Telemedicine).  Patients are able to view lab/test results, encounter notes, upcoming appointments, etc.  Non-urgent messages can be sent to your provider as well.   To learn  more about what you can do with MyChart, go to [email or web address].    Your next appointment:   1 year(s)  Provider:   Thurmon Fair, MD        Signed, Thurmon Fair, MD  12/07/2022 12:09 PM    [facility] [address] [place], [place], [place]  [address] Phone: [phone]; Fax: [phone]

## 2022-12-09 DIAGNOSIS — H353114 Nonexudative age-related macular degeneration, right eye, advanced atrophic with subfoveal involvement: Secondary | ICD-10-CM | POA: Diagnosis not present

## 2022-12-09 DIAGNOSIS — H43812 Vitreous degeneration, left eye: Secondary | ICD-10-CM | POA: Diagnosis not present

## 2022-12-09 DIAGNOSIS — H353212 Exudative age-related macular degeneration, right eye, with inactive choroidal neovascularization: Secondary | ICD-10-CM | POA: Diagnosis not present

## 2022-12-09 DIAGNOSIS — H353123 Nonexudative age-related macular degeneration, left eye, advanced atrophic without subfoveal involvement: Secondary | ICD-10-CM | POA: Diagnosis not present

## 2022-12-09 DIAGNOSIS — H401131 Primary open-angle glaucoma, bilateral, mild stage: Secondary | ICD-10-CM | POA: Diagnosis not present

## 2022-12-09 DIAGNOSIS — Z9889 Other specified postprocedural states: Secondary | ICD-10-CM | POA: Diagnosis not present

## 2022-12-15 ENCOUNTER — Ambulatory Visit (INDEPENDENT_AMBULATORY_CARE_PROVIDER_SITE_OTHER): Payer: Medicare Other

## 2022-12-15 DIAGNOSIS — R55 Syncope and collapse: Secondary | ICD-10-CM | POA: Diagnosis not present

## 2022-12-16 LAB — CUP PACEART REMOTE DEVICE CHECK
Date Time Interrogation Session: 20240916015843
Implantable Pulse Generator Implant Date: 20210111
Zone Setting Status: 755011
Zone Setting Status: 755011
Zone Setting Status: 755011
Zone Setting Status: 755011

## 2022-12-23 DIAGNOSIS — Z23 Encounter for immunization: Secondary | ICD-10-CM | POA: Diagnosis not present

## 2022-12-31 NOTE — Progress Notes (Signed)
Carelink Summary Report / Loop Recorder 

## 2023-01-01 DIAGNOSIS — I4891 Unspecified atrial fibrillation: Secondary | ICD-10-CM | POA: Diagnosis not present

## 2023-01-01 DIAGNOSIS — E7800 Pure hypercholesterolemia, unspecified: Secondary | ICD-10-CM | POA: Diagnosis not present

## 2023-01-01 DIAGNOSIS — F39 Unspecified mood [affective] disorder: Secondary | ICD-10-CM | POA: Diagnosis not present

## 2023-01-01 DIAGNOSIS — D6869 Other thrombophilia: Secondary | ICD-10-CM | POA: Diagnosis not present

## 2023-01-01 DIAGNOSIS — Z0000 Encounter for general adult medical examination without abnormal findings: Secondary | ICD-10-CM | POA: Diagnosis not present

## 2023-01-01 DIAGNOSIS — I350 Nonrheumatic aortic (valve) stenosis: Secondary | ICD-10-CM | POA: Diagnosis not present

## 2023-01-01 DIAGNOSIS — E559 Vitamin D deficiency, unspecified: Secondary | ICD-10-CM | POA: Diagnosis not present

## 2023-01-01 DIAGNOSIS — I700 Atherosclerosis of aorta: Secondary | ICD-10-CM | POA: Diagnosis not present

## 2023-01-01 DIAGNOSIS — Z23 Encounter for immunization: Secondary | ICD-10-CM | POA: Diagnosis not present

## 2023-01-01 DIAGNOSIS — I119 Hypertensive heart disease without heart failure: Secondary | ICD-10-CM | POA: Diagnosis not present

## 2023-01-01 DIAGNOSIS — I2510 Atherosclerotic heart disease of native coronary artery without angina pectoris: Secondary | ICD-10-CM | POA: Diagnosis not present

## 2023-01-01 DIAGNOSIS — M8588 Other specified disorders of bone density and structure, other site: Secondary | ICD-10-CM | POA: Diagnosis not present

## 2023-01-07 DIAGNOSIS — H401131 Primary open-angle glaucoma, bilateral, mild stage: Secondary | ICD-10-CM | POA: Diagnosis not present

## 2023-01-07 DIAGNOSIS — H353123 Nonexudative age-related macular degeneration, left eye, advanced atrophic without subfoveal involvement: Secondary | ICD-10-CM | POA: Diagnosis not present

## 2023-01-07 DIAGNOSIS — H353212 Exudative age-related macular degeneration, right eye, with inactive choroidal neovascularization: Secondary | ICD-10-CM | POA: Diagnosis not present

## 2023-01-19 ENCOUNTER — Ambulatory Visit: Payer: Medicare Other

## 2023-01-19 DIAGNOSIS — R55 Syncope and collapse: Secondary | ICD-10-CM | POA: Diagnosis not present

## 2023-01-20 LAB — CUP PACEART REMOTE DEVICE CHECK
Date Time Interrogation Session: 20241020230524
Implantable Pulse Generator Implant Date: 20210111

## 2023-02-05 NOTE — Progress Notes (Signed)
Carelink Summary Report / Loop Recorder 

## 2023-02-07 DIAGNOSIS — Z23 Encounter for immunization: Secondary | ICD-10-CM | POA: Diagnosis not present

## 2023-02-09 DIAGNOSIS — H401131 Primary open-angle glaucoma, bilateral, mild stage: Secondary | ICD-10-CM | POA: Diagnosis not present

## 2023-02-09 DIAGNOSIS — H353212 Exudative age-related macular degeneration, right eye, with inactive choroidal neovascularization: Secondary | ICD-10-CM | POA: Diagnosis not present

## 2023-02-09 DIAGNOSIS — H353123 Nonexudative age-related macular degeneration, left eye, advanced atrophic without subfoveal involvement: Secondary | ICD-10-CM | POA: Diagnosis not present

## 2023-02-18 DIAGNOSIS — H353212 Exudative age-related macular degeneration, right eye, with inactive choroidal neovascularization: Secondary | ICD-10-CM | POA: Diagnosis not present

## 2023-02-18 DIAGNOSIS — Z9889 Other specified postprocedural states: Secondary | ICD-10-CM | POA: Diagnosis not present

## 2023-02-18 DIAGNOSIS — H353123 Nonexudative age-related macular degeneration, left eye, advanced atrophic without subfoveal involvement: Secondary | ICD-10-CM | POA: Diagnosis not present

## 2023-02-18 DIAGNOSIS — H43812 Vitreous degeneration, left eye: Secondary | ICD-10-CM | POA: Diagnosis not present

## 2023-02-18 DIAGNOSIS — H353114 Nonexudative age-related macular degeneration, right eye, advanced atrophic with subfoveal involvement: Secondary | ICD-10-CM | POA: Diagnosis not present

## 2023-02-18 DIAGNOSIS — H401131 Primary open-angle glaucoma, bilateral, mild stage: Secondary | ICD-10-CM | POA: Diagnosis not present

## 2023-02-23 ENCOUNTER — Ambulatory Visit (INDEPENDENT_AMBULATORY_CARE_PROVIDER_SITE_OTHER): Payer: Medicare Other

## 2023-02-23 DIAGNOSIS — R55 Syncope and collapse: Secondary | ICD-10-CM | POA: Diagnosis not present

## 2023-02-23 LAB — CUP PACEART REMOTE DEVICE CHECK
Date Time Interrogation Session: 20241122230324
Implantable Pulse Generator Implant Date: 20210111

## 2023-03-11 DIAGNOSIS — H353212 Exudative age-related macular degeneration, right eye, with inactive choroidal neovascularization: Secondary | ICD-10-CM | POA: Diagnosis not present

## 2023-03-11 DIAGNOSIS — H353123 Nonexudative age-related macular degeneration, left eye, advanced atrophic without subfoveal involvement: Secondary | ICD-10-CM | POA: Diagnosis not present

## 2023-03-11 DIAGNOSIS — H401131 Primary open-angle glaucoma, bilateral, mild stage: Secondary | ICD-10-CM | POA: Diagnosis not present

## 2023-03-23 NOTE — Progress Notes (Signed)
Carelink Summary Report / Loop Recorder 

## 2023-03-30 ENCOUNTER — Ambulatory Visit (INDEPENDENT_AMBULATORY_CARE_PROVIDER_SITE_OTHER): Payer: Medicare Other

## 2023-03-30 DIAGNOSIS — R55 Syncope and collapse: Secondary | ICD-10-CM

## 2023-03-30 LAB — CUP PACEART REMOTE DEVICE CHECK
Date Time Interrogation Session: 20241229230249
Implantable Pulse Generator Implant Date: 20210111

## 2023-05-04 ENCOUNTER — Ambulatory Visit (INDEPENDENT_AMBULATORY_CARE_PROVIDER_SITE_OTHER): Payer: Medicare Other

## 2023-05-04 DIAGNOSIS — R55 Syncope and collapse: Secondary | ICD-10-CM | POA: Diagnosis not present

## 2023-05-05 LAB — CUP PACEART REMOTE DEVICE CHECK
Date Time Interrogation Session: 20250202230152
Implantable Pulse Generator Implant Date: 20210111

## 2023-06-08 ENCOUNTER — Ambulatory Visit (INDEPENDENT_AMBULATORY_CARE_PROVIDER_SITE_OTHER): Payer: Medicare Other

## 2023-06-08 DIAGNOSIS — R55 Syncope and collapse: Secondary | ICD-10-CM

## 2023-06-08 LAB — CUP PACEART REMOTE DEVICE CHECK
Date Time Interrogation Session: 20250309230157
Implantable Pulse Generator Implant Date: 20210111

## 2023-06-09 ENCOUNTER — Encounter: Payer: Self-pay | Admitting: Cardiovascular Disease

## 2023-06-10 NOTE — Progress Notes (Signed)
 Carelink Summary Report / Loop Recorder

## 2023-06-10 NOTE — Addendum Note (Signed)
 Addended by: Geralyn Flash D on: 06/10/2023 03:12 PM   Modules accepted: Orders

## 2023-07-13 ENCOUNTER — Ambulatory Visit (INDEPENDENT_AMBULATORY_CARE_PROVIDER_SITE_OTHER): Payer: Medicare Other

## 2023-07-13 DIAGNOSIS — R55 Syncope and collapse: Secondary | ICD-10-CM | POA: Diagnosis not present

## 2023-07-13 LAB — CUP PACEART REMOTE DEVICE CHECK
Date Time Interrogation Session: 20250413230438
Implantable Pulse Generator Implant Date: 20210111

## 2023-07-18 ENCOUNTER — Encounter: Payer: Self-pay | Admitting: Cardiovascular Disease

## 2023-07-20 NOTE — Progress Notes (Signed)
 Carelink Summary Report / Loop Recorder

## 2023-08-17 ENCOUNTER — Ambulatory Visit (INDEPENDENT_AMBULATORY_CARE_PROVIDER_SITE_OTHER): Payer: Medicare Other

## 2023-08-17 DIAGNOSIS — R55 Syncope and collapse: Secondary | ICD-10-CM

## 2023-08-17 LAB — CUP PACEART REMOTE DEVICE CHECK
Date Time Interrogation Session: 20250518230637
Implantable Pulse Generator Implant Date: 20210111

## 2023-08-21 ENCOUNTER — Ambulatory Visit: Payer: Self-pay | Admitting: Cardiovascular Disease

## 2023-09-02 NOTE — Progress Notes (Signed)
 Carelink Summary Report / Loop Recorder

## 2023-09-08 ENCOUNTER — Other Ambulatory Visit: Payer: Self-pay | Admitting: Cardiovascular Disease

## 2023-09-17 ENCOUNTER — Ambulatory Visit (INDEPENDENT_AMBULATORY_CARE_PROVIDER_SITE_OTHER): Payer: Self-pay

## 2023-09-17 DIAGNOSIS — R55 Syncope and collapse: Secondary | ICD-10-CM

## 2023-09-17 LAB — CUP PACEART REMOTE DEVICE CHECK
Date Time Interrogation Session: 20250618230720
Implantable Pulse Generator Implant Date: 20210111

## 2023-09-23 ENCOUNTER — Ambulatory Visit: Payer: Self-pay | Admitting: Cardiovascular Disease

## 2023-10-07 NOTE — Progress Notes (Signed)
 Carelink Summary Report / Loop Recorder

## 2023-10-19 ENCOUNTER — Ambulatory Visit (INDEPENDENT_AMBULATORY_CARE_PROVIDER_SITE_OTHER): Payer: Self-pay

## 2023-10-19 DIAGNOSIS — R55 Syncope and collapse: Secondary | ICD-10-CM

## 2023-10-20 ENCOUNTER — Ambulatory Visit: Payer: Self-pay | Admitting: Cardiovascular Disease

## 2023-10-20 LAB — CUP PACEART REMOTE DEVICE CHECK
Date Time Interrogation Session: 20250720230524
Implantable Pulse Generator Implant Date: 20210111

## 2023-10-29 ENCOUNTER — Ambulatory Visit (HOSPITAL_COMMUNITY)
Admission: RE | Admit: 2023-10-29 | Discharge: 2023-10-29 | Disposition: A | Discharge status: Home / Self Care | Source: Ambulatory Visit | Attending: Cardiology | Admitting: Cardiology

## 2023-10-29 DIAGNOSIS — I350 Nonrheumatic aortic (valve) stenosis: Secondary | ICD-10-CM | POA: Diagnosis present

## 2023-10-29 LAB — ECHOCARDIOGRAM COMPLETE
AR max vel: 0.79 cm2
AV Area VTI: 0.99 cm2
AV Area mean vel: 0.84 cm2
AV Mean grad: 18 mmHg
AV Peak grad: 36 mmHg
Ao pk vel: 3 m/s
Area-P 1/2: 3.91 cm2
P 1/2 time: 390 ms
S' Lateral: 3.1 cm

## 2023-10-30 ENCOUNTER — Ambulatory Visit: Payer: Self-pay | Admitting: Cardiovascular Disease

## 2023-11-03 ENCOUNTER — Telehealth: Payer: Self-pay | Admitting: Cardiovascular Disease

## 2023-11-03 NOTE — Telephone Encounter (Signed)
 Patient's spouse is calling to discuss her echo results.

## 2023-11-03 NOTE — Telephone Encounter (Signed)
 Per Dr. Francyne, Aortic stenosis has worsened, but is not yet severe. Normal heart pumping strength. Due for yearly appointment - can we schedule please? Not urgent.  Called patient's husband (DPR) back about message and Dr. Tyrone results from echo. Patient's husband verbalized understanding and will keep appointment in November.

## 2023-11-03 NOTE — Telephone Encounter (Signed)
Mailed results to patient

## 2023-11-16 NOTE — Progress Notes (Signed)
 Carelink Summary Report / Loop Recorder

## 2023-11-19 ENCOUNTER — Ambulatory Visit: Payer: Self-pay | Admitting: Cardiovascular Disease

## 2023-11-19 ENCOUNTER — Ambulatory Visit (INDEPENDENT_AMBULATORY_CARE_PROVIDER_SITE_OTHER): Payer: Self-pay

## 2023-11-19 DIAGNOSIS — R55 Syncope and collapse: Secondary | ICD-10-CM | POA: Diagnosis not present

## 2023-11-19 LAB — CUP PACEART REMOTE DEVICE CHECK
Date Time Interrogation Session: 20250820230229
Implantable Pulse Generator Implant Date: 20210111

## 2023-12-21 ENCOUNTER — Ambulatory Visit: Payer: Self-pay | Admitting: Cardiovascular Disease

## 2023-12-21 ENCOUNTER — Ambulatory Visit (INDEPENDENT_AMBULATORY_CARE_PROVIDER_SITE_OTHER): Payer: Self-pay

## 2023-12-21 DIAGNOSIS — I480 Paroxysmal atrial fibrillation: Secondary | ICD-10-CM

## 2023-12-21 LAB — CUP PACEART REMOTE DEVICE CHECK
Date Time Interrogation Session: 20250921230112
Implantable Pulse Generator Implant Date: 20210111

## 2023-12-22 NOTE — Progress Notes (Signed)
 Remote Loop Recorder Transmission

## 2023-12-23 NOTE — Progress Notes (Signed)
 Remote Loop Recorder Transmission

## 2024-01-04 NOTE — Progress Notes (Signed)
 Remote Loop Recorder Transmission

## 2024-01-21 ENCOUNTER — Ambulatory Visit

## 2024-01-21 DIAGNOSIS — I480 Paroxysmal atrial fibrillation: Secondary | ICD-10-CM

## 2024-01-21 LAB — CUP PACEART REMOTE DEVICE CHECK
Date Time Interrogation Session: 20251022230402
Implantable Pulse Generator Implant Date: 20210111

## 2024-01-22 NOTE — Progress Notes (Signed)
 Remote Loop Recorder Transmission

## 2024-01-25 ENCOUNTER — Ambulatory Visit: Payer: Self-pay | Admitting: Cardiovascular Disease

## 2024-02-01 ENCOUNTER — Encounter: Payer: Self-pay | Admitting: Cardiovascular Disease

## 2024-02-01 ENCOUNTER — Ambulatory Visit: Attending: Cardiovascular Disease | Admitting: Cardiovascular Disease

## 2024-02-01 VITALS — BP 149/50 | HR 62 | Ht 61.0 in | Wt 156.2 lb

## 2024-02-01 DIAGNOSIS — I480 Paroxysmal atrial fibrillation: Secondary | ICD-10-CM | POA: Diagnosis not present

## 2024-02-01 DIAGNOSIS — I2510 Atherosclerotic heart disease of native coronary artery without angina pectoris: Secondary | ICD-10-CM

## 2024-02-01 DIAGNOSIS — R55 Syncope and collapse: Secondary | ICD-10-CM

## 2024-02-01 DIAGNOSIS — I350 Nonrheumatic aortic (valve) stenosis: Secondary | ICD-10-CM | POA: Diagnosis not present

## 2024-02-01 DIAGNOSIS — E7800 Pure hypercholesterolemia, unspecified: Secondary | ICD-10-CM

## 2024-02-01 DIAGNOSIS — I10 Essential (primary) hypertension: Secondary | ICD-10-CM

## 2024-02-01 NOTE — Progress Notes (Unsigned)
 Cardiology Office Note    Date:  02/04/2024   ID:  Catalaya, Garr 1934/11/16, MRN [id number]  PCP:  Vernon Velna SAUNDERS, MD  Cardiologist:   Jerel Balding, MD   No chief complaint on file.   History of Present Illness:  Melinda [name] is a 89 y.o. female with history of recurrent syncope leading to implantable loop recorder, history of incidentally recorded paroxysmal atrial fibrillation, takotsubo cardiomyopathy with complete resolution, mild aortic valve stenosis, minor coronary artery disease with 75% stenosis of a first oblique marginal artery (asymptomatic), severe intraventricular hemorrhage after a fall in August 2023. (10-day hospital stay followed by a stint in skilled nursing.  Anticoagulation has been stopped since then).  She has done reasonably well since her last appointment.  She has not had any falls or any bleeding problems.  Her biggest issue is intractable recurrent diarrhea.  She is taking colestipol and is seeing gastroenterology.  She really does not have any sequelae from her intracranial bleeding event in 2023.  Her loop recorder still shows very rare episodes of atrial fibrillation, well under 1% prevalence and consistently asymptomatic.  She has not had any atrial fibrillation detected since June 2025 when she had an 8.5-hour episode, in turn the first recurrence in almost a year.  The patient specifically denies any chest pain at rest or with exertion, dyspnea at rest or with exertion, orthopnea, paroxysmal nocturnal dyspnea, syncope, palpitations, focal neurological deficits, intermittent claudication, lower extremity edema, unexplained weight gain, cough, hemoptysis or wheezing.  Her echocardiogram in July of this year showed normal left ventricular systolic function and worsening degenerative aortic stenosis, now in moderate range (peak velocity 3 m/s, mean gradient 19 mmHg, dimensionless valve index 0.27, calculated aortic valve area 1 cm).  The same  echo also showed grade 2 diastolic dysfunction, but the E/e' ratio was borderline (lateral 9, medial 15) and the RV systolic pressure was also borderline at 36 mmHg  She has evidence of moderate multilevel degenerative disc disease and arthritis in her spine.  A previous CT angiogram has shown relatively mild intracranial arterial atherosclerotic stenoses.  Her daughter has been diagnosed with breast cancer and has a genetic abnormality (Lynch syndrome) that increases the risk of cancer in other family members, so everybody is being tested.  On top of this her son-in-law was diagnosed with a bone marrow malignancy and is undergoing bone marrow transplant.  Her husband Octaviano is having serious back problems.  Her husband Mashal Slavick is also my patient.  Past Medical History:  Diagnosis Date   Allergy    Anemia 1984   before hysterectomy   Breast cancer (HCC)    bilateral   GERD (gastroesophageal reflux disease)    History of blood product transfusion 1984   Hyperlipidemia    Hypertension    Macular degeneration    right eye   Myocardial infarction (HCC)    Osteopenia    Personal history of radiation therapy    Takotsubo cardiomyopathy     Past Surgical History:  Procedure Laterality Date   ABDOMINAL HYSTERECTOMY  1984   APPENDECTOMY     BREAST BIOPSY Right 04/29/05   right    BREAST BIOPSY Left 2006   BREAST LUMPECTOMY Left 04/19/04   left with sentinel node   BREAST LUMPECTOMY Right 2007   CATARACT EXTRACTION     bilateral   EYE SURGERY     LEFT HEART CATHETERIZATION WITH CORONARY ANGIOGRAM N/A 07/26/2013   Procedure: LEFT HEART  CATHETERIZATION WITH CORONARY ANGIOGRAM;  Surgeon: Candyce GORMAN Reek, MD;  Location: [facility] CATH LAB;  Service: Cardiovascular;  Laterality: N/A;   RETINAL LASER PROCEDURE     right    Current Medications: Outpatient Medications Prior to Visit  Medication Sig Dispense Refill   acetaminophen  (TYLENOL ) 500 MG tablet Take 1,000 mg by mouth every 8  (eight) hours as needed for mild pain (for pain, headaches, or fever).     amLODipine  (NORVASC ) 5 MG tablet TAKE 1 TABLET BY MOUTH DAILY 90 tablet 3   atorvastatin  (LIPITOR) 20 MG tablet Take 20 mg by mouth at bedtime.     cholecalciferol 25 MCG (1000 UT) tablet Take 1,000 Units by mouth daily.     colestipol (COLESTID) 1 g tablet Take 2 g by mouth daily.     denosumab  (PROLIA ) 60 MG/ML SOSY injection Inject 60 mg into the skin every 6 (six) months.     fexofenadine (ALLEGRA) 180 MG tablet Take 180 mg by mouth daily.     latanoprost  (XALATAN ) 0.005 % ophthalmic solution Place 1 drop into both eyes at bedtime.      losartan  (COZAAR ) 50 MG tablet TAKE 2 TABLETS BY MOUTH DAILY 180 tablet 3   metoprolol  succinate (TOPROL -XL) 50 MG 24 hr tablet Take 50 mg by mouth daily.     Multiple Vitamins-Minerals (PRESERVISION AREDS 2+MULTI VIT) CAPS Take 1 tablet by mouth daily.     nitroGLYCERIN  (NITROSTAT ) 0.4 MG SL tablet Place 1 tablet (0.4 mg total) under the tongue every 5 (five) minutes as needed for chest pain. 25 tablet 3   Nutritional Supplements (COLON FORMULA) 166.67-500 MG CAPS Take 1 capsule by mouth daily.     potassium chloride  (MICRO-K ) 10 MEQ CR capsule Take 20 mEq by mouth daily.     RHOPRESSA 0.02 % SOLN Place 1 drop into the right eye daily at 6 (six) AM.     sertraline  (ZOLOFT ) 25 MG tablet Take 25 mg by mouth daily.     timolol  (BETIMOL ) 0.5 % ophthalmic solution Place 1 drop into both eyes daily.     timolol  (TIMOPTIC ) 0.5 % ophthalmic solution Place 1 drop into the right eye every morning.      albuterol  (VENTOLIN  HFA) 108 (90 Base) MCG/ACT inhaler Inhale 1-2 puffs into the lungs every 6 (six) hours as needed for wheezing or shortness of breath. (Patient not taking: Reported on 02/01/2024)     Facility-Administered Medications Prior to Visit  Medication Dose Route Frequency Provider Last Rate Last Admin   lidocaine -EPINEPHrine  (XYLOCAINE  W/EPI) 1 %-1:100000 (with pres) injection 10 mL   10 mL Infiltration Once Jesse Hirst, MD         Allergies:   Nsaids, Salicylates, Codeine, Etodolac, and Hydrochlorothiazide      Family History:  The patient's family history includes Breast cancer in her maternal aunt and maternal grandmother; Cancer in her father, maternal aunt, and maternal grandmother; Dementia in her mother; Heart attack in her maternal grandfather and paternal grandfather; Heart disease (age of onset: 84) in her father; Heart disease (age of onset: 2) in her mother; Stroke in her mother.   ROS:   Please see the history of present illness.    All other systems are reviewed and are negative.   PHYSICAL EXAM:   VS:  BP (!) 149/50   Pulse 62   Ht 5' 1 (1.549 m)   Wt 156 lb 3.2 oz (70.9 kg)   SpO2 96%   BMI 29.51 kg/m  General: Alert, oriented x3, no distress, overweight Head: no evidence of trauma, PERRL, EOMI, no exophtalmos or lid lag, no myxedema, no xanthelasma; normal ears, nose and oropharynx Neck: normal jugular venous pulsations and no hepatojugular reflux; brisk carotid pulses without delay and no carotid bruits Chest: clear to auscultation, no signs of consolidation by percussion or palpation, normal fremitus, symmetrical and full respiratory excursions Cardiovascular: normal position and quality of the apical impulse, regular rhythm, normal first and distinct second heart sounds, 2/6 early peaking aortic ejection murmur no diastolic murmurs, rubs or gallops Abdomen: no tenderness or distention, no masses by palpation, no abnormal pulsatility or arterial bruits, normal bowel sounds, no hepatosplenomegaly Extremities: no clubbing, cyanosis or edema; 2+ radial, ulnar and brachial pulses bilaterally; 2+ right femoral, posterior tibial and dorsalis pedis pulses; 2+ left femoral, posterior tibial and dorsalis pedis pulses; no subclavian or femoral bruits Neurological: grossly nonfocal Psych: Normal mood and affect Appears well.  Healthy loop recorder  site.    Wt Readings from Last 3 Encounters:  02/01/24 156 lb 3.2 oz (70.9 kg)  12/03/22 152 lb (68.9 kg)  06/30/22 151 lb 3.2 oz (68.6 kg)      Studies/Labs Reviewed:   ECHO 10/29/2023:   1. Left ventricular ejection fraction, by estimation, is 55 to 60%. The  left ventricle has normal function. The left ventricle has no regional  wall motion abnormalities. Left ventricular diastolic parameters are  consistent with Grade II diastolic  dysfunction (pseudonormalization).   2. Right ventricular systolic function is normal. The right ventricular  size is normal. There is normal pulmonary artery systolic pressure.   3. Left atrial size was mildly dilated.   4. The mitral valve is degenerative. Mild mitral valve regurgitation. No  evidence of mitral stenosis.   5. The tricuspid valve is degenerative. Tricuspid valve regurgitation is  moderate.   6. Native aortic valve, trileaflet, moderate leaflet thickness, moderate  to severe calcification, reduced leaflet excursion, moderate aortic  regurgitation, moderate to severe aortic stenosis (peak velocity 3 m/s,  mean gradient 18 mmHg, dimensional index   0.27, aortic valve area per VTI 0.99 cm, SVi 45).   7. The inferior vena cava is normal in size with greater than 50%  respiratory variability, suggesting right atrial pressure of 3 mmHg.   Comparison(s): A prior study was performed on 11/12/2021. Mild to moderate  aortic stenosis is now moderate to severe, Grade I diastolic dysfunction  is now Grade II.   EKG:    EKG Interpretation Date/Time:  Monday February 01 2024 16:38:22 EST Ventricular Rate:  62 PR Interval:  180 QRS Duration:  100 QT Interval:  440 QTC Calculation: 446 R Axis:   -18  Text Interpretation: Normal sinus rhythm Minimal voltage criteria for LVH, may be normal variant ( [employer] product ) When compared with ECG of 03-Dec-2022 13:17, No significant change was found Confirmed by Evaleen Sant [phone]) on  02/01/2024 4:51:00 PM         Loop recorder downloads reviewed  Recent Labs: 06/13/2019 hemoglobin A1c 5.3%, creatinine 0.57, potassium 4.0, normal liver function tests 06/14/2020 creatinine 0.74, potassium 3.9, normal liver function tests, hemoglobin A1c 5.4%  06/24/2021 Hemoglobin A1c 5.5%, creatinine 0.71, potassium 4.0, hemoglobin 13.9, ALT 11  12/26/2021 Creatinine 0.55, potassium 3.2, ALT 14, hemoglobin 14.8  Lipid Panel    Component Value Date/Time   CHOL 159 07/26/2013 0718   TRIG 86 07/26/2013 0718   HDL 89 07/26/2013 0718   CHOLHDL 1.8 07/26/2013 9281  VLDL 17 07/26/2013 0718   LDLCALC 53 07/26/2013 0718   01/11/2024 Cholesterol 196, HDL 97, LDL 74, triglycerides 95 Hemoglobin A1c 5.5%  ASSESSMENT:    1. Paroxysmal atrial fibrillation (HCC)   2. Syncope, unspecified syncope type   3. Aortic valve stenosis, nonrheumatic   4. Coronary artery disease involving native coronary artery of native heart without angina pectoris   5. Hypercholesterolemia   6. Essential hypertension       PLAN:  In order of problems listed above:  AFib: Extremely low prevalence, just a few hours every year.  The risk of bleeding complications outweighs the risk of embolic events.   CHADSVasc 5 (age 47, gender, HTN, CAD), but the risk of bleeding including intracerebral hemorrhage after falls has clearly outweighed the risk of ischemic stroke.  Not on anticoagulation.  Continue to monitor via loop recorder. Syncope: She has not had falls or syncope since the events of August 2023.  Since the implantation of the loop recorder she has had several falls but we have never detected a rhythm that could cause syncope.  Her falls are most likely related to hypotension. CAD: She does not have angina pectoris. Moderate CAD was discovered incidentally when she had coronary angiography for takotsubo syndrome.  AS: Has progressed to moderate range.  Recheck echo yearly, sooner if she develops  exertional angina, exertional dyspnea or exertional syncope.  Even though she may not be a candidate for surgical aortic valve replacement, we briefly discussed option for TAVR should this become necessary. HLP: HDL is excellent and LDL is close to target range. HTN: Although her systolic blood pressure is high, it is in the desirable range.  She has orthostatic hypotension and tends to run a very low diastolic blood pressure.  Should not attempt to achieve perfect blood pressure control. ILR: Continue to monitor for atrial arrhythmia. Intracranial atherosclerosis: No severe stenoses, mild bilateral intracranial stenoses. History of intraventricular cerebral hemorrhage August 2023.   Medication Adjustments/Labs and Tests Ordered: Current medicines are reviewed at length with the patient today.  Concerns regarding medicines are outlined above.  Medication changes, Labs and Tests ordered today are listed in the Patient Instructions below. Patient Instructions  Medication Instructions:  No changes *If you need a refill on your cardiac medications before your next appointment, please call your pharmacy*  Lab Work: None ordered If you have labs (blood work) drawn today and your tests are completely normal, you will receive your results only by: MyChart Message (if you have MyChart) OR A paper copy in the mail If you have any lab test that is abnormal or we need to change your treatment, we will call you to review the results.  Testing/Procedures: Your physician has requested that you have an echocardiogram in July 2026. Echocardiography is a painless test that uses sound waves to create images of your heart. It provides your doctor with information about the size and shape of your heart and how well your heart's chambers and valves are working. This procedure takes approximately one hour. There are no restrictions for this procedure. Please do NOT wear cologne, perfume, aftershave, or lotions  (deodorant is allowed). Please arrive 15 minutes prior to your appointment time.  Please note: We ask at that you not bring children with you during ultrasound (echo/ vascular) testing. Due to room size and safety concerns, children are not allowed in the ultrasound rooms during exams. Our front office staff cannot provide observation of children in our lobby area while testing  is being conducted. An adult accompanying a patient to their appointment will only be allowed in the ultrasound room at the discretion of the ultrasound technician under special circumstances. We apologize for any inconvenience.   Follow-Up: At [facility], you and your health needs are our priority.  As part of our continuing mission to provide you with exceptional heart care, our providers are all part of one team.  This team includes your primary Cardiologist (physician) and Advanced Practice Providers or APPs (Physician Assistants and Nurse Practitioners) who all work together to provide you with the care you need, when you need it.  Your next appointment:   1 year(s)  Provider:   Jerel Balding, MD    We recommend signing up for the patient portal called MyChart.  Sign up information is provided on this After Visit Summary.  MyChart is used to connect with patients for Virtual Visits (Telemedicine).  Patients are able to view lab/test results, encounter notes, upcoming appointments, etc.  Non-urgent messages can be sent to your provider as well.   To learn more about what you can do with MyChart, go to [email or web address].      Signed, Jerel Balding, MD  02/04/2024 6:18 PM    [facility] [address] [place], [place], [place]  [address] Phone: [phone]; Fax: [phone]

## 2024-02-01 NOTE — Patient Instructions (Signed)
 Medication Instructions:  No changes *If you need a refill on your cardiac medications before your next appointment, please call your pharmacy*  Lab Work: None ordered If you have labs (blood work) drawn today and your tests are completely normal, you will receive your results only by: MyChart Message (if you have MyChart) OR A paper copy in the mail If you have any lab test that is abnormal or we need to change your treatment, we will call you to review the results.  Testing/Procedures: Your physician has requested that you have an echocardiogram in July 2026. Echocardiography is a painless test that uses sound waves to create images of your heart. It provides your doctor with information about the size and shape of your heart and how well your heart's chambers and valves are working. This procedure takes approximately one hour. There are no restrictions for this procedure. Please do NOT wear cologne, perfume, aftershave, or lotions (deodorant is allowed). Please arrive 15 minutes prior to your appointment time.  Please note: We ask at that you not bring children with you during ultrasound (echo/ vascular) testing. Due to room size and safety concerns, children are not allowed in the ultrasound rooms during exams. Our front office staff cannot provide observation of children in our lobby area while testing is being conducted. An adult accompanying a patient to their appointment will only be allowed in the ultrasound room at the discretion of the ultrasound technician under special circumstances. We apologize for any inconvenience.   Follow-Up: At [facility], you and your health needs are our priority.  As part of our continuing mission to provide you with exceptional heart care, our providers are all part of one team.  This team includes your primary Cardiologist (physician) and Advanced Practice Providers or APPs (Physician Assistants and Nurse Practitioners) who all work together to  provide you with the care you need, when you need it.  Your next appointment:   1 year(s)  Provider:   Jerel Balding, MD    We recommend signing up for the patient portal called MyChart.  Sign up information is provided on this After Visit Summary.  MyChart is used to connect with patients for Virtual Visits (Telemedicine).  Patients are able to view lab/test results, encounter notes, upcoming appointments, etc.  Non-urgent messages can be sent to your provider as well.   To learn more about what you can do with MyChart, go to [email or web address].

## 2024-02-21 ENCOUNTER — Ambulatory Visit

## 2024-02-23 ENCOUNTER — Ambulatory Visit

## 2024-02-23 DIAGNOSIS — I480 Paroxysmal atrial fibrillation: Secondary | ICD-10-CM

## 2024-02-24 LAB — CUP PACEART REMOTE DEVICE CHECK
Date Time Interrogation Session: 20251124230535
Implantable Pulse Generator Implant Date: 20210111

## 2024-02-26 ENCOUNTER — Ambulatory Visit: Payer: Self-pay | Admitting: Cardiovascular Disease

## 2024-02-26 NOTE — Progress Notes (Signed)
 Remote Loop Recorder Transmission

## 2024-03-25 ENCOUNTER — Ambulatory Visit

## 2024-03-25 DIAGNOSIS — I480 Paroxysmal atrial fibrillation: Secondary | ICD-10-CM | POA: Diagnosis not present

## 2024-03-27 LAB — CUP PACEART REMOTE DEVICE CHECK
Date Time Interrogation Session: 20251225230210
Implantable Pulse Generator Implant Date: 20210111

## 2024-03-28 ENCOUNTER — Ambulatory Visit: Payer: Self-pay | Admitting: Cardiovascular Disease

## 2024-03-28 NOTE — Progress Notes (Signed)
 Remote Loop Recorder Transmission

## 2024-04-25 ENCOUNTER — Ambulatory Visit: Attending: Cardiovascular Disease

## 2024-04-25 DIAGNOSIS — I480 Paroxysmal atrial fibrillation: Secondary | ICD-10-CM

## 2024-04-25 LAB — CUP PACEART REMOTE DEVICE CHECK
Date Time Interrogation Session: 20260125230223
Implantable Pulse Generator Implant Date: 20210111

## 2024-04-26 ENCOUNTER — Ambulatory Visit: Payer: Self-pay | Admitting: Cardiovascular Disease

## 2024-05-03 NOTE — Progress Notes (Signed)
 Remote Loop Recorder Transmission

## 2024-05-26 ENCOUNTER — Ambulatory Visit

## 2024-06-26 ENCOUNTER — Ambulatory Visit

## 2024-07-27 ENCOUNTER — Ambulatory Visit

## 2024-08-27 ENCOUNTER — Ambulatory Visit

## 2024-09-27 ENCOUNTER — Ambulatory Visit

## 2024-10-28 ENCOUNTER — Ambulatory Visit

## 2024-11-28 ENCOUNTER — Ambulatory Visit

## 2024-12-29 ENCOUNTER — Ambulatory Visit

## 2025-01-29 ENCOUNTER — Ambulatory Visit
